# Patient Record
Sex: Female | Born: 1935 | Race: White | Hispanic: No | Smoking: Former smoker
Health system: Southern US, Community
[De-identification: ages and names within clinical notes are randomized; demographics above are authoritative.]

## PROBLEM LIST (undated history)

## (undated) DIAGNOSIS — E039 Hypothyroidism, unspecified: Secondary | ICD-10-CM

## (undated) DIAGNOSIS — M1990 Unspecified osteoarthritis, unspecified site: Secondary | ICD-10-CM

## (undated) DIAGNOSIS — C801 Malignant (primary) neoplasm, unspecified: Secondary | ICD-10-CM

## (undated) DIAGNOSIS — K219 Gastro-esophageal reflux disease without esophagitis: Secondary | ICD-10-CM

## (undated) DIAGNOSIS — E079 Disorder of thyroid, unspecified: Secondary | ICD-10-CM

## (undated) DIAGNOSIS — F32A Depression, unspecified: Secondary | ICD-10-CM

## (undated) DIAGNOSIS — I10 Essential (primary) hypertension: Secondary | ICD-10-CM

## (undated) DIAGNOSIS — Z972 Presence of dental prosthetic device (complete) (partial): Secondary | ICD-10-CM

## (undated) DIAGNOSIS — R519 Headache, unspecified: Secondary | ICD-10-CM

## (undated) DIAGNOSIS — F419 Anxiety disorder, unspecified: Secondary | ICD-10-CM

## (undated) DIAGNOSIS — I4891 Unspecified atrial fibrillation: Secondary | ICD-10-CM

## (undated) DIAGNOSIS — R51 Headache: Secondary | ICD-10-CM

## (undated) DIAGNOSIS — F329 Major depressive disorder, single episode, unspecified: Secondary | ICD-10-CM

## (undated) HISTORY — DX: Depression, unspecified: F32.A

## (undated) HISTORY — PX: MASTECTOMY: SHX3

## (undated) HISTORY — PX: TOTAL KNEE ARTHROPLASTY: SHX125

## (undated) HISTORY — PX: VAGINAL HYSTERECTOMY: SUR661

## (undated) HISTORY — DX: Gastro-esophageal reflux disease without esophagitis: K21.9

## (undated) HISTORY — PX: TONSILLECTOMY: SUR1361

## (undated) HISTORY — PX: CHOLECYSTECTOMY: SHX55

## (undated) HISTORY — PX: ROTATOR CUFF REPAIR: SHX139

## (undated) HISTORY — DX: Major depressive disorder, single episode, unspecified: F32.9

## (undated) HISTORY — DX: Disorder of thyroid, unspecified: E07.9

## (undated) HISTORY — PX: CATARACT EXTRACTION: SUR2

---

## 1989-03-14 DIAGNOSIS — C801 Malignant (primary) neoplasm, unspecified: Secondary | ICD-10-CM

## 1989-03-14 HISTORY — DX: Malignant (primary) neoplasm, unspecified: C80.1

## 1989-03-14 HISTORY — PX: BREAST SURGERY: SHX581

## 1997-05-05 ENCOUNTER — Ambulatory Visit: Admission: RE | Admit: 1997-05-05 | Discharge: 1997-05-05 | Payer: Self-pay | Admitting: Obstetrics & Gynecology

## 2005-07-25 ENCOUNTER — Ambulatory Visit: Payer: Self-pay | Admitting: Gastroenterology

## 2006-04-18 ENCOUNTER — Ambulatory Visit: Payer: Self-pay | Admitting: Gastroenterology

## 2006-06-22 ENCOUNTER — Ambulatory Visit: Payer: Self-pay | Admitting: Gastroenterology

## 2006-11-01 ENCOUNTER — Ambulatory Visit: Payer: Self-pay | Admitting: Family Medicine

## 2007-10-15 ENCOUNTER — Ambulatory Visit: Payer: Self-pay | Admitting: Gastroenterology

## 2007-10-24 ENCOUNTER — Ambulatory Visit: Payer: Self-pay | Admitting: Unknown Physician Specialty

## 2007-11-01 ENCOUNTER — Ambulatory Visit: Payer: Self-pay

## 2007-11-01 ENCOUNTER — Other Ambulatory Visit: Payer: Self-pay

## 2009-08-13 ENCOUNTER — Ambulatory Visit: Payer: Self-pay | Admitting: Family Medicine

## 2009-09-16 ENCOUNTER — Ambulatory Visit: Payer: Self-pay | Admitting: Family Medicine

## 2009-09-17 ENCOUNTER — Ambulatory Visit: Payer: Self-pay | Admitting: Surgery

## 2009-09-18 ENCOUNTER — Inpatient Hospital Stay: Payer: Self-pay | Admitting: Surgery

## 2009-09-22 LAB — PATHOLOGY REPORT

## 2010-01-25 ENCOUNTER — Ambulatory Visit: Payer: Self-pay

## 2010-02-02 ENCOUNTER — Ambulatory Visit: Payer: Self-pay | Admitting: Unknown Physician Specialty

## 2010-06-20 ENCOUNTER — Ambulatory Visit: Payer: Self-pay | Admitting: Family Medicine

## 2010-08-30 ENCOUNTER — Ambulatory Visit: Payer: Self-pay | Admitting: Family Medicine

## 2011-03-15 HISTORY — PX: COLONOSCOPY: SHX174

## 2011-03-18 ENCOUNTER — Ambulatory Visit: Payer: Self-pay | Admitting: Unknown Physician Specialty

## 2011-03-18 DIAGNOSIS — M5020 Other cervical disc displacement, unspecified cervical region: Secondary | ICD-10-CM | POA: Diagnosis not present

## 2011-03-18 DIAGNOSIS — M47812 Spondylosis without myelopathy or radiculopathy, cervical region: Secondary | ICD-10-CM | POA: Diagnosis not present

## 2011-03-18 DIAGNOSIS — M5030 Other cervical disc degeneration, unspecified cervical region: Secondary | ICD-10-CM | POA: Diagnosis not present

## 2011-03-21 DIAGNOSIS — M542 Cervicalgia: Secondary | ICD-10-CM | POA: Diagnosis not present

## 2011-03-23 DIAGNOSIS — M6281 Muscle weakness (generalized): Secondary | ICD-10-CM | POA: Diagnosis not present

## 2011-03-23 DIAGNOSIS — M542 Cervicalgia: Secondary | ICD-10-CM | POA: Diagnosis not present

## 2011-03-25 DIAGNOSIS — M542 Cervicalgia: Secondary | ICD-10-CM | POA: Diagnosis not present

## 2011-03-28 DIAGNOSIS — M542 Cervicalgia: Secondary | ICD-10-CM | POA: Diagnosis not present

## 2011-03-30 DIAGNOSIS — M542 Cervicalgia: Secondary | ICD-10-CM | POA: Diagnosis not present

## 2011-04-04 DIAGNOSIS — M542 Cervicalgia: Secondary | ICD-10-CM | POA: Diagnosis not present

## 2011-04-07 DIAGNOSIS — M6281 Muscle weakness (generalized): Secondary | ICD-10-CM | POA: Diagnosis not present

## 2011-04-07 DIAGNOSIS — M542 Cervicalgia: Secondary | ICD-10-CM | POA: Diagnosis not present

## 2011-04-08 DIAGNOSIS — I10 Essential (primary) hypertension: Secondary | ICD-10-CM | POA: Diagnosis not present

## 2011-04-08 DIAGNOSIS — K2970 Gastritis, unspecified, without bleeding: Secondary | ICD-10-CM | POA: Diagnosis not present

## 2011-04-11 DIAGNOSIS — M6281 Muscle weakness (generalized): Secondary | ICD-10-CM | POA: Diagnosis not present

## 2011-04-11 DIAGNOSIS — M542 Cervicalgia: Secondary | ICD-10-CM | POA: Diagnosis not present

## 2011-04-14 DIAGNOSIS — M6281 Muscle weakness (generalized): Secondary | ICD-10-CM | POA: Diagnosis not present

## 2011-04-14 DIAGNOSIS — M542 Cervicalgia: Secondary | ICD-10-CM | POA: Diagnosis not present

## 2011-04-19 DIAGNOSIS — M542 Cervicalgia: Secondary | ICD-10-CM | POA: Diagnosis not present

## 2011-04-19 DIAGNOSIS — M6281 Muscle weakness (generalized): Secondary | ICD-10-CM | POA: Diagnosis not present

## 2011-04-20 DIAGNOSIS — L570 Actinic keratosis: Secondary | ICD-10-CM | POA: Diagnosis not present

## 2011-04-20 DIAGNOSIS — B079 Viral wart, unspecified: Secondary | ICD-10-CM | POA: Diagnosis not present

## 2011-04-20 DIAGNOSIS — D485 Neoplasm of uncertain behavior of skin: Secondary | ICD-10-CM | POA: Diagnosis not present

## 2011-04-20 DIAGNOSIS — L820 Inflamed seborrheic keratosis: Secondary | ICD-10-CM | POA: Diagnosis not present

## 2011-04-25 DIAGNOSIS — Z961 Presence of intraocular lens: Secondary | ICD-10-CM | POA: Diagnosis not present

## 2011-06-08 DIAGNOSIS — M79609 Pain in unspecified limb: Secondary | ICD-10-CM | POA: Diagnosis not present

## 2011-06-08 DIAGNOSIS — M2010 Hallux valgus (acquired), unspecified foot: Secondary | ICD-10-CM | POA: Diagnosis not present

## 2011-07-25 DIAGNOSIS — IMO0002 Reserved for concepts with insufficient information to code with codable children: Secondary | ICD-10-CM | POA: Diagnosis not present

## 2011-07-25 DIAGNOSIS — M1710 Unilateral primary osteoarthritis, unspecified knee: Secondary | ICD-10-CM | POA: Diagnosis not present

## 2011-08-04 ENCOUNTER — Ambulatory Visit: Payer: Self-pay | Admitting: Orthopedic Surgery

## 2011-08-04 DIAGNOSIS — M25569 Pain in unspecified knee: Secondary | ICD-10-CM | POA: Diagnosis not present

## 2011-09-02 DIAGNOSIS — J019 Acute sinusitis, unspecified: Secondary | ICD-10-CM | POA: Diagnosis not present

## 2011-09-05 ENCOUNTER — Ambulatory Visit: Payer: Self-pay | Admitting: Orthopedic Surgery

## 2011-09-05 DIAGNOSIS — I119 Hypertensive heart disease without heart failure: Secondary | ICD-10-CM | POA: Diagnosis not present

## 2011-09-05 LAB — CBC
HCT: 39.6 % (ref 35.0–47.0)
HGB: 13.4 g/dL (ref 12.0–16.0)
MCH: 29.4 pg (ref 26.0–34.0)
MCV: 87 fL (ref 80–100)
Platelet: 154 10*3/uL (ref 150–440)
RBC: 4.55 10*6/uL (ref 3.80–5.20)
RDW: 15.4 % — ABNORMAL HIGH (ref 11.5–14.5)
WBC: 6.5 10*3/uL (ref 3.6–11.0)

## 2011-09-05 LAB — BASIC METABOLIC PANEL
BUN: 21 mg/dL — ABNORMAL HIGH (ref 7–18)
Chloride: 102 mmol/L (ref 98–107)
Co2: 29 mmol/L (ref 21–32)
Creatinine: 0.98 mg/dL (ref 0.60–1.30)
EGFR (African American): >60
EGFR (Non-African Amer.): 56 — ABNORMAL LOW
Osmolality: 277 (ref 275–301)
Potassium: 3.8 mmol/L (ref 3.5–5.1)

## 2011-09-05 LAB — SEDIMENTATION RATE: Erythrocyte Sed Rate: 6 mm/hr (ref 0–30)

## 2011-09-08 ENCOUNTER — Inpatient Hospital Stay: Payer: Self-pay

## 2011-09-08 DIAGNOSIS — G8918 Other acute postprocedural pain: Secondary | ICD-10-CM | POA: Diagnosis not present

## 2011-09-08 DIAGNOSIS — K279 Peptic ulcer, site unspecified, unspecified as acute or chronic, without hemorrhage or perforation: Secondary | ICD-10-CM | POA: Diagnosis present

## 2011-09-08 DIAGNOSIS — J309 Allergic rhinitis, unspecified: Secondary | ICD-10-CM | POA: Diagnosis present

## 2011-09-08 DIAGNOSIS — Z803 Family history of malignant neoplasm of breast: Secondary | ICD-10-CM | POA: Diagnosis not present

## 2011-09-08 DIAGNOSIS — Z96659 Presence of unspecified artificial knee joint: Secondary | ICD-10-CM | POA: Diagnosis not present

## 2011-09-08 DIAGNOSIS — R011 Cardiac murmur, unspecified: Secondary | ICD-10-CM | POA: Diagnosis present

## 2011-09-08 DIAGNOSIS — M25569 Pain in unspecified knee: Secondary | ICD-10-CM | POA: Diagnosis not present

## 2011-09-08 DIAGNOSIS — Z886 Allergy status to analgesic agent status: Secondary | ICD-10-CM | POA: Diagnosis not present

## 2011-09-08 DIAGNOSIS — Z79899 Other long term (current) drug therapy: Secondary | ICD-10-CM | POA: Diagnosis not present

## 2011-09-08 DIAGNOSIS — Z87891 Personal history of nicotine dependence: Secondary | ICD-10-CM | POA: Diagnosis not present

## 2011-09-08 DIAGNOSIS — M19079 Primary osteoarthritis, unspecified ankle and foot: Secondary | ICD-10-CM | POA: Diagnosis not present

## 2011-09-08 DIAGNOSIS — Z881 Allergy status to other antibiotic agents status: Secondary | ICD-10-CM | POA: Diagnosis not present

## 2011-09-08 DIAGNOSIS — M7120 Synovial cyst of popliteal space [Baker], unspecified knee: Secondary | ICD-10-CM | POA: Diagnosis present

## 2011-09-08 DIAGNOSIS — Z853 Personal history of malignant neoplasm of breast: Secondary | ICD-10-CM | POA: Diagnosis not present

## 2011-09-08 DIAGNOSIS — M1710 Unilateral primary osteoarthritis, unspecified knee: Secondary | ICD-10-CM | POA: Diagnosis present

## 2011-09-08 DIAGNOSIS — Z880 Allergy status to penicillin: Secondary | ICD-10-CM | POA: Diagnosis not present

## 2011-09-08 DIAGNOSIS — K2970 Gastritis, unspecified, without bleeding: Secondary | ICD-10-CM | POA: Diagnosis present

## 2011-09-08 DIAGNOSIS — E669 Obesity, unspecified: Secondary | ICD-10-CM | POA: Diagnosis present

## 2011-09-08 DIAGNOSIS — Z8249 Family history of ischemic heart disease and other diseases of the circulatory system: Secondary | ICD-10-CM | POA: Diagnosis not present

## 2011-09-08 DIAGNOSIS — Z888 Allergy status to other drugs, medicaments and biological substances status: Secondary | ICD-10-CM | POA: Diagnosis not present

## 2011-09-08 DIAGNOSIS — Z471 Aftercare following joint replacement surgery: Secondary | ICD-10-CM | POA: Diagnosis not present

## 2011-09-08 DIAGNOSIS — IMO0002 Reserved for concepts with insufficient information to code with codable children: Secondary | ICD-10-CM | POA: Diagnosis not present

## 2011-09-08 DIAGNOSIS — D62 Acute posthemorrhagic anemia: Secondary | ICD-10-CM | POA: Diagnosis not present

## 2011-09-08 DIAGNOSIS — M1990 Unspecified osteoarthritis, unspecified site: Secondary | ICD-10-CM | POA: Diagnosis not present

## 2011-09-08 DIAGNOSIS — E785 Hyperlipidemia, unspecified: Secondary | ICD-10-CM | POA: Diagnosis present

## 2011-09-08 DIAGNOSIS — I10 Essential (primary) hypertension: Secondary | ICD-10-CM | POA: Diagnosis present

## 2011-09-08 DIAGNOSIS — D369 Benign neoplasm, unspecified site: Secondary | ICD-10-CM | POA: Diagnosis present

## 2011-09-09 LAB — BASIC METABOLIC PANEL
Anion Gap: 4 — ABNORMAL LOW (ref 7–16)
Calcium, Total: 8.7 mg/dL (ref 8.5–10.1)
Co2: 30 mmol/L (ref 21–32)
Creatinine: 1.05 mg/dL (ref 0.60–1.30)
EGFR (Non-African Amer.): 52 — ABNORMAL LOW
Osmolality: 283 (ref 275–301)
Sodium: 141 mmol/L (ref 136–145)

## 2011-09-09 LAB — PLATELET COUNT: Platelet: 119 10*3/uL — ABNORMAL LOW (ref 150–440)

## 2011-09-09 LAB — HEMOGLOBIN: HGB: 11 g/dL — ABNORMAL LOW (ref 12.0–16.0)

## 2011-09-12 DIAGNOSIS — Z471 Aftercare following joint replacement surgery: Secondary | ICD-10-CM | POA: Diagnosis not present

## 2011-09-12 DIAGNOSIS — R609 Edema, unspecified: Secondary | ICD-10-CM | POA: Diagnosis not present

## 2011-09-12 DIAGNOSIS — IMO0001 Reserved for inherently not codable concepts without codable children: Secondary | ICD-10-CM | POA: Diagnosis not present

## 2011-09-12 DIAGNOSIS — I10 Essential (primary) hypertension: Secondary | ICD-10-CM | POA: Diagnosis not present

## 2011-09-12 DIAGNOSIS — Z7901 Long term (current) use of anticoagulants: Secondary | ICD-10-CM | POA: Diagnosis not present

## 2011-09-12 LAB — PATHOLOGY REPORT

## 2011-09-13 DIAGNOSIS — R609 Edema, unspecified: Secondary | ICD-10-CM | POA: Diagnosis not present

## 2011-09-13 DIAGNOSIS — Z471 Aftercare following joint replacement surgery: Secondary | ICD-10-CM | POA: Diagnosis not present

## 2011-09-13 DIAGNOSIS — I10 Essential (primary) hypertension: Secondary | ICD-10-CM | POA: Diagnosis not present

## 2011-09-13 DIAGNOSIS — IMO0001 Reserved for inherently not codable concepts without codable children: Secondary | ICD-10-CM | POA: Diagnosis not present

## 2011-09-13 DIAGNOSIS — Z7901 Long term (current) use of anticoagulants: Secondary | ICD-10-CM | POA: Diagnosis not present

## 2011-09-14 DIAGNOSIS — IMO0001 Reserved for inherently not codable concepts without codable children: Secondary | ICD-10-CM | POA: Diagnosis not present

## 2011-09-14 DIAGNOSIS — R609 Edema, unspecified: Secondary | ICD-10-CM | POA: Diagnosis not present

## 2011-09-14 DIAGNOSIS — Z471 Aftercare following joint replacement surgery: Secondary | ICD-10-CM | POA: Diagnosis not present

## 2011-09-14 DIAGNOSIS — I10 Essential (primary) hypertension: Secondary | ICD-10-CM | POA: Diagnosis not present

## 2011-09-14 DIAGNOSIS — Z7901 Long term (current) use of anticoagulants: Secondary | ICD-10-CM | POA: Diagnosis not present

## 2011-09-16 DIAGNOSIS — I10 Essential (primary) hypertension: Secondary | ICD-10-CM | POA: Diagnosis not present

## 2011-09-16 DIAGNOSIS — Z471 Aftercare following joint replacement surgery: Secondary | ICD-10-CM | POA: Diagnosis not present

## 2011-09-16 DIAGNOSIS — R609 Edema, unspecified: Secondary | ICD-10-CM | POA: Diagnosis not present

## 2011-09-16 DIAGNOSIS — Z7901 Long term (current) use of anticoagulants: Secondary | ICD-10-CM | POA: Diagnosis not present

## 2011-09-16 DIAGNOSIS — IMO0001 Reserved for inherently not codable concepts without codable children: Secondary | ICD-10-CM | POA: Diagnosis not present

## 2011-09-19 DIAGNOSIS — R609 Edema, unspecified: Secondary | ICD-10-CM | POA: Diagnosis not present

## 2011-09-19 DIAGNOSIS — Z471 Aftercare following joint replacement surgery: Secondary | ICD-10-CM | POA: Diagnosis not present

## 2011-09-19 DIAGNOSIS — IMO0001 Reserved for inherently not codable concepts without codable children: Secondary | ICD-10-CM | POA: Diagnosis not present

## 2011-09-19 DIAGNOSIS — I10 Essential (primary) hypertension: Secondary | ICD-10-CM | POA: Diagnosis not present

## 2011-09-19 DIAGNOSIS — Z7901 Long term (current) use of anticoagulants: Secondary | ICD-10-CM | POA: Diagnosis not present

## 2011-09-20 DIAGNOSIS — R609 Edema, unspecified: Secondary | ICD-10-CM | POA: Diagnosis not present

## 2011-09-20 DIAGNOSIS — Z7901 Long term (current) use of anticoagulants: Secondary | ICD-10-CM | POA: Diagnosis not present

## 2011-09-20 DIAGNOSIS — Z471 Aftercare following joint replacement surgery: Secondary | ICD-10-CM | POA: Diagnosis not present

## 2011-09-20 DIAGNOSIS — I10 Essential (primary) hypertension: Secondary | ICD-10-CM | POA: Diagnosis not present

## 2011-09-20 DIAGNOSIS — IMO0001 Reserved for inherently not codable concepts without codable children: Secondary | ICD-10-CM | POA: Diagnosis not present

## 2011-09-21 DIAGNOSIS — I10 Essential (primary) hypertension: Secondary | ICD-10-CM | POA: Diagnosis not present

## 2011-09-21 DIAGNOSIS — R609 Edema, unspecified: Secondary | ICD-10-CM | POA: Diagnosis not present

## 2011-09-21 DIAGNOSIS — Z471 Aftercare following joint replacement surgery: Secondary | ICD-10-CM | POA: Diagnosis not present

## 2011-09-21 DIAGNOSIS — Z7901 Long term (current) use of anticoagulants: Secondary | ICD-10-CM | POA: Diagnosis not present

## 2011-09-21 DIAGNOSIS — IMO0001 Reserved for inherently not codable concepts without codable children: Secondary | ICD-10-CM | POA: Diagnosis not present

## 2011-09-22 DIAGNOSIS — Z471 Aftercare following joint replacement surgery: Secondary | ICD-10-CM | POA: Diagnosis not present

## 2011-09-22 DIAGNOSIS — R609 Edema, unspecified: Secondary | ICD-10-CM | POA: Diagnosis not present

## 2011-09-22 DIAGNOSIS — IMO0001 Reserved for inherently not codable concepts without codable children: Secondary | ICD-10-CM | POA: Diagnosis not present

## 2011-09-22 DIAGNOSIS — Z7901 Long term (current) use of anticoagulants: Secondary | ICD-10-CM | POA: Diagnosis not present

## 2011-09-22 DIAGNOSIS — I10 Essential (primary) hypertension: Secondary | ICD-10-CM | POA: Diagnosis not present

## 2011-09-23 DIAGNOSIS — IMO0002 Reserved for concepts with insufficient information to code with codable children: Secondary | ICD-10-CM | POA: Diagnosis not present

## 2011-09-23 DIAGNOSIS — M1710 Unilateral primary osteoarthritis, unspecified knee: Secondary | ICD-10-CM | POA: Diagnosis not present

## 2011-09-26 DIAGNOSIS — M25669 Stiffness of unspecified knee, not elsewhere classified: Secondary | ICD-10-CM | POA: Diagnosis not present

## 2011-09-26 DIAGNOSIS — M6281 Muscle weakness (generalized): Secondary | ICD-10-CM | POA: Diagnosis not present

## 2011-09-26 DIAGNOSIS — Z96659 Presence of unspecified artificial knee joint: Secondary | ICD-10-CM | POA: Diagnosis not present

## 2011-09-26 DIAGNOSIS — M25569 Pain in unspecified knee: Secondary | ICD-10-CM | POA: Diagnosis not present

## 2011-09-27 DIAGNOSIS — M6281 Muscle weakness (generalized): Secondary | ICD-10-CM | POA: Diagnosis not present

## 2011-09-27 DIAGNOSIS — Z96659 Presence of unspecified artificial knee joint: Secondary | ICD-10-CM | POA: Diagnosis not present

## 2011-09-27 DIAGNOSIS — M25569 Pain in unspecified knee: Secondary | ICD-10-CM | POA: Diagnosis not present

## 2011-09-27 DIAGNOSIS — M25669 Stiffness of unspecified knee, not elsewhere classified: Secondary | ICD-10-CM | POA: Diagnosis not present

## 2011-09-29 DIAGNOSIS — M25669 Stiffness of unspecified knee, not elsewhere classified: Secondary | ICD-10-CM | POA: Diagnosis not present

## 2011-09-29 DIAGNOSIS — M6281 Muscle weakness (generalized): Secondary | ICD-10-CM | POA: Diagnosis not present

## 2011-09-29 DIAGNOSIS — Z96659 Presence of unspecified artificial knee joint: Secondary | ICD-10-CM | POA: Diagnosis not present

## 2011-09-29 DIAGNOSIS — M25569 Pain in unspecified knee: Secondary | ICD-10-CM | POA: Diagnosis not present

## 2011-10-03 DIAGNOSIS — M6281 Muscle weakness (generalized): Secondary | ICD-10-CM | POA: Diagnosis not present

## 2011-10-03 DIAGNOSIS — M25569 Pain in unspecified knee: Secondary | ICD-10-CM | POA: Diagnosis not present

## 2011-10-03 DIAGNOSIS — M25669 Stiffness of unspecified knee, not elsewhere classified: Secondary | ICD-10-CM | POA: Diagnosis not present

## 2011-10-03 DIAGNOSIS — Z96659 Presence of unspecified artificial knee joint: Secondary | ICD-10-CM | POA: Diagnosis not present

## 2011-10-05 DIAGNOSIS — M25669 Stiffness of unspecified knee, not elsewhere classified: Secondary | ICD-10-CM | POA: Diagnosis not present

## 2011-10-05 DIAGNOSIS — M6281 Muscle weakness (generalized): Secondary | ICD-10-CM | POA: Diagnosis not present

## 2011-10-05 DIAGNOSIS — M25569 Pain in unspecified knee: Secondary | ICD-10-CM | POA: Diagnosis not present

## 2011-10-05 DIAGNOSIS — Z96659 Presence of unspecified artificial knee joint: Secondary | ICD-10-CM | POA: Diagnosis not present

## 2011-10-10 DIAGNOSIS — Z96659 Presence of unspecified artificial knee joint: Secondary | ICD-10-CM | POA: Diagnosis not present

## 2011-10-10 DIAGNOSIS — M25569 Pain in unspecified knee: Secondary | ICD-10-CM | POA: Diagnosis not present

## 2011-10-10 DIAGNOSIS — M6281 Muscle weakness (generalized): Secondary | ICD-10-CM | POA: Diagnosis not present

## 2011-10-10 DIAGNOSIS — M25669 Stiffness of unspecified knee, not elsewhere classified: Secondary | ICD-10-CM | POA: Diagnosis not present

## 2011-10-13 DIAGNOSIS — M6281 Muscle weakness (generalized): Secondary | ICD-10-CM | POA: Diagnosis not present

## 2011-10-13 DIAGNOSIS — M25669 Stiffness of unspecified knee, not elsewhere classified: Secondary | ICD-10-CM | POA: Diagnosis not present

## 2011-10-13 DIAGNOSIS — M25569 Pain in unspecified knee: Secondary | ICD-10-CM | POA: Diagnosis not present

## 2011-10-13 DIAGNOSIS — Z96659 Presence of unspecified artificial knee joint: Secondary | ICD-10-CM | POA: Diagnosis not present

## 2011-12-02 DIAGNOSIS — R51 Headache: Secondary | ICD-10-CM | POA: Diagnosis not present

## 2011-12-02 DIAGNOSIS — J301 Allergic rhinitis due to pollen: Secondary | ICD-10-CM | POA: Diagnosis not present

## 2011-12-02 DIAGNOSIS — R439 Unspecified disturbances of smell and taste: Secondary | ICD-10-CM | POA: Diagnosis not present

## 2011-12-09 ENCOUNTER — Ambulatory Visit: Payer: Self-pay | Admitting: Otolaryngology

## 2011-12-09 DIAGNOSIS — R439 Unspecified disturbances of smell and taste: Secondary | ICD-10-CM | POA: Diagnosis not present

## 2011-12-09 LAB — CREATININE, SERUM
EGFR (African American): 58 — ABNORMAL LOW
EGFR (Non-African Amer.): 50 — ABNORMAL LOW

## 2011-12-29 DIAGNOSIS — Z23 Encounter for immunization: Secondary | ICD-10-CM | POA: Diagnosis not present

## 2012-01-03 DIAGNOSIS — K2270 Barrett's esophagus without dysplasia: Secondary | ICD-10-CM | POA: Diagnosis not present

## 2012-01-03 DIAGNOSIS — Z86010 Personal history of colonic polyps: Secondary | ICD-10-CM | POA: Diagnosis not present

## 2012-02-06 DIAGNOSIS — E785 Hyperlipidemia, unspecified: Secondary | ICD-10-CM | POA: Diagnosis not present

## 2012-02-06 DIAGNOSIS — L659 Nonscarring hair loss, unspecified: Secondary | ICD-10-CM | POA: Diagnosis not present

## 2012-02-06 DIAGNOSIS — J019 Acute sinusitis, unspecified: Secondary | ICD-10-CM | POA: Diagnosis not present

## 2012-02-06 DIAGNOSIS — I10 Essential (primary) hypertension: Secondary | ICD-10-CM | POA: Diagnosis not present

## 2012-02-24 ENCOUNTER — Ambulatory Visit: Payer: Self-pay | Admitting: Gastroenterology

## 2012-02-24 DIAGNOSIS — Z7982 Long term (current) use of aspirin: Secondary | ICD-10-CM | POA: Diagnosis not present

## 2012-02-24 DIAGNOSIS — Z79899 Other long term (current) drug therapy: Secondary | ICD-10-CM | POA: Diagnosis not present

## 2012-02-24 DIAGNOSIS — K219 Gastro-esophageal reflux disease without esophagitis: Secondary | ICD-10-CM | POA: Diagnosis not present

## 2012-02-24 DIAGNOSIS — Q438 Other specified congenital malformations of intestine: Secondary | ICD-10-CM | POA: Diagnosis not present

## 2012-02-24 DIAGNOSIS — K2960 Other gastritis without bleeding: Secondary | ICD-10-CM | POA: Diagnosis not present

## 2012-02-24 DIAGNOSIS — Z96659 Presence of unspecified artificial knee joint: Secondary | ICD-10-CM | POA: Diagnosis not present

## 2012-02-24 DIAGNOSIS — Z09 Encounter for follow-up examination after completed treatment for conditions other than malignant neoplasm: Secondary | ICD-10-CM | POA: Diagnosis not present

## 2012-02-24 DIAGNOSIS — D126 Benign neoplasm of colon, unspecified: Secondary | ICD-10-CM | POA: Diagnosis not present

## 2012-02-24 DIAGNOSIS — Z880 Allergy status to penicillin: Secondary | ICD-10-CM | POA: Diagnosis not present

## 2012-02-24 DIAGNOSIS — Z886 Allergy status to analgesic agent status: Secondary | ICD-10-CM | POA: Diagnosis not present

## 2012-02-24 DIAGNOSIS — K562 Volvulus: Secondary | ICD-10-CM | POA: Diagnosis not present

## 2012-02-24 DIAGNOSIS — Z86010 Personal history of colonic polyps: Secondary | ICD-10-CM | POA: Diagnosis not present

## 2012-02-24 DIAGNOSIS — K2940 Chronic atrophic gastritis without bleeding: Secondary | ICD-10-CM | POA: Diagnosis not present

## 2012-02-24 DIAGNOSIS — K5730 Diverticulosis of large intestine without perforation or abscess without bleeding: Secondary | ICD-10-CM | POA: Diagnosis not present

## 2012-02-24 DIAGNOSIS — I10 Essential (primary) hypertension: Secondary | ICD-10-CM | POA: Diagnosis not present

## 2012-02-24 DIAGNOSIS — K2270 Barrett's esophagus without dysplasia: Secondary | ICD-10-CM | POA: Diagnosis not present

## 2012-02-24 LAB — HM COLONOSCOPY

## 2012-02-27 LAB — PATHOLOGY REPORT

## 2012-04-05 DIAGNOSIS — D126 Benign neoplasm of colon, unspecified: Secondary | ICD-10-CM | POA: Diagnosis not present

## 2012-04-05 DIAGNOSIS — K2270 Barrett's esophagus without dysplasia: Secondary | ICD-10-CM | POA: Diagnosis not present

## 2012-05-14 DIAGNOSIS — Z96659 Presence of unspecified artificial knee joint: Secondary | ICD-10-CM | POA: Diagnosis not present

## 2012-07-31 DIAGNOSIS — R1013 Epigastric pain: Secondary | ICD-10-CM | POA: Diagnosis not present

## 2012-07-31 DIAGNOSIS — K2270 Barrett's esophagus without dysplasia: Secondary | ICD-10-CM | POA: Diagnosis not present

## 2012-07-31 DIAGNOSIS — J0100 Acute maxillary sinusitis, unspecified: Secondary | ICD-10-CM | POA: Diagnosis not present

## 2012-08-23 DIAGNOSIS — E785 Hyperlipidemia, unspecified: Secondary | ICD-10-CM | POA: Diagnosis not present

## 2012-12-12 DIAGNOSIS — E785 Hyperlipidemia, unspecified: Secondary | ICD-10-CM | POA: Diagnosis not present

## 2012-12-12 DIAGNOSIS — Z23 Encounter for immunization: Secondary | ICD-10-CM | POA: Diagnosis not present

## 2013-01-30 DIAGNOSIS — L906 Striae atrophicae: Secondary | ICD-10-CM | POA: Diagnosis not present

## 2013-01-30 DIAGNOSIS — L570 Actinic keratosis: Secondary | ICD-10-CM | POA: Diagnosis not present

## 2013-01-30 DIAGNOSIS — Z1283 Encounter for screening for malignant neoplasm of skin: Secondary | ICD-10-CM | POA: Diagnosis not present

## 2013-01-30 DIAGNOSIS — L909 Atrophic disorder of skin, unspecified: Secondary | ICD-10-CM | POA: Diagnosis not present

## 2013-01-30 DIAGNOSIS — D214 Benign neoplasm of connective and other soft tissue of abdomen: Secondary | ICD-10-CM | POA: Diagnosis not present

## 2013-06-25 DIAGNOSIS — E785 Hyperlipidemia, unspecified: Secondary | ICD-10-CM | POA: Diagnosis not present

## 2013-07-01 DIAGNOSIS — J209 Acute bronchitis, unspecified: Secondary | ICD-10-CM | POA: Diagnosis not present

## 2013-07-01 DIAGNOSIS — B3789 Other sites of candidiasis: Secondary | ICD-10-CM | POA: Diagnosis not present

## 2013-07-01 DIAGNOSIS — J019 Acute sinusitis, unspecified: Secondary | ICD-10-CM | POA: Diagnosis not present

## 2013-08-09 DIAGNOSIS — Z0000 Encounter for general adult medical examination without abnormal findings: Secondary | ICD-10-CM | POA: Diagnosis not present

## 2013-08-13 DIAGNOSIS — M955 Acquired deformity of pelvis: Secondary | ICD-10-CM | POA: Diagnosis not present

## 2013-08-13 DIAGNOSIS — IMO0002 Reserved for concepts with insufficient information to code with codable children: Secondary | ICD-10-CM | POA: Diagnosis not present

## 2013-08-13 DIAGNOSIS — M999 Biomechanical lesion, unspecified: Secondary | ICD-10-CM | POA: Diagnosis not present

## 2013-08-16 DIAGNOSIS — M955 Acquired deformity of pelvis: Secondary | ICD-10-CM | POA: Diagnosis not present

## 2013-08-16 DIAGNOSIS — M999 Biomechanical lesion, unspecified: Secondary | ICD-10-CM | POA: Diagnosis not present

## 2013-08-16 DIAGNOSIS — IMO0002 Reserved for concepts with insufficient information to code with codable children: Secondary | ICD-10-CM | POA: Diagnosis not present

## 2013-08-19 DIAGNOSIS — M955 Acquired deformity of pelvis: Secondary | ICD-10-CM | POA: Diagnosis not present

## 2013-08-19 DIAGNOSIS — IMO0002 Reserved for concepts with insufficient information to code with codable children: Secondary | ICD-10-CM | POA: Diagnosis not present

## 2013-08-19 DIAGNOSIS — M999 Biomechanical lesion, unspecified: Secondary | ICD-10-CM | POA: Diagnosis not present

## 2013-08-21 DIAGNOSIS — M999 Biomechanical lesion, unspecified: Secondary | ICD-10-CM | POA: Diagnosis not present

## 2013-08-21 DIAGNOSIS — M955 Acquired deformity of pelvis: Secondary | ICD-10-CM | POA: Diagnosis not present

## 2013-08-21 DIAGNOSIS — IMO0002 Reserved for concepts with insufficient information to code with codable children: Secondary | ICD-10-CM | POA: Diagnosis not present

## 2013-08-23 DIAGNOSIS — M999 Biomechanical lesion, unspecified: Secondary | ICD-10-CM | POA: Diagnosis not present

## 2013-08-23 DIAGNOSIS — IMO0002 Reserved for concepts with insufficient information to code with codable children: Secondary | ICD-10-CM | POA: Diagnosis not present

## 2013-08-23 DIAGNOSIS — M955 Acquired deformity of pelvis: Secondary | ICD-10-CM | POA: Diagnosis not present

## 2013-09-03 DIAGNOSIS — IMO0002 Reserved for concepts with insufficient information to code with codable children: Secondary | ICD-10-CM | POA: Diagnosis not present

## 2013-09-03 DIAGNOSIS — M999 Biomechanical lesion, unspecified: Secondary | ICD-10-CM | POA: Diagnosis not present

## 2013-09-03 DIAGNOSIS — M955 Acquired deformity of pelvis: Secondary | ICD-10-CM | POA: Diagnosis not present

## 2013-09-05 DIAGNOSIS — IMO0002 Reserved for concepts with insufficient information to code with codable children: Secondary | ICD-10-CM | POA: Diagnosis not present

## 2013-09-05 DIAGNOSIS — M955 Acquired deformity of pelvis: Secondary | ICD-10-CM | POA: Diagnosis not present

## 2013-09-05 DIAGNOSIS — M999 Biomechanical lesion, unspecified: Secondary | ICD-10-CM | POA: Diagnosis not present

## 2013-09-10 DIAGNOSIS — M955 Acquired deformity of pelvis: Secondary | ICD-10-CM | POA: Diagnosis not present

## 2013-09-10 DIAGNOSIS — M999 Biomechanical lesion, unspecified: Secondary | ICD-10-CM | POA: Diagnosis not present

## 2013-09-10 DIAGNOSIS — IMO0002 Reserved for concepts with insufficient information to code with codable children: Secondary | ICD-10-CM | POA: Diagnosis not present

## 2013-09-12 DIAGNOSIS — M955 Acquired deformity of pelvis: Secondary | ICD-10-CM | POA: Diagnosis not present

## 2013-09-12 DIAGNOSIS — IMO0002 Reserved for concepts with insufficient information to code with codable children: Secondary | ICD-10-CM | POA: Diagnosis not present

## 2013-09-12 DIAGNOSIS — M999 Biomechanical lesion, unspecified: Secondary | ICD-10-CM | POA: Diagnosis not present

## 2013-10-07 DIAGNOSIS — IMO0002 Reserved for concepts with insufficient information to code with codable children: Secondary | ICD-10-CM | POA: Diagnosis not present

## 2013-10-07 DIAGNOSIS — M955 Acquired deformity of pelvis: Secondary | ICD-10-CM | POA: Diagnosis not present

## 2013-10-07 DIAGNOSIS — M999 Biomechanical lesion, unspecified: Secondary | ICD-10-CM | POA: Diagnosis not present

## 2013-10-08 DIAGNOSIS — I10 Essential (primary) hypertension: Secondary | ICD-10-CM | POA: Diagnosis not present

## 2013-10-08 DIAGNOSIS — T733XXA Exhaustion due to excessive exertion, initial encounter: Secondary | ICD-10-CM | POA: Diagnosis not present

## 2013-10-08 DIAGNOSIS — R5381 Other malaise: Secondary | ICD-10-CM | POA: Diagnosis not present

## 2013-10-08 DIAGNOSIS — R011 Cardiac murmur, unspecified: Secondary | ICD-10-CM | POA: Insufficient documentation

## 2013-10-08 DIAGNOSIS — R6889 Other general symptoms and signs: Secondary | ICD-10-CM | POA: Diagnosis not present

## 2013-10-08 DIAGNOSIS — R5383 Other fatigue: Secondary | ICD-10-CM | POA: Diagnosis not present

## 2013-10-08 DIAGNOSIS — E782 Mixed hyperlipidemia: Secondary | ICD-10-CM | POA: Insufficient documentation

## 2013-10-08 DIAGNOSIS — R0609 Other forms of dyspnea: Secondary | ICD-10-CM | POA: Diagnosis not present

## 2013-10-08 DIAGNOSIS — R002 Palpitations: Secondary | ICD-10-CM | POA: Diagnosis not present

## 2013-10-08 DIAGNOSIS — D649 Anemia, unspecified: Secondary | ICD-10-CM | POA: Diagnosis not present

## 2013-10-14 DIAGNOSIS — M955 Acquired deformity of pelvis: Secondary | ICD-10-CM | POA: Diagnosis not present

## 2013-10-14 DIAGNOSIS — M999 Biomechanical lesion, unspecified: Secondary | ICD-10-CM | POA: Diagnosis not present

## 2013-10-14 DIAGNOSIS — IMO0002 Reserved for concepts with insufficient information to code with codable children: Secondary | ICD-10-CM | POA: Diagnosis not present

## 2013-10-22 DIAGNOSIS — I10 Essential (primary) hypertension: Secondary | ICD-10-CM | POA: Diagnosis not present

## 2013-11-11 ENCOUNTER — Ambulatory Visit: Payer: Self-pay | Admitting: Family Medicine

## 2013-11-11 DIAGNOSIS — R51 Headache: Secondary | ICD-10-CM | POA: Diagnosis not present

## 2013-11-11 DIAGNOSIS — I10 Essential (primary) hypertension: Secondary | ICD-10-CM | POA: Diagnosis not present

## 2013-11-11 DIAGNOSIS — K2990 Gastroduodenitis, unspecified, without bleeding: Secondary | ICD-10-CM | POA: Diagnosis not present

## 2013-11-11 DIAGNOSIS — E039 Hypothyroidism, unspecified: Secondary | ICD-10-CM | POA: Diagnosis not present

## 2013-11-11 DIAGNOSIS — M47812 Spondylosis without myelopathy or radiculopathy, cervical region: Secondary | ICD-10-CM | POA: Diagnosis not present

## 2013-11-11 DIAGNOSIS — E785 Hyperlipidemia, unspecified: Secondary | ICD-10-CM | POA: Diagnosis not present

## 2013-11-11 DIAGNOSIS — K2970 Gastritis, unspecified, without bleeding: Secondary | ICD-10-CM | POA: Diagnosis not present

## 2013-12-11 DIAGNOSIS — Z23 Encounter for immunization: Secondary | ICD-10-CM | POA: Diagnosis not present

## 2014-01-07 DIAGNOSIS — H4710 Unspecified papilledema: Secondary | ICD-10-CM | POA: Diagnosis not present

## 2014-01-07 DIAGNOSIS — M5481 Occipital neuralgia: Secondary | ICD-10-CM | POA: Diagnosis not present

## 2014-01-15 DIAGNOSIS — H43813 Vitreous degeneration, bilateral: Secondary | ICD-10-CM | POA: Diagnosis not present

## 2014-01-16 ENCOUNTER — Ambulatory Visit: Payer: Self-pay | Admitting: Neurology

## 2014-01-16 DIAGNOSIS — M792 Neuralgia and neuritis, unspecified: Secondary | ICD-10-CM | POA: Diagnosis not present

## 2014-01-16 DIAGNOSIS — D329 Benign neoplasm of meninges, unspecified: Secondary | ICD-10-CM | POA: Diagnosis not present

## 2014-01-16 DIAGNOSIS — M899 Disorder of bone, unspecified: Secondary | ICD-10-CM | POA: Diagnosis not present

## 2014-01-16 DIAGNOSIS — R51 Headache: Secondary | ICD-10-CM | POA: Diagnosis not present

## 2014-01-16 DIAGNOSIS — R9082 White matter disease, unspecified: Secondary | ICD-10-CM | POA: Diagnosis not present

## 2014-01-16 LAB — CREATININE, SERUM
Creatinine: 1.14 mg/dL (ref 0.60–1.30)
GFR CALC AF AMER: 59 — AB
GFR CALC NON AF AMER: 49 — AB

## 2014-01-24 DIAGNOSIS — R930 Abnormal findings on diagnostic imaging of skull and head, not elsewhere classified: Secondary | ICD-10-CM | POA: Diagnosis not present

## 2014-01-24 DIAGNOSIS — M5481 Occipital neuralgia: Secondary | ICD-10-CM | POA: Diagnosis not present

## 2014-01-29 DIAGNOSIS — M5481 Occipital neuralgia: Secondary | ICD-10-CM | POA: Diagnosis not present

## 2014-02-04 DIAGNOSIS — I6523 Occlusion and stenosis of bilateral carotid arteries: Secondary | ICD-10-CM | POA: Diagnosis not present

## 2014-02-04 DIAGNOSIS — R930 Abnormal findings on diagnostic imaging of skull and head, not elsewhere classified: Secondary | ICD-10-CM | POA: Diagnosis not present

## 2014-02-04 DIAGNOSIS — I10 Essential (primary) hypertension: Secondary | ICD-10-CM | POA: Diagnosis not present

## 2014-02-11 DIAGNOSIS — G5602 Carpal tunnel syndrome, left upper limb: Secondary | ICD-10-CM | POA: Diagnosis not present

## 2014-02-11 DIAGNOSIS — M67432 Ganglion, left wrist: Secondary | ICD-10-CM | POA: Diagnosis not present

## 2014-04-30 DIAGNOSIS — G3184 Mild cognitive impairment, so stated: Secondary | ICD-10-CM | POA: Diagnosis not present

## 2014-04-30 DIAGNOSIS — M5481 Occipital neuralgia: Secondary | ICD-10-CM | POA: Diagnosis not present

## 2014-05-20 DIAGNOSIS — R0789 Other chest pain: Secondary | ICD-10-CM | POA: Diagnosis not present

## 2014-05-21 DIAGNOSIS — R0789 Other chest pain: Secondary | ICD-10-CM | POA: Diagnosis not present

## 2014-05-21 DIAGNOSIS — R011 Cardiac murmur, unspecified: Secondary | ICD-10-CM | POA: Diagnosis not present

## 2014-05-21 DIAGNOSIS — I10 Essential (primary) hypertension: Secondary | ICD-10-CM | POA: Diagnosis not present

## 2014-05-21 DIAGNOSIS — E782 Mixed hyperlipidemia: Secondary | ICD-10-CM | POA: Diagnosis not present

## 2014-06-02 DIAGNOSIS — I10 Essential (primary) hypertension: Secondary | ICD-10-CM | POA: Diagnosis not present

## 2014-06-02 DIAGNOSIS — E782 Mixed hyperlipidemia: Secondary | ICD-10-CM | POA: Diagnosis not present

## 2014-06-02 DIAGNOSIS — R079 Chest pain, unspecified: Secondary | ICD-10-CM | POA: Diagnosis not present

## 2014-06-02 DIAGNOSIS — R0789 Other chest pain: Secondary | ICD-10-CM | POA: Diagnosis not present

## 2014-07-06 NOTE — Discharge Summary (Signed)
PATIENT NAME:  Kaitlyn Oliver, Kaitlyn Oliver MR#:  [id number] DATE OF BIRTH:  08-30-1933  DATE OF ADMISSION:  09/08/2011 DATE OF DISCHARGE:  09/11/2011  ADMITTING DIAGNOSIS: Left knee osteoarthritis.   DISCHARGE DIAGNOSIS: Left knee osteoarthritis.   PROCEDURE: Left total knee replacement.  ANESTHESIA: Spinal.   SURGEON: Laurene Footman, M.D.   ASSISTANT: Rachelle Hora, PA-C.   ESTIMATED BLOOD LOSS: 100 mL.   TOURNIQUET TIME: 59 minutes.   IMPLANTS: Medacta GMK primary femoral component left standard size 3, a fixed left tibial tray size 3, a size 3 12 mm ultra congruent tibial insert with a size 2 cemented resurfacing patella. All components were cemented.   SPECIMEN: Cut ends of bone.  CONDITION: To the recovery room stable. The patient was stabilized, brought to the recovery room, and then brought down to the orthopedic floor where she was treated with physical therapy and for pain control.   HISTORY: Patient is a 81 year old with exhausted nonoperative treatment for her chronic left knee osteoarthritis. She has had severe osteoarthritis. She has had cortisone injections as well as Synvisc injections and exercise programs all without relief. She has debilitating pain in the knee. Prior x-rays showed significant erosions medially as well as lateral osteophytes and profound patellofemoral degenerative change. Her MyKnee CT confirmed this.   PHYSICAL EXAMINATION: GENERAL: Well developed, well nourished female in no apparent distress. Normal affect. HEENT: On examination she was found to have upper full denture and lower partial denture. LUNGS: Clear to auscultation. HEART: Regular rate and rhythm. LEFT KNEE: She has a Baker's cyst. She has good pulses distally. She has range of motion of 0 to 125 degrees. Her valgus deformity is passively correctable. There is marked crepitation to the medial and patellofemoral joint.   HOSPITAL COURSE: After initial admission on 09/08/2011 the patient had surgery  that same day. The patient had good pain control afterwards and was brought to the orthopedic floor from the PAC-U. The patient's vital signs as well as labs were monitored throughout the next two days both of which remained stable. On postoperative day 3, 09/11/2011, patient had progressed well with physical therapy, pain was under control, vital signs and lab work were stable. Patient was ready to go home.   CONDITION AT DISCHARGE: Stable.   DISPOSITION: The patient was sent home with home physical therapy.   DISCHARGE INSTRUCTIONS:  1. Patient will gradually increase weight-bearing on the affected extremity. She will elevate the affected foot and leg on 1 or 2 pillows with the foot higher than the knee.  2. Thigh-high TED hose on both legs and remove at bedtime. Replace on arising the next morning.  3. Patient may resume a regular diet as tolerated.  4. She is to take Xarelto 10 mg every day for the next 32 days. Oxycodone 5 to 10 mg every four hours as needed for pain.  5. Wound care: She will continue using the Polar Care unit maintaining the temperature between 40 and 50 degrees. Do not get the dressing or bandage wet or dirty. She will call [facility] orthopedics if the dressing gets water under it. She is to leave the dressing on. She is to call [facility] if she gets any bright red bleeding from the incision wound, fever above 101.5 degrees, any redness, swelling or drainage at the incision.  6. She was referred for home physical therapy and has been arranged for continuation of her rehab program. She is to call [facility] if therapist has not  contacted her within 48 hours of her return home.  7. She is not to take a shower until staples removed.  8. She needs to follow up with [facility] and make an appointment.   DISCHARGE MEDICATIONS:  1. Celebrex 200 mg oral capsule 1 cap orally once a day as needed.  2. Hydrochlorothiazide triamterene 25 mg/37.5 mg  oral tablet 1 tablet orally once a day in the morning.  3. Omega Red 1 cap orally once a day in the morning, stop after surgery.  4. Omeprazole 20 mg oral tablet 1 cap orally once a day in the morning.  5. Zyrtec 10 mg oral tablet 1 tablet orally once a day in the morning. 6. Alka-Seltzer mucus and congestion 2 capsules orally 2 times a day as needed.   ____________________________ Duanne Guess, PA-C tcg:cms D: 09/13/2011 16:49:07 ET T: 09/14/2011 11:00:22 ET JOB#: [id number]  cc: Duanne Guess, PA-C, <Dictator> Duanne Guess PA ELECTRONICALLY SIGNED 09/22/2011 14:00

## 2014-07-06 NOTE — Op Note (Signed)
PATIENT NAME:  Kaitlyn Oliver, Kaitlyn Oliver MR#:  [id number] DATE OF BIRTH:  08/20/1933  DATE OF PROCEDURE:  09/08/2011  POSTOPERATIVE DIAGNOSIS: Left knee osteoarthritis.   POSTOPERATIVE DIAGNOSIS: Left knee osteoarthritis.   PROCEDURE: Left total knee replacement.   ANESTHESIA: Spinal.   SURGEON: Laurene Footman, MD   ASSISTANT: Rachelle Hora, PA-C   DESCRIPTION OF PROCEDURE: The patient was brought to the Operating Room, and after adequate spinal anesthesia was obtained the patient was placed on operative table with a tourniquet applied to the left upper thigh and an Alvarado legholder utilized. After prepping and draping, appropriate patient identification and timeout procedures were completed. The leg was exsanguinated with the use of an Esmarch and the tourniquet raised to 300 mmHg. A midline skin incision was made, followed by a medial parapatellar arthrotomy. Inspection revealed significant patellofemoral and medial compartment arthritis, moderate lateral compartment degenerative change. The anterior cruciate ligament was excised along with the fat pad. The proximal tibia was exposed for application of the Medacta cutting block. When this was applied and alignment checked, the pins were placed and the proximal tibia cut carried out. The proximal tibial bone was removed, and it seemed to be a match to the bone block model. Next, the femur was approached in a similar fashion using the bone block cutting block guide after removing residual cartilage at the end of the bone so that it would be an appropriate match to the cutting guide. Drill holes were made on the anterior surface of the femur as well as distally for rotation and the distal femoral cut made, again cuts matching the bone block. The cutting guide was applied to the distal femur and anterior, posterior, and chamfer cuts carried out with a size 3 cutting block. This gave no notching to the distal femur. The residual meniscus was excised at this time  as well as a small bit of bone at the posterior aspect of the tibia. Trial components were placed with appropriate rotation determined by a pin on the tibial side and a proximal drill hole made, followed by the V-notch to hold the tibial trial in place. The femoral trial was placed, and a 10 and 12 mm ultracongruent tibial trial was placed with full extension and good flexion through mid flexion for the subsequent final component. Distal drill holes were made through the distal femur and notch cut for the trochlea. The patella was cut with the patellar cutting guide and sized to a size 2 after drill holes were made. At this point, all trial components were removed. The knee was  injected posteriorly with 0.5% Sensorcaine with epinephrine and morphine to aid in postoperative analgesia. The bony surfaces were thoroughly irrigated and dried, and the tibial component was cemented into place first, followed by placement of the tibial insert. The femoral component was impacted into place, and the knee held in extension, and the patellar button clamped into place. After the cement had set, excess cement was removed and the wound thoroughly irrigated. The patella tracked well with no-touch technique, and the tourniquet was let down. Hemostasis was checked with electrocautery. The arthrotomy was repaired using a heavy quill suture, 2-0 quill subcutaneously, and skin staples. The wound was dressed with Xeroform, 4 x 4's, ABD, Webril, and Polar Care, followed by additional Webril and Ace wrap, knee immobilizer. The patient was sent to the recovery room in stable condition.   ESTIMATED BLOOD LOSS: 100 mL.  TOURNIQUET TIME: 59 minutes at 300 mmHg.  IMPLANTS: Medacta GMK  primary femoral component, left standard size 3, a fixed left tibia tray size 3, a size 3 12-mm ultracongruent tibial insert with a size 2 cemented resurfacing patella. All components were cemented.   SPECIMEN: Cut ends of bone.   CONDITION: Condition  to the recovery room  was stable.  ____________________________ Laurene Footman, MD mjm:cbb D: 09/08/2011 21:10:23 ET T: 09/09/2011 09:46:00 ET JOB#: [id number]  cc: Laurene Footman, MD, <Dictator> Laurene Footman MD ELECTRONICALLY SIGNED 09/09/2011 12:49

## 2014-09-03 DIAGNOSIS — M7061 Trochanteric bursitis, right hip: Secondary | ICD-10-CM | POA: Diagnosis not present

## 2014-09-03 DIAGNOSIS — M25551 Pain in right hip: Secondary | ICD-10-CM | POA: Diagnosis not present

## 2014-09-11 ENCOUNTER — Ambulatory Visit (INDEPENDENT_AMBULATORY_CARE_PROVIDER_SITE_OTHER): Payer: Medicare Other | Admitting: Family Medicine

## 2014-09-11 ENCOUNTER — Encounter: Payer: Self-pay | Admitting: Family Medicine

## 2014-09-11 VITALS — BP 130/80 | HR 60 | Ht 65.0 in | Wt 201.0 lb

## 2014-09-11 DIAGNOSIS — D369 Benign neoplasm, unspecified site: Secondary | ICD-10-CM | POA: Insufficient documentation

## 2014-09-11 DIAGNOSIS — H269 Unspecified cataract: Secondary | ICD-10-CM | POA: Insufficient documentation

## 2014-09-11 DIAGNOSIS — K2970 Gastritis, unspecified, without bleeding: Secondary | ICD-10-CM | POA: Insufficient documentation

## 2014-09-11 DIAGNOSIS — K279 Peptic ulcer, site unspecified, unspecified as acute or chronic, without hemorrhage or perforation: Secondary | ICD-10-CM | POA: Insufficient documentation

## 2014-09-11 DIAGNOSIS — E782 Mixed hyperlipidemia: Secondary | ICD-10-CM | POA: Diagnosis not present

## 2014-09-11 DIAGNOSIS — E039 Hypothyroidism, unspecified: Secondary | ICD-10-CM | POA: Diagnosis not present

## 2014-09-11 DIAGNOSIS — I10 Essential (primary) hypertension: Secondary | ICD-10-CM

## 2014-09-11 MED ORDER — OMEPRAZOLE 20 MG PO CPDR: 20.0000 mg | DELAYED_RELEASE_CAPSULE | Freq: Every day | ORAL | Status: DC

## 2014-09-11 NOTE — Progress Notes (Signed)
Name: Kaitlyn Oliver   MRN: [id number]    DOB: Oct 20, 1933   Date:09/11/2014       Progress Note  Subjective  Chief Complaint  Chief Complaint  Patient presents with  . Gastrophageal Reflux  . Hypothyroidism  . Hypertension    "My blood pressure has been running real high"    Gastrophageal Reflux She reports no abdominal pain, no belching, no chest pain, no choking, no coughing, no dysphagia, no early satiety, no globus sensation, no heartburn, no hoarse voice, no nausea, no sore throat, no stridor, no tooth decay, no water brash or no wheezing. This is a recurrent problem. The current episode started more than 1 year ago. The problem occurs frequently. The problem has been waxing and waning. Pertinent negatives include no anemia, fatigue, melena, muscle weakness, orthopnea or weight loss. There are no known risk factors. She has tried nothing for the symptoms. Past procedures do not include an abdominal ultrasound or an EGD.  Hypertension This is a chronic problem. The current episode started more than 1 year ago. The problem has been gradually improving since onset. Pertinent negatives include no anxiety, blurred vision, chest pain, headaches, malaise/fatigue, neck pain, orthopnea, palpitations, peripheral edema, PND, shortness of breath or sweats. There are no associated agents to hypertension. There are no known risk factors for coronary artery disease. Past treatments include nothing. The current treatment provides mild improvement. There are no compliance problems.  Hypertensive end-organ damage includes a thyroid problem. There is no history of angina, kidney disease, CAD/MI, CVA, heart failure, left ventricular hypertrophy, PVD or retinopathy. There is no history of chronic renal disease.  Thyroid Problem Presents for follow-up visit. Patient reports no anxiety, cold intolerance, constipation, depressed mood, diaphoresis, diarrhea, dry skin, fatigue, hair loss, heat intolerance, hoarse  voice, leg swelling, menstrual problem, nail problem, palpitations, tremors, visual change, weight gain or weight loss. The symptoms have been stable. Past treatments include nothing. The treatment provided mild relief. There is no history of atrial fibrillation, heart failure or neuropathy. There are no known risk factors.    No problem-specific assessment & plan notes found for this encounter.   Past Medical History  Diagnosis Date  . GERD (gastroesophageal reflux disease)   . Thyroid disease     Past Surgical History  Procedure Laterality Date  . Vaginal hysterectomy    . Rotator cuff repair Bilateral   . Mastectomy Bilateral   . Total knee arthroplasty Left   . Cholecystectomy    . Colonoscopy  2013    Dr Gustavo Lah  . Cataract extraction Bilateral     History reviewed. No pertinent family history.  History   Social History  . Marital Status: Widowed    Spouse Name: N/A  . Number of Children: N/A  . Years of Education: N/A   Occupational History  . Not on file.   Social History Main Topics  . Smoking status: Former Research scientist (life sciences)  . Smokeless tobacco: Not on file  . Alcohol Use: No  . Drug Use: No  . Sexual Activity: Yes   Other Topics Concern  . Not on file   Social History Narrative  . No narrative on file    Allergies  Allergen Reactions  . Penicillin G Itching     Review of Systems  Constitutional: Negative for weight loss, weight gain, malaise/fatigue, diaphoresis and fatigue.  HENT: Negative for hoarse voice and sore throat.   Eyes: Negative for blurred vision.  Respiratory: Negative for cough, choking, shortness of  breath and wheezing.   Cardiovascular: Negative for chest pain, palpitations, orthopnea and PND.  Gastrointestinal: Negative for heartburn, dysphagia, nausea, abdominal pain, diarrhea, constipation and melena.  Genitourinary: Negative for menstrual problem.  Musculoskeletal: Negative for muscle weakness and neck pain.  Neurological:  Negative for tremors and headaches.  Endo/Heme/Allergies: Negative for cold intolerance and heat intolerance.  Psychiatric/Behavioral: The patient is not nervous/anxious.      Objective  Filed Vitals:   09/11/14 0922  BP: 130/80  Pulse: 60  Height: 5\' 5"  (1.651 m)  Weight: 201 lb (91.173 kg)    Physical Exam    Assessment & Plan  Problem List Items Addressed This Visit      Cardiovascular and Mediastinum   Essential (primary) hypertension - Primary   Relevant Orders   Renal Function Panel     Digestive   Gastroduodenal ulcer   Relevant Medications   omeprazole (PRILOSEC) 20 MG capsule     Endocrine   Hypothyroidism   Relevant Medications   levothyroxine (SYNTHROID, LEVOTHROID) 25 MCG tablet   Other Relevant Orders   TSH     Other   Combined fat and carbohydrate induced hyperlipemia   Relevant Orders   Lipid Profile        Dr. Otilio Miu [facility]  09/11/2014

## 2014-09-11 NOTE — Patient Instructions (Signed)
The DASH Diet  Dietary Approaches to Stop Hypertension   What is hypertension?  Hypertension is the term for blood pressure that is consistently higher than normal. Blood pressure is the force of blood against artery walls. Blood pressure can be unhealthy if it is above 120/80. The higher your blood pressure, the greater the health risk. High blood pressure can be controlled if you take these steps:  . Maintain a healthy weight.  . Be physically active.  . Follow a healthy eating plan, which includes foods lower in salt and sodium.  . If you drink alcoholic beverages, do so in moderation.  As noted in this list, diet affects high blood pressure. Following the DASH diet and reducing the amount of sodium in your diet will help lower your blood pressure. It will also help prevent high blood pressure.   What is the DASH diet?  Dietary Approaches to Stop Hypertension (DASH) is a diet that is low in saturated fat, cholesterol, and total fat. It emphasizes fruits, vegetables, and low-fat dairy foods. The DASH diet also includes whole-grain products, fish, poultry, and nuts. It encourages fewer servings of red meat, sweets, and sugar-containing beverages. It is rich in magnesium, potassium, and calcium, as well as protein and fiber.   How do I get started on the DASH diet?  The DASH diet requires no special foods and has no hard-to-follow recipes. Start by seeing how DASH compares with your current eating habits. The DASH eating plan shown is based on 2,000 calories a day.   Your health care provider or a dietitian can help you determine how many calories a day you need. Most adults need somewhere between 1600 and 2800 calories a day. Serving sizes will vary between 1/2 cup and 1 1/4 cups. Check the product's nutrition label to determine serving sizes of particular products.  Type of Food Servings for Diet of 2000 Calories/Day  Grains/Grain products (include at least 3 whole grain foods each day) 7-8   Fruits 4-5  Vegetables 4-5  Low fat or non-fat dairy foods 2-3  Lean meats, fish, poultry 2 or less  Nuts, seeds, and legumes 4-5/week  Fats and sweets Limited   Make changes gradually.  Here are some suggestions that might help:  . If you now eat 1 or 2 servings of vegetables a day, add a serving at lunch and another at dinner.  . If you don't eat fruit now or have only juice at breakfast, add a serving to your meals or have it as a snack.  . Drink milk or water with lunch or dinner instead of soda, sugar sweetened tea, or alcohol. Choose low-fat (1%) or fat-free (skim) dairy products to reduce how much saturated fat, total fat, cholesterol, and calories you eat.  . If you have trouble digesting dairy products, try taking lactase enzyme pills or drops (available at drugstores and groceries) with the dairy foods. Or buy lactose-free milk or milk with lactase enzyme added to it.  . Read food labels on margarines and salad dressings to choose products lowest in fat.  . If you now eat large portions of meat, cut back gradually--by a half or a third at each meal. Limit meat to 6 ounces a day (2 servings). Three to four ounces is about the size of a deck of cards.  . Have 2 or more vegetarian-style (meatless) meals each week.   Increase servings of vegetables, rice, pasta, and beans in all meals.  Try casseroles and pasta, and   stir-fry dishes, which have less meat and more vegetables, grains, and beans.  . Use fruits canned in their own juice. Fresh fruits require little or no preparation. Dried fruits are a good choice to carry with you or to have ready in the car.  . Try these snacks ideas: unsalted pretzels or nuts mixed with raisins, graham crackers, low-fat and fat-free yogurt and frozen yogurt, popcorn with no salt or butter added, and raw vegetables.  . Choose whole grain foods to get more nutrients, including minerals and fiber. For example, choose whole-wheat bread or whole-grain cereals.   . Use fresh, frozen, or no-salt-added canned vegetables.   Remember to also reduce the salt and sodium in your diet. Try to have no more than 2000 milligrams (mg) of sodium per day, with a goal of further reducing the sodium to 1500 mg per day. Three important ways to reduce sodium are:  . Use reduced-sodium or no-salt-added food products.  . Use less salt when you prepare foods and do not add salt to your food at the table.  . Read fool labels. Aim for foods that are less than 5 percent of the daily value of sodium.   The DASH eating plan was not designed for weight loss. But it contains many lower calorie foods, such as fruits and vegetables. You can make it lower in calories by replacing higher calorie foods with more fruits and vegetables. Some ideas to increase fruits and vegetables and decrease calories include:  . Eat a medium apple instead of four shortbread cookies. You'll save 80 calories.  . Eat 1/4 cup of dried apricots instead of a 2-ounce bag of pork rinds. You'll save 230 calories.  . Have a hamburger that's 3 ounces instead of 6 ounces. Add a  cup serving of carrots and a 1/2 cup serving of spinach. You'll save more than 200 calories.  . Instead of 5 ounces of chicken, have a stir fry with 2 ounces ofchicken and 1 and 1/2 cups of raw vegetables. Use a small amount of vegetable oil. You'll save 50 calories.  . Have a 1/2 cup serving of low-fat frozen yogurt instead of a 1 and 1/2 ounce milk chocolate bar. You'll save about 110 calories.  . Use low-fat or fat-free condiments, such as fat free salad dressings.  . Eat smaller portions--cut back gradually.  . Use food labels to compare fat content in packaged foods. Items marked low-fat or fat-free may be lower in fat without being lower in calories than their regular versions.  . Limit foods with lots of added sugar, such as pies, flavored yogurts, candy bars, ice cream, sherbet, regular soft drinks, and fruit drinks.  . Drink water  or club soda instead of cola or other soda drinks.    

## 2014-09-12 LAB — RENAL FUNCTION PANEL
ALBUMIN: 4.7 g/dL (ref 3.5–4.8)
BUN/Creatinine Ratio: 13 (ref 11–26)
BUN: 12 mg/dL (ref 8–27)
CO2: 25 mmol/L (ref 18–29)
Calcium: 10.2 mg/dL (ref 8.7–10.3)
Chloride: 100 mmol/L (ref 97–108)
Creatinine, Ser: 0.95 mg/dL (ref 0.57–1.00)
GFR calc Af Amer: 66 mL/min/{1.73_m2} (ref >59)
GFR, EST NON AFRICAN AMERICAN: 58 mL/min/{1.73_m2} — AB (ref >59)
Glucose: 89 mg/dL (ref 65–99)
PHOSPHORUS: 3.3 mg/dL (ref 2.5–4.5)
Potassium: 3.9 mmol/L (ref 3.5–5.2)
SODIUM: 142 mmol/L (ref 134–144)

## 2014-09-12 LAB — TSH: TSH: 3.13 u[IU]/mL (ref 0.450–4.500)

## 2014-09-12 LAB — LIPID PANEL
CHOL/HDL RATIO: 6.7 ratio — AB (ref 0.0–4.4)
CHOLESTEROL TOTAL: 253 mg/dL — AB (ref 100–199)
HDL: 38 mg/dL — AB (ref >39)
LDL Calculated: 158 mg/dL — ABNORMAL HIGH (ref 0–99)
TRIGLYCERIDES: 284 mg/dL — AB (ref 0–149)
VLDL Cholesterol Cal: 57 mg/dL — ABNORMAL HIGH (ref 5–40)

## 2014-09-17 ENCOUNTER — Ambulatory Visit (INDEPENDENT_AMBULATORY_CARE_PROVIDER_SITE_OTHER): Payer: Medicare Other | Admitting: Family Medicine

## 2014-09-17 ENCOUNTER — Encounter: Payer: Self-pay | Admitting: Family Medicine

## 2014-09-17 VITALS — BP 130/92 | HR 64 | Temp 98.0°F | Ht 65.0 in | Wt 207.0 lb

## 2014-09-17 DIAGNOSIS — L03012 Cellulitis of left finger: Secondary | ICD-10-CM

## 2014-09-17 DIAGNOSIS — K529 Noninfective gastroenteritis and colitis, unspecified: Secondary | ICD-10-CM

## 2014-09-17 MED ORDER — LOPERAMIDE HCL 2 MG PO TABS: 2.0000 mg | ORAL_TABLET | Freq: Four times a day (QID) | ORAL | Status: DC | PRN

## 2014-09-17 MED ORDER — SULFAMETHOXAZOLE-TRIMETHOPRIM 800-160 MG PO TABS: 1.0000 | ORAL_TABLET | Freq: Two times a day (BID) | ORAL | Status: DC

## 2014-09-17 NOTE — Progress Notes (Signed)
Name: Kaitlyn Oliver   MRN: [id number]    DOB: 03-02-1934   Date:09/17/2014       Progress Note  Subjective  Chief Complaint  Chief Complaint  Patient presents with  . Abdominal Pain    having episodes of diarrhea with no relief  . Finger Injury    "sliced it cutting an onion"    Abdominal Pain This is a recurrent problem. The current episode started yesterday. The onset quality is gradual. The problem occurs 2 to 4 times per day. The problem has been waxing and waning. The pain is located in the epigastric region and periumbilical region. The pain is at a severity of 5/10. The pain is mild. The quality of the pain is colicky. The abdominal pain does not radiate. Associated symptoms include diarrhea and nausea. Pertinent negatives include no constipation, dysuria, fever, frequency, headaches, hematuria, melena, myalgias, vomiting or weight loss. Nothing aggravates the pain. The pain is relieved by nothing. She has tried nothing for the symptoms. Her past medical history is significant for abdominal surgery and GERD. There is no history of gallstones, PUD or ulcerative colitis.  Rash This is a new problem. The current episode started yesterday. The problem has been gradually worsening since onset. The affected locations include the left fingers. The rash is characterized by redness. She was exposed to nothing (cut while slicing onion). Associated symptoms include diarrhea. Pertinent negatives include no cough, fever, joint pain, shortness of breath, sore throat or vomiting. Past treatments include nothing.    No problem-specific assessment & plan notes found for this encounter.   Past Medical History  Diagnosis Date  . GERD (gastroesophageal reflux disease)   . Thyroid disease     Past Surgical History  Procedure Laterality Date  . Vaginal hysterectomy    . Rotator cuff repair Bilateral   . Mastectomy Bilateral   . Total knee arthroplasty Left   . Cholecystectomy    . Colonoscopy   2013    Dr Marva Panda  . Cataract extraction Bilateral     History reviewed. No pertinent family history.  History   Social History  . Marital Status: Widowed    Spouse Name: N/A  . Number of Children: N/A  . Years of Education: N/A   Occupational History  . Not on file.   Social History Main Topics  . Smoking status: Former Games developer  . Smokeless tobacco: Not on file  . Alcohol Use: No  . Drug Use: No  . Sexual Activity: Yes   Other Topics Concern  . Not on file   Social History Narrative    Allergies  Allergen Reactions  . Penicillin G Itching     Review of Systems  Constitutional: Negative for fever, chills, weight loss and malaise/fatigue.  HENT: Negative for ear discharge, ear pain and sore throat.   Eyes: Negative for blurred vision.  Respiratory: Negative for cough, sputum production, shortness of breath and wheezing.   Cardiovascular: Negative for chest pain, palpitations and leg swelling.  Gastrointestinal: Positive for heartburn, nausea, abdominal pain and diarrhea. Negative for vomiting, constipation, blood in stool and melena.  Genitourinary: Negative for dysuria, urgency, frequency and hematuria.  Musculoskeletal: Negative for myalgias, back pain, joint pain and neck pain.  Skin: Positive for rash.  Neurological: Negative for dizziness, tingling, sensory change, focal weakness and headaches.  Endo/Heme/Allergies: Negative for environmental allergies and polydipsia. Does not bruise/bleed easily.  Psychiatric/Behavioral: Negative for depression and suicidal ideas. The patient is not nervous/anxious and does not  have insomnia.      Objective  Filed Vitals:   09/17/14 0801  BP: 130/92  Pulse: 64  Temp: 98 F (36.7 C)  TempSrc: Oral  Height: 5\' 5"  (1.651 m)  Weight: 207 lb (93.895 kg)    Physical Exam  Constitutional: She is well-developed, well-nourished, and in no distress. No distress.  HENT:  Head: Normocephalic and atraumatic.  Right Ear:  External ear normal.  Left Ear: External ear normal.  Nose: Nose normal.  Mouth/Throat: Oropharynx is clear and moist.  Eyes: Conjunctivae and EOM are normal. Pupils are equal, round, and reactive to light. Right eye exhibits no discharge. Left eye exhibits no discharge.  Neck: Normal range of motion. Neck supple. No JVD present. No thyromegaly present.  Cardiovascular: Normal rate, regular rhythm, normal heart sounds and intact distal pulses.  Exam reveals no gallop and no friction rub.   No murmur heard. Pulmonary/Chest: Effort normal and breath sounds normal.  Abdominal: Soft. Bowel sounds are normal. She exhibits no distension and no mass. There is tenderness. There is no rebound and no guarding.  Musculoskeletal: Normal range of motion. She exhibits no edema.  Lymphadenopathy:    She has no cervical adenopathy.  Neurological: She is alert. She has normal reflexes.  Skin: Skin is warm and dry. She is not diaphoretic.  Psychiatric: Mood and affect normal.  Vitals reviewed.     Assessment & Plan  Problem List Items Addressed This Visit    None    Visit Diagnoses    Gastroenteritis    -  Primary    suggest antacid    Relevant Medications    loperamide (IMODIUM A-D) 2 MG tablet    Other Relevant Orders    CBC w/Diff    Cellulitis of finger of left hand        Relevant Medications    sulfamethoxazole-trimethoprim (BACTRIM DS,SEPTRA DS) 800-160 MG per tablet         Dr. [facility]  09/17/2014

## 2014-09-18 LAB — CBC WITH DIFFERENTIAL/PLATELET
BASOS ABS: 0 10*3/uL (ref 0.0–0.2)
BASOS: 0 %
EOS (ABSOLUTE): 0.1 10*3/uL (ref 0.0–0.4)
Eos: 2 %
HEMATOCRIT: 44.2 % (ref 34.0–46.6)
HEMOGLOBIN: 15 g/dL (ref 11.1–15.9)
Immature Grans (Abs): 0 10*3/uL (ref 0.0–0.1)
Immature Granulocytes: 0 %
LYMPHS ABS: 2.5 10*3/uL (ref 0.7–3.1)
LYMPHS: 28 %
MCH: 30.3 pg (ref 26.6–33.0)
MCHC: 33.9 g/dL (ref 31.5–35.7)
MCV: 89 fL (ref 79–97)
Monocytes Absolute: 0.7 10*3/uL (ref 0.1–0.9)
Monocytes: 8 %
Neutrophils Absolute: 5.6 10*3/uL (ref 1.4–7.0)
Neutrophils: 62 %
Platelets: 185 10*3/uL (ref 150–379)
RBC: 4.95 x10E6/uL (ref 3.77–5.28)
RDW: 15 % (ref 12.3–15.4)
WBC: 9 10*3/uL (ref 3.4–10.8)

## 2014-10-07 ENCOUNTER — Ambulatory Visit (INDEPENDENT_AMBULATORY_CARE_PROVIDER_SITE_OTHER): Payer: Medicare Other | Admitting: Family Medicine

## 2014-10-07 ENCOUNTER — Encounter: Payer: Self-pay | Admitting: Family Medicine

## 2014-10-07 VITALS — BP 150/100 | HR 72 | Ht 65.0 in | Wt 209.0 lb

## 2014-10-07 DIAGNOSIS — F419 Anxiety disorder, unspecified: Secondary | ICD-10-CM | POA: Diagnosis not present

## 2014-10-07 DIAGNOSIS — F329 Major depressive disorder, single episode, unspecified: Secondary | ICD-10-CM | POA: Diagnosis not present

## 2014-10-07 DIAGNOSIS — I10 Essential (primary) hypertension: Secondary | ICD-10-CM

## 2014-10-07 DIAGNOSIS — F32A Depression, unspecified: Secondary | ICD-10-CM

## 2014-10-07 MED ORDER — ALPRAZOLAM 0.25 MG PO TABS: 0.2500 mg | ORAL_TABLET | Freq: Two times a day (BID) | ORAL | Status: DC | PRN

## 2014-10-07 MED ORDER — SERTRALINE HCL 25 MG PO TABS: 25.0000 mg | ORAL_TABLET | Freq: Every day | ORAL | Status: DC

## 2014-10-07 NOTE — Progress Notes (Signed)
Name: Kaitlyn Oliver   MRN: [id number]    DOB: 08/06/1933   Date:10/07/2014       Progress Note  Subjective  Chief Complaint  Chief Complaint  Patient presents with  . Anxiety    recently had a relationship break up    Anxiety Presents for initial visit. Onset was in the past 7 days. The problem has been gradually worsening. Symptoms include depressed mood, insomnia and nervous/anxious behavior. Patient reports no chest pain, compulsions, dizziness, dry mouth, excessive worry, feeling of choking, hyperventilation, impotence, irritability, malaise, muscle tension, nausea, obsessions, palpitations, panic, restlessness, shortness of breath or suicidal ideas. Symptoms occur constantly. The severity of symptoms is moderate. The quality of sleep is poor.   Risk factors include a major life event. There is no history of anemia, anxiety/panic attacks, arrhythmia, asthma, bipolar disorder, CAD, CHF, chronic lung disease, depression, fibromyalgia, hyperthyroidism or suicide attempts. Past treatments include nothing.    No problem-specific assessment & plan notes found for this encounter.   Past Medical History  Diagnosis Date  . GERD (gastroesophageal reflux disease)   . Thyroid disease     Past Surgical History  Procedure Laterality Date  . Vaginal hysterectomy    . Rotator cuff repair Bilateral   . Mastectomy Bilateral   . Total knee arthroplasty Left   . Cholecystectomy    . Colonoscopy  2013    Dr Gustavo Lah  . Cataract extraction Bilateral     History reviewed. No pertinent family history.  History   Social History  . Marital Status: Widowed    Spouse Name: N/A  . Number of Children: N/A  . Years of Education: N/A   Occupational History  . Not on file.   Social History Main Topics  . Smoking status: Former Research scientist (life sciences)  . Smokeless tobacco: Not on file  . Alcohol Use: No  . Drug Use: No  . Sexual Activity: Yes   Other Topics Concern  . Not on file   Social History  Narrative    Allergies  Allergen Reactions  . Penicillin G Itching     Review of Systems  Constitutional: Negative for fever, chills, weight loss, malaise/fatigue, diaphoresis and irritability.  HENT: Negative for ear discharge, ear pain, hearing loss and tinnitus.   Respiratory: Negative for shortness of breath.   Cardiovascular: Negative for chest pain and palpitations.  Gastrointestinal: Negative for nausea.  Genitourinary: Negative for impotence.  Skin: Negative for itching and rash.  Neurological: Negative for dizziness, tingling, tremors, weakness and headaches.  Psychiatric/Behavioral: Positive for depression. Negative for suicidal ideas. The patient is nervous/anxious and has insomnia.      Objective  Filed Vitals:   10/07/14 1401  BP: 150/100  Pulse: 72  Height: 5\' 5"  (1.651 m)  Weight: 209 lb (94.802 kg)    Physical Exam  Constitutional: She is well-developed, well-nourished, and in no distress.  HENT:  Head: Normocephalic and atraumatic.  Right Ear: External ear normal.  Left Ear: External ear normal.  Nose: Nose normal.  Mouth/Throat: Oropharynx is clear and moist.  Eyes: Conjunctivae and EOM are normal. Pupils are equal, round, and reactive to light.  Neck: Normal range of motion. Neck supple.      Assessment & Plan  Problem List Items Addressed This Visit      Cardiovascular and Mediastinum   Essential (primary) hypertension - Primary    Other Visit Diagnoses    Depression        Relevant Medications    ALPRAZolam (  XANAX) 0.25 MG tablet    sertraline (ZOLOFT) 25 MG tablet    Acute anxiety        Relevant Medications    ALPRAZolam (XANAX) 0.25 MG tablet    sertraline (ZOLOFT) 25 MG tablet         Dr. [facility]  10/07/2014

## 2015-01-01 ENCOUNTER — Encounter: Payer: Self-pay | Admitting: Family Medicine

## 2015-01-01 ENCOUNTER — Ambulatory Visit (INDEPENDENT_AMBULATORY_CARE_PROVIDER_SITE_OTHER): Payer: Medicare Other | Admitting: Family Medicine

## 2015-01-01 VITALS — BP 130/100 | HR 70 | Ht 65.0 in | Wt 209.0 lb

## 2015-01-01 DIAGNOSIS — R5382 Chronic fatigue, unspecified: Secondary | ICD-10-CM | POA: Diagnosis not present

## 2015-01-01 DIAGNOSIS — I10 Essential (primary) hypertension: Secondary | ICD-10-CM | POA: Diagnosis not present

## 2015-01-01 MED ORDER — LOSARTAN POTASSIUM-HCTZ 50-12.5 MG PO TABS: 1.0000 | ORAL_TABLET | Freq: Every day | ORAL | Status: DC

## 2015-01-01 NOTE — Progress Notes (Signed)
Name: Kaitlyn Oliver   MRN: [id number]    DOB: 1933/10/25   Date:01/01/2015       Progress Note  Subjective  Chief Complaint  Chief Complaint  Patient presents with  . Hypertension    Hypertension This is a chronic problem. The current episode started more than 1 year ago. The problem has been gradually improving since onset. The problem is controlled. Pertinent negatives include no anxiety, blurred vision, chest pain, headaches, malaise/fatigue, neck pain, orthopnea, palpitations, peripheral edema, PND, shortness of breath or sweats. There are no associated agents to hypertension. Past treatments include nothing. There is no history of angina, kidney disease, CAD/MI, CVA, heart failure, left ventricular hypertrophy, PVD or retinopathy.  Anemia Presents for initial visit. There has been no abdominal pain, bruising/bleeding easily, fever, malaise/fatigue, palpitations or weight loss. Signs of blood loss that are not present include melena. There is no history of clotting disorder, heart failure, hemoglobinopathy, hypothyroidism or neuropathy.    No problem-specific assessment & plan notes found for this encounter.   Past Medical History  Diagnosis Date  . GERD (gastroesophageal reflux disease)   . Thyroid disease     Past Surgical History  Procedure Laterality Date  . Vaginal hysterectomy    . Rotator cuff repair Bilateral   . Mastectomy Bilateral   . Total knee arthroplasty Left   . Cholecystectomy    . Colonoscopy  2013    Dr Gustavo Lah  . Cataract extraction Bilateral     No family history on file.  Social History   Social History  . Marital Status: Widowed    Spouse Name: N/A  . Number of Children: N/A  . Years of Education: N/A   Occupational History  . Not on file.   Social History Main Topics  . Smoking status: Former Research scientist (life sciences)  . Smokeless tobacco: Not on file  . Alcohol Use: No  . Drug Use: No  . Sexual Activity: Yes   Other Topics Concern  . Not on  file   Social History Narrative    Allergies  Allergen Reactions  . Penicillin G Itching     Review of Systems  Constitutional: Negative for fever, chills, weight loss and malaise/fatigue.  HENT: Negative for ear discharge, ear pain and sore throat.   Eyes: Negative for blurred vision.  Respiratory: Negative for cough, sputum production, shortness of breath and wheezing.   Cardiovascular: Negative for chest pain, palpitations, orthopnea, leg swelling and PND.  Gastrointestinal: Negative for heartburn, nausea, abdominal pain, diarrhea, constipation, blood in stool and melena.  Genitourinary: Negative for dysuria, urgency, frequency and hematuria.  Musculoskeletal: Negative for myalgias, back pain, joint pain and neck pain.  Skin: Negative for rash.  Neurological: Negative for dizziness, tingling, sensory change, focal weakness and headaches.  Endo/Heme/Allergies: Negative for environmental allergies and polydipsia. Does not bruise/bleed easily.  Psychiatric/Behavioral: Negative for depression and suicidal ideas. The patient is not nervous/anxious and does not have insomnia.      Objective  Filed Vitals:   01/01/15 1049  BP: 130/100  Pulse: 70  Height: 5\' 5"  (1.651 m)  Weight: 209 lb (94.802 kg)    Physical Exam  Constitutional: She is well-developed, well-nourished, and in no distress. No distress.  HENT:  Head: Normocephalic and atraumatic.  Right Ear: External ear normal.  Left Ear: External ear normal.  Nose: Nose normal.  Mouth/Throat: Oropharynx is clear and moist.  Eyes: Conjunctivae and EOM are normal. Pupils are equal, round, and reactive to light. Right eye  exhibits no discharge. Left eye exhibits no discharge.  Neck: Normal range of motion. Neck supple. No JVD present. No thyromegaly present.  Cardiovascular: Normal rate, regular rhythm, normal heart sounds and intact distal pulses.  Exam reveals no gallop and no friction rub.   No murmur  heard. Pulmonary/Chest: Effort normal and breath sounds normal.  Abdominal: Soft. Bowel sounds are normal. She exhibits no mass. There is no tenderness. There is no guarding.  Musculoskeletal: Normal range of motion. She exhibits no edema.  Lymphadenopathy:    She has no cervical adenopathy.  Neurological: She is alert. She has normal reflexes.  Skin: Skin is warm and dry. She is not diaphoretic.  Psychiatric: Mood and affect normal.      Assessment & Plan  Problem List Items Addressed This Visit      Cardiovascular and Mediastinum   Essential (primary) hypertension - Primary   Relevant Medications   losartan-hydrochlorothiazide (HYZAAR) 50-12.5 MG tablet    Other Visit Diagnoses    Chronic fatigue             Dr. Otilio Miu [facility]  01/01/2015

## 2015-01-13 IMAGING — CR DG SINUSES 1-2V
2 series · 2 of 2 positions shown · non-contrast
Comparison: None.

CLINICAL DATA: Headache with facial pain the in the left maxillary
region.

EXAM:
PARANASAL SINUSES - 1-2 VIEW

[[person_name] (1 of 2)]
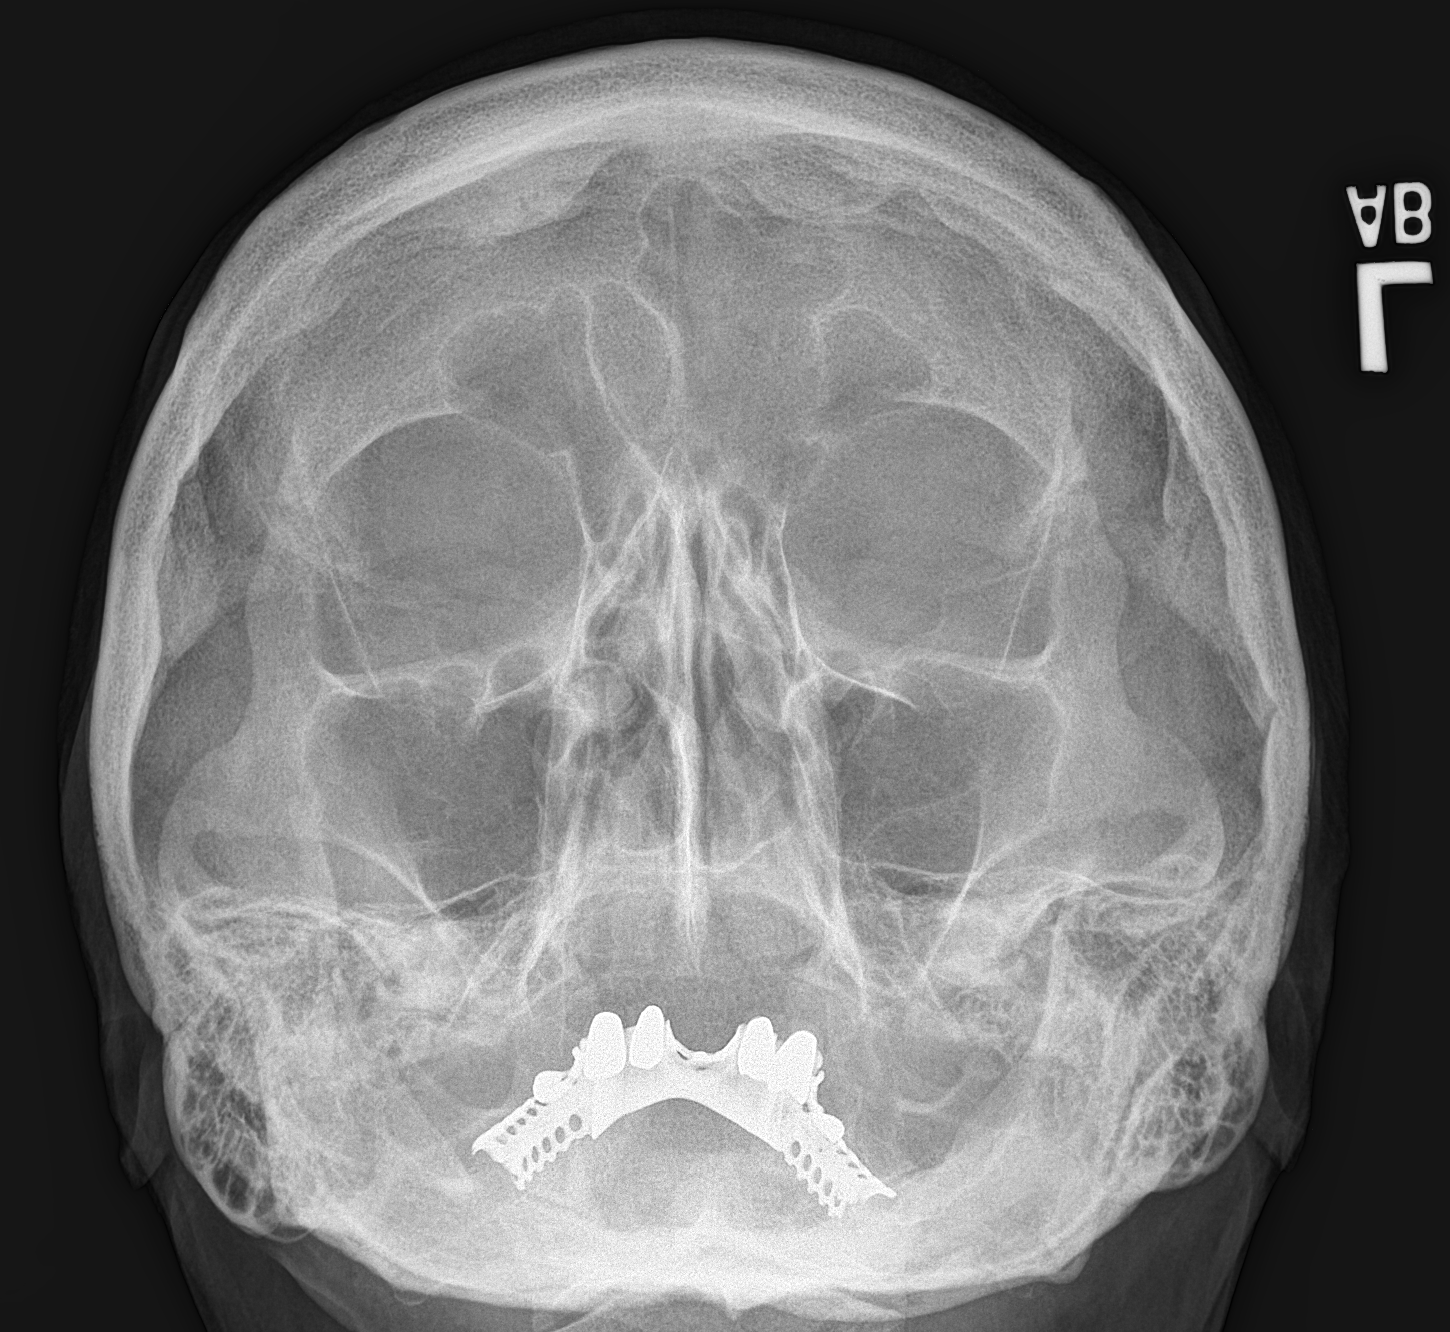

[[person_name] (2 of 2)]
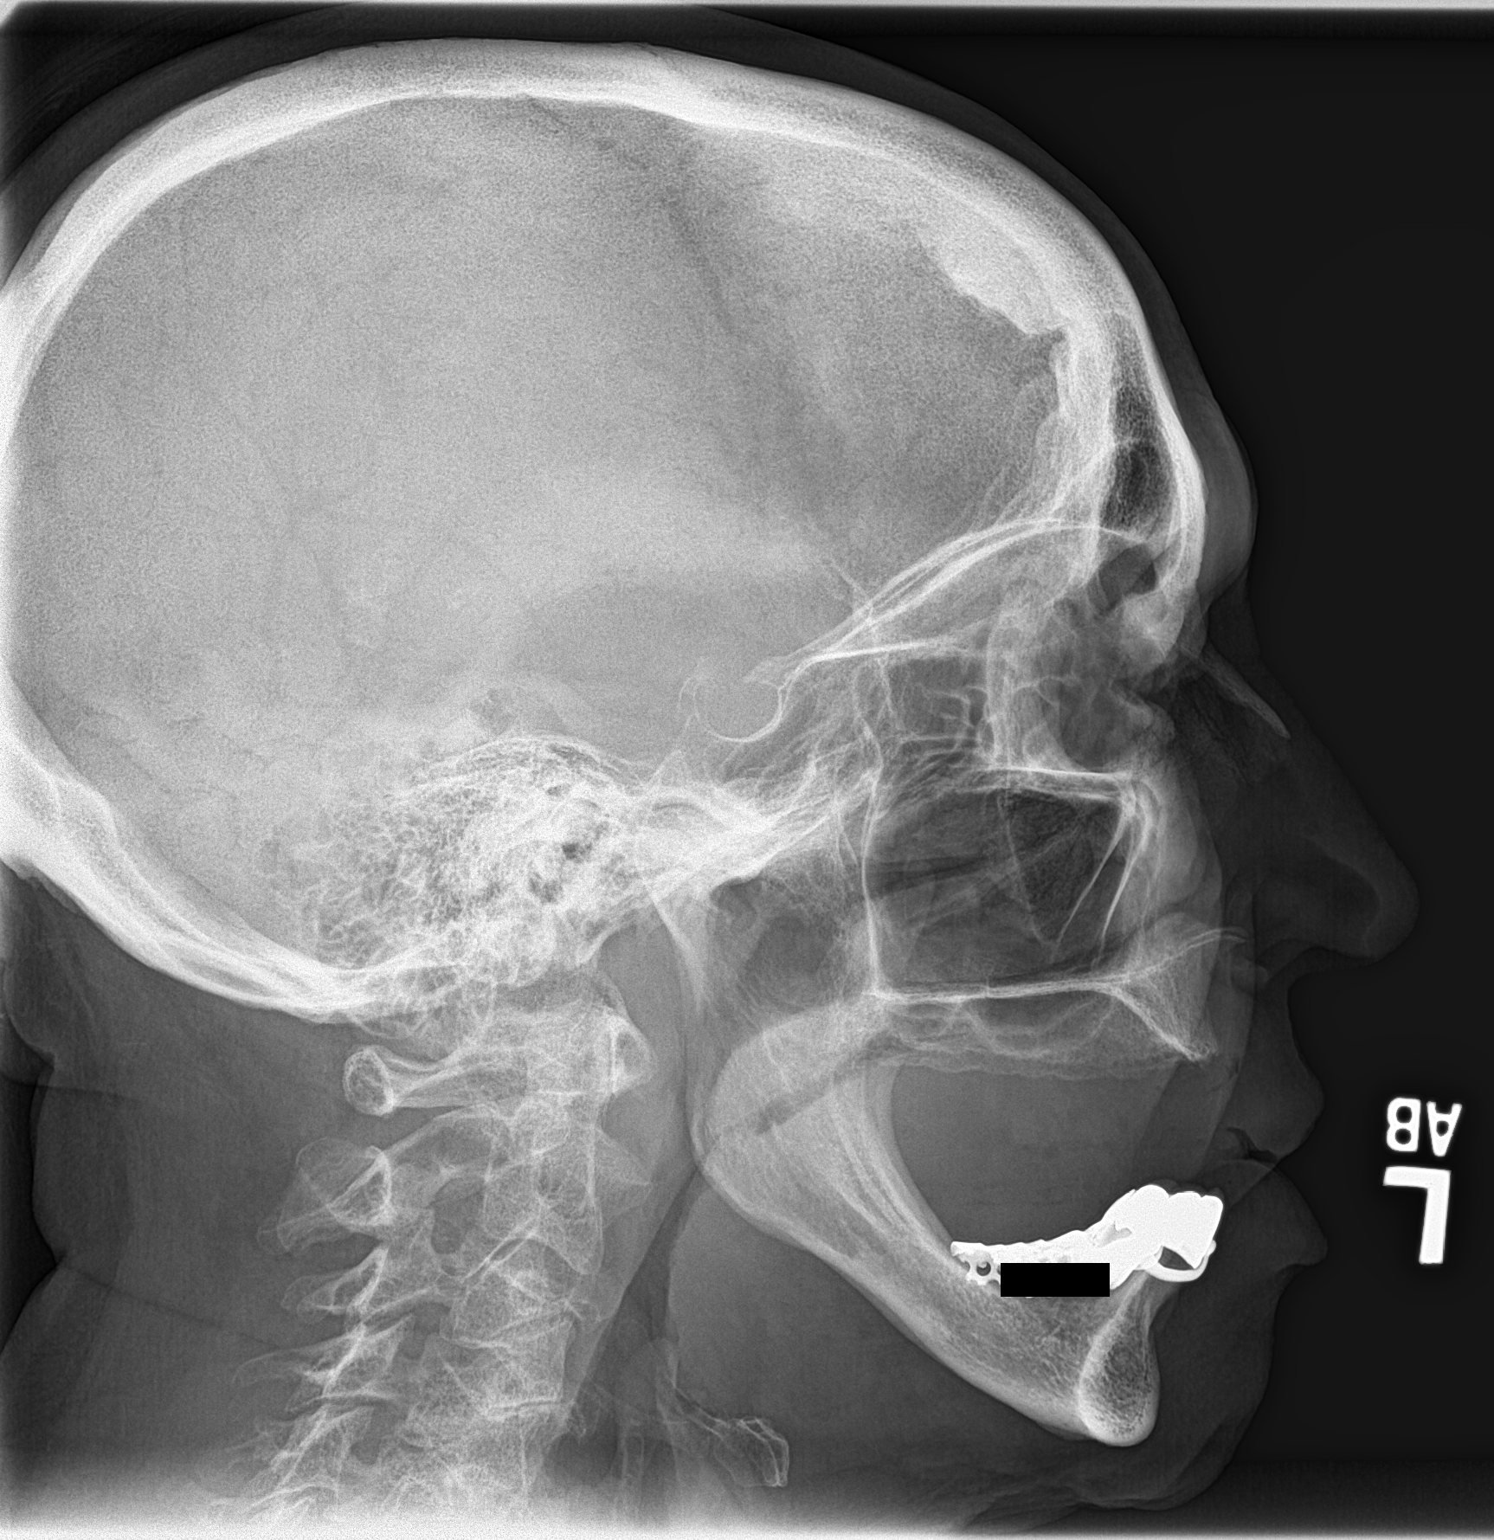

[2 of 2 positions shown; findings below may reference images not displayed]

FINDINGS: The paranasal sinus are aerated. There is no evidence of sinus
air-fluid level or mucosal thickening. The maxilla is edentulous. No
significant bone abnormalities are seen.
IMPRESSION: Negative paranasal sinuses.

## 2015-01-13 IMAGING — CR CERVICAL SPINE - 2-3 VIEW
3 series · 3 of 3 positions shown · non-contrast
Comparison: 03/18/2011

CLINICAL DATA: Headache, left side neck pain

EXAM:
CERVICAL SPINE - 2-3 VIEW

[c-spine lat]
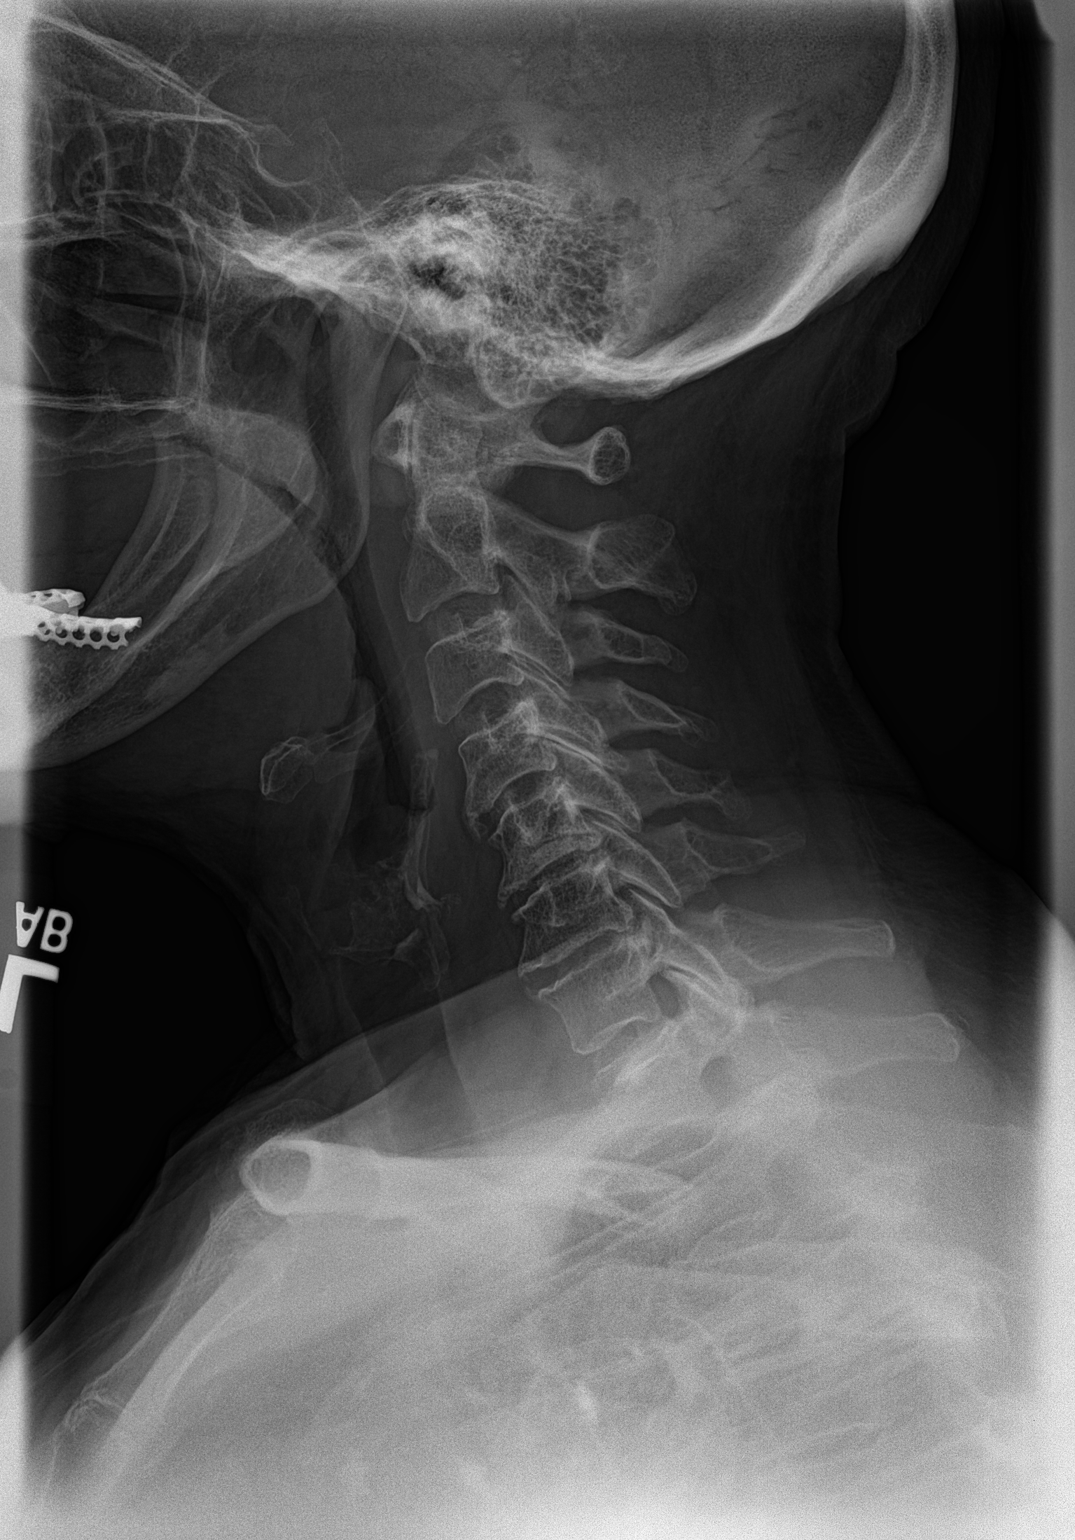

[c-spine ap]
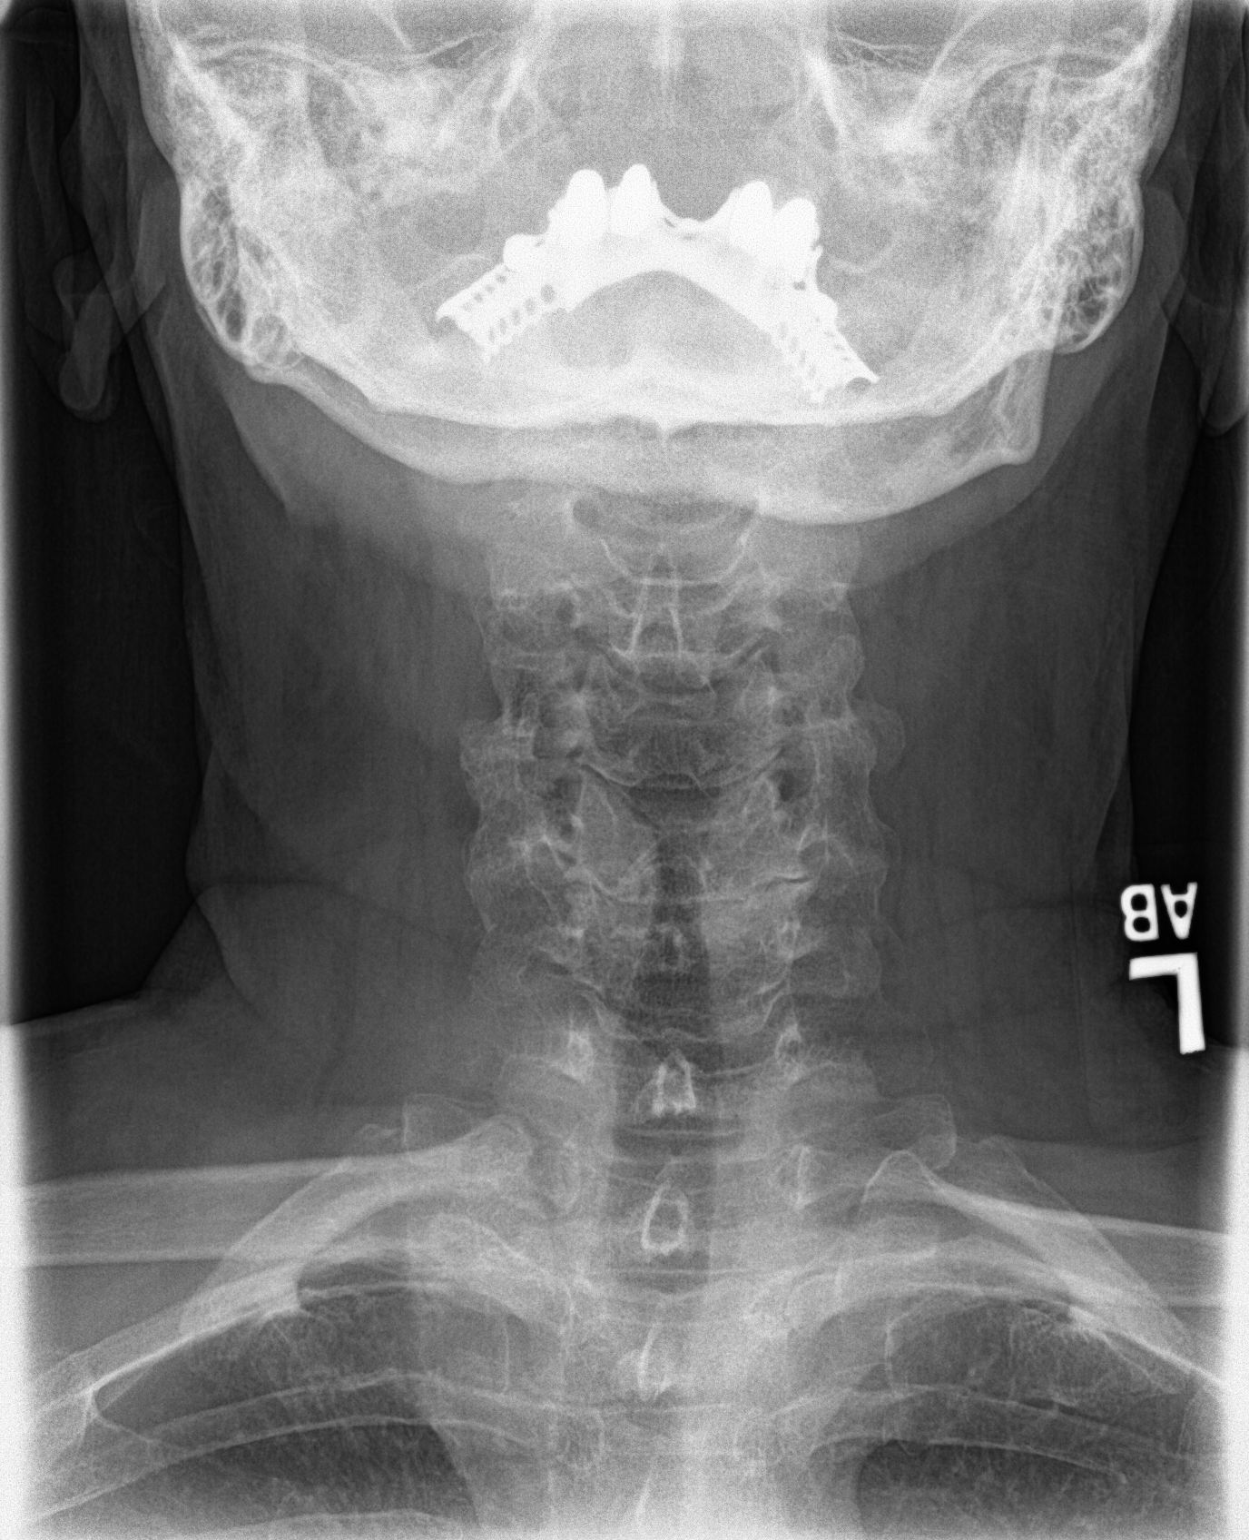

[c-spine open mouth]
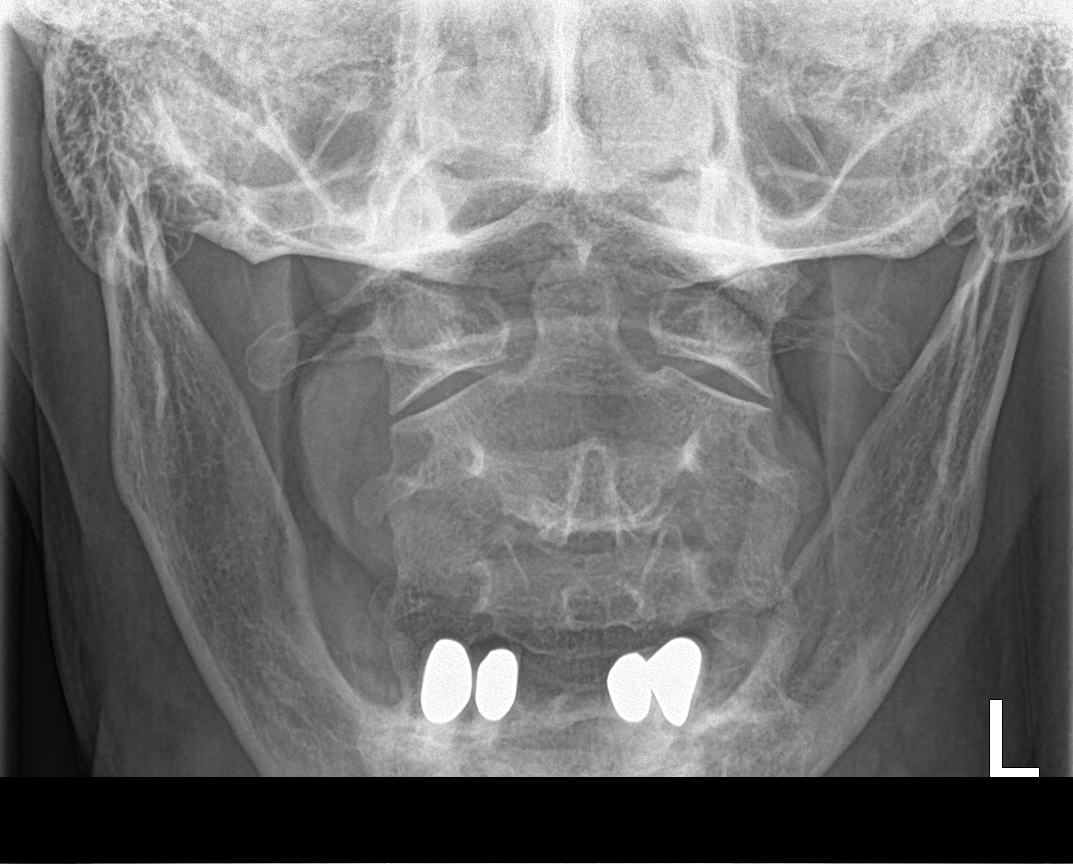

[3 of 3 positions shown; findings below may reference images not displayed]

FINDINGS: Three views of cervical spine submitted. No acute fracture or
subluxation. Anterior spurring noted lower endplate of C4-C5 and C6
vertebral body. Mild disc space flattening at C4-C5 and C6-C7 level.
Moderate disc space flattening with anterior spurring at C5-C6
level. Mild degenerative changes C1-C2 articulation.
IMPRESSION: No acute fracture or subluxation. Mild degenerative changes as
described above.

## 2015-01-28 DIAGNOSIS — Z961 Presence of intraocular lens: Secondary | ICD-10-CM | POA: Diagnosis not present

## 2015-01-28 DIAGNOSIS — H43813 Vitreous degeneration, bilateral: Secondary | ICD-10-CM | POA: Diagnosis not present

## 2015-01-30 ENCOUNTER — Encounter: Payer: Self-pay | Admitting: Family Medicine

## 2015-01-30 ENCOUNTER — Ambulatory Visit (INDEPENDENT_AMBULATORY_CARE_PROVIDER_SITE_OTHER): Payer: Medicare Other | Admitting: Family Medicine

## 2015-01-30 VITALS — BP 132/80 | HR 80 | Ht 65.0 in | Wt 211.0 lb

## 2015-01-30 DIAGNOSIS — F419 Anxiety disorder, unspecified: Secondary | ICD-10-CM

## 2015-01-30 DIAGNOSIS — I10 Essential (primary) hypertension: Secondary | ICD-10-CM

## 2015-01-30 DIAGNOSIS — F329 Major depressive disorder, single episode, unspecified: Secondary | ICD-10-CM

## 2015-01-30 DIAGNOSIS — F418 Other specified anxiety disorders: Secondary | ICD-10-CM

## 2015-01-30 DIAGNOSIS — F32A Depression, unspecified: Secondary | ICD-10-CM

## 2015-01-30 MED ORDER — SERTRALINE HCL 50 MG PO TABS: 50.0000 mg | ORAL_TABLET | Freq: Every day | ORAL | Status: DC

## 2015-01-30 MED ORDER — LOSARTAN POTASSIUM-HCTZ 50-12.5 MG PO TABS: 1.0000 | ORAL_TABLET | Freq: Every day | ORAL | Status: DC

## 2015-01-30 MED ORDER — ALPRAZOLAM 0.25 MG PO TABS: 0.2500 mg | ORAL_TABLET | ORAL | Status: DC

## 2015-01-30 NOTE — Progress Notes (Signed)
Name: Kaitlyn Oliver   MRN: [id number]    DOB: 1933-04-08   Date:01/30/2015       Progress Note  Subjective  Chief Complaint  Chief Complaint  Patient presents with  . Hypertension  . Anxiety    needs something to take "as needed"    Hypertension This is a chronic problem. The current episode started more than 1 year ago. The problem has been waxing and waning since onset. Associated symptoms include anxiety. Pertinent negatives include no blurred vision, chest pain, headaches, malaise/fatigue, neck pain, orthopnea, palpitations, peripheral edema, PND, shortness of breath or sweats. There are no associated agents to hypertension. Risk factors for coronary artery disease include obesity. Past treatments include angiotensin blockers and diuretics. The current treatment provides moderate improvement. There are no compliance problems.  There is no history of angina, kidney disease, CAD/MI, CVA, heart failure, left ventricular hypertrophy, PVD, renovascular disease or retinopathy. There is no history of chronic renal disease or a hypertension causing med.  Anxiety Presents for follow-up visit. Symptoms include excessive worry, irritability and nervous/anxious behavior. Patient reports no chest pain, dizziness, insomnia, nausea, palpitations, shortness of breath or suicidal ideas. Symptoms occur occasionally. The severity of symptoms is mild. The symptoms are aggravated by social activities. The quality of sleep is good. Nighttime awakenings: occasional.   Her past medical history is significant for anxiety/panic attacks. There is no history of depression or fibromyalgia. Past treatments include benzodiazephines.    No problem-specific assessment & plan notes found for this encounter.   Past Medical History  Diagnosis Date  . GERD (gastroesophageal reflux disease)   . Thyroid disease   . Depression     Past Surgical History  Procedure Laterality Date  . Vaginal hysterectomy    .  Rotator cuff repair Bilateral   . Mastectomy Bilateral   . Total knee arthroplasty Left   . Cholecystectomy    . Colonoscopy  2013    Dr Marva Panda  . Cataract extraction Bilateral     History reviewed. No pertinent family history.  Social History   Social History  . Marital Status: Widowed    Spouse Name: N/A  . Number of Children: N/A  . Years of Education: N/A   Occupational History  . Not on file.   Social History Main Topics  . Smoking status: Former Games developer  . Smokeless tobacco: Not on file  . Alcohol Use: No  . Drug Use: No  . Sexual Activity: Yes   Other Topics Concern  . Not on file   Social History Narrative    Allergies  Allergen Reactions  . Penicillin G Itching     Review of Systems  Constitutional: Positive for irritability. Negative for fever, chills, weight loss and malaise/fatigue.  HENT: Negative for ear discharge, ear pain and sore throat.   Eyes: Negative for blurred vision.  Respiratory: Negative for cough, sputum production, shortness of breath and wheezing.   Cardiovascular: Negative for chest pain, palpitations, orthopnea, leg swelling and PND.  Gastrointestinal: Negative for heartburn, nausea, abdominal pain, diarrhea, constipation, blood in stool and melena.  Genitourinary: Negative for dysuria, urgency, frequency and hematuria.  Musculoskeletal: Negative for myalgias, back pain, joint pain and neck pain.  Skin: Negative for rash.  Neurological: Negative for dizziness, tingling, sensory change, focal weakness and headaches.  Endo/Heme/Allergies: Negative for environmental allergies and polydipsia. Does not bruise/bleed easily.  Psychiatric/Behavioral: Negative for depression and suicidal ideas. The patient is nervous/anxious. The patient does not have insomnia.  Objective  Filed Vitals:   01/30/15 0804  BP: 132/80  Pulse: 80  Height: 5\' 5"  (1.651 m)  Weight: 211 lb (95.709 kg)    Physical Exam  Constitutional: She is  well-developed, well-nourished, and in no distress. No distress.  HENT:  Head: Normocephalic and atraumatic.  Right Ear: External ear normal.  Left Ear: External ear normal.  Nose: Nose normal.  Mouth/Throat: Oropharynx is clear and moist.  Eyes: Conjunctivae and EOM are normal. Pupils are equal, round, and reactive to light. Right eye exhibits no discharge. Left eye exhibits no discharge.  Neck: Normal range of motion. Neck supple. No JVD present. No thyromegaly present.  Cardiovascular: Normal rate, regular rhythm, normal heart sounds and intact distal pulses.  Exam reveals no gallop and no friction rub.   No murmur heard. Pulmonary/Chest: Effort normal and breath sounds normal.  Abdominal: Soft. Bowel sounds are normal. She exhibits no mass. There is no tenderness. There is no guarding.  Musculoskeletal: Normal range of motion. She exhibits no edema.  Lymphadenopathy:    She has no cervical adenopathy.  Neurological: She is alert. She has normal reflexes.  Skin: Skin is warm and dry. She is not diaphoretic.  Psychiatric: Mood and affect normal.      Assessment & Plan  Problem List Items Addressed This Visit      Cardiovascular and Mediastinum   Essential (primary) hypertension - Primary   Relevant Medications   losartan-hydrochlorothiazide (HYZAAR) 50-12.5 MG tablet    Other Visit Diagnoses    Anxiety and depression        Relevant Medications    sertraline (ZOLOFT) 50 MG tablet    Acute anxiety        Relevant Medications    sertraline (ZOLOFT) 50 MG tablet    ALPRAZolam (XANAX) 0.25 MG tablet         Dr. [facility]  01/30/2015

## 2015-05-06 DIAGNOSIS — M2011 Hallux valgus (acquired), right foot: Secondary | ICD-10-CM | POA: Diagnosis not present

## 2015-05-11 ENCOUNTER — Encounter: Payer: Self-pay | Admitting: Family Medicine

## 2015-05-11 ENCOUNTER — Ambulatory Visit (INDEPENDENT_AMBULATORY_CARE_PROVIDER_SITE_OTHER): Payer: Medicare Other | Admitting: Family Medicine

## 2015-05-11 VITALS — BP 130/80 | HR 64 | Ht 65.0 in | Wt 204.0 lb

## 2015-05-11 DIAGNOSIS — F419 Anxiety disorder, unspecified: Secondary | ICD-10-CM | POA: Diagnosis not present

## 2015-05-11 DIAGNOSIS — K219 Gastro-esophageal reflux disease without esophagitis: Secondary | ICD-10-CM

## 2015-05-11 DIAGNOSIS — I10 Essential (primary) hypertension: Secondary | ICD-10-CM

## 2015-05-11 DIAGNOSIS — Z01818 Encounter for other preprocedural examination: Secondary | ICD-10-CM | POA: Diagnosis not present

## 2015-05-11 DIAGNOSIS — R5382 Chronic fatigue, unspecified: Secondary | ICD-10-CM

## 2015-05-11 MED ORDER — BUSPIRONE HCL 5 MG PO TABS: 5.0000 mg | ORAL_TABLET | Freq: Two times a day (BID) | ORAL | Status: DC

## 2015-05-11 MED ORDER — OMEPRAZOLE 20 MG PO CPDR: 20.0000 mg | DELAYED_RELEASE_CAPSULE | Freq: Every day | ORAL | Status: DC

## 2015-05-11 MED ORDER — CHLORTHALIDONE 25 MG PO TABS: 25.0000 mg | ORAL_TABLET | Freq: Every day | ORAL | Status: DC

## 2015-05-11 NOTE — Progress Notes (Signed)
Name: Kaitlyn Oliver   MRN: [id number]    DOB: 02-18-1934   Date:05/11/2015       Progress Note  Subjective  Chief Complaint  Chief Complaint  Patient presents with  . pre-op clearance    having foot surgery  . Follow-up    wants a B12 drawn- has been taking OTC B12 gummies    HPI Comments: Patient presents for presurgical evaluation.  Patient concerns about recurrent fatigue.    Gastroesophageal Reflux She reports no abdominal pain, no belching, no chest pain, no choking, no coughing, no dysphagia, no early satiety, no globus sensation, no heartburn, no hoarse voice, no nausea, no sore throat, no stridor, no tooth decay, no water brash or no wheezing. This is a chronic problem. The current episode started more than 1 year ago. The problem occurs frequently. The problem has been waxing and waning. The symptoms are aggravated by certain foods. Associated symptoms include fatigue and weight loss. Pertinent negatives include no anemia, melena, muscle weakness or orthopnea. She has tried a PPI for the symptoms. The treatment provided mild relief.  Thyroid Problem Presents for follow-up visit. Symptoms include fatigue, heat intolerance and weight loss. Patient reports no cold intolerance, constipation, depressed mood, dry skin, hair loss, hoarse voice, leg swelling, palpitations, visual change or weight gain. The symptoms have been improving. Treatments tried: oral B12. The treatment provided mild relief.  Hypertension This is a chronic problem. The current episode started more than 1 year ago. The problem has been waxing and waning since onset. The problem is controlled. Pertinent negatives include no anxiety, blurred vision, chest pain, headaches, malaise/fatigue, neck pain, orthopnea, palpitations, peripheral edema, PND, shortness of breath or sweats. Past treatments include ACE inhibitors. Hypertensive end-organ damage includes a thyroid problem.    No problem-specific assessment & plan  notes found for this encounter.   Past Medical History  Diagnosis Date  . GERD (gastroesophageal reflux disease)   . Thyroid disease   . Depression     Past Surgical History  Procedure Laterality Date  . Vaginal hysterectomy    . Rotator cuff repair Bilateral   . Mastectomy Bilateral   . Total knee arthroplasty Left   . Cholecystectomy    . Colonoscopy  2013    Dr Gustavo Lah  . Cataract extraction Bilateral     History reviewed. No pertinent family history.  Social History   Social History  . Marital Status: Widowed    Spouse Name: N/A  . Number of Children: N/A  . Years of Education: N/A   Occupational History  . Not on file.   Social History Main Topics  . Smoking status: Former Research scientist (life sciences)  . Smokeless tobacco: Not on file  . Alcohol Use: No  . Drug Use: No  . Sexual Activity: Yes   Other Topics Concern  . Not on file   Social History Narrative    Allergies  Allergen Reactions  . Penicillin G Itching     Review of Systems  Constitutional: Positive for weight loss and fatigue. Negative for weight gain and malaise/fatigue.  HENT: Negative for hoarse voice and sore throat.   Eyes: Negative for blurred vision.  Respiratory: Negative for cough, choking, shortness of breath and wheezing.   Cardiovascular: Negative for chest pain, palpitations, orthopnea and PND.  Gastrointestinal: Negative for heartburn, dysphagia, nausea, abdominal pain, constipation and melena.  Musculoskeletal: Negative for muscle weakness and neck pain.  Neurological: Negative for headaches.  Endo/Heme/Allergies: Positive for heat intolerance. Negative for cold  intolerance.     Objective  Filed Vitals:   05/11/15 1420  BP: 130/80  Pulse: 64  Height: 5\' 5"  (1.651 m)  Weight: 204 lb (92.534 kg)    Physical Exam  Constitutional: She is well-developed, well-nourished, and in no distress. No distress.  HENT:  Head: Normocephalic and atraumatic.  Right Ear: External ear normal.   Left Ear: External ear normal.  Nose: Nose normal.  Mouth/Throat: Oropharynx is clear and moist.  Eyes: Conjunctivae and EOM are normal. Pupils are equal, round, and reactive to light. Right eye exhibits no discharge. Left eye exhibits no discharge.  Neck: Normal range of motion. Neck supple. No JVD present. No thyromegaly present.  Cardiovascular: Normal rate, regular rhythm, normal heart sounds and intact distal pulses.  Exam reveals no gallop and no friction rub.   No murmur heard. Pulmonary/Chest: Effort normal and breath sounds normal.  Abdominal: Soft. Bowel sounds are normal. She exhibits no mass. There is no tenderness. There is no guarding.  Musculoskeletal: Normal range of motion. She exhibits no edema.  Lymphadenopathy:    She has no cervical adenopathy.  Neurological: She is alert. She has normal reflexes.  Skin: Skin is warm and dry. She is not diaphoretic.  Psychiatric: Mood and affect normal.      Assessment & Plan  Problem List Items Addressed This Visit    None    Visit Diagnoses    Preoperative evaluation to rule out surgical contraindication    -  Primary    Gastroesophageal reflux disease, esophagitis presence not specified        Relevant Medications    omeprazole (PRILOSEC) 20 MG capsule    Chronic anxiety        Relevant Medications    busPIRone (BUSPAR) 5 MG tablet    Chronic fatigue        Relevant Orders    TSH    Essential hypertension        Relevant Medications    chlorthalidone (HYGROTON) 25 MG tablet      No contraindications for surgery. More than 30 minutes was spent with patient discussing med changes and B12 deficiency   Dr. Otilio Miu [facility]  05/11/2015

## 2015-05-12 ENCOUNTER — Other Ambulatory Visit: Payer: Self-pay

## 2015-05-12 LAB — TSH: TSH: 4.72 u[IU]/mL — ABNORMAL HIGH (ref 0.450–4.500)

## 2015-05-12 MED ORDER — LEVOTHYROXINE SODIUM 50 MCG PO TABS: 50.0000 ug | ORAL_TABLET | Freq: Every day | ORAL | Status: DC

## 2015-05-19 ENCOUNTER — Other Ambulatory Visit: Payer: Self-pay

## 2015-05-25 DIAGNOSIS — M2011 Hallux valgus (acquired), right foot: Secondary | ICD-10-CM | POA: Diagnosis not present

## 2015-05-26 ENCOUNTER — Encounter: Payer: Self-pay | Admitting: *Deleted

## 2015-05-28 NOTE — Discharge Instructions (Signed)
[facility] °[facility] ° °POST OPERATIVE INSTRUCTIONS FOR DR. TROXLER AND DR. FOWLER °[facility] ° ° °1. Take your medication as prescribed.  Pain medication should be taken only as needed. ° °2. Keep the dressing clean, dry and intact. ° °3. Keep your foot elevated above the heart level for the first 48 hours. ° °4. Walking to the bathroom and brief periods of walking are acceptable, unless we have instructed you to be non-weight bearing. ° °5. Always wear your post-op shoe when walking.  Always use your crutches if you are to be non-weight bearing. ° °6. Do not take a shower. Baths are permissible as long as the foot is kept out of the water.  ° °7. Every hour you are awake:  °- Bend your knee 15 times. °- Flex foot 15 times °- Massage calf 15 times ° °8. Call [facility] ([phone]) if any of the following problems occur: °- You develop a temperature or fever. °- The bandage becomes saturated with blood. °- Medication does not stop your pain. °- Injury of the foot occurs. °- Any symptoms of infection including redness, odor, or red streaks running from wound. ° °General Anesthesia, Adult, Care After °Refer to this sheet in the next few weeks. These instructions provide you with information on caring for yourself after your procedure. Your health care provider may also give you more specific instructions. Your treatment has been planned according to current medical practices, but problems sometimes occur. Call your health care provider if you have any problems or questions after your procedure. °WHAT TO EXPECT AFTER THE PROCEDURE °After the procedure, it is typical to experience: °· Sleepiness. °· Nausea and vomiting. °HOME CARE INSTRUCTIONS °· For the first 24 hours after general anesthesia: °¨ Have a responsible person with you. °¨ Do not drive a car. If you are alone, do not take public transportation. °¨ Do not drink alcohol. °¨ Do not take  medicine that has not been prescribed by your health care provider. °¨ Do not sign important papers or make important decisions. °¨ You may resume a normal diet and activities as directed by your health care provider. °· Change bandages (dressings) as directed. °· If you have questions or problems that seem related to general anesthesia, call the hospital and ask for the anesthetist or anesthesiologist on call. °SEEK MEDICAL CARE IF: °· You have nausea and vomiting that continue the day after anesthesia. °· You develop a rash. °SEEK IMMEDIATE MEDICAL CARE IF:  °· You have difficulty breathing. °· You have chest pain. °· You have any allergic problems. °  °This information is not intended to replace advice given to you by your health care provider. Make sure you discuss any questions you have with your health care provider. °  °Document Released: 06/06/2000 Document Revised: 03/21/2014 Document Reviewed: 06/29/2011 °Elsevier Interactive Patient Education ©2016 [employer] ° °

## 2015-06-03 ENCOUNTER — Ambulatory Visit: Payer: Medicare Other | Admitting: Anesthesiology

## 2015-06-03 ENCOUNTER — Encounter
Admission: RE | Disposition: A | Discharge status: Home / Self Care | Payer: Self-pay | Source: Ambulatory Visit | Attending: Podiatry

## 2015-06-03 ENCOUNTER — Ambulatory Visit
Admission: RE | Admit: 2015-06-03 | Discharge: 2015-06-03 | Disposition: A | Discharge status: Home / Self Care | Payer: Medicare Other | Source: Ambulatory Visit | Attending: Podiatry | Admitting: Podiatry

## 2015-06-03 DIAGNOSIS — F329 Major depressive disorder, single episode, unspecified: Secondary | ICD-10-CM | POA: Diagnosis not present

## 2015-06-03 DIAGNOSIS — Z96652 Presence of left artificial knee joint: Secondary | ICD-10-CM | POA: Insufficient documentation

## 2015-06-03 DIAGNOSIS — M2011 Hallux valgus (acquired), right foot: Secondary | ICD-10-CM | POA: Diagnosis not present

## 2015-06-03 DIAGNOSIS — Z9049 Acquired absence of other specified parts of digestive tract: Secondary | ICD-10-CM | POA: Insufficient documentation

## 2015-06-03 DIAGNOSIS — K219 Gastro-esophageal reflux disease without esophagitis: Secondary | ICD-10-CM | POA: Diagnosis not present

## 2015-06-03 DIAGNOSIS — Z9842 Cataract extraction status, left eye: Secondary | ICD-10-CM | POA: Diagnosis not present

## 2015-06-03 DIAGNOSIS — Z9841 Cataract extraction status, right eye: Secondary | ICD-10-CM | POA: Insufficient documentation

## 2015-06-03 DIAGNOSIS — Z87891 Personal history of nicotine dependence: Secondary | ICD-10-CM | POA: Insufficient documentation

## 2015-06-03 DIAGNOSIS — E039 Hypothyroidism, unspecified: Secondary | ICD-10-CM | POA: Insufficient documentation

## 2015-06-03 DIAGNOSIS — Z90710 Acquired absence of both cervix and uterus: Secondary | ICD-10-CM | POA: Diagnosis not present

## 2015-06-03 DIAGNOSIS — I10 Essential (primary) hypertension: Secondary | ICD-10-CM | POA: Insufficient documentation

## 2015-06-03 HISTORY — DX: Essential (primary) hypertension: I10

## 2015-06-03 HISTORY — PX: HALLUX VALGUS AUSTIN: SHX6623

## 2015-06-03 HISTORY — DX: Hypothyroidism, unspecified: E03.9

## 2015-06-03 HISTORY — DX: Presence of dental prosthetic device (complete) (partial): Z97.2

## 2015-06-03 HISTORY — DX: Headache, unspecified: R51.9

## 2015-06-03 HISTORY — DX: Headache: R51

## 2015-06-03 HISTORY — DX: Unspecified osteoarthritis, unspecified site: M19.90

## 2015-06-03 SURGERY — CORRECTION, HALLUX VALGUS
Anesthesia: Regional | Laterality: Right | Wound class: Clean

## 2015-06-03 MED ORDER — FENTANYL CITRATE (PF) 100 MCG/2ML IJ SOLN
INTRAMUSCULAR | Status: DC | PRN
  Administered 2015-06-03: 50 ug via INTRAVENOUS

## 2015-06-03 MED ORDER — ONDANSETRON HCL 4 MG/2ML IJ SOLN: 4.0000 mg | Freq: Four times a day (QID) | INTRAMUSCULAR | Status: DC | PRN

## 2015-06-03 MED ORDER — PROPOFOL 500 MG/50ML IV EMUL
INTRAVENOUS | Status: DC | PRN
  Administered 2015-06-03: 50 ug/kg/min via INTRAVENOUS

## 2015-06-03 MED ORDER — LACTATED RINGERS IV SOLN
INTRAVENOUS | Status: DC
  Administered 2015-06-03: 11:00:00 via INTRAVENOUS

## 2015-06-03 MED ORDER — LIDOCAINE HCL (CARDIAC) 20 MG/ML IV SOLN
INTRAVENOUS | Status: DC | PRN
  Administered 2015-06-03: 50 mg via INTRAVENOUS

## 2015-06-03 MED ORDER — ONDANSETRON HCL 4 MG PO TABS: 4.0000 mg | ORAL_TABLET | Freq: Four times a day (QID) | ORAL | Status: DC | PRN

## 2015-06-03 MED ORDER — CLINDAMYCIN PHOSPHATE 600 MG/50ML IV SOLN
600.0000 mg | Freq: Once | INTRAVENOUS | Status: AC
  Administered 2015-06-03: 600 mg via INTRAVENOUS

## 2015-06-03 MED ORDER — OXYCODONE HCL 5 MG PO TABS: 5.0000 mg | ORAL_TABLET | Freq: Once | ORAL | Status: DC | PRN

## 2015-06-03 MED ORDER — FENTANYL CITRATE (PF) 100 MCG/2ML IJ SOLN: 25.0000 ug | INTRAMUSCULAR | Status: DC | PRN

## 2015-06-03 MED ORDER — BUPIVACAINE HCL (PF) 0.25 % IJ SOLN
INTRAMUSCULAR | Status: DC | PRN
  Administered 2015-06-03: 10 mL

## 2015-06-03 MED ORDER — OXYCODONE-ACETAMINOPHEN 5-325 MG PO TABS: 1.0000 | ORAL_TABLET | ORAL | Status: DC | PRN

## 2015-06-03 MED ORDER — OXYCODONE HCL 5 MG/5ML PO SOLN: 5.0000 mg | Freq: Once | ORAL | Status: DC | PRN

## 2015-06-03 MED ORDER — LIDOCAINE HCL (PF) 1 % IJ SOLN
INTRAMUSCULAR | Status: DC | PRN
  Administered 2015-06-03: 10 mL via INTRADERMAL

## 2015-06-03 SURGICAL SUPPLY — 44 items
APL SKNCLS STERI-STRIP NONHPOA (GAUZE/BANDAGES/DRESSINGS) ×1
BANDAGE ELASTIC 4 VELCRO NS (GAUZE/BANDAGES/DRESSINGS) ×2 IMPLANT
BENZOIN TINCTURE PRP APPL 2/3 (GAUZE/BANDAGES/DRESSINGS) ×2 IMPLANT
BLADE MED AGGRESSIVE (BLADE) IMPLANT
BLADE OSC/SAGITTAL MD 5.5X18 (BLADE) IMPLANT
BLADE OSC/SAGITTAL MD 9X18.5 (BLADE) ×1 IMPLANT
BLADE SURG 15 STRL LF DISP TIS (BLADE) IMPLANT
BLADE SURG 15 STRL SS (BLADE)
BNDG CMPR 75X41 PLY HI ABS (GAUZE/BANDAGES/DRESSINGS) ×1
BNDG ESMARK 4X12 TAN STRL LF (GAUZE/BANDAGES/DRESSINGS) ×2 IMPLANT
BNDG GAUZE 4.5X4.1 6PLY STRL (MISCELLANEOUS) ×2 IMPLANT
BNDG STRETCH 4X75 STRL LF (GAUZE/BANDAGES/DRESSINGS) ×2 IMPLANT
CANISTER SUCT 1200ML W/VALVE (MISCELLANEOUS) ×2 IMPLANT
COVER LIGHT HANDLE UNIVERSAL (MISCELLANEOUS) ×4 IMPLANT
CUFF TOURN SGL QUICK 18 (TOURNIQUET CUFF) ×1 IMPLANT
DRAPE FLUOR MINI C-ARM 54X84 (DRAPES) ×2 IMPLANT
DURAPREP 26ML APPLICATOR (WOUND CARE) ×2 IMPLANT
GAUZE PETRO XEROFOAM 1X8 (MISCELLANEOUS) ×2 IMPLANT
GAUZE SPONGE 4X4 12PLY STRL (GAUZE/BANDAGES/DRESSINGS) ×2 IMPLANT
GLOVE BIO SURGEON STRL SZ7.5 (GLOVE) ×2 IMPLANT
GLOVE INDICATOR 8.0 STRL GRN (GLOVE) ×2 IMPLANT
GOWN STRL REUS W/ TWL LRG LVL3 (GOWN DISPOSABLE) ×2 IMPLANT
GOWN STRL REUS W/TWL LRG LVL3 (GOWN DISPOSABLE) ×4
K-WIRE DBL END TROCAR 6X.045 (WIRE) ×2
K-WIRE DBL END TROCAR 6X.062 (WIRE) ×2
K-wire (Wire) ×1 IMPLANT
KIT ROOM TURNOVER OR (KITS) ×2 IMPLANT
KWIRE DBL END TROCAR 6X.045 (WIRE) IMPLANT
KWIRE DBL END TROCAR 6X.062 (WIRE) IMPLANT
NS IRRIG 500ML POUR BTL (IV SOLUTION) ×2 IMPLANT
PACK EXTREMITY ARMC (MISCELLANEOUS) ×2 IMPLANT
PAD GROUND ADULT SPLIT (MISCELLANEOUS) ×2 IMPLANT
PIN BALLS 3/8 F/.045 WIRE (MISCELLANEOUS) ×1 IMPLANT
RASP SM TEAR CROSS CUT (RASP) ×1 IMPLANT
STOCKINETTE STRL 6IN 960660 (GAUZE/BANDAGES/DRESSINGS) ×2 IMPLANT
STRAP BODY AND KNEE 60X3 (MISCELLANEOUS) ×2 IMPLANT
STRIP CLOSURE SKIN 1/4X4 (GAUZE/BANDAGES/DRESSINGS) ×2 IMPLANT
SUT MNCRL 5-0+ PC-1 (SUTURE) IMPLANT
SUT MONOCRYL 5-0 (SUTURE) ×1
SUT VIC AB 2-0 SH 27 (SUTURE)
SUT VIC AB 2-0 SH 27XBRD (SUTURE) IMPLANT
SUT VIC AB 3-0 SH 27 (SUTURE) ×2
SUT VIC AB 3-0 SH 27X BRD (SUTURE) IMPLANT
SUT VIC AB 4-0 FS2 27 (SUTURE) ×1 IMPLANT

## 2015-06-03 NOTE — Transfer of Care (Signed)
Immediate Anesthesia Transfer of Care Note  Patient: Kaitlyn Oliver  Procedure(s) Performed: Procedure(s) with comments: [place] (Right) - WITH POPLITEAL  Patient Location: PACU  Anesthesia Type: Regional  Level of Consciousness: awake, alert  and patient cooperative  Airway and Oxygen Therapy: Patient Spontanous Breathing and Patient connected to supplemental oxygen  Post-op Assessment: Post-op Vital signs reviewed, Patient's Cardiovascular Status Stable, Respiratory Function Stable, Patent Airway and No signs of Nausea or vomiting  Post-op Vital Signs: Reviewed and stable  Complications: No apparent anesthesia complications

## 2015-06-03 NOTE — Progress Notes (Signed)
Assisted Scott Mculloch ANMD with popliteal block. Side rails up, monitors on throughout procedure. See vital signs in flow sheet. Tolerated Procedure well.

## 2015-06-03 NOTE — H&P (Signed)
  HISTORY AND PHYSICAL INTERVAL NOTE:  06/03/2015  11:23 AM  Kaitlyn Oliver  has presented today for surgery, with the diagnosis of M20.11 [place].  The various methods of treatment have been discussed with the patient.  No guarantees were given.  After consideration of risks, benefits and other options for treatment, the patient has consented to surgery.  I have reviewed the patients' chart and labs.    No data found.   A history and physical examination was performed in my office.  The patient was reexamined.  There have been no changes to this history and physical examination.  Samara Deist A

## 2015-06-03 NOTE — Anesthesia Procedure Notes (Signed)
Procedure Name: MAC Performed by: Isom Kochan Pre-anesthesia Checklist: Patient identified, Emergency Drugs available, Suction available, Timeout performed and Patient being monitored Patient Re-evaluated:Patient Re-evaluated prior to inductionOxygen Delivery Method: Nasal cannula Placement Confirmation: positive ETCO2     

## 2015-06-03 NOTE — Anesthesia Postprocedure Evaluation (Signed)
Anesthesia Post Note  Patient: Kaitlyn Oliver  Procedure(s) Performed: Procedure(s) (LRB): HALLUX VALGUS [place]IN (Right)  Patient location during evaluation: PACU Anesthesia Type: General Level of consciousness: awake and alert Pain management: pain level controlled Vital Signs Assessment: post-procedure vital signs reviewed and stable Respiratory status: spontaneous breathing, nonlabored ventilation, respiratory function stable and patient connected to nasal cannula oxygen Cardiovascular status: blood pressure returned to baseline and stable Postop Assessment: no signs of nausea or vomiting Anesthetic complications: no    Marshell Levan

## 2015-06-03 NOTE — Anesthesia Preprocedure Evaluation (Signed)
Anesthesia Evaluation  Patient identified by MRN, date of birth, ID band Patient awake    Airway Mallampati: II  TM Distance: >3 FB Neck ROM: Full    Dental  (+) Upper Dentures, Lower Dentures   Pulmonary former smoker,    Pulmonary exam normal        Cardiovascular hypertension, Normal cardiovascular exam     Neuro/Psych    GI/Hepatic GERD  Medicated,  Endo/Other  Hypothyroidism   Renal/GU      Musculoskeletal   Abdominal   Peds  Hematology   Anesthesia Other Findings   Reproductive/Obstetrics                             Anesthesia Physical Anesthesia Plan  ASA: II  Anesthesia Plan: Regional   Post-op Pain Management:    Induction: Intravenous  Airway Management Planned:   Additional Equipment:   Intra-op Plan:   Post-operative Plan:   Informed Consent: I have reviewed the patients History and Physical, chart, labs and discussed the procedure including the risks, benefits and alternatives for the proposed anesthesia with the patient or authorized representative who has indicated his/her understanding and acceptance.     Plan Discussed with: CRNA  Anesthesia Plan Comments:         Anesthesia Quick Evaluation

## 2015-06-03 NOTE — Op Note (Signed)
Operative note   Surgeon:Joanthan Hlavacek Lawyer: None    Preop diagnosis: Hallux valgus right foot    Postop diagnosis: Same    Procedure: [place] hallux valgus correction right foot    EBL: Minimal    Anesthesia:regional and IV sedation    Hemostasis: Ankle tourniquet inflated to 250 mmHg for approximately 40 minutes    Specimen: None    Complications: None    Operative indications:Kaitlyn Oliver is an 82 y.o. that presents today for surgical intervention.  The risks/benefits/alternatives/complications have been discussed and consent has been given.    Procedure:  Patient was brought into the OR and placed on the operating table in thesupine position. After anesthesia was obtained theright lower extremity was prepped and draped in usual sterile fashion.  Attention was directed to the dorsomedial right first MTPJ where a longitudinal incision was made. Sharp and blunt dissection was carried down to the capsule. Next a T capsulotomy was then performed. The head of the first metatarsal was exposed. The prominent dorsal medial eminence was transected with a power saw. Next a V osteotomy was created from medial to lateral. The capital fragment was translocated laterally. This was stabilized with a 0.062 K wire. The ensuing overhanging ledge was transected. All areas were smoothed with a power rasp. Good realignment of the first MTPJ was noted. A small capsulorrhaphy was performed medially. Closure was then performed after copious amounts or irrigation with a 3-0 Vicryl for the subcutaneous tissue and capsule. 4-0 Vicryl and 5-0 Monocryl for the subcutaneous tissue and skin. 0.25% Marcaine was placed around all areas. A well compressive sterile dressing was then applied.    Patient tolerated the procedure and anesthesia well.  Was transported from the OR to the PACU with all vital signs stable and vascular status intact. To be discharged per routine protocol.  Will follow up in  approximately 1 week in the outpatient clinic.

## 2015-06-04 ENCOUNTER — Encounter: Payer: Self-pay | Admitting: Podiatry

## 2015-06-08 DIAGNOSIS — M2011 Hallux valgus (acquired), right foot: Secondary | ICD-10-CM | POA: Diagnosis not present

## 2015-06-17 DIAGNOSIS — L820 Inflamed seborrheic keratosis: Secondary | ICD-10-CM | POA: Diagnosis not present

## 2015-06-17 DIAGNOSIS — S1080XA Unspecified superficial injury of other specified part of neck, initial encounter: Secondary | ICD-10-CM | POA: Diagnosis not present

## 2015-06-17 DIAGNOSIS — L538 Other specified erythematous conditions: Secondary | ICD-10-CM | POA: Diagnosis not present

## 2015-06-17 DIAGNOSIS — D2362 Other benign neoplasm of skin of left upper limb, including shoulder: Secondary | ICD-10-CM | POA: Diagnosis not present

## 2015-06-17 DIAGNOSIS — L821 Other seborrheic keratosis: Secondary | ICD-10-CM | POA: Diagnosis not present

## 2015-06-17 DIAGNOSIS — D225 Melanocytic nevi of trunk: Secondary | ICD-10-CM | POA: Diagnosis not present

## 2015-06-22 ENCOUNTER — Ambulatory Visit (INDEPENDENT_AMBULATORY_CARE_PROVIDER_SITE_OTHER): Payer: Medicare Other | Admitting: Family Medicine

## 2015-06-22 ENCOUNTER — Encounter: Payer: Self-pay | Admitting: Family Medicine

## 2015-06-22 VITALS — BP 130/80 | HR 64 | Ht 65.0 in | Wt 200.0 lb

## 2015-06-22 DIAGNOSIS — F419 Anxiety disorder, unspecified: Secondary | ICD-10-CM

## 2015-06-22 DIAGNOSIS — E039 Hypothyroidism, unspecified: Secondary | ICD-10-CM | POA: Diagnosis not present

## 2015-06-22 MED ORDER — BUSPIRONE HCL 5 MG PO TABS: 5.0000 mg | ORAL_TABLET | Freq: Two times a day (BID) | ORAL | Status: DC

## 2015-06-22 MED ORDER — LEVOTHYROXINE SODIUM 50 MCG PO TABS: 50.0000 ug | ORAL_TABLET | Freq: Every day | ORAL | Status: DC

## 2015-06-22 NOTE — Progress Notes (Signed)
Name: Kaitlyn Oliver   MRN: [id number]    DOB: 09/09/1933   Date:06/22/2015       Progress Note  Subjective  Chief Complaint  Chief Complaint  Patient presents with  . Follow-up    anxiety and thyroid- needs labs    Thyroid Problem Presents for follow-up visit. Symptoms include hair loss and heat intolerance. Patient reports no anxiety, cold intolerance, constipation, depressed mood, diaphoresis, diarrhea, dry skin, fatigue, hoarse voice, leg swelling, menstrual problem, nail problem, palpitations, tremors, visual change, weight gain or weight loss. The symptoms have been stable. Past treatments include levothyroxine. The treatment provided moderate relief. There is no history of atrial fibrillation, dementia, diabetes, Graves' ophthalmopathy, heart failure, hyperlipidemia, neuropathy, obesity or osteopenia.    No problem-specific assessment & plan notes found for this encounter.   Past Medical History  Diagnosis Date  . GERD (gastroesophageal reflux disease)   . Thyroid disease   . Depression   . Hypothyroidism   . Hypertension   . Arthritis     thumbs, hands  . Wears dentures     full upper, partial lower  . Headache     occasional - pinched nerve in neck    Past Surgical History  Procedure Laterality Date  . Vaginal hysterectomy    . Rotator cuff repair Bilateral   . Total knee arthroplasty Left   . Cholecystectomy    . Colonoscopy  2013    Dr Gustavo Lah  . Cataract extraction Bilateral   . Mastectomy Bilateral   . Tonsillectomy    . Hallux valgus [place] Right 06/03/2015    Procedure: HALLUX VALGUS AUSTIN;  Surgeon: Samara Deist, DPM;  Location: [place];  Service: Podiatry;  Laterality: Right;  WITH POPLITEAL    History reviewed. No pertinent family history.  Social History   Social History  . Marital Status: Widowed    Spouse Name: N/A  . Number of Children: N/A  . Years of Education: N/A   Occupational History  . Not on file.   Social  History Main Topics  . Smoking status: Former Research scientist (life sciences)  . Smokeless tobacco: Not on file     Comment: quit 30+ yrs ago  . Alcohol Use: No  . Drug Use: No  . Sexual Activity: Yes   Other Topics Concern  . Not on file   Social History Narrative    Allergies  Allergen Reactions  . Penicillin G Itching     Review of Systems  Constitutional: Negative for fever, chills, weight loss, weight gain, malaise/fatigue, diaphoresis and fatigue.  HENT: Negative for ear discharge, ear pain, hoarse voice and sore throat.   Eyes: Negative for blurred vision.  Respiratory: Negative for cough, sputum production, shortness of breath and wheezing.   Cardiovascular: Negative for chest pain, palpitations and leg swelling.  Gastrointestinal: Negative for heartburn, nausea, abdominal pain, diarrhea, constipation, blood in stool and melena.  Genitourinary: Negative for dysuria, urgency, frequency, hematuria and menstrual problem.  Musculoskeletal: Negative for myalgias, back pain, joint pain and neck pain.  Skin: Negative for rash.  Neurological: Negative for dizziness, tingling, tremors, sensory change, focal weakness and headaches.  Endo/Heme/Allergies: Positive for heat intolerance. Negative for environmental allergies, cold intolerance and polydipsia. Does not bruise/bleed easily.  Psychiatric/Behavioral: Negative for depression and suicidal ideas. The patient is not nervous/anxious and does not have insomnia.      Objective  Filed Vitals:   06/22/15 0953  BP: 130/80  Pulse: 64  Height: 5\' 5"  (1.651 m)  Weight: 200 lb (90.719 kg)    Physical Exam  Constitutional: She is well-developed, well-nourished, and in no distress. No distress.  HENT:  Head: Normocephalic and atraumatic.  Right Ear: External ear normal.  Left Ear: External ear normal.  Nose: Nose normal.  Mouth/Throat: Oropharynx is clear and moist.  Eyes: Conjunctivae and EOM are normal. Pupils are equal, round, and reactive to  light. Right eye exhibits no discharge. Left eye exhibits no discharge.  Neck: Normal range of motion. Neck supple. No JVD present. No thyromegaly present.  Cardiovascular: Normal rate, regular rhythm, normal heart sounds and intact distal pulses.  Exam reveals no gallop and no friction rub.   No murmur heard. Pulmonary/Chest: Effort normal and breath sounds normal.  Abdominal: Soft. Bowel sounds are normal. She exhibits no mass. There is no tenderness. There is no guarding.  Musculoskeletal: Normal range of motion. She exhibits no edema.  Lymphadenopathy:    She has no cervical adenopathy.  Neurological: She is alert.  Skin: Skin is warm and dry. She is not diaphoretic.  Psychiatric: Mood and affect normal.  Nursing note and vitals reviewed.     Assessment & Plan  Problem List Items Addressed This Visit      Endocrine   Hypothyroidism - Primary   Relevant Medications   levothyroxine (SYNTHROID, LEVOTHROID) 50 MCG tablet   Other Relevant Orders   TSH    Other Visit Diagnoses    Chronic anxiety        Relevant Medications    busPIRone (BUSPAR) 5 MG tablet         Dr. Deanna Jones [facility]  06/22/2015

## 2015-06-23 LAB — TSH: TSH: 3.35 u[IU]/mL (ref 0.450–4.500)

## 2015-06-25 ENCOUNTER — Other Ambulatory Visit: Payer: Self-pay

## 2015-06-25 DIAGNOSIS — F419 Anxiety disorder, unspecified: Secondary | ICD-10-CM

## 2015-06-25 MED ORDER — BUSPIRONE HCL 5 MG PO TABS: 5.0000 mg | ORAL_TABLET | Freq: Two times a day (BID) | ORAL | Status: DC

## 2015-07-13 ENCOUNTER — Other Ambulatory Visit: Payer: Self-pay | Admitting: Family Medicine

## 2015-07-15 DIAGNOSIS — M2011 Hallux valgus (acquired), right foot: Secondary | ICD-10-CM | POA: Diagnosis not present

## 2015-08-19 DIAGNOSIS — M2011 Hallux valgus (acquired), right foot: Secondary | ICD-10-CM | POA: Diagnosis not present

## 2015-10-05 ENCOUNTER — Other Ambulatory Visit: Payer: Self-pay

## 2015-10-05 MED ORDER — CHLORTHALIDONE 25 MG PO TABS: 25.0000 mg | ORAL_TABLET | Freq: Every day | ORAL | 0 refills | Status: DC

## 2015-12-08 DIAGNOSIS — Z23 Encounter for immunization: Secondary | ICD-10-CM | POA: Diagnosis not present

## 2015-12-10 ENCOUNTER — Other Ambulatory Visit: Payer: Self-pay

## 2015-12-22 ENCOUNTER — Encounter: Payer: Self-pay | Admitting: Family Medicine

## 2015-12-22 ENCOUNTER — Ambulatory Visit (INDEPENDENT_AMBULATORY_CARE_PROVIDER_SITE_OTHER): Payer: Medicare Other | Admitting: Family Medicine

## 2015-12-22 VITALS — BP 124/82 | HR 78 | Ht 65.0 in | Wt 211.0 lb

## 2015-12-22 DIAGNOSIS — I10 Essential (primary) hypertension: Secondary | ICD-10-CM

## 2015-12-22 DIAGNOSIS — K219 Gastro-esophageal reflux disease without esophagitis: Secondary | ICD-10-CM | POA: Diagnosis not present

## 2015-12-22 DIAGNOSIS — E038 Other specified hypothyroidism: Secondary | ICD-10-CM

## 2015-12-22 DIAGNOSIS — F419 Anxiety disorder, unspecified: Secondary | ICD-10-CM

## 2015-12-22 DIAGNOSIS — E063 Autoimmune thyroiditis: Secondary | ICD-10-CM | POA: Diagnosis not present

## 2015-12-22 DIAGNOSIS — K279 Peptic ulcer, site unspecified, unspecified as acute or chronic, without hemorrhage or perforation: Secondary | ICD-10-CM | POA: Diagnosis not present

## 2015-12-22 MED ORDER — BUSPIRONE HCL 5 MG PO TABS: 5.0000 mg | ORAL_TABLET | Freq: Two times a day (BID) | ORAL | 1 refills | Status: DC

## 2015-12-22 MED ORDER — LEVOTHYROXINE SODIUM 50 MCG PO TABS: 50.0000 ug | ORAL_TABLET | Freq: Every day | ORAL | 1 refills | Status: DC

## 2015-12-22 MED ORDER — CHLORTHALIDONE 25 MG PO TABS: 25.0000 mg | ORAL_TABLET | Freq: Every day | ORAL | 1 refills | Status: DC

## 2015-12-22 MED ORDER — OMEPRAZOLE 20 MG PO CPDR: 20.0000 mg | DELAYED_RELEASE_CAPSULE | Freq: Every day | ORAL | 11 refills | Status: DC

## 2015-12-22 MED ORDER — TRIAMCINOLONE ACETONIDE 55 MCG/ACT NA AERO: 2.0000 | INHALATION_SPRAY | Freq: Every day | NASAL | 11 refills | Status: DC

## 2015-12-22 NOTE — Progress Notes (Signed)
Name: Kaitlyn Oliver   MRN: [id number]    DOB: 07-08-1933   Date:12/22/2015       Progress Note  Subjective  Chief Complaint  Chief Complaint  Patient presents with  . Hypertension  . Hypothyroidism    needs to discuss dosing on medication  . Gastroesophageal Reflux    Hypertension  This is a chronic problem. The current episode started more than 1 year ago. The problem has been waxing and waning since onset. The problem is controlled. Associated symptoms include neck pain and sweats. Pertinent negatives include no anxiety, blurred vision, chest pain, headaches, malaise/fatigue, orthopnea, palpitations, peripheral edema, PND or shortness of breath. There are no associated agents to hypertension. Risk factors for coronary artery disease include obesity and dyslipidemia. Past treatments include diuretics. The current treatment provides moderate improvement. There are no compliance problems.  Hypertensive end-organ damage includes a thyroid problem. There is no history of angina, kidney disease, CAD/MI, CVA, heart failure, left ventricular hypertrophy, PVD, renovascular disease or retinopathy. There is no history of chronic renal disease or a hypertension causing med.  Gastroesophageal Reflux  She reports no abdominal pain, no belching, no chest pain, no choking, no coughing, no dysphagia, no early satiety, no globus sensation, no heartburn, no hoarse voice, no nausea, no sore throat, no stridor or no wheezing. This is a recurrent problem. The problem has been waxing and waning. The symptoms are aggravated by certain foods (miss a dosage). Pertinent negatives include no fatigue, melena or weight loss. There are no known risk factors. She has tried a PPI for the symptoms. The treatment provided mild relief. Past procedures do not include an abdominal ultrasound, an EGD, esophageal manometry, esophageal pH monitoring, H. pylori antibody titer or a UGI.  Thyroid Problem  Presents for follow-up  visit. Patient reports no anxiety, cold intolerance, constipation, depressed mood, diaphoresis, diarrhea, dry skin, fatigue, hair loss, heat intolerance, hoarse voice, leg swelling, palpitations, visual change, weight gain or weight loss. The symptoms have been stable. There is no history of heart failure.  Anxiety  Presents for follow-up visit. Patient reports no chest pain, compulsions, confusion, depressed mood, dizziness, excessive worry, feeling of choking, hyperventilation, insomnia, nausea, nervous/anxious behavior, obsessions, palpitations, shortness of breath or suicidal ideas. Symptoms occur most days. The severity of symptoms is mild. The quality of sleep is good.      No problem-specific Assessment & Plan notes found for this encounter.   Past Medical History:  Diagnosis Date  . Arthritis    thumbs, hands  . Depression   . GERD (gastroesophageal reflux disease)   . Headache    occasional - pinched nerve in neck  . Hypertension   . Hypothyroidism   . Thyroid disease   . Wears dentures    full upper, partial lower    Past Surgical History:  Procedure Laterality Date  . CATARACT EXTRACTION Bilateral   . CHOLECYSTECTOMY    . COLONOSCOPY  2013   Dr Gustavo Lah  . HALLUX VALGUS AUSTIN Right 06/03/2015   Procedure: HALLUX VALGUS AUSTIN;  Surgeon: Samara Deist, DPM;  Location: [facility];  Service: Podiatry;  Laterality: Right;  WITH POPLITEAL  . MASTECTOMY Bilateral   . ROTATOR CUFF REPAIR Bilateral   . TONSILLECTOMY    . TOTAL KNEE ARTHROPLASTY Left   . VAGINAL HYSTERECTOMY      History reviewed. No pertinent family history.  Social History   Social History  . Marital status: Widowed    Spouse name: N/A  .  Number of children: N/A  . Years of education: N/A   Occupational History  . Not on file.   Social History Main Topics  . Smoking status: Former Research scientist (life sciences)  . Smokeless tobacco: Not on file     Comment: quit 30+ yrs ago  . Alcohol use No  . Drug  use: No  . Sexual activity: Yes   Other Topics Concern  . Not on file   Social History Narrative  . No narrative on file    Allergies  Allergen Reactions  . Penicillin G Itching     Review of Systems  Constitutional: Negative for chills, diaphoresis, fatigue, fever, malaise/fatigue, weight gain and weight loss.  HENT: Negative for ear discharge, ear pain, hoarse voice and sore throat.   Eyes: Negative for blurred vision.  Respiratory: Negative for cough, sputum production, choking, shortness of breath and wheezing.   Cardiovascular: Negative for chest pain, palpitations, orthopnea, leg swelling and PND.  Gastrointestinal: Negative for abdominal pain, blood in stool, constipation, diarrhea, dysphagia, heartburn, melena and nausea.  Genitourinary: Negative for dysuria, frequency, hematuria and urgency.  Musculoskeletal: Positive for neck pain. Negative for back pain, joint pain and myalgias.  Skin: Negative for rash.  Neurological: Negative for dizziness, tingling, sensory change, focal weakness, weakness and headaches.  Endo/Heme/Allergies: Negative for environmental allergies, cold intolerance, heat intolerance and polydipsia. Does not bruise/bleed easily.  Psychiatric/Behavioral: Negative for confusion, depression and suicidal ideas. The patient is not nervous/anxious and does not have insomnia.      Objective  Vitals:   12/22/15 0903  BP: 124/82  Pulse: 78  Weight: 211 lb (95.7 kg)  Height: 5\' 5"  (1.651 m)    Physical Exam  Constitutional: She is well-developed, well-nourished, and in no distress. No distress.  HENT:  Head: Normocephalic and atraumatic.  Right Ear: External ear normal.  Left Ear: External ear normal.  Nose: Nose normal.  Mouth/Throat: Oropharynx is clear and moist.  Eyes: Conjunctivae and EOM are normal. Pupils are equal, round, and reactive to light. Right eye exhibits no discharge. Left eye exhibits no discharge.  Neck: Normal range of motion.  Neck supple. No JVD present. No thyromegaly present.  Cardiovascular: Normal rate, regular rhythm, normal heart sounds and intact distal pulses.  Exam reveals no gallop and no friction rub.   No murmur heard. Pulmonary/Chest: Effort normal and breath sounds normal. No respiratory distress. She has no wheezes. She has no rales. She exhibits no tenderness.  Abdominal: Soft. Bowel sounds are normal. She exhibits no mass. There is no tenderness. There is no guarding.  Musculoskeletal: Normal range of motion. She exhibits no edema.  Lymphadenopathy:    She has no cervical adenopathy.  Neurological: She is alert.  Skin: Skin is warm and dry. She is not diaphoretic.  Psychiatric: Mood and affect normal.  Nursing note and vitals reviewed.     Assessment & Plan  Problem List Items Addressed This Visit      Cardiovascular and Mediastinum   Essential (primary) hypertension - Primary   Relevant Medications   chlorthalidone (HYGROTON) 25 MG tablet   Other Relevant Orders   Renal Function Panel     Digestive   Gastroduodenal ulcer   Relevant Medications   omeprazole (PRILOSEC) 20 MG capsule   Gastroesophageal reflux disease   Relevant Medications   omeprazole (PRILOSEC) 20 MG capsule     Endocrine   Hypothyroidism   Relevant Medications   levothyroxine (SYNTHROID, LEVOTHROID) 50 MCG tablet   Other Relevant Orders  TSH    Other Visit Diagnoses    Acute anxiety       Relevant Medications   busPIRone (BUSPAR) 5 MG tablet   Chronic anxiety       Relevant Medications   busPIRone (BUSPAR) 5 MG tablet    I spent 25 minutes with this patient, More than 50% of that time was spent in face to face education, counseling and care coordination.   Dr. [facility]  12/22/15

## 2015-12-23 LAB — RENAL FUNCTION PANEL
Albumin: 4.7 g/dL (ref 3.5–4.7)
BUN / CREAT RATIO: 18 (ref 12–28)
BUN: 17 mg/dL (ref 8–27)
CALCIUM: 10.4 mg/dL — AB (ref 8.7–10.3)
CHLORIDE: 96 mmol/L (ref 96–106)
CO2: 25 mmol/L (ref 18–29)
CREATININE: 0.97 mg/dL (ref 0.57–1.00)
GFR calc Af Amer: 64 mL/min/{1.73_m2} (ref >59)
GFR calc non Af Amer: 55 mL/min/{1.73_m2} — ABNORMAL LOW (ref >59)
GLUCOSE: 113 mg/dL — AB (ref 65–99)
PHOSPHORUS: 3.1 mg/dL (ref 2.5–4.5)
POTASSIUM: 3.9 mmol/L (ref 3.5–5.2)
SODIUM: 139 mmol/L (ref 134–144)

## 2015-12-23 LAB — TSH: TSH: 3.84 u[IU]/mL (ref 0.450–4.500)

## 2016-01-14 DIAGNOSIS — R197 Diarrhea, unspecified: Secondary | ICD-10-CM | POA: Diagnosis not present

## 2016-02-12 DIAGNOSIS — L570 Actinic keratosis: Secondary | ICD-10-CM | POA: Diagnosis not present

## 2016-03-03 ENCOUNTER — Other Ambulatory Visit: Payer: Self-pay

## 2016-03-03 DIAGNOSIS — L570 Actinic keratosis: Secondary | ICD-10-CM | POA: Diagnosis not present

## 2016-04-12 DIAGNOSIS — B353 Tinea pedis: Secondary | ICD-10-CM | POA: Diagnosis not present

## 2016-05-03 DIAGNOSIS — B353 Tinea pedis: Secondary | ICD-10-CM | POA: Diagnosis not present

## 2016-05-03 DIAGNOSIS — L923 Foreign body granuloma of the skin and subcutaneous tissue: Secondary | ICD-10-CM | POA: Diagnosis not present

## 2016-06-07 ENCOUNTER — Ambulatory Visit (INDEPENDENT_AMBULATORY_CARE_PROVIDER_SITE_OTHER): Payer: Medicare HMO | Admitting: Family Medicine

## 2016-06-07 VITALS — BP 140/84 | HR 72 | Ht 65.0 in | Wt 212.0 lb

## 2016-06-07 DIAGNOSIS — H8110 Benign paroxysmal vertigo, unspecified ear: Secondary | ICD-10-CM

## 2016-06-07 MED ORDER — AZITHROMYCIN 250 MG PO TABS: ORAL_TABLET | ORAL | 0 refills | Status: DC

## 2016-06-07 MED ORDER — MECLIZINE HCL 25 MG PO TABS: 25.0000 mg | ORAL_TABLET | Freq: Three times a day (TID) | ORAL | 0 refills | Status: DC | PRN

## 2016-06-07 NOTE — Progress Notes (Signed)
Name: Kaitlyn Oliver   MRN: [id number]    DOB: [birth year]   Date:06/07/2016       Progress Note  Subjective  Chief Complaint  Chief Complaint  Patient presents with  . Dizziness    comes and goes/ started Friday- can't walk    Dizziness  This is a new problem. The current episode started in the past 7 days. The problem occurs intermittently. The problem has been gradually worsening. Associated symptoms include nausea and vertigo. Pertinent negatives include no abdominal pain, anorexia, change in bowel habit, chest pain, chills, congestion, coughing, diaphoresis, fever, headaches, joint swelling, myalgias, neck pain, numbness, rash, sore throat, swollen glands, visual change, vomiting or weakness. The symptoms are aggravated by twisting and walking (sudden movement). She has tried nothing for the symptoms.    No problem-specific Assessment & Plan notes found for this encounter.   Past Medical History:  Diagnosis Date  . Arthritis    thumbs, hands  . Depression   . GERD (gastroesophageal reflux disease)   . Headache    occasional - pinched nerve in neck  . Hypertension   . Hypothyroidism   . Thyroid disease   . Wears dentures    full upper, partial lower    Past Surgical History:  Procedure Laterality Date  . CATARACT EXTRACTION Bilateral   . CHOLECYSTECTOMY    . COLONOSCOPY  2013   Dr Marva Panda  . HALLUX VALGUS AUSTIN Right 06/03/2015   Procedure: HALLUX VALGUS AUSTIN;  Surgeon: Gwyneth Revels, DPM;  Location: [facility];  Service: Podiatry;  Laterality: Right;  WITH POPLITEAL  . MASTECTOMY Bilateral   . ROTATOR CUFF REPAIR Bilateral   . TONSILLECTOMY    . TOTAL KNEE ARTHROPLASTY Left   . VAGINAL HYSTERECTOMY      No family history on file.  Social History   Social History  . Marital status: Widowed    Spouse name: N/A  . Number of children: N/A  . Years of education: N/A   Occupational History  . Not on file.   Social History Main Topics  .  Smoking status: Former Games developer  . Smokeless tobacco: Not on file     Comment: quit 30+ yrs ago  . Alcohol use No  . Drug use: No  . Sexual activity: Yes   Other Topics Concern  . Not on file   Social History Narrative  . No narrative on file    Allergies  Allergen Reactions  . Penicillin G Itching    Outpatient Medications Prior to Visit  Medication Sig Dispense Refill  . chlorthalidone (HYGROTON) 25 MG tablet Take 1 tablet (25 mg total) by mouth daily. 90 tablet 1  . omeprazole (PRILOSEC) 20 MG capsule Take 1 capsule (20 mg total) by mouth daily. 30 capsule 11  . triamcinolone (NASACORT ALLERGY 24HR) 55 MCG/ACT AERO nasal inhaler Place 2 sprays into the nose daily. 1 Inhaler 11  . busPIRone (BUSPAR) 5 MG tablet Take 1 tablet (5 mg total) by mouth 2 (two) times daily. (Patient not taking: Reported on 06/07/2016) 180 tablet 1  . celecoxib (CELEBREX) 200 MG capsule Take 1 capsule by mouth as needed. Reported on 06/03/2015    . levothyroxine (SYNTHROID, LEVOTHROID) 50 MCG tablet Take 1 tablet (50 mcg total) by mouth daily. (Patient not taking: Reported on 06/07/2016) 90 tablet 1  . oxyCODONE-acetaminophen (ROXICET) 5-325 MG tablet Take 1-2 tablets by mouth every 4 (four) hours as needed for severe pain. (Patient not taking: Reported on 12/22/2015)  30 tablet 0   No facility-administered medications prior to visit.     Review of Systems  Constitutional: Negative for chills, diaphoresis, fever and weight loss.  HENT: Negative for congestion, ear discharge, ear pain, hearing loss, nosebleeds, sinus pain, sore throat and tinnitus.   Eyes: Negative for blurred vision and double vision.  Respiratory: Negative for cough, sputum production, shortness of breath and wheezing.   Cardiovascular: Negative for chest pain, palpitations and leg swelling.  Gastrointestinal: Positive for nausea. Negative for abdominal pain, anorexia, blood in stool, change in bowel habit, constipation, diarrhea,  heartburn, melena and vomiting.  Genitourinary: Negative for dysuria, frequency, hematuria and urgency.  Musculoskeletal: Negative for back pain, joint pain, joint swelling, myalgias and neck pain.  Skin: Negative for rash.  Neurological: Positive for dizziness and vertigo. Negative for tingling, sensory change, focal weakness, weakness, numbness and headaches.  Endo/Heme/Allergies: Negative for environmental allergies and polydipsia. Does not bruise/bleed easily.  Psychiatric/Behavioral: Negative for depression and suicidal ideas. The patient is not nervous/anxious and does not have insomnia.      Objective  Vitals:   06/07/16 1336  BP: 140/84  Pulse: 72  Weight: 212 lb (96.2 kg)  Height: 5\' 5"  (1.651 m)    Physical Exam  Constitutional: She is well-developed, well-nourished, and in no distress. No distress.  HENT:  Head: Normocephalic and atraumatic.  Right Ear: External ear normal.  Left Ear: External ear normal.  Nose: Nose normal.  Mouth/Throat: Oropharynx is clear and moist.  Eyes: Conjunctivae and EOM are normal. Pupils are equal, round, and reactive to light. Right eye exhibits no discharge. Left eye exhibits no discharge.  Neck: Normal range of motion. Neck supple. No JVD present. No thyromegaly present.  Cardiovascular: Normal rate, regular rhythm, normal heart sounds and intact distal pulses.  Exam reveals no gallop and no friction rub.   No murmur heard. Pulmonary/Chest: Effort normal and breath sounds normal.  Abdominal: Soft. Bowel sounds are normal. She exhibits no mass. There is no tenderness. There is no guarding.  Musculoskeletal: Normal range of motion. She exhibits no edema.  Lymphadenopathy:    She has no cervical adenopathy.  Neurological: She is alert. She has normal sensation, normal strength, normal reflexes and intact cranial nerves.  "lopsided " smile/hx of Bell's  Skin: Skin is warm and dry. She is not diaphoretic.  Psychiatric: Mood and affect  normal.  Nursing note and vitals reviewed.     Assessment & Plan  Problem List Items Addressed This Visit    None    Visit Diagnoses    Benign paroxysmal positional vertigo, unspecified laterality    -  Primary   flonase   Relevant Medications   azithromycin (ZITHROMAX) 250 MG tablet   meclizine (ANTIVERT) 25 MG tablet   Other Relevant Orders   Ambulatory referral to ENT      Meds ordered this encounter  Medications  . azithromycin (ZITHROMAX) 250 MG tablet    Sig: 2 today then 1 a day for 4 days    Dispense:  6 tablet    Refill:  0  . meclizine (ANTIVERT) 25 MG tablet    Sig: Take 1 tablet (25 mg total) by mouth 3 (three) times daily as needed for dizziness.    Dispense:  30 tablet    Refill:  0      Dr. Hay[facility]  06/07/16

## 2016-06-07 NOTE — Patient Instructions (Signed)

## 2016-06-20 DIAGNOSIS — R42 Dizziness and giddiness: Secondary | ICD-10-CM | POA: Diagnosis not present

## 2016-08-04 ENCOUNTER — Encounter: Payer: Self-pay | Admitting: Family Medicine

## 2016-08-04 ENCOUNTER — Ambulatory Visit (INDEPENDENT_AMBULATORY_CARE_PROVIDER_SITE_OTHER): Payer: Medicare HMO | Admitting: Family Medicine

## 2016-08-04 VITALS — BP 120/64 | HR 60 | Temp 97.6°F | Ht 65.0 in | Wt 213.0 lb

## 2016-08-04 DIAGNOSIS — I10 Essential (primary) hypertension: Secondary | ICD-10-CM | POA: Diagnosis not present

## 2016-08-04 DIAGNOSIS — J029 Acute pharyngitis, unspecified: Secondary | ICD-10-CM | POA: Diagnosis not present

## 2016-08-04 DIAGNOSIS — J0100 Acute maxillary sinusitis, unspecified: Secondary | ICD-10-CM

## 2016-08-04 DIAGNOSIS — J40 Bronchitis, not specified as acute or chronic: Secondary | ICD-10-CM

## 2016-08-04 MED ORDER — DOXYCYCLINE HYCLATE 100 MG PO TABS: 100.0000 mg | ORAL_TABLET | Freq: Two times a day (BID) | ORAL | 0 refills | Status: DC

## 2016-08-04 MED ORDER — CHLORTHALIDONE 25 MG PO TABS: 25.0000 mg | ORAL_TABLET | Freq: Every day | ORAL | 1 refills | Status: DC

## 2016-08-04 MED ORDER — GUAIFENESIN-CODEINE 100-10 MG/5ML PO SYRP: 5.0000 mL | ORAL_SOLUTION | Freq: Three times a day (TID) | ORAL | 0 refills | Status: DC | PRN

## 2016-08-04 NOTE — Progress Notes (Signed)
Name: Kaitlyn Oliver   MRN: [id number]    DOB: 1934/02/18   Date:08/04/2016       Progress Note  Subjective  Chief Complaint  Chief Complaint  Patient presents with  . Sore Throat    runny nose- clear production. throat is raw    Sore Throat   This is a new problem. The current episode started in the past 7 days. The problem has been gradually worsening. There has been no fever. The pain is mild. Pertinent negatives include no abdominal pain, congestion, coughing, diarrhea, drooling, ear discharge, ear pain, headaches, hoarse voice, plugged ear sensation, neck pain, shortness of breath, stridor, swollen glands, trouble swallowing or vomiting. She has tried nothing for the symptoms.  Sinusitis  This is a chronic problem. The problem has been gradually worsening since onset. There has been no fever. Pertinent negatives include no chills, congestion, coughing, diaphoresis, ear pain, headaches, hoarse voice, neck pain, shortness of breath, sinus pressure, sneezing, sore throat or swollen glands. Past treatments include oral decongestants. The treatment provided mild relief.  Cough  This is a new problem. The current episode started in the past 7 days. The problem has been gradually worsening. The cough is productive of sputum. Pertinent negatives include no chest pain, chills, ear pain, fever, headaches, heartburn, myalgias, postnasal drip, rash, sore throat, shortness of breath, sweats, weight loss or wheezing. The symptoms are aggravated by pollens. There is no history of environmental allergies.  Hypertension  This is a chronic problem. The current episode started more than 1 year ago. The problem is unchanged. Pertinent negatives include no anxiety, blurred vision, chest pain, headaches, malaise/fatigue, neck pain, orthopnea, palpitations, peripheral edema, PND, shortness of breath or sweats. There are no associated agents to hypertension. There are no known risk factors for coronary artery  disease. Past treatments include diuretics. The current treatment provides mild improvement.    No problem-specific Assessment & Plan notes found for this encounter.   Past Medical History:  Diagnosis Date  . Arthritis    thumbs, hands  . Depression   . GERD (gastroesophageal reflux disease)   . Headache    occasional - pinched nerve in neck  . Hypertension   . Hypothyroidism   . Thyroid disease   . Wears dentures    full upper, partial lower    Past Surgical History:  Procedure Laterality Date  . CATARACT EXTRACTION Bilateral   . CHOLECYSTECTOMY    . COLONOSCOPY  2013   Dr Gustavo Lah  . HALLUX VALGUS AUSTIN Right 06/03/2015   Procedure: HALLUX VALGUS AUSTIN;  Surgeon: Samara Deist, DPM;  Location: [facility];  Service: Podiatry;  Laterality: Right;  WITH POPLITEAL  . MASTECTOMY Bilateral   . ROTATOR CUFF REPAIR Bilateral   . TONSILLECTOMY    . TOTAL KNEE ARTHROPLASTY Left   . VAGINAL HYSTERECTOMY      No family history on file.  Social History   Social History  . Marital status: Widowed    Spouse name: N/A  . Number of children: N/A  . Years of education: N/A   Occupational History  . Not on file.   Social History Main Topics  . Smoking status: Former Research scientist (life sciences)  . Smokeless tobacco: Never Used     Comment: quit 30+ yrs ago  . Alcohol use No  . Drug use: No  . Sexual activity: Yes   Other Topics Concern  . Not on file   Social History Narrative  . No narrative on file  Allergies  Allergen Reactions  . Penicillin G Itching    Outpatient Medications Prior to Visit  Medication Sig Dispense Refill  . busPIRone (BUSPAR) 5 MG tablet Take 1 tablet (5 mg total) by mouth 2 (two) times daily. 180 tablet 1  . celecoxib (CELEBREX) 200 MG capsule Take 1 capsule by mouth as needed. Reported on 06/03/2015    . meclizine (ANTIVERT) 25 MG tablet Take 1 tablet (25 mg total) by mouth 3 (three) times daily as needed for dizziness. 30 tablet 0  . omeprazole  (PRILOSEC) 20 MG capsule Take 1 capsule (20 mg total) by mouth daily. 30 capsule 11  . triamcinolone (NASACORT ALLERGY 24HR) 55 MCG/ACT AERO nasal inhaler Place 2 sprays into the nose daily. 1 Inhaler 11  . chlorthalidone (HYGROTON) 25 MG tablet Take 1 tablet (25 mg total) by mouth daily. 90 tablet 1  . azithromycin (ZITHROMAX) 250 MG tablet 2 today then 1 a day for 4 days 6 tablet 0  . levothyroxine (SYNTHROID, LEVOTHROID) 50 MCG tablet Take 1 tablet (50 mcg total) by mouth daily. (Patient not taking: Reported on 06/07/2016) 90 tablet 1   No facility-administered medications prior to visit.     Review of Systems  Constitutional: Negative for chills, diaphoresis, fever, malaise/fatigue and weight loss.  HENT: Negative for congestion, drooling, ear discharge, ear pain, hoarse voice, postnasal drip, sinus pressure, sneezing, sore throat and trouble swallowing.   Eyes: Negative for blurred vision.  Respiratory: Negative for cough, sputum production, shortness of breath, wheezing and stridor.   Cardiovascular: Negative for chest pain, palpitations, orthopnea, leg swelling and PND.  Gastrointestinal: Negative for abdominal pain, blood in stool, constipation, diarrhea, heartburn, melena, nausea and vomiting.  Genitourinary: Negative for dysuria, frequency, hematuria and urgency.  Musculoskeletal: Negative for back pain, joint pain, myalgias and neck pain.  Skin: Negative for rash.  Neurological: Negative for dizziness, tingling, sensory change, focal weakness and headaches.  Endo/Heme/Allergies: Negative for environmental allergies and polydipsia. Does not bruise/bleed easily.  Psychiatric/Behavioral: Negative for depression and suicidal ideas. The patient is not nervous/anxious and does not have insomnia.      Objective  Vitals:   08/04/16 1118  BP: 120/64  Pulse: 60  Temp: 97.6 F (36.4 C)  TempSrc: Oral  Weight: 213 lb (96.6 kg)  Height: 5\' 5"  (1.651 m)    Physical Exam   Constitutional: She is well-developed, well-nourished, and in no distress. No distress.  HENT:  Head: Normocephalic and atraumatic.  Right Ear: External ear normal.  Left Ear: External ear normal.  Nose: Nose normal.  Mouth/Throat: Oropharynx is clear and moist.  Eyes: Conjunctivae and EOM are normal. Pupils are equal, round, and reactive to light. Right eye exhibits no discharge. Left eye exhibits no discharge.  Neck: Normal range of motion. Neck supple. No JVD present. No thyromegaly present.  Cardiovascular: Normal rate, regular rhythm, normal heart sounds and intact distal pulses.  Exam reveals no gallop and no friction rub.   No murmur heard. Pulmonary/Chest: Effort normal and breath sounds normal. She has no wheezes. She has no rales.  Abdominal: Soft. Bowel sounds are normal. She exhibits no mass. There is no tenderness. There is no guarding.  Musculoskeletal: Normal range of motion. She exhibits no edema.  Lymphadenopathy:    She has no cervical adenopathy.  Neurological: She is alert. She has normal reflexes.  Skin: Skin is warm and dry. She is not diaphoretic.  Psychiatric: Mood and affect normal.  Nursing note and vitals reviewed.  Assessment & Plan  Problem List Items Addressed This Visit    None    Visit Diagnoses    Acute maxillary sinusitis, recurrence not specified    -  Primary   Relevant Medications   doxycycline (VIBRA-TABS) 100 MG tablet   guaiFENesin-codeine (ROBITUSSIN AC) 100-10 MG/5ML syrup   Bronchitis       Relevant Medications   doxycycline (VIBRA-TABS) 100 MG tablet   guaiFENesin-codeine (ROBITUSSIN AC) 100-10 MG/5ML syrup   Pharyngitis, unspecified etiology       Relevant Medications   doxycycline (VIBRA-TABS) 100 MG tablet   Essential hypertension       Relevant Medications   chlorthalidone (HYGROTON) 25 MG tablet      Meds ordered this encounter  Medications  . chlorthalidone (HYGROTON) 25 MG tablet    Sig: Take 1 tablet (25 mg  total) by mouth daily.    Dispense:  90 tablet    Refill:  1  . doxycycline (VIBRA-TABS) 100 MG tablet    Sig: Take 1 tablet (100 mg total) by mouth 2 (two) times daily.    Dispense:  20 tablet    Refill:  0  . guaiFENesin-codeine (ROBITUSSIN AC) 100-10 MG/5ML syrup    Sig: Take 5 mLs by mouth 3 (three) times daily as needed for cough.    Dispense:  150 mL    Refill:  0      Dr. Otilio Miu [facility]  08/04/16

## 2016-08-17 DIAGNOSIS — M25561 Pain in right knee: Secondary | ICD-10-CM | POA: Diagnosis not present

## 2016-08-17 DIAGNOSIS — M1711 Unilateral primary osteoarthritis, right knee: Secondary | ICD-10-CM | POA: Diagnosis not present

## 2016-08-26 ENCOUNTER — Other Ambulatory Visit: Payer: Self-pay | Admitting: Orthopedic Surgery

## 2016-08-26 ENCOUNTER — Ambulatory Visit
Admission: RE | Admit: 2016-08-26 | Discharge: 2016-08-26 | Disposition: A | Discharge status: Home / Self Care | Payer: Medicare HMO | Source: Ambulatory Visit | Attending: Orthopedic Surgery | Admitting: Orthopedic Surgery

## 2016-08-26 DIAGNOSIS — G8929 Other chronic pain: Secondary | ICD-10-CM

## 2016-08-26 DIAGNOSIS — S83241A Other tear of medial meniscus, current injury, right knee, initial encounter: Secondary | ICD-10-CM | POA: Diagnosis not present

## 2016-08-26 DIAGNOSIS — M1711 Unilateral primary osteoarthritis, right knee: Secondary | ICD-10-CM | POA: Diagnosis not present

## 2016-08-26 DIAGNOSIS — M25561 Pain in right knee: Secondary | ICD-10-CM | POA: Insufficient documentation

## 2016-08-26 DIAGNOSIS — M84461A Pathological fracture, right tibia, initial encounter for fracture: Secondary | ICD-10-CM | POA: Insufficient documentation

## 2016-08-26 DIAGNOSIS — S8391XA Sprain of unspecified site of right knee, initial encounter: Secondary | ICD-10-CM | POA: Insufficient documentation

## 2016-08-26 DIAGNOSIS — X58XXXA Exposure to other specified factors, initial encounter: Secondary | ICD-10-CM | POA: Insufficient documentation

## 2016-08-26 DIAGNOSIS — M84451A Pathological fracture, right femur, initial encounter for fracture: Secondary | ICD-10-CM | POA: Insufficient documentation

## 2016-08-30 ENCOUNTER — Ambulatory Visit: Payer: Medicare HMO | Admitting: Anesthesiology

## 2016-08-30 ENCOUNTER — Encounter: Payer: Self-pay | Admitting: *Deleted

## 2016-08-30 ENCOUNTER — Ambulatory Visit: Payer: Medicare HMO

## 2016-08-30 ENCOUNTER — Encounter
Admission: RE | Disposition: A | Discharge status: Home / Self Care | Payer: Self-pay | Source: Ambulatory Visit | Attending: Orthopedic Surgery

## 2016-08-30 ENCOUNTER — Ambulatory Visit
Admission: RE | Admit: 2016-08-30 | Discharge: 2016-08-30 | Disposition: A | Discharge status: Home / Self Care | Payer: Medicare HMO | Source: Ambulatory Visit | Attending: Orthopedic Surgery | Admitting: Orthopedic Surgery

## 2016-08-30 DIAGNOSIS — M23303 Other meniscus derangements, unspecified medial meniscus, right knee: Secondary | ICD-10-CM | POA: Diagnosis not present

## 2016-08-30 DIAGNOSIS — R69 Illness, unspecified: Secondary | ICD-10-CM | POA: Diagnosis not present

## 2016-08-30 DIAGNOSIS — K219 Gastro-esophageal reflux disease without esophagitis: Secondary | ICD-10-CM | POA: Diagnosis not present

## 2016-08-30 DIAGNOSIS — Z7951 Long term (current) use of inhaled steroids: Secondary | ICD-10-CM | POA: Diagnosis not present

## 2016-08-30 DIAGNOSIS — M25561 Pain in right knee: Secondary | ICD-10-CM | POA: Diagnosis not present

## 2016-08-30 DIAGNOSIS — M2241 Chondromalacia patellae, right knee: Secondary | ICD-10-CM | POA: Diagnosis not present

## 2016-08-30 DIAGNOSIS — J302 Other seasonal allergic rhinitis: Secondary | ICD-10-CM | POA: Insufficient documentation

## 2016-08-30 DIAGNOSIS — Z79899 Other long term (current) drug therapy: Secondary | ICD-10-CM | POA: Diagnosis not present

## 2016-08-30 DIAGNOSIS — M1711 Unilateral primary osteoarthritis, right knee: Secondary | ICD-10-CM | POA: Insufficient documentation

## 2016-08-30 DIAGNOSIS — I10 Essential (primary) hypertension: Secondary | ICD-10-CM | POA: Insufficient documentation

## 2016-08-30 DIAGNOSIS — E039 Hypothyroidism, unspecified: Secondary | ICD-10-CM | POA: Insufficient documentation

## 2016-08-30 DIAGNOSIS — Z791 Long term (current) use of non-steroidal anti-inflammatories (NSAID): Secondary | ICD-10-CM | POA: Insufficient documentation

## 2016-08-30 DIAGNOSIS — M23221 Derangement of posterior horn of medial meniscus due to old tear or injury, right knee: Secondary | ICD-10-CM | POA: Insufficient documentation

## 2016-08-30 DIAGNOSIS — S83241A Other tear of medial meniscus, current injury, right knee, initial encounter: Secondary | ICD-10-CM | POA: Diagnosis not present

## 2016-08-30 DIAGNOSIS — Z87891 Personal history of nicotine dependence: Secondary | ICD-10-CM | POA: Diagnosis not present

## 2016-08-30 DIAGNOSIS — Z419 Encounter for procedure for purposes other than remedying health state, unspecified: Secondary | ICD-10-CM

## 2016-08-30 DIAGNOSIS — F329 Major depressive disorder, single episode, unspecified: Secondary | ICD-10-CM | POA: Diagnosis not present

## 2016-08-30 DIAGNOSIS — M23300 Other meniscus derangements, unspecified lateral meniscus, right knee: Secondary | ICD-10-CM | POA: Diagnosis not present

## 2016-08-30 DIAGNOSIS — Z9013 Acquired absence of bilateral breasts and nipples: Secondary | ICD-10-CM | POA: Insufficient documentation

## 2016-08-30 DIAGNOSIS — M23261 Derangement of other lateral meniscus due to old tear or injury, right knee: Secondary | ICD-10-CM | POA: Diagnosis not present

## 2016-08-30 DIAGNOSIS — S83282A Other tear of lateral meniscus, current injury, left knee, initial encounter: Secondary | ICD-10-CM | POA: Diagnosis not present

## 2016-08-30 DIAGNOSIS — S83281A Other tear of lateral meniscus, current injury, right knee, initial encounter: Secondary | ICD-10-CM | POA: Diagnosis not present

## 2016-08-30 HISTORY — PX: KNEE ARTHROSCOPY WITH MEDIAL MENISECTOMY: SHX5651

## 2016-08-30 HISTORY — PX: KNEE ARTHROSCOPY WITH SUBCHONDROPLASTY: SHX6732

## 2016-08-30 SURGERY — ARTHROSCOPY, KNEE, WITH MEDIAL MENISCECTOMY
Anesthesia: General | Site: Knee | Laterality: Right | Wound class: Clean

## 2016-08-30 MED ORDER — DEXAMETHASONE SODIUM PHOSPHATE 10 MG/ML IJ SOLN
INTRAMUSCULAR | Status: DC | PRN
  Administered 2016-08-30: 8 mg via INTRAVENOUS

## 2016-08-30 MED ORDER — FENTANYL CITRATE (PF) 100 MCG/2ML IJ SOLN
INTRAMUSCULAR | Status: AC
  Filled 2016-08-30: qty 2

## 2016-08-30 MED ORDER — FAMOTIDINE 20 MG PO TABS
ORAL_TABLET | ORAL | Status: AC
  Filled 2016-08-30: qty 1

## 2016-08-30 MED ORDER — FAMOTIDINE 20 MG PO TABS: 20.0000 mg | ORAL_TABLET | Freq: Once | ORAL | Status: DC

## 2016-08-30 MED ORDER — CLINDAMYCIN PHOSPHATE 900 MG/50ML IV SOLN
900.0000 mg | Freq: Once | INTRAVENOUS | Status: AC
  Administered 2016-08-30: 900 mg via INTRAVENOUS

## 2016-08-30 MED ORDER — PROPOFOL 10 MG/ML IV BOLUS
INTRAVENOUS | Status: AC
  Filled 2016-08-30: qty 20

## 2016-08-30 MED ORDER — LACTATED RINGERS IV SOLN
INTRAVENOUS | Status: DC
  Administered 2016-08-30 (×2): via INTRAVENOUS

## 2016-08-30 MED ORDER — HYDROCODONE-ACETAMINOPHEN 5-325 MG PO TABS
1.0000 | ORAL_TABLET | ORAL | Status: DC | PRN
  Administered 2016-08-30: 1 via ORAL

## 2016-08-30 MED ORDER — ACETAMINOPHEN 10 MG/ML IV SOLN
INTRAVENOUS | Status: AC
  Filled 2016-08-30: qty 100

## 2016-08-30 MED ORDER — MIDAZOLAM HCL 2 MG/2ML IJ SOLN
INTRAMUSCULAR | Status: DC | PRN
  Administered 2016-08-30: 1 mg via INTRAVENOUS

## 2016-08-30 MED ORDER — ONDANSETRON HCL 4 MG PO TABS: 4.0000 mg | ORAL_TABLET | Freq: Four times a day (QID) | ORAL | Status: DC | PRN

## 2016-08-30 MED ORDER — SUGAMMADEX SODIUM 200 MG/2ML IV SOLN
INTRAVENOUS | Status: AC
  Filled 2016-08-30: qty 2

## 2016-08-30 MED ORDER — ROCURONIUM BROMIDE 50 MG/5ML IV SOLN
INTRAVENOUS | Status: AC
  Filled 2016-08-30: qty 1

## 2016-08-30 MED ORDER — METOCLOPRAMIDE HCL 5 MG/ML IJ SOLN: 5.0000 mg | Freq: Three times a day (TID) | INTRAMUSCULAR | Status: DC | PRN

## 2016-08-30 MED ORDER — DEXAMETHASONE SODIUM PHOSPHATE 10 MG/ML IJ SOLN
INTRAMUSCULAR | Status: AC
  Filled 2016-08-30: qty 1

## 2016-08-30 MED ORDER — FENTANYL CITRATE (PF) 100 MCG/2ML IJ SOLN
INTRAMUSCULAR | Status: DC | PRN
  Administered 2016-08-30 (×2): 50 ug via INTRAVENOUS

## 2016-08-30 MED ORDER — METOCLOPRAMIDE HCL 10 MG PO TABS: 5.0000 mg | ORAL_TABLET | Freq: Three times a day (TID) | ORAL | Status: DC | PRN

## 2016-08-30 MED ORDER — HYDRALAZINE HCL 20 MG/ML IJ SOLN
10.0000 mg | Freq: Once | INTRAMUSCULAR | Status: AC
  Administered 2016-08-30: 10 mg via INTRAVENOUS

## 2016-08-30 MED ORDER — MIDAZOLAM HCL 2 MG/2ML IJ SOLN
INTRAMUSCULAR | Status: AC
  Filled 2016-08-30: qty 2

## 2016-08-30 MED ORDER — EPHEDRINE SULFATE 50 MG/ML IJ SOLN
INTRAMUSCULAR | Status: DC | PRN
  Administered 2016-08-30: 5 mg via INTRAVENOUS

## 2016-08-30 MED ORDER — HYDRALAZINE HCL 20 MG/ML IJ SOLN
INTRAMUSCULAR | Status: AC
  Filled 2016-08-30: qty 1

## 2016-08-30 MED ORDER — BUPIVACAINE-EPINEPHRINE (PF) 0.5% -1:200000 IJ SOLN
INTRAMUSCULAR | Status: AC
  Filled 2016-08-30: qty 30

## 2016-08-30 MED ORDER — ONDANSETRON HCL 4 MG/2ML IJ SOLN
INTRAMUSCULAR | Status: AC
  Filled 2016-08-30: qty 2

## 2016-08-30 MED ORDER — PROPOFOL 10 MG/ML IV BOLUS
INTRAVENOUS | Status: DC | PRN
  Administered 2016-08-30: 30 mg via INTRAVENOUS
  Administered 2016-08-30: 100 mg via INTRAVENOUS

## 2016-08-30 MED ORDER — CLINDAMYCIN PHOSPHATE 900 MG/50ML IV SOLN
INTRAVENOUS | Status: AC
  Filled 2016-08-30: qty 50

## 2016-08-30 MED ORDER — ONDANSETRON HCL 4 MG/2ML IJ SOLN: 4.0000 mg | Freq: Once | INTRAMUSCULAR | Status: DC | PRN

## 2016-08-30 MED ORDER — FENTANYL CITRATE (PF) 100 MCG/2ML IJ SOLN
25.0000 ug | INTRAMUSCULAR | Status: DC | PRN
  Administered 2016-08-30 (×3): 25 ug via INTRAVENOUS

## 2016-08-30 MED ORDER — ACETAMINOPHEN 10 MG/ML IV SOLN
INTRAVENOUS | Status: DC | PRN
  Administered 2016-08-30: 1000 mg via INTRAVENOUS

## 2016-08-30 MED ORDER — SUGAMMADEX SODIUM 200 MG/2ML IV SOLN
INTRAVENOUS | Status: DC | PRN
  Administered 2016-08-30: 195 mg via INTRAVENOUS

## 2016-08-30 MED ORDER — BUPIVACAINE-EPINEPHRINE (PF) 0.5% -1:200000 IJ SOLN
INTRAMUSCULAR | Status: DC | PRN
  Administered 2016-08-30: 30 mL via PERINEURAL

## 2016-08-30 MED ORDER — ROCURONIUM BROMIDE 100 MG/10ML IV SOLN
INTRAVENOUS | Status: DC | PRN
  Administered 2016-08-30: 10 mg via INTRAVENOUS
  Administered 2016-08-30: 30 mg via INTRAVENOUS

## 2016-08-30 MED ORDER — SODIUM CHLORIDE 0.9 % IV SOLN: INTRAVENOUS | Status: DC

## 2016-08-30 MED ORDER — HYDROCODONE-ACETAMINOPHEN 5-325 MG PO TABS
ORAL_TABLET | ORAL | Status: AC
  Filled 2016-08-30: qty 1

## 2016-08-30 MED ORDER — HYDROCODONE-ACETAMINOPHEN 5-325 MG PO TABS: 1.0000 | ORAL_TABLET | Freq: Four times a day (QID) | ORAL | 0 refills | Status: DC | PRN

## 2016-08-30 MED ORDER — ONDANSETRON HCL 4 MG/2ML IJ SOLN: 4.0000 mg | Freq: Four times a day (QID) | INTRAMUSCULAR | Status: DC | PRN

## 2016-08-30 MED ORDER — ONDANSETRON HCL 4 MG/2ML IJ SOLN
INTRAMUSCULAR | Status: DC | PRN
  Administered 2016-08-30: 4 mg via INTRAVENOUS

## 2016-08-30 MED ORDER — LIDOCAINE HCL (CARDIAC) 20 MG/ML IV SOLN
INTRAVENOUS | Status: DC | PRN
  Administered 2016-08-30: 40 mg via INTRAVENOUS

## 2016-08-30 SURGICAL SUPPLY — 30 items
BANDAGE ACE 4X5 VEL STRL LF (GAUZE/BANDAGES/DRESSINGS) ×2 IMPLANT
BLADE INCISOR PLUS 4.5 (BLADE) ×2 IMPLANT
CHLORAPREP W/TINT 26ML (MISCELLANEOUS) ×2 IMPLANT
CUFF TOURN 24 STER (MISCELLANEOUS) IMPLANT
CUFF TOURN 30 STER DUAL PORT (MISCELLANEOUS) ×1 IMPLANT
DRAPE C-ARM XRAY 36X54 (DRAPES) ×2 IMPLANT
DRAPE C-ARMOR (DRAPES) ×2 IMPLANT
GAUZE PETRO XEROFOAM 1X8 (MISCELLANEOUS) ×1 IMPLANT
GAUZE SPONGE 4X4 12PLY STRL (GAUZE/BANDAGES/DRESSINGS) ×2 IMPLANT
GLOVE SURG SYN 9.0  PF PI (GLOVE) ×1
GLOVE SURG SYN 9.0 PF PI (GLOVE) ×1 IMPLANT
GOWN SRG 2XL LVL 4 RGLN SLV (GOWNS) ×1 IMPLANT
GOWN STRL NON-REIN 2XL LVL4 (GOWNS) ×2
GOWN STRL REUS W/ TWL LRG LVL3 (GOWN DISPOSABLE) ×2 IMPLANT
GOWN STRL REUS W/TWL LRG LVL3 (GOWN DISPOSABLE) ×6
GRAFT FILLER BONE 5ML (Knees) IMPLANT
IV LACTATED RINGER IRRG 3000ML (IV SOLUTION) ×6
IV LR IRRIG 3000ML ARTHROMATIC (IV SOLUTION) ×4 IMPLANT
KIT ACCUFILL 5CC (Knees) ×1 IMPLANT
KIT KNEE SCP 414.502 (Knees) ×2 IMPLANT
KIT RM TURNOVER STRD PROC AR (KITS) ×2 IMPLANT
MANIFOLD NEPTUNE II (INSTRUMENTS) ×2 IMPLANT
PACK ARTHROSCOPY KNEE (MISCELLANEOUS) ×2 IMPLANT
SET TUBE SUCT SHAVER OUTFL 24K (TUBING) ×2 IMPLANT
SET TUBE TIP INTRA-ARTICULAR (MISCELLANEOUS) ×2 IMPLANT
SUT ETHILON 4-0 (SUTURE) ×2
SUT ETHILON 4-0 FS2 18XMFL BLK (SUTURE) ×1
SUTURE ETHLN 4-0 FS2 18XMF BLK (SUTURE) ×1 IMPLANT
TUBING ARTHRO INFLOW-ONLY STRL (TUBING) ×2 IMPLANT
WAND COBLATION FLOW 50 (SURGICAL WAND) ×2 IMPLANT

## 2016-08-30 NOTE — Anesthesia Preprocedure Evaluation (Signed)
Anesthesia Evaluation  Patient identified by MRN, date of birth, ID band Patient awake    Reviewed: Allergy & Precautions, NPO status , Patient's Chart, lab work & pertinent test results  Airway Mallampati: II  TM Distance: >3 FB Neck ROM: Full    Dental  (+) Upper Dentures, Lower Dentures   Pulmonary former smoker,    Pulmonary exam normal        Cardiovascular hypertension, Pt. on medications Normal cardiovascular exam+ Valvular Problems/Murmurs      Neuro/Psych  Headaches, PSYCHIATRIC DISORDERS Depression    GI/Hepatic Neg liver ROS, PUD, GERD  Medicated,  Endo/Other  Hypothyroidism   Renal/GU negative Renal ROS  negative genitourinary   Musculoskeletal  (+) Arthritis , Osteoarthritis,    Abdominal   Peds negative pediatric ROS (+)  Hematology negative hematology ROS (+)   Anesthesia Other Findings   Reproductive/Obstetrics                             Anesthesia Physical  Anesthesia Plan  ASA: II  Anesthesia Plan: General   Post-op Pain Management:    Induction: Intravenous  PONV Risk Score and Plan: 3 and Ondansetron, Dexamethasone, Propofol, Midazolam and Treatment may vary due to age or medical condition  Airway Management Planned: Oral ETT  Additional Equipment:   Intra-op Plan:   Post-operative Plan: Extubation in OR  Informed Consent: I have reviewed the patients History and Physical, chart, labs and discussed the procedure including the risks, benefits and alternatives for the proposed anesthesia with the patient or authorized representative who has indicated his/her understanding and acceptance.   Dental advisory given  Plan Discussed with: CRNA and Surgeon  Anesthesia Plan Comments:         Anesthesia Quick Evaluation

## 2016-08-30 NOTE — Anesthesia Procedure Notes (Signed)
Procedure Name: Intubation Date/Time: 08/30/2016 12:40 PM Performed by: Allean Found Pre-anesthesia Checklist: Patient identified, Emergency Drugs available, Suction available, Patient being monitored and Timeout performed Patient Re-evaluated:Patient Re-evaluated prior to inductionOxygen Delivery Method: Circle system utilized Preoxygenation: Pre-oxygenation with 100% oxygen Intubation Type: IV induction Laryngoscope Size: Mac and 3 Grade View: Grade I Tube type: Oral Tube size: 7.0 mm Number of attempts: 1 Airway Equipment and Method: Stylet Placement Confirmation: ETT inserted through vocal cords under direct vision,  positive ETCO2 and breath sounds checked- equal and bilateral Secured at: 21 cm Tube secured with: Tape Dental Injury: Teeth and Oropharynx as per pre-operative assessment

## 2016-08-30 NOTE — Transfer of Care (Signed)
Immediate Anesthesia Transfer of Care Note  Patient: Kaitlyn Oliver  Procedure(s) Performed: Procedure(s): KNEE ARTHROSCOPY WITH PARTIAL MEDIAL MENISECTOMY and Partial Lateral Menisectomy (Right) KNEE ARTHROSCOPY WITH SUBCHONDROPLASTY (Right)  Patient Location: PACU  Anesthesia Type:General  Level of Consciousness: awake  Airway & Oxygen Therapy: Patient Spontanous Breathing and Patient connected to face mask oxygen  Post-op Assessment: Report given to RN and Post -op Vital signs reviewed and stable  Post vital signs: Reviewed and stable  Last Vitals:  Vitals:   08/30/16 1031  BP: (!) 122/103  Pulse: 70  Resp: 18  Temp: 36.7 C    Last Pain:  Vitals:   08/30/16 1031  TempSrc: Oral  PainSc: 8          Complications: No apparent anesthesia complications

## 2016-08-30 NOTE — Op Note (Signed)
08/30/2016  1:44 PM  PATIENT:  Kaitlyn Oliver  83 y.o. female  PRE-OPERATIVE DIAGNOSIS:  ACUTE PAIN OF RIGHT KNEE, degenerative arthritis medial meniscus tear and subchondral insufficiency fracture right medial tibia  POST-OPERATIVE DIAGNOSIS:  ACUTE PAIN OF RIGHT KNEE same with lateral meniscus tear  PROCEDURE:  Procedure(s): KNEE ARTHROSCOPY WITH PARTIAL MEDIAL MENISECTOMY and Partial Lateral Menisectomy (Right) KNEE ARTHROSCOPY WITH SUBCHONDROPLASTY (Right)  SURGEON: Laurene Footman, MD  ASSISTANTS: None  ANESTHESIA:   general  EBL:  Total I/O In: 700 [I.V.:700] Out: 5 [Blood:5]  BLOOD ADMINISTERED:none  DRAINS: none   LOCAL MEDICATIONS USED:  MARCAINE     SPECIMEN:  No Specimen  DISPOSITION OF SPECIMEN:  N/A  COUNTS:  YES  TOURNIQUET:   20 minutes at 300 mmHg  IMPLANTS: Subchondral paste  DICTATION: .Dragon Dictation patient was brought the operating room and after adequate general anesthesia was obtained, the right leg is prepped draped in sterile fashion. After patient identification and timeout procedures were completed, an inferior lateral portal was made and the arthroscope was introduced. In the patellofemoral joint was remarkable for significant degenerative changes to the articular surface of the patella with some fissuring and fibrillation of the trochlea. Coming around medially and laterally and checking the gutters there were no loose bodies. Corral medial compartment inferior medial portal was made and on probing consistent with MRI findings there is a complex tear of the posterior third and portion of the middle third of the medial meniscus. Is also extensive degenerative change on both femoral and tibial sides femur more involved but without exposed bone. The meniscal Punch was used followed by wand to ablate tissue back to a stable margin. The anterior cruciate ligament was intact and going to the lateral compartment there was a tear of the middle third  with a degenerative flap tear being present this was ablated with use of the wand as well. Next a small incision was made with C-arm guidance and drill hole made with a subchondroplasty insertion device. After this was placed in the appropriate position based on AP and lateral imaging the center core was removed and after mixing the set paste of the bone substitute was injected into the area getting good fill in the medial tibia from the underneath the anterior cruciate ligament and tibial spines to close to the lateral border of the tibia. After placing this the pin was held in place and status was withdrawn slightly to it would fill that tunnel as well. After this this was set the trochars removed and the knee looked at again and there was no foreign body from this procedure left into the joint the knee was thoroughly irrigated and argentation was withdrawn the wounds were infiltrated with a total 30 cc half percent Sensorcaine followed by closure of wounds with simple interrupted 4-0 nylon 1 suture to each incision. Xeroform 4 x 4's web roll and Ace wrap applied  PLAN OF CARE: Discharge to home after PACU  PATIENT DISPOSITION:  PACU - hemodynamically stable.

## 2016-08-30 NOTE — Anesthesia Post-op Follow-up Note (Cosign Needed)
Anesthesia QCDR form completed.        

## 2016-08-30 NOTE — H&P (Signed)
Reviewed paper H+P, will be scanned into chart. No changes noted.  

## 2016-08-30 NOTE — Anesthesia Postprocedure Evaluation (Signed)
Anesthesia Post Note  Patient: Jen Venia Carbon  Procedure(s) Performed: Procedure(s) (LRB): KNEE ARTHROSCOPY WITH PARTIAL MEDIAL MENISECTOMY and Partial Lateral Menisectomy (Right) KNEE ARTHROSCOPY WITH SUBCHONDROPLASTY (Right)  Patient location during evaluation: PACU Anesthesia Type: General Level of consciousness: awake and alert and oriented Pain management: pain level controlled Vital Signs Assessment: post-procedure vital signs reviewed and stable Respiratory status: spontaneous breathing Cardiovascular status: blood pressure returned to baseline Anesthetic complications: no     Last Vitals:  Vitals:   08/30/16 1456 08/30/16 1503  BP: (!) 175/86 (!) 150/72  Pulse: 76 72  Resp: 16   Temp: 36.6 C     Last Pain:  Vitals:   08/30/16 1526  TempSrc:   PainSc: 1                  Ceriah Kohler

## 2016-08-30 NOTE — Discharge Instructions (Addendum)
Weightbearing as tolerated on right leg. Aspirin daily. Leave bandage on unless it slides down the leg remove entire bandage and covered 3 incisions with Band-Aids and rewrap with Ace wrap only    AMBULATORY SURGERY  DISCHARGE INSTRUCTIONS   1) The drugs that you were given will stay in your system until tomorrow so for the next 24 hours you should not:  A) Drive an automobile B) Make any legal decisions C) Drink any alcoholic beverage   2) You may resume regular meals tomorrow.  Today it is better to start with liquids and gradually work up to solid foods.  You may eat anything you prefer, but it is better to start with liquids, then soup and crackers, and gradually work up to solid foods.   3) Please notify your doctor immediately if you have any unusual bleeding, trouble breathing, redness and pain at the surgery site, drainage, fever, or pain not relieved by medication.    4) Additional Instructions:        Please contact your physician with any problems or Same Day Surgery at [phone], Monday through Friday 6 am to 4 pm, or [place] at [facility] number at [phone].

## 2016-08-31 ENCOUNTER — Encounter: Payer: Self-pay | Admitting: Orthopedic Surgery

## 2016-10-10 DIAGNOSIS — M1711 Unilateral primary osteoarthritis, right knee: Secondary | ICD-10-CM | POA: Diagnosis not present

## 2016-10-11 DIAGNOSIS — C50111 Malignant neoplasm of central portion of right female breast: Secondary | ICD-10-CM | POA: Diagnosis not present

## 2016-10-11 DIAGNOSIS — C50112 Malignant neoplasm of central portion of left female breast: Secondary | ICD-10-CM | POA: Diagnosis not present

## 2016-10-12 ENCOUNTER — Other Ambulatory Visit: Payer: Self-pay

## 2016-10-12 DIAGNOSIS — K279 Peptic ulcer, site unspecified, unspecified as acute or chronic, without hemorrhage or perforation: Secondary | ICD-10-CM

## 2016-10-12 MED ORDER — OMEPRAZOLE 20 MG PO CPDR: 20.0000 mg | DELAYED_RELEASE_CAPSULE | Freq: Every day | ORAL | 0 refills | Status: DC

## 2016-10-12 MED ORDER — CHLORTHALIDONE 25 MG PO TABS: 25.0000 mg | ORAL_TABLET | Freq: Every day | ORAL | 0 refills | Status: DC

## 2016-11-16 DIAGNOSIS — M25561 Pain in right knee: Secondary | ICD-10-CM | POA: Diagnosis not present

## 2016-11-16 DIAGNOSIS — Z9889 Other specified postprocedural states: Secondary | ICD-10-CM | POA: Diagnosis not present

## 2016-11-21 DIAGNOSIS — M1711 Unilateral primary osteoarthritis, right knee: Secondary | ICD-10-CM | POA: Diagnosis not present

## 2016-12-12 DIAGNOSIS — R69 Illness, unspecified: Secondary | ICD-10-CM | POA: Diagnosis not present

## 2017-02-15 ENCOUNTER — Ambulatory Visit (INDEPENDENT_AMBULATORY_CARE_PROVIDER_SITE_OTHER): Payer: Medicare HMO | Admitting: Family Medicine

## 2017-02-15 ENCOUNTER — Encounter: Payer: Self-pay | Admitting: Family Medicine

## 2017-02-15 VITALS — BP 130/80 | HR 80 | Ht 65.0 in | Wt 203.0 lb

## 2017-02-15 DIAGNOSIS — K279 Peptic ulcer, site unspecified, unspecified as acute or chronic, without hemorrhage or perforation: Secondary | ICD-10-CM

## 2017-02-15 DIAGNOSIS — I10 Essential (primary) hypertension: Secondary | ICD-10-CM | POA: Diagnosis not present

## 2017-02-15 DIAGNOSIS — E063 Autoimmune thyroiditis: Secondary | ICD-10-CM

## 2017-02-15 DIAGNOSIS — E782 Mixed hyperlipidemia: Secondary | ICD-10-CM | POA: Diagnosis not present

## 2017-02-15 DIAGNOSIS — K219 Gastro-esophageal reflux disease without esophagitis: Secondary | ICD-10-CM

## 2017-02-15 DIAGNOSIS — E038 Other specified hypothyroidism: Secondary | ICD-10-CM | POA: Diagnosis not present

## 2017-02-15 MED ORDER — OMEPRAZOLE 20 MG PO CPDR: 20.0000 mg | DELAYED_RELEASE_CAPSULE | Freq: Every day | ORAL | 2 refills | Status: DC

## 2017-02-15 MED ORDER — CELECOXIB 200 MG PO CAPS: 200.0000 mg | ORAL_CAPSULE | Freq: Every day | ORAL | 3 refills | Status: DC | PRN

## 2017-02-15 MED ORDER — CHLORTHALIDONE 25 MG PO TABS: 25.0000 mg | ORAL_TABLET | Freq: Every day | ORAL | 2 refills | Status: DC

## 2017-02-15 NOTE — Progress Notes (Signed)
Name: Hong Timm   MRN: [id number]    DOB: 1933/11/28   Date:02/15/2017       Progress Note  Subjective  Chief Complaint  Chief Complaint  Patient presents with  . Hypertension  . Gastroesophageal Reflux  . Arthritis    Hypertension  This is a chronic problem. The current episode started more than 1 year ago. The problem is unchanged. The problem is controlled. Pertinent negatives include no blurred vision, chest pain, headaches, malaise/fatigue, neck pain, orthopnea, palpitations, PND or shortness of breath. There are no associated agents to hypertension. Past treatments include diuretics. The current treatment provides moderate improvement. There are no compliance problems.  There is no history of angina, kidney disease, CAD/MI, CVA, heart failure, left ventricular hypertrophy, PVD or retinopathy. There is no history of chronic renal disease, a hypertension causing med or renovascular disease.  Gastroesophageal Reflux  She reports no abdominal pain, no belching, no chest pain, no choking, no coughing, no early satiety, no globus sensation, no heartburn, no hoarse voice, no nausea, no sore throat, no stridor or no wheezing. This is a new problem. The current episode started more than 1 year ago. The problem has been unchanged. The symptoms are aggravated by certain foods. Pertinent negatives include no melena or weight loss. She has tried a PPI for the symptoms. The treatment provided moderate relief.  Arthritis  Presents for follow-up visit. She complains of pain and stiffness. The symptoms have been stable. Pertinent negatives include no diarrhea, dysuria, fever, rash or weight loss. (Improving)    No problem-specific Assessment & Plan notes found for this encounter.   Past Medical History:  Diagnosis Date  . Arthritis    thumbs, hands  . Depression   . GERD (gastroesophageal reflux disease)   . Headache    occasional - pinched nerve in neck  . Hypertension   .  Hypothyroidism   . Thyroid disease   . Wears dentures    full upper, partial lower    Past Surgical History:  Procedure Laterality Date  . CATARACT EXTRACTION Bilateral   . CHOLECYSTECTOMY    . COLONOSCOPY  2013   Dr Gustavo Lah  . HALLUX VALGUS AUSTIN Right 06/03/2015   Procedure: HALLUX VALGUS AUSTIN;  Surgeon: Samara Deist, DPM;  Location: [place];  Service: Podiatry;  Laterality: Right;  WITH POPLITEAL  . KNEE ARTHROSCOPY WITH MEDIAL MENISECTOMY Right 08/30/2016   Procedure: KNEE ARTHROSCOPY WITH PARTIAL MEDIAL MENISECTOMY and Partial Lateral Menisectomy;  Surgeon: Hessie Knows, MD;  Location: [facility];  Service: Orthopedics;  Laterality: Right;  . KNEE ARTHROSCOPY WITH SUBCHONDROPLASTY Right 08/30/2016   Procedure: KNEE ARTHROSCOPY WITH SUBCHONDROPLASTY;  Surgeon: Hessie Knows, MD;  Location: [facility];  Service: Orthopedics;  Laterality: Right;  . MASTECTOMY Bilateral   . ROTATOR CUFF REPAIR Bilateral   . TONSILLECTOMY    . TOTAL KNEE ARTHROPLASTY Left   . VAGINAL HYSTERECTOMY      History reviewed. No pertinent family history.  Social History   Socioeconomic History  . Marital status: Widowed    Spouse name: Not on file  . Number of children: Not on file  . Years of education: Not on file  . Highest education level: Not on file  Social Needs  . Financial resource strain: Not on file  . Food insecurity - worry: Not on file  . Food insecurity - inability: Not on file  . Transportation needs - medical: Not on file  . Transportation needs - non-medical: Not on file  Occupational History  . Not on file  Tobacco Use  . Smoking status: Former Research scientist (life sciences)  . Smokeless tobacco: Never Used  . Tobacco comment: quit 30+ yrs ago  Substance and Sexual Activity  . Alcohol use: No    Alcohol/week: 0.0 oz  . Drug use: No  . Sexual activity: Yes  Other Topics Concern  . Not on file  Social History Narrative  . Not on file    Allergies  Allergen Reactions  .  Adhesive [Tape] Dermatitis    Can not use plastic tape. Paper tape ok   . Penicillin G Itching    Outpatient Medications Prior to Visit  Medication Sig Dispense Refill  . busPIRone (BUSPAR) 5 MG tablet Take 1 tablet (5 mg total) by mouth 2 (two) times daily. (Patient taking differently: Take 5 mg by mouth 2 (two) times daily as needed. ) 180 tablet 1  . triamcinolone (NASACORT ALLERGY 24HR) 55 MCG/ACT AERO nasal inhaler Place 2 sprays into the nose daily. (Patient taking differently: Place 2 sprays into the nose daily as needed. ) 1 Inhaler 11  . celecoxib (CELEBREX) 200 MG capsule Take 1 capsule by mouth daily as needed for moderate pain.     . chlorthalidone (HYGROTON) 25 MG tablet Take 1 tablet (25 mg total) by mouth daily. 90 tablet 0  . omeprazole (PRILOSEC) 20 MG capsule Take 1 capsule (20 mg total) by mouth daily. 90 capsule 0  . HYDROcodone-acetaminophen (NORCO) 5-325 MG tablet Take 1-2 tablets by mouth every 6 (six) hours as needed for moderate pain. 50 tablet 0   No facility-administered medications prior to visit.     Review of Systems  Constitutional: Negative for chills, fever, malaise/fatigue and weight loss.  HENT: Negative for ear discharge, ear pain, hoarse voice and sore throat.   Eyes: Negative for blurred vision.  Respiratory: Negative for cough, sputum production, choking, shortness of breath and wheezing.   Cardiovascular: Negative for chest pain, palpitations, orthopnea, leg swelling and PND.  Gastrointestinal: Negative for abdominal pain, blood in stool, constipation, diarrhea, heartburn, melena and nausea.  Genitourinary: Negative for dysuria, frequency, hematuria and urgency.  Musculoskeletal: Positive for arthritis and stiffness. Negative for back pain, joint pain, myalgias and neck pain.  Skin: Negative for rash.  Neurological: Negative for dizziness, tingling, sensory change, focal weakness and headaches.  Endo/Heme/Allergies: Negative for environmental  allergies and polydipsia. Does not bruise/bleed easily.  Psychiatric/Behavioral: Negative for depression and suicidal ideas. The patient is not nervous/anxious and does not have insomnia.      Objective  Vitals:   02/15/17 1012  BP: 130/80  Pulse: 80  Weight: 203 lb (92.1 kg)  Height: 5\' 5"  (1.651 m)    Physical Exam  Constitutional: She is well-developed, well-nourished, and in no distress. No distress.  HENT:  Head: Normocephalic and atraumatic.  Right Ear: External ear normal.  Left Ear: External ear normal.  Nose: Nose normal.  Mouth/Throat: Oropharynx is clear and moist.  Eyes: Conjunctivae and EOM are normal. Pupils are equal, round, and reactive to light. Right eye exhibits no discharge. Left eye exhibits no discharge.  Neck: Normal range of motion. Neck supple. No JVD present. No thyromegaly present.  Cardiovascular: Normal rate, regular rhythm, normal heart sounds and intact distal pulses. Exam reveals no gallop and no friction rub.  No murmur heard. Pulmonary/Chest: Effort normal and breath sounds normal. She has no wheezes. She has no rales.  Abdominal: Soft. Bowel sounds are normal. She exhibits no mass. There is  no tenderness. There is no guarding.  Musculoskeletal: Normal range of motion. She exhibits no edema.  Lymphadenopathy:    She has no cervical adenopathy.  Neurological: She is alert. She has normal reflexes.  Skin: Skin is warm and dry. She is not diaphoretic.  Psychiatric: Mood and affect normal.  Nursing note and vitals reviewed.     Assessment & Plan  Problem List Items Addressed This Visit      Cardiovascular and Mediastinum   Essential (primary) hypertension - Primary   Relevant Medications   chlorthalidone (HYGROTON) 25 MG tablet   Other Relevant Orders   Renal function panel     Digestive   Gastroduodenal ulcer   Relevant Medications   omeprazole (PRILOSEC) 20 MG capsule   Gastroesophageal reflux disease   Relevant Medications    omeprazole (PRILOSEC) 20 MG capsule     Endocrine   Hypothyroidism   Relevant Orders   TSH     Other   Combined fat and carbohydrate induced hyperlipemia   Relevant Medications   chlorthalidone (HYGROTON) 25 MG tablet   Other Relevant Orders   Lipid panel      Meds ordered this encounter  Medications  . chlorthalidone (HYGROTON) 25 MG tablet    Sig: Take 1 tablet (25 mg total) by mouth daily.    Dispense:  90 tablet    Refill:  2  . omeprazole (PRILOSEC) 20 MG capsule    Sig: Take 1 capsule (20 mg total) by mouth daily.    Dispense:  90 capsule    Refill:  2  . celecoxib (CELEBREX) 200 MG capsule    Sig: Take 1 capsule (200 mg total) by mouth daily as needed for moderate pain.    Dispense:  90 capsule    Refill:  3      Dr. Otilio Miu [facility]  02/15/17

## 2017-02-16 LAB — RENAL FUNCTION PANEL
Albumin: 4.6 g/dL (ref 3.5–4.7)
BUN/Creatinine Ratio: 19 (ref 12–28)
BUN: 18 mg/dL (ref 8–27)
CHLORIDE: 98 mmol/L (ref 96–106)
CO2: 26 mmol/L (ref 20–29)
Calcium: 10.3 mg/dL (ref 8.7–10.3)
Creatinine, Ser: 0.97 mg/dL (ref 0.57–1.00)
GFR calc Af Amer: 63 mL/min/{1.73_m2} (ref >59)
GFR calc non Af Amer: 55 mL/min/{1.73_m2} — ABNORMAL LOW (ref >59)
GLUCOSE: 81 mg/dL (ref 65–99)
PHOSPHORUS: 3.1 mg/dL (ref 2.5–4.5)
POTASSIUM: 3.6 mmol/L (ref 3.5–5.2)
Sodium: 142 mmol/L (ref 134–144)

## 2017-02-16 LAB — LIPID PANEL
Chol/HDL Ratio: 6.9 ratio — ABNORMAL HIGH (ref 0.0–4.4)
Cholesterol, Total: 227 mg/dL — ABNORMAL HIGH (ref 100–199)
HDL: 33 mg/dL — ABNORMAL LOW (ref >39)
TRIGLYCERIDES: 534 mg/dL — AB (ref 0–149)

## 2017-02-16 LAB — TSH: TSH: 4.74 u[IU]/mL — ABNORMAL HIGH (ref 0.450–4.500)

## 2017-02-23 ENCOUNTER — Ambulatory Visit: Payer: Self-pay

## 2017-03-02 ENCOUNTER — Encounter: Payer: Self-pay | Admitting: Family Medicine

## 2017-03-02 ENCOUNTER — Ambulatory Visit (INDEPENDENT_AMBULATORY_CARE_PROVIDER_SITE_OTHER): Payer: Medicare HMO | Admitting: Family Medicine

## 2017-03-02 VITALS — BP 120/80 | HR 60 | Ht 63.0 in | Wt 201.0 lb

## 2017-03-02 DIAGNOSIS — A46 Erysipelas: Secondary | ICD-10-CM

## 2017-03-02 MED ORDER — DOXYCYCLINE HYCLATE 100 MG PO TABS: 100.0000 mg | ORAL_TABLET | Freq: Two times a day (BID) | ORAL | 0 refills | Status: DC

## 2017-03-02 NOTE — Progress Notes (Signed)
Name: Kaitlyn Oliver   MRN: [id number]    DOB: 1933/12/04   Date:03/02/2017       Progress Note  Subjective  Chief Complaint  Chief Complaint  Patient presents with  . Foot Pain    R) foot pain- involves the 3,4,and 5th digits    Foot Pain  This is a new problem. The current episode started in the past 7 days (2). The problem occurs constantly. The problem has been waxing and waning. Associated symptoms include a rash. Pertinent negatives include no abdominal pain, anorexia, arthralgias, change in bowel habit, chest pain, chills, congestion, coughing, diaphoresis, fatigue, fever, headaches, joint swelling, myalgias, nausea, neck pain, numbness, sore throat, swollen glands, vertigo, visual change, vomiting or weakness. The symptoms are aggravated by standing and walking. Treatments tried: vinegar/athletes foot powder. The treatment provided mild relief.    No problem-specific Assessment & Plan notes found for this encounter.   Past Medical History:  Diagnosis Date  . Arthritis    thumbs, hands  . Depression   . GERD (gastroesophageal reflux disease)   . Headache    occasional - pinched nerve in neck  . Hypertension   . Hypothyroidism   . Thyroid disease   . Wears dentures    full upper, partial lower    Past Surgical History:  Procedure Laterality Date  . CATARACT EXTRACTION Bilateral   . CHOLECYSTECTOMY    . COLONOSCOPY  2013   Dr Gustavo Lah  . HALLUX VALGUS AUSTIN Right 06/03/2015   Procedure: HALLUX VALGUS AUSTIN;  Surgeon: Samara Deist, DPM;  Location: [place];  Service: Podiatry;  Laterality: Right;  WITH POPLITEAL  . KNEE ARTHROSCOPY WITH MEDIAL MENISECTOMY Right 08/30/2016   Procedure: KNEE ARTHROSCOPY WITH PARTIAL MEDIAL MENISECTOMY and Partial Lateral Menisectomy;  Surgeon: Hessie Knows, MD;  Location: [facility];  Service: Orthopedics;  Laterality: Right;  . KNEE ARTHROSCOPY WITH SUBCHONDROPLASTY Right 08/30/2016   Procedure: KNEE ARTHROSCOPY WITH  SUBCHONDROPLASTY;  Surgeon: Hessie Knows, MD;  Location: [facility];  Service: Orthopedics;  Laterality: Right;  . MASTECTOMY Bilateral   . ROTATOR CUFF REPAIR Bilateral   . TONSILLECTOMY    . TOTAL KNEE ARTHROPLASTY Left   . VAGINAL HYSTERECTOMY      History reviewed. No pertinent family history.  Social History   Socioeconomic History  . Marital status: Widowed    Spouse name: Not on file  . Number of children: Not on file  . Years of education: Not on file  . Highest education level: Not on file  Social Needs  . Financial resource strain: Not on file  . Food insecurity - worry: Not on file  . Food insecurity - inability: Not on file  . Transportation needs - medical: Not on file  . Transportation needs - non-medical: Not on file  Occupational History  . Not on file  Tobacco Use  . Smoking status: Former Research scientist (life sciences)  . Smokeless tobacco: Never Used  . Tobacco comment: quit 30+ yrs ago  Substance and Sexual Activity  . Alcohol use: No    Alcohol/week: 0.0 oz  . Drug use: No  . Sexual activity: Yes  Other Topics Concern  . Not on file  Social History Narrative  . Not on file    Allergies  Allergen Reactions  . Adhesive [Tape] Dermatitis    Can not use plastic tape. Paper tape ok   . Penicillin G Itching    Outpatient Medications Prior to Visit  Medication Sig Dispense Refill  . busPIRone (  BUSPAR) 5 MG tablet Take 1 tablet (5 mg total) by mouth 2 (two) times daily. (Patient taking differently: Take 5 mg by mouth 2 (two) times daily as needed. ) 180 tablet 1  . celecoxib (CELEBREX) 200 MG capsule Take 1 capsule (200 mg total) by mouth daily as needed for moderate pain. 90 capsule 3  . chlorthalidone (HYGROTON) 25 MG tablet Take 1 tablet (25 mg total) by mouth daily. 90 tablet 2  . omeprazole (PRILOSEC) 20 MG capsule Take 1 capsule (20 mg total) by mouth daily. 90 capsule 2  . triamcinolone (NASACORT ALLERGY 24HR) 55 MCG/ACT AERO nasal inhaler Place 2 sprays into the nose  daily. (Patient not taking: Reported on 03/02/2017) 1 Inhaler 11   No facility-administered medications prior to visit.     Review of Systems  Constitutional: Negative for chills, diaphoresis, fatigue, fever, malaise/fatigue and weight loss.  HENT: Negative for congestion, ear discharge, ear pain and sore throat.   Eyes: Negative for blurred vision.  Respiratory: Negative for cough, sputum production, shortness of breath and wheezing.   Cardiovascular: Negative for chest pain, palpitations and leg swelling.  Gastrointestinal: Negative for abdominal pain, anorexia, blood in stool, change in bowel habit, constipation, diarrhea, heartburn, melena, nausea and vomiting.  Genitourinary: Negative for dysuria, frequency, hematuria and urgency.  Musculoskeletal: Negative for arthralgias, back pain, joint pain, joint swelling, myalgias and neck pain.  Skin: Positive for rash.  Neurological: Negative for dizziness, vertigo, tingling, sensory change, focal weakness, weakness, numbness and headaches.  Endo/Heme/Allergies: Negative for environmental allergies and polydipsia. Does not bruise/bleed easily.  Psychiatric/Behavioral: Negative for depression and suicidal ideas. The patient is not nervous/anxious and does not have insomnia.      Objective  Vitals:   03/02/17 1449  BP: 120/80  Pulse: 60  Weight: 201 lb (91.2 kg)  Height: 5\' 3"  (1.6 m)    Physical Exam  Constitutional: She is well-developed, well-nourished, and in no distress. No distress.  HENT:  Head: Normocephalic and atraumatic.  Right Ear: External ear normal.  Left Ear: External ear normal.  Nose: Nose normal.  Mouth/Throat: Oropharynx is clear and moist.  Eyes: Conjunctivae and EOM are normal. Pupils are equal, round, and reactive to light. Right eye exhibits no discharge. Left eye exhibits no discharge.  Neck: Normal range of motion. Neck supple. No JVD present. No thyromegaly present.  Cardiovascular: Normal rate, regular  rhythm, normal heart sounds and intact distal pulses. Exam reveals no gallop and no friction rub.  No murmur heard. Pulmonary/Chest: Effort normal and breath sounds normal. She has no wheezes. She has no rales.  Abdominal: Soft. Bowel sounds are normal. She exhibits no mass. There is no tenderness. There is no guarding.  Musculoskeletal: Normal range of motion. She exhibits no edema.  Lymphadenopathy:    She has no cervical adenopathy.  Neurological: She is alert. She has normal reflexes.  Skin: Skin is warm and dry. She is not diaphoretic. There is erythema.  Psychiatric: Mood and affect normal.  Nursing note and vitals reviewed.     Assessment & Plan  Problem List Items Addressed This Visit    None    Visit Diagnoses    Erysipelas    -  Primary   also advil   Relevant Medications   doxycycline (VIBRA-TABS) 100 MG tablet      Meds ordered this encounter  Medications  . doxycycline (VIBRA-TABS) 100 MG tablet    Sig: Take 1 tablet (100 mg total) by mouth 2 (two) times  daily.    Dispense:  20 tablet    Refill:  0      Dr. Otilio Miu [facility] [place]  03/02/17

## 2017-03-02 NOTE — Patient Instructions (Signed)
Erysipelas  Erysipelas is an infection that affects the skin and the tissues that are near the surface of the skin. It causes the skin to become red, swollen, and painful. The infection is most common on the legs but may also affect other areas, such as the face. With treatment, the infection usually goes away in a few days. If not treated, the infection can spread or lead to other problems, such as abscesses.  What are the causes?  Erysipelas is caused by bacteria. Most often, it is caused by bacteria called streptococci. The bacteria often enter through a break in the skin, such as a cut, surgical incision, burn, insect bite, open sore, or crack in the skin. Sometimes the source where the bacteria entered is not known.  What increases the risk?  Some people are at an increased risk for developing erysipelas, including:   Young children.   Elderly people.   People with a weakened body defense system (immune system), such as people with HIV or AIDS.   People who have diabetes.   People who drink too much alcohol.   People who have had recent surgery.   People with yeast infections of the skin.   People who have swollen legs.    What are the signs or symptoms?  The infection causes a reddened area on the skin. This reddened area may:   Be painful and swollen.   Have a distinct border around it.   Feel itchy and hot.   Develop blisters.    Other symptoms may include:   Fever.   Chills.   Nausea and vomiting.   Swollen glands (lymph nodes).   Headache.   Fatigue.   Loss of appetite.    How is this diagnosed?  Your health care provider will take your medical history and do a physical exam. He or she will usually be able to diagnose erysipelas by closely examining your skin.  How is this treated?  Erysipelas can usually be treated effectively with antibiotic medicines. The infection usually gets better within a few days of treatment.  Follow these instructions at home:   Take medicines only as  directed by your health care provider.   Take your antibiotic medicine as directed by your health care provider. Finish the antibiotic even if you start to feel better.   If the skin infection is on your leg or arm, elevate the leg or arm to help reduce swelling.   Do not put creams or lotions on the affected area of your skin unless your health care provider instructs you to do that.   Do not share bedding, towels, or washcloths (linens) with other people. Using only your own linens will help to prevent the infection from spreading to others.   Keep all follow-up visits as directed by your health care provider. This is important.  Contact a health care provider if:   You have pain or discomfort that is not controlled by medicines.   Your red area of skin gets larger or turns dark in color.   Your skin infection returns in the same area or appears in another area.  Get help right away if:   Your fever is getting worse.   Your feelings of illness are getting worse.   You notice red streaks coming from the infected area.  This information is not intended to replace advice given to you by your health care provider. Make sure you discuss any questions you have with your health care 

## 2017-04-06 ENCOUNTER — Ambulatory Visit (INDEPENDENT_AMBULATORY_CARE_PROVIDER_SITE_OTHER): Payer: Medicare HMO

## 2017-04-06 VITALS — BP 124/80 | HR 68 | Temp 97.9°F | Resp 12 | Ht 63.0 in | Wt 203.6 lb

## 2017-04-06 DIAGNOSIS — Z0000 Encounter for general adult medical examination without abnormal findings: Secondary | ICD-10-CM | POA: Diagnosis not present

## 2017-04-06 DIAGNOSIS — Z23 Encounter for immunization: Secondary | ICD-10-CM | POA: Diagnosis not present

## 2017-04-06 NOTE — Patient Instructions (Signed)
Ms. Kaitlyn Oliver , Thank you for taking time to come for your Medicare Wellness Visit. I appreciate your ongoing commitment to your health goals. Please review the following plan we discussed and let me know if I can assist you in the future.   Screening recommendations/referrals: Colonoscopy: Completed 02/24/12. Repeat every 5 years. Colorectal screening no longer required Mammogram: Breast cancer screening no longer required  Bone Density: Osteoporotic screening no longer required Recommended yearly ophthalmology/optometry visit for glaucoma screening and checkup Recommended yearly dental visit for hygiene and checkup  Vaccinations: Influenza vaccine: Up to date Pneumococcal vaccine: PCV13 completed today. Return in one year for PPSV23 Tdap vaccine: Declined. Please call your insurance company to determine your out of pocket expense. You may also receive this vaccine at your local pharmacy or Health Dept. Shingles vaccine: Declined. Please call your insurance company to determine your out of pocket expense. You may also receive this vaccine at your local pharmacy or Health Dept.  Advanced directives: Please bring a copy of your POA (Power of Attorney) and/or Living Will to your next appointment.   Conditions/risks identified: Recommend to drink at least 6-8 8oz glasses of water per day.  Next appointment: Please schedule an appointment with Kaitlyn Oliver within the next 30 days for follow up after today's Annual Wellness Visit.  Please schedule your Annual Wellness Visit with your Nurse Health Advisor in one year.  Preventive Care 82 Years and Older, Female Preventive care refers to lifestyle choices and visits with your health care provider that can promote health and wellness. What does preventive care include?  A yearly physical exam. This is also called an annual well check.  Dental exams once or twice a year.  Routine eye exams. Ask your health care provider how often you should have your  eyes checked.  Personal lifestyle choices, including:  Daily care of your teeth and gums.  Regular physical activity.  Eating a healthy diet.  Avoiding tobacco and drug use.  Limiting alcohol use.  Practicing safe sex.  Taking low-dose aspirin every day.  Taking vitamin and mineral supplements as recommended by your health care provider. What happens during an annual well check? The services and screenings done by your health care provider during your annual well check will depend on your age, overall health, lifestyle risk factors, and family history of disease. Counseling  Your health care provider may ask you questions about your:  Alcohol use.  Tobacco use.  Drug use.  Emotional well-being.  Home and relationship well-being.  Sexual activity.  Eating habits.  History of falls.  Memory and ability to understand (cognition).  Work and work Statistician.  Reproductive health. Screening  You may have the following tests or measurements:  Height, weight, and BMI.  Blood pressure.  Lipid and cholesterol levels. These may be checked every 5 years, or more frequently if you are over 39 years old.  Skin check.  Lung cancer screening. You may have this screening every year starting at age 22 if you have a 30-pack-year history of smoking and currently smoke or have quit within the past 15 years.  Fecal occult blood test (FOBT) of the stool. You may have this test every year starting at age 21.  Flexible sigmoidoscopy or colonoscopy. You may have a sigmoidoscopy every 5 years or a colonoscopy every 10 years starting at age 97.  Hepatitis C blood test.  Hepatitis B blood test.  Sexually transmitted disease (STD) testing.  Diabetes screening. This is done by checking  your blood sugar (glucose) after you have not eaten for a while (fasting). You may have this done every 1-3 years.  Bone density scan. This is done to screen for osteoporosis. You may have this  done starting at age 33.  Mammogram. This may be done every 1-2 years. Talk to your health care provider about how often you should have regular mammograms. Talk with your health care provider about your test results, treatment options, and if necessary, the need for more tests. Vaccines  Your health care provider may recommend certain vaccines, such as:  Influenza vaccine. This is recommended every year.  Tetanus, diphtheria, and acellular pertussis (Tdap, Td) vaccine. You may need a Td booster every 10 years.  Zoster vaccine. You may need this after age 42.  Pneumococcal 13-valent conjugate (PCV13) vaccine. One dose is recommended after age 61.  Pneumococcal polysaccharide (PPSV23) vaccine. One dose is recommended after age 21. Talk to your health care provider about which screenings and vaccines you need and how often you need them. This information is not intended to replace advice given to you by your health care provider. Make sure you discuss any questions you have with your health care provider. Document Released: 03/27/2015 Document Revised: 11/18/2015 Document Reviewed: 12/30/2014 Elsevier Interactive Patient Education  2017 [place] Prevention in the Home Falls can cause injuries. They can happen to people of all ages. There are many things you can do to make your home safe and to help prevent falls. What can I do on the outside of my home?  Regularly fix the edges of walkways and driveways and fix any cracks.  Remove anything that might make you trip as you walk through a door, such as a raised step or threshold.  Trim any bushes or trees on the path to your home.  Use bright outdoor lighting.  Clear any walking paths of anything that might make someone trip, such as rocks or tools.  Regularly check to see if handrails are loose or broken. Make sure that both sides of any steps have handrails.  Any raised decks and porches should have guardrails on the  edges.  Have any leaves, snow, or ice cleared regularly.  Use sand or salt on walking paths during winter.  Clean up any spills in your garage right away. This includes oil or grease spills. What can I do in the bathroom?  Use night lights.  Install grab bars by the toilet and in the tub and shower. Do not use towel bars as grab bars.  Use non-skid mats or decals in the tub or shower.  If you need to sit down in the shower, use a plastic, non-slip stool.  Keep the floor dry. Clean up any water that spills on the floor as soon as it happens.  Remove soap buildup in the tub or shower regularly.  Attach bath mats securely with double-sided non-slip rug tape.  Do not have throw rugs and other things on the floor that can make you trip. What can I do in the bedroom?  Use night lights.  Make sure that you have a light by your bed that is easy to reach.  Do not use any sheets or blankets that are too big for your bed. They should not hang down onto the floor.  Have a firm chair that has side arms. You can use this for support while you get dressed.  Do not have throw rugs and other things on the floor  that can make you trip. What can I do in the kitchen?  Clean up any spills right away.  Avoid walking on wet floors.  Keep items that you use a lot in easy-to-reach places.  If you need to reach something above you, use a strong step stool that has a grab bar.  Keep electrical cords out of the way.  Do not use floor polish or wax that makes floors slippery. If you must use wax, use non-skid floor wax.  Do not have throw rugs and other things on the floor that can make you trip. What can I do with my stairs?  Do not leave any items on the stairs.  Make sure that there are handrails on both sides of the stairs and use them. Fix handrails that are broken or loose. Make sure that handrails are as long as the stairways.  Check any carpeting to make sure that it is firmly  attached to the stairs. Fix any carpet that is loose or worn.  Avoid having throw rugs at the top or bottom of the stairs. If you do have throw rugs, attach them to the floor with carpet tape.  Make sure that you have a light switch at the top of the stairs and the bottom of the stairs. If you do not have them, ask someone to add them for you. What else can I do to help prevent falls?  Wear shoes that:  Do not have high heels.  Have rubber bottoms.  Are comfortable and fit you well.  Are closed at the toe. Do not wear sandals.  If you use a stepladder:  Make sure that it is fully opened. Do not climb a closed stepladder.  Make sure that both sides of the stepladder are locked into place.  Ask someone to hold it for you, if possible.  Clearly mark and make sure that you can see:  Any grab bars or handrails.  First and last steps.  Where the edge of each step is.  Use tools that help you move around (mobility aids) if they are needed. These include:  Canes.  Walkers.  Scooters.  Crutches.  Turn on the lights when you go into a dark area. Replace any light bulbs as soon as they burn out.  Set up your furniture so you have a clear path. Avoid moving your furniture around.  If any of your floors are uneven, fix them.  If there are any pets around you, be aware of where they are.  Review your medicines with your doctor. Some medicines can make you feel dizzy. This can increase your chance of falling. Ask your doctor what other things that you can do to help prevent falls. This information is not intended to replace advice given to you by your health care provider. Make sure you discuss any questions you have with your health care provider. Document Released: 12/25/2008 Document Revised: 08/06/2015 Document Reviewed: 04/04/2014 Elsevier Interactive Patient Education  2017 [employer].

## 2017-04-06 NOTE — Progress Notes (Signed)
Subjective:   Kaitlyn Oliver is a 84 y.o. female who presents for Medicare Annual (Subsequent) preventive examination.  Review of Systems:  N/A Cardiac Risk Factors include: advanced age (>13men, >63 women);dyslipidemia;hypertension;obesity (BMI >30kg/m2)     Objective:     Vitals: BP 124/80 (BP Location: Left Arm, Patient Position: Sitting, Cuff Size: Large)   Pulse 68   Temp 97.9 F (36.6 C) (Oral)   Resp 12   Ht 5\' 3"  (1.6 m)   Wt 203 lb 9.6 oz (92.4 kg)   BMI 36.07 kg/m   Body mass index is 36.07 kg/m.  Advanced Directives 04/06/2017 06/03/2015 09/11/2014  Does Patient Have a Medical Advance Directive? Yes Yes No;Yes  Type of Paramedic of [place];Living will Living will Living will  Does patient want to make changes to medical advance directive? - No - Patient declined -  Copy of [place] in Chart? No - copy requested No - copy requested No - copy requested    Tobacco Social History   Tobacco Use  Smoking Status Former Smoker  . Packs/day: 0.25  . Years: 10.00  . Pack years: 2.50  . Types: Cigarettes  . Last attempt to quit: 1991  . Years since quitting: 28.0  Smokeless Tobacco Never Used  Tobacco Comment   smoking cessation materials not required     Counseling given: No Comment: smoking cessation materials not required   Clinical Intake:  Pre-visit preparation completed: Yes  Pain : No/denies pain     BMI - recorded: 36.07 Nutritional Status: BMI > 30  Obese Nutritional Risks: None Diabetes: No  How often do you need to have someone help you when you read instructions, pamphlets, or other written materials from your doctor or pharmacy?: 1 - Never  Interpreter Needed?: No  Information entered by :: AEversole, LPN  Past Medical History:  Diagnosis Date  . Arthritis    thumbs, hands  . Depression   . GERD (gastroesophageal reflux disease)   . Headache    occasional - pinched nerve in neck    . Hypertension   . Hypothyroidism   . Thyroid disease   . Wears dentures    full upper, partial lower   Past Surgical History:  Procedure Laterality Date  . CATARACT EXTRACTION Bilateral   . CHOLECYSTECTOMY    . COLONOSCOPY  2013   Dr Gustavo Lah  . HALLUX VALGUS AUSTIN Right 06/03/2015   Procedure: HALLUX VALGUS AUSTIN;  Surgeon: Samara Deist, DPM;  Location: [facility];  Service: Podiatry;  Laterality: Right;  WITH POPLITEAL  . KNEE ARTHROSCOPY WITH MEDIAL MENISECTOMY Right 08/30/2016   Procedure: KNEE ARTHROSCOPY WITH PARTIAL MEDIAL MENISECTOMY and Partial Lateral Menisectomy;  Surgeon: Hessie Knows, MD;  Location: [facility];  Service: Orthopedics;  Laterality: Right;  . KNEE ARTHROSCOPY WITH SUBCHONDROPLASTY Right 08/30/2016   Procedure: KNEE ARTHROSCOPY WITH SUBCHONDROPLASTY;  Surgeon: Hessie Knows, MD;  Location: [facility];  Service: Orthopedics;  Laterality: Right;  . MASTECTOMY Bilateral   . ROTATOR CUFF REPAIR Bilateral   . TONSILLECTOMY    . TOTAL KNEE ARTHROPLASTY Left   . VAGINAL HYSTERECTOMY     Family History  Problem Relation Age of Onset  . Pulmonary embolism Mother   . Healthy Father    Social History   Socioeconomic History  . Marital status: Soil scientist    Spouse name: None  . Number of children: 1  . Years of education: None  . Highest education level: 12th  grade  Social Needs  . Financial resource strain: Not hard at all  . Food insecurity - worry: Never true  . Food insecurity - inability: Never true  . Transportation needs - medical: No  . Transportation needs - non-medical: No  Occupational History  . Occupation: Retired  Tobacco Use  . Smoking status: Former Smoker    Packs/day: 0.25    Years: 10.00    Pack years: 2.50    Types: Cigarettes    Last attempt to quit: 1991    Years since quitting: 28.0  . Smokeless tobacco: Never Used  . Tobacco comment: smoking cessation materials not required  Substance and Sexual Activity  .  Alcohol use: No    Alcohol/week: 0.0 oz  . Drug use: No  . Sexual activity: Not Currently  Other Topics Concern  . None  Social History Narrative  . None    Outpatient Encounter Medications as of 04/06/2017  Medication Sig  . celecoxib (CELEBREX) 200 MG capsule Take 1 capsule (200 mg total) by mouth daily as needed for moderate pain.  . chlorthalidone (HYGROTON) 25 MG tablet Take 1 tablet (25 mg total) by mouth daily.  Marland Kitchen omeprazole (PRILOSEC) 20 MG capsule Take 1 capsule (20 mg total) by mouth daily.  Marland Kitchen triamcinolone (NASACORT ALLERGY 24HR) 55 MCG/ACT AERO nasal inhaler Place 2 sprays into the nose daily.  . busPIRone (BUSPAR) 5 MG tablet Take 1 tablet (5 mg total) by mouth 2 (two) times daily. (Patient not taking: Reported on 04/06/2017)  . doxycycline (VIBRA-TABS) 100 MG tablet Take 1 tablet (100 mg total) by mouth 2 (two) times daily.   No facility-administered encounter medications on file as of 04/06/2017.     Activities of Daily Living In your present state of health, do you have any difficulty performing the following activities: 04/06/2017 08/30/2016  Hearing? N N  Comment denies wearing hearing aids -  Vision? N N  Comment wears eyeglasses -  Difficulty concentrating or making decisions? Y N  Comment short term memory loss -  Walking or climbing stairs? N Y  Dressing or bathing? N N  Doing errands, shopping? N -  Preparing Food and eating ? N -  Comment full set upper and partial lower dentures -  Using the Toilet? N -  In the past six months, have you accidently leaked urine? Y -  Comment wears pad -  Do you have problems with loss of bowel control? Y -  Comment occasional loose stool -  Managing your Medications? N -  Managing your Finances? N -  Housekeeping or managing your Housekeeping? N -  Some recent data might be hidden    Patient Care Team: Juline Patch, MD as PCP - General (Family Medicine)    Assessment:   This is a routine wellness examination for  Nelsy.  Exercise Activities and Dietary recommendations Current Exercise Habits: Structured exercise class, Type of exercise: Other - see comments(dancing - Zumba), Time (Minutes): 60, Frequency (Times/Week): 2, Weekly Exercise (Minutes/Week): 120, Intensity: Mild, Exercise limited by: None identified  Goals    None      Fall Risk Fall Risk  04/06/2017 06/07/2016 05/11/2015 09/11/2014  Falls in the past year? No No No No   Is the patient's home free of loose throw rugs in walkways, pet beds, electrical cords, etc?   Yes Does the patient have any grab bars in the bathroom? Yes  Does the patient use a shower chair when bathing? Yes Does the  patient have any stairs in or around the home? Yes If so, are there any handrails?  Yes Does the patient have adequate lighting?  Yes Does the patient use a cane, walker or w/c? No Does the patient use of an elevated toilet seat? No  Timed Get Up and Go Performed: Yes. Pt ambulated 10 feet within 8 sec. Gait slow, steady and without use of an assistive device. No intervention required at this time. Fall risk prevention has been discussed.  Depression Screen PHQ 2/9 Scores 04/06/2017 06/07/2016 05/11/2015 09/11/2014  PHQ - 2 Score 0 0 0 0     Cognitive Function     6CIT Screen 04/06/2017  What Year? 0 points  What month? 0 points  What time? 0 points  Count back from 20 0 points  Months in reverse 0 points  Repeat phrase 0 points  Total Score 0    Immunization History  Administered Date(s) Administered  . Influenza-Unspecified 12/13/2014, 12/08/2015, 12/12/2016  . Pneumococcal Conjugate-13 04/06/2017    Qualifies for Shingles Vaccine? Yes. Due for Zostavax or Shingrix vaccine. Education has been provided regarding the importance of this vaccine. Pt has been advised to call her insurance company to determine her out of pocket expense. Advised she may also receive this vaccine at her local pharmacy or Health Dept. Verbalized acceptance and  understanding.  Due for Tdap vaccine. Declined my offer to administer today. Education has been provided regarding the importance of this vaccine but still declined. Pt has been advised to call her insurance company to determine her out of pocket expense. Advised she may also receive this vaccine at her local pharmacy or Health Dept. Verbalized acceptance and understanding.  Screening Tests Health Maintenance  Topic Date Due  . TETANUS/TDAP  04/06/2018 (Originally 11/19/1952)  . PNA vac Low Risk Adult (2 of 2 - PPSV23) 04/06/2018  . INFLUENZA VACCINE  Completed  . DEXA SCAN  Discontinued    Cancer Screenings: Lung: Low Dose CT Chest recommended if Age 67-80 years, 30 pack-year currently smoking OR have quit w/in 15years. Patient does not qualify. Breast:  Up to date on Mammogram? Yes. Breast cancer screening no longer required due to h/o B mastectomy.  Up to date of Bone Density/Dexa? No. Osteoporotic screening no longer required Colorectal: Colonoscopy completed 02/24/12. Repeat every 5 years. Colorectal screening no longer required  Additional Screenings: Hepatitis B/HIV/Syphillis: Does not qualify Hepatitis C Screening: Does not qualify     Plan:  I have personally reviewed and addressed the Medicare Annual Wellness questionnaire and have noted the following in the patient's chart:  A. Medical and social history B. Use of alcohol, tobacco or illicit drugs  C. Current medications and supplements D. Functional ability and status E.  Nutritional status F.  Physical activity G. Advance directives H. List of other physicians I.  Hospitalizations, surgeries, and ER visits in previous 12 months J.  [place] such as hearing and vision if needed, cognitive and depression L. Referrals and appointments - none  In addition, I have reviewed and discussed with patient certain preventive protocols, quality metrics, and best practice recommendations. A written personalized care  plan for preventive services as well as general preventive health recommendations were provided to patient.  Signed,  Aleatha Borer, LPN Nurse Health Advisor  MD Recommendations: Due for Tdap vaccine. Declined my offer to administer today. Education has been provided regarding the importance of this vaccine but still declined. Pt has been advised to call her insurance company to  determine her out of pocket expense. Advised she may also receive this vaccine at her local pharmacy or Health Dept. Verbalized acceptance and understanding.

## 2017-04-18 ENCOUNTER — Other Ambulatory Visit: Payer: Self-pay

## 2017-04-24 DIAGNOSIS — M1711 Unilateral primary osteoarthritis, right knee: Secondary | ICD-10-CM | POA: Diagnosis not present

## 2017-04-25 ENCOUNTER — Other Ambulatory Visit: Payer: Self-pay | Admitting: Orthopedic Surgery

## 2017-04-25 DIAGNOSIS — M1711 Unilateral primary osteoarthritis, right knee: Secondary | ICD-10-CM

## 2017-05-03 ENCOUNTER — Ambulatory Visit
Admission: RE | Admit: 2017-05-03 | Discharge: 2017-05-03 | Disposition: A | Discharge status: Home / Self Care | Payer: Medicare HMO | Source: Ambulatory Visit | Attending: Orthopedic Surgery | Admitting: Orthopedic Surgery

## 2017-05-03 DIAGNOSIS — M1711 Unilateral primary osteoarthritis, right knee: Secondary | ICD-10-CM | POA: Diagnosis not present

## 2017-05-03 DIAGNOSIS — M1611 Unilateral primary osteoarthritis, right hip: Secondary | ICD-10-CM | POA: Insufficient documentation

## 2017-05-03 DIAGNOSIS — M84451A Pathological fracture, right femur, initial encounter for fracture: Secondary | ICD-10-CM | POA: Diagnosis not present

## 2017-05-03 DIAGNOSIS — M179 Osteoarthritis of knee, unspecified: Secondary | ICD-10-CM | POA: Diagnosis not present

## 2017-05-03 DIAGNOSIS — M84461A Pathological fracture, right tibia, initial encounter for fracture: Secondary | ICD-10-CM | POA: Diagnosis not present

## 2017-05-08 DIAGNOSIS — K08409 Partial loss of teeth, unspecified cause, unspecified class: Secondary | ICD-10-CM | POA: Diagnosis not present

## 2017-05-08 DIAGNOSIS — R32 Unspecified urinary incontinence: Secondary | ICD-10-CM | POA: Diagnosis not present

## 2017-05-08 DIAGNOSIS — G8929 Other chronic pain: Secondary | ICD-10-CM | POA: Diagnosis not present

## 2017-05-08 DIAGNOSIS — K219 Gastro-esophageal reflux disease without esophagitis: Secondary | ICD-10-CM | POA: Diagnosis not present

## 2017-05-08 DIAGNOSIS — E669 Obesity, unspecified: Secondary | ICD-10-CM | POA: Diagnosis not present

## 2017-05-08 DIAGNOSIS — J302 Other seasonal allergic rhinitis: Secondary | ICD-10-CM | POA: Diagnosis not present

## 2017-05-08 DIAGNOSIS — I10 Essential (primary) hypertension: Secondary | ICD-10-CM | POA: Diagnosis not present

## 2017-05-08 DIAGNOSIS — Z6834 Body mass index (BMI) 34.0-34.9, adult: Secondary | ICD-10-CM | POA: Diagnosis not present

## 2017-05-08 DIAGNOSIS — Z791 Long term (current) use of non-steroidal anti-inflammatories (NSAID): Secondary | ICD-10-CM | POA: Diagnosis not present

## 2017-05-08 DIAGNOSIS — K5900 Constipation, unspecified: Secondary | ICD-10-CM | POA: Diagnosis not present

## 2017-05-10 DIAGNOSIS — M1711 Unilateral primary osteoarthritis, right knee: Secondary | ICD-10-CM | POA: Diagnosis not present

## 2017-05-17 ENCOUNTER — Encounter
Admission: RE | Admit: 2017-05-17 | Discharge: 2017-05-17 | Disposition: A | Discharge status: Home / Self Care | Payer: Medicare HMO | Source: Ambulatory Visit | Attending: Orthopedic Surgery | Admitting: Orthopedic Surgery

## 2017-05-17 ENCOUNTER — Other Ambulatory Visit: Payer: Self-pay

## 2017-05-17 DIAGNOSIS — Z01812 Encounter for preprocedural laboratory examination: Secondary | ICD-10-CM | POA: Diagnosis present

## 2017-05-17 DIAGNOSIS — I10 Essential (primary) hypertension: Secondary | ICD-10-CM | POA: Diagnosis not present

## 2017-05-17 DIAGNOSIS — Z01810 Encounter for preprocedural cardiovascular examination: Secondary | ICD-10-CM | POA: Insufficient documentation

## 2017-05-17 HISTORY — DX: Anxiety disorder, unspecified: F41.9

## 2017-05-17 HISTORY — DX: Malignant (primary) neoplasm, unspecified: C80.1

## 2017-05-17 LAB — CBC
HCT: 38.6 % (ref 35.0–47.0)
Hemoglobin: 13 g/dL (ref 12.0–16.0)
MCH: 30.1 pg (ref 26.0–34.0)
MCHC: 33.7 g/dL (ref 32.0–36.0)
MCV: 89.3 fL (ref 80.0–100.0)
PLATELETS: 139 10*3/uL — AB (ref 150–440)
RBC: 4.32 MIL/uL (ref 3.80–5.20)
RDW: 15.4 % — AB (ref 11.5–14.5)
WBC: 4.8 10*3/uL (ref 3.6–11.0)

## 2017-05-17 LAB — TYPE AND SCREEN
ABO/RH(D): A NEG
ANTIBODY SCREEN: NEGATIVE

## 2017-05-17 LAB — BASIC METABOLIC PANEL
Anion gap: 13 (ref 5–15)
BUN: 16 mg/dL (ref 6–20)
CALCIUM: 9.9 mg/dL (ref 8.9–10.3)
CO2: 22 mmol/L (ref 22–32)
CREATININE: 0.84 mg/dL (ref 0.44–1.00)
Chloride: 102 mmol/L (ref 101–111)
GFR calc Af Amer: >60 mL/min (ref >60)
GLUCOSE: 100 mg/dL — AB (ref 65–99)
Potassium: 3.1 mmol/L — ABNORMAL LOW (ref 3.5–5.1)
SODIUM: 137 mmol/L (ref 135–145)

## 2017-05-17 LAB — SURGICAL PCR SCREEN
MRSA, PCR: NEGATIVE
Staphylococcus aureus: NEGATIVE

## 2017-05-17 LAB — URINALYSIS, ROUTINE W REFLEX MICROSCOPIC
Bilirubin Urine: NEGATIVE
GLUCOSE, UA: NEGATIVE mg/dL
HGB URINE DIPSTICK: NEGATIVE
Ketones, ur: NEGATIVE mg/dL
Leukocytes, UA: NEGATIVE
Nitrite: NEGATIVE
PH: 5 (ref 5.0–8.0)
Protein, ur: NEGATIVE mg/dL
SPECIFIC GRAVITY, URINE: 1.016 (ref 1.005–1.030)

## 2017-05-17 LAB — PROTIME-INR
INR: 1.04
Prothrombin Time: 13.5 seconds (ref 11.4–15.2)

## 2017-05-17 LAB — APTT: aPTT: 28 seconds (ref 24–36)

## 2017-05-17 LAB — SEDIMENTATION RATE: SED RATE: 2 mm/h (ref 0–30)

## 2017-05-17 NOTE — Patient Instructions (Signed)
Your procedure is scheduled on: Tuesday 05/30/17 Report to [place]. To find out your arrival time please call [phone] between 1PM - 3PM on Monday 05/29/17.  Remember: Instructions that are not followed completely may result in serious medical risk, up to and including death, or upon the discretion of your surgeon and anesthesiologist your surgery may need to be rescheduled.     _X__ 1. Do not eat food after midnight the night before your procedure.                 No gum chewing or hard candies. You may drink clear liquids up to 2 hours                 before you are scheduled to arrive for your surgery- DO not drink clear                 liquids within 2 hours of the start of your surgery.                 Clear Liquids include:  water, apple juice without pulp, clear carbohydrate                 drink such as Clearfast or Gatorade, Black Coffee or Tea (Do not add                 anything to coffee or tea).  __X__2.  On the morning of surgery brush your teeth with toothpaste and water, you                 may rinse your mouth with mouthwash if you wish.  Do not swallow any              toothpaste of mouthwash.     _X__ 3.  No Alcohol for 24 hours before or after surgery.   _X__ 4.  Do Not Smoke or use e-cigarettes For 24 Hours Prior to Your Surgery.                 Do not use any chewable tobacco products for at least 6 hours prior to                 surgery.  ____  5.  Bring all medications with you on the day of surgery if instructed.   __X__  6.  Notify your doctor if there is any change in your medical condition      (cold, fever, infections).     Do not wear jewelry, make-up, hairpins, clips or nail polish. Do not wear lotions, powders, or perfumes.  Do not shave 48 hours prior to surgery. Men may shave face and neck. Do not bring valuables to the hospital.    [facility] is not responsible for any belongings or  valuables.  Contacts, dentures/partials or body piercings may not be worn into surgery. Bring a case for your contacts, glasses or hearing aids, a denture cup will be supplied. Leave your suitcase in the car. After surgery it may be brought to your room. For patients admitted to the hospital, discharge time is determined by your treatment team.   Patients discharged the day of surgery will not be allowed to drive home.   Please read over the following fact sheets that you were given:   MRSA Information  __X__ Take these medicines the morning of surgery with A SIP OF WATER:  1. BUSPIRONE IF NEEDED  2. OMEPRAZOLE  3.   4.  5.  6.  ____ Fleet Enema (as directed)   __X__ Use CHG Soap/SAGE wipes as directed  ____ Use inhalers on the day of surgery  ____ Stop metformin/Janumet/Farxiga 2 days prior to surgery    ____ Take 1/2 of usual insulin dose the night before surgery. No insulin the morning          of surgery.   ____ Stop Blood Thinners Coumadin/Plavix/Xarelto/Pleta/Pradaxa/Eliquis/Effient/Aspirin  on   Or contact your Surgeon, Cardiologist or Medical Doctor regarding  ability to stop your blood thinners  __X__ Stop Anti-inflammatories 7 days before surgery such as Advil, Ibuprofen, Motrin,  BC or Goodies Powder, Naprosyn, Naproxen, Aleve, Aspirin    __X__ Stopall herbal supplements, fish oil or vitamin E until after surgery.    ____ Bring C-Pap to the hospital.

## 2017-05-18 LAB — URINE CULTURE: CULTURE: NO GROWTH

## 2017-05-29 MED ORDER — TRANEXAMIC ACID 1000 MG/10ML IV SOLN
1000.0000 mg | INTRAVENOUS | Status: DC
  Filled 2017-05-29: qty 10

## 2017-05-29 MED ORDER — CLINDAMYCIN PHOSPHATE 900 MG/50ML IV SOLN
900.0000 mg | Freq: Once | INTRAVENOUS | Status: AC
  Administered 2017-05-30: 900 mg via INTRAVENOUS

## 2017-05-30 ENCOUNTER — Inpatient Hospital Stay
Admission: RE | Admit: 2017-05-30 | Discharge: 2017-06-01 | DRG: 470 | Disposition: A | Discharge status: Home / Self Care | Payer: Medicare HMO | Source: Ambulatory Visit | Attending: Orthopedic Surgery | Admitting: Orthopedic Surgery

## 2017-05-30 ENCOUNTER — Encounter: Payer: Self-pay | Admitting: *Deleted

## 2017-05-30 ENCOUNTER — Inpatient Hospital Stay: Payer: Medicare HMO | Admitting: Certified Registered Nurse Anesthetist

## 2017-05-30 ENCOUNTER — Inpatient Hospital Stay: Payer: Medicare HMO

## 2017-05-30 ENCOUNTER — Other Ambulatory Visit: Payer: Self-pay

## 2017-05-30 ENCOUNTER — Encounter
Admission: RE | Disposition: A | Discharge status: Home / Self Care | Payer: Self-pay | Source: Ambulatory Visit | Attending: Orthopedic Surgery

## 2017-05-30 DIAGNOSIS — Z880 Allergy status to penicillin: Secondary | ICD-10-CM

## 2017-05-30 DIAGNOSIS — Z91048 Other nonmedicinal substance allergy status: Secondary | ICD-10-CM

## 2017-05-30 DIAGNOSIS — E785 Hyperlipidemia, unspecified: Secondary | ICD-10-CM | POA: Diagnosis present

## 2017-05-30 DIAGNOSIS — Z972 Presence of dental prosthetic device (complete) (partial): Secondary | ICD-10-CM | POA: Diagnosis not present

## 2017-05-30 DIAGNOSIS — Z791 Long term (current) use of non-steroidal anti-inflammatories (NSAID): Secondary | ICD-10-CM

## 2017-05-30 DIAGNOSIS — Z90710 Acquired absence of both cervix and uterus: Secondary | ICD-10-CM | POA: Diagnosis not present

## 2017-05-30 DIAGNOSIS — I10 Essential (primary) hypertension: Secondary | ICD-10-CM | POA: Diagnosis present

## 2017-05-30 DIAGNOSIS — Z471 Aftercare following joint replacement surgery: Secondary | ICD-10-CM | POA: Diagnosis not present

## 2017-05-30 DIAGNOSIS — Z803 Family history of malignant neoplasm of breast: Secondary | ICD-10-CM | POA: Diagnosis not present

## 2017-05-30 DIAGNOSIS — F419 Anxiety disorder, unspecified: Secondary | ICD-10-CM | POA: Diagnosis present

## 2017-05-30 DIAGNOSIS — Z8249 Family history of ischemic heart disease and other diseases of the circulatory system: Secondary | ICD-10-CM | POA: Diagnosis not present

## 2017-05-30 DIAGNOSIS — E669 Obesity, unspecified: Secondary | ICD-10-CM | POA: Diagnosis present

## 2017-05-30 DIAGNOSIS — Z96651 Presence of right artificial knee joint: Secondary | ICD-10-CM | POA: Diagnosis not present

## 2017-05-30 DIAGNOSIS — Z86010 Personal history of colonic polyps: Secondary | ICD-10-CM

## 2017-05-30 DIAGNOSIS — K219 Gastro-esophageal reflux disease without esophagitis: Secondary | ICD-10-CM | POA: Diagnosis present

## 2017-05-30 DIAGNOSIS — E7849 Other hyperlipidemia: Secondary | ICD-10-CM | POA: Diagnosis not present

## 2017-05-30 DIAGNOSIS — Z6833 Body mass index (BMI) 33.0-33.9, adult: Secondary | ICD-10-CM | POA: Diagnosis not present

## 2017-05-30 DIAGNOSIS — Z79899 Other long term (current) drug therapy: Secondary | ICD-10-CM | POA: Diagnosis not present

## 2017-05-30 DIAGNOSIS — R69 Illness, unspecified: Secondary | ICD-10-CM | POA: Diagnosis not present

## 2017-05-30 DIAGNOSIS — M1711 Unilateral primary osteoarthritis, right knee: Secondary | ICD-10-CM | POA: Diagnosis not present

## 2017-05-30 DIAGNOSIS — F329 Major depressive disorder, single episode, unspecified: Secondary | ICD-10-CM | POA: Diagnosis present

## 2017-05-30 DIAGNOSIS — E039 Hypothyroidism, unspecified: Secondary | ICD-10-CM | POA: Diagnosis not present

## 2017-05-30 DIAGNOSIS — Z8711 Personal history of peptic ulcer disease: Secondary | ICD-10-CM

## 2017-05-30 DIAGNOSIS — R9431 Abnormal electrocardiogram [ECG] [EKG]: Secondary | ICD-10-CM | POA: Diagnosis not present

## 2017-05-30 DIAGNOSIS — Z811 Family history of alcohol abuse and dependence: Secondary | ICD-10-CM

## 2017-05-30 DIAGNOSIS — Z9013 Acquired absence of bilateral breasts and nipples: Secondary | ICD-10-CM

## 2017-05-30 DIAGNOSIS — G8918 Other acute postprocedural pain: Secondary | ICD-10-CM

## 2017-05-30 HISTORY — PX: TOTAL KNEE ARTHROPLASTY: SHX125

## 2017-05-30 LAB — POCT I-STAT 4, (NA,K, GLUC, HGB,HCT)
GLUCOSE: 117 mg/dL — AB (ref 65–99)
HEMATOCRIT: 39 % (ref 36.0–46.0)
Hemoglobin: 13.3 g/dL (ref 12.0–15.0)
Potassium: 3.1 mmol/L — ABNORMAL LOW (ref 3.5–5.1)
Sodium: 140 mmol/L (ref 135–145)

## 2017-05-30 LAB — ABO/RH: ABO/RH(D): A NEG

## 2017-05-30 LAB — CREATININE, SERUM
Creatinine, Ser: 1.02 mg/dL — ABNORMAL HIGH (ref 0.44–1.00)
GFR, EST AFRICAN AMERICAN: 58 mL/min — AB (ref >60)
GFR, EST NON AFRICAN AMERICAN: 50 mL/min — AB (ref >60)

## 2017-05-30 LAB — CBC
HCT: 36.3 % (ref 35.0–47.0)
Hemoglobin: 12.4 g/dL (ref 12.0–16.0)
MCH: 30.2 pg (ref 26.0–34.0)
MCHC: 34.1 g/dL (ref 32.0–36.0)
MCV: 88.5 fL (ref 80.0–100.0)
PLATELETS: 131 10*3/uL — AB (ref 150–440)
RBC: 4.1 MIL/uL (ref 3.80–5.20)
RDW: 14.7 % — ABNORMAL HIGH (ref 11.5–14.5)
WBC: 4.9 10*3/uL (ref 3.6–11.0)

## 2017-05-30 SURGERY — ARTHROPLASTY, KNEE, TOTAL
Anesthesia: Spinal | Laterality: Right

## 2017-05-30 MED ORDER — PROPOFOL 10 MG/ML IV BOLUS
INTRAVENOUS | Status: AC
  Filled 2017-05-30: qty 20

## 2017-05-30 MED ORDER — ONDANSETRON HCL 4 MG/2ML IJ SOLN
4.0000 mg | Freq: Four times a day (QID) | INTRAMUSCULAR | Status: DC | PRN
  Administered 2017-05-30 – 2017-06-01 (×3): 4 mg via INTRAVENOUS
  Filled 2017-05-30 (×3): qty 2

## 2017-05-30 MED ORDER — MIDAZOLAM HCL 5 MG/5ML IJ SOLN
INTRAMUSCULAR | Status: DC | PRN
  Administered 2017-05-30 (×2): 1 mg via INTRAVENOUS

## 2017-05-30 MED ORDER — PHENOL 1.4 % MT LIQD: 1.0000 | OROMUCOSAL | Status: DC | PRN

## 2017-05-30 MED ORDER — PROPOFOL 500 MG/50ML IV EMUL
INTRAVENOUS | Status: AC
  Filled 2017-05-30: qty 50

## 2017-05-30 MED ORDER — LIDOCAINE HCL (PF) 2 % IJ SOLN
INTRAMUSCULAR | Status: AC
Start: 2017-05-30 — End: ?
  Filled 2017-05-30: qty 10

## 2017-05-30 MED ORDER — MIDAZOLAM HCL 2 MG/2ML IJ SOLN
INTRAMUSCULAR | Status: AC
  Filled 2017-05-30: qty 2

## 2017-05-30 MED ORDER — BUPIVACAINE HCL (PF) 0.5 % IJ SOLN
INTRAMUSCULAR | Status: AC
  Filled 2017-05-30: qty 10

## 2017-05-30 MED ORDER — MAGNESIUM CITRATE PO SOLN: 1.0000 | Freq: Once | ORAL | Status: DC | PRN

## 2017-05-30 MED ORDER — BUPIVACAINE-EPINEPHRINE (PF) 0.25% -1:200000 IJ SOLN
INTRAMUSCULAR | Status: DC | PRN
  Administered 2017-05-30: 30 mL via PERINEURAL

## 2017-05-30 MED ORDER — PROPOFOL 500 MG/50ML IV EMUL
INTRAVENOUS | Status: DC | PRN
  Administered 2017-05-30: 25 ug/kg/min via INTRAVENOUS

## 2017-05-30 MED ORDER — MORPHINE SULFATE 10 MG/ML IJ SOLN
INTRAMUSCULAR | Status: DC | PRN
  Administered 2017-05-30: 10 mg via SUBCUTANEOUS

## 2017-05-30 MED ORDER — BISACODYL 10 MG RE SUPP
10.0000 mg | Freq: Every day | RECTAL | Status: DC | PRN
  Administered 2017-06-01: 10 mg via RECTAL
  Filled 2017-05-30: qty 1

## 2017-05-30 MED ORDER — CLINDAMYCIN PHOSPHATE 900 MG/50ML IV SOLN
900.0000 mg | Freq: Four times a day (QID) | INTRAVENOUS | Status: AC
  Administered 2017-05-30 – 2017-05-31 (×4): 900 mg via INTRAVENOUS
  Filled 2017-05-30 (×4): qty 50

## 2017-05-30 MED ORDER — RISAQUAD PO CAPS
1.0000 | ORAL_CAPSULE | Freq: Every day | ORAL | Status: DC | PRN
  Filled 2017-05-30: qty 1

## 2017-05-30 MED ORDER — ACETAMINOPHEN 325 MG PO TABS: 325.0000 mg | ORAL_TABLET | Freq: Four times a day (QID) | ORAL | Status: DC | PRN

## 2017-05-30 MED ORDER — BUPIVACAINE-EPINEPHRINE (PF) 0.25% -1:200000 IJ SOLN
INTRAMUSCULAR | Status: AC
Start: 2017-05-30 — End: ?
  Filled 2017-05-30: qty 30

## 2017-05-30 MED ORDER — NEOMYCIN-POLYMYXIN B GU 40-200000 IR SOLN
Status: DC | PRN
  Administered 2017-05-30: 16 mL

## 2017-05-30 MED ORDER — ACETAMINOPHEN 500 MG PO TABS
500.0000 mg | ORAL_TABLET | Freq: Four times a day (QID) | ORAL | Status: DC
  Administered 2017-05-31: 500 mg via ORAL
  Filled 2017-05-30: qty 1

## 2017-05-30 MED ORDER — FENTANYL CITRATE (PF) 100 MCG/2ML IJ SOLN
INTRAMUSCULAR | Status: DC | PRN
  Administered 2017-05-30 (×2): 50 ug via INTRAVENOUS

## 2017-05-30 MED ORDER — BUPIVACAINE LIPOSOME 1.3 % IJ SUSP
INTRAMUSCULAR | Status: DC | PRN
  Administered 2017-05-30: 60 mL

## 2017-05-30 MED ORDER — OXYCODONE HCL 5 MG/5ML PO SOLN: 5.0000 mg | Freq: Once | ORAL | Status: DC | PRN

## 2017-05-30 MED ORDER — METOCLOPRAMIDE HCL 5 MG/ML IJ SOLN: 5.0000 mg | Freq: Three times a day (TID) | INTRAMUSCULAR | Status: DC | PRN

## 2017-05-30 MED ORDER — PANTOPRAZOLE SODIUM 40 MG PO TBEC
40.0000 mg | DELAYED_RELEASE_TABLET | Freq: Every day | ORAL | Status: DC
  Administered 2017-06-01: 40 mg via ORAL
  Filled 2017-05-30: qty 1

## 2017-05-30 MED ORDER — CHLORTHALIDONE 25 MG PO TABS
25.0000 mg | ORAL_TABLET | Freq: Every day | ORAL | Status: DC
  Administered 2017-06-01: 25 mg via ORAL
  Filled 2017-05-30 (×3): qty 1

## 2017-05-30 MED ORDER — CLINDAMYCIN PHOSPHATE 900 MG/50ML IV SOLN
INTRAVENOUS | Status: AC
  Filled 2017-05-30: qty 50

## 2017-05-30 MED ORDER — METOCLOPRAMIDE HCL 10 MG PO TABS: 5.0000 mg | ORAL_TABLET | Freq: Three times a day (TID) | ORAL | Status: DC | PRN

## 2017-05-30 MED ORDER — EPHEDRINE SULFATE 50 MG/ML IJ SOLN
INTRAMUSCULAR | Status: DC | PRN
  Administered 2017-05-30: 10 mg via INTRAVENOUS

## 2017-05-30 MED ORDER — MORPHINE SULFATE (PF) 10 MG/ML IV SOLN
INTRAVENOUS | Status: AC
  Filled 2017-05-30: qty 1

## 2017-05-30 MED ORDER — MAGNESIUM HYDROXIDE 400 MG/5ML PO SUSP
30.0000 mL | Freq: Every day | ORAL | Status: DC | PRN
  Administered 2017-06-01: 30 mL via ORAL
  Filled 2017-05-30: qty 30

## 2017-05-30 MED ORDER — BUPIVACAINE HCL (PF) 0.5 % IJ SOLN
INTRAMUSCULAR | Status: DC | PRN
  Administered 2017-05-30: 3 mL

## 2017-05-30 MED ORDER — ONDANSETRON HCL 4 MG PO TABS: 4.0000 mg | ORAL_TABLET | Freq: Four times a day (QID) | ORAL | Status: DC | PRN

## 2017-05-30 MED ORDER — SODIUM CHLORIDE 0.9 % IJ SOLN
INTRAMUSCULAR | Status: AC
  Filled 2017-05-30: qty 100

## 2017-05-30 MED ORDER — BUSPIRONE HCL 5 MG PO TABS
5.0000 mg | ORAL_TABLET | Freq: Every day | ORAL | Status: DC | PRN
  Filled 2017-05-30: qty 1

## 2017-05-30 MED ORDER — PHENYLEPHRINE HCL 10 MG/ML IJ SOLN
INTRAMUSCULAR | Status: AC
Start: 2017-05-30 — End: ?
  Filled 2017-05-30: qty 1

## 2017-05-30 MED ORDER — TRAMADOL HCL 50 MG PO TABS
50.0000 mg | ORAL_TABLET | Freq: Four times a day (QID) | ORAL | Status: DC
  Administered 2017-05-31: 50 mg via ORAL
  Filled 2017-05-30 (×3): qty 1

## 2017-05-30 MED ORDER — OXYCODONE HCL 5 MG PO TABS: 5.0000 mg | ORAL_TABLET | Freq: Once | ORAL | Status: DC | PRN

## 2017-05-30 MED ORDER — MENTHOL 3 MG MT LOZG: 1.0000 | LOZENGE | OROMUCOSAL | Status: DC | PRN

## 2017-05-30 MED ORDER — LIDOCAINE HCL (CARDIAC) 20 MG/ML IV SOLN
INTRAVENOUS | Status: DC | PRN
  Administered 2017-05-30: 60 mg via INTRAVENOUS

## 2017-05-30 MED ORDER — ZOLPIDEM TARTRATE 5 MG PO TABS: 5.0000 mg | ORAL_TABLET | Freq: Every evening | ORAL | Status: DC | PRN

## 2017-05-30 MED ORDER — NEOMYCIN-POLYMYXIN B GU 40-200000 IR SOLN
Status: AC
  Filled 2017-05-30: qty 40

## 2017-05-30 MED ORDER — SODIUM CHLORIDE 0.9 % IJ SOLN
INTRAMUSCULAR | Status: DC | PRN
  Administered 2017-05-30: 60 mL via INTRAVENOUS

## 2017-05-30 MED ORDER — SODIUM CHLORIDE 0.9 % IJ SOLN
INTRAMUSCULAR | Status: AC
  Filled 2017-05-30: qty 10

## 2017-05-30 MED ORDER — DOCUSATE SODIUM 100 MG PO CAPS
100.0000 mg | ORAL_CAPSULE | Freq: Two times a day (BID) | ORAL | Status: DC
  Administered 2017-05-31 – 2017-06-01 (×3): 100 mg via ORAL
  Filled 2017-05-30 (×4): qty 1

## 2017-05-30 MED ORDER — BUPIVACAINE LIPOSOME 1.3 % IJ SUSP
INTRAMUSCULAR | Status: AC
  Filled 2017-05-30: qty 20

## 2017-05-30 MED ORDER — LACTATED RINGERS IV SOLN
INTRAVENOUS | Status: DC
  Administered 2017-05-30 (×2): via INTRAVENOUS

## 2017-05-30 MED ORDER — KETOROLAC TROMETHAMINE 30 MG/ML IJ SOLN
INTRAMUSCULAR | Status: DC | PRN
  Administered 2017-05-30: 30 mg via INTRAVENOUS

## 2017-05-30 MED ORDER — DIPHENHYDRAMINE HCL 12.5 MG/5ML PO ELIX: 12.5000 mg | ORAL_SOLUTION | ORAL | Status: DC | PRN

## 2017-05-30 MED ORDER — EPHEDRINE SULFATE 50 MG/ML IJ SOLN
INTRAMUSCULAR | Status: AC
  Filled 2017-05-30: qty 1

## 2017-05-30 MED ORDER — MORPHINE SULFATE (PF) 2 MG/ML IV SOLN: 0.5000 mg | INTRAVENOUS | Status: DC | PRN

## 2017-05-30 MED ORDER — ENOXAPARIN SODIUM 30 MG/0.3ML ~~LOC~~ SOLN
30.0000 mg | Freq: Two times a day (BID) | SUBCUTANEOUS | Status: DC
  Administered 2017-05-31 – 2017-06-01 (×3): 30 mg via SUBCUTANEOUS
  Filled 2017-05-30 (×3): qty 0.3

## 2017-05-30 MED ORDER — MEPERIDINE HCL 50 MG/ML IJ SOLN: 6.2500 mg | INTRAMUSCULAR | Status: DC | PRN

## 2017-05-30 MED ORDER — PROPOFOL 10 MG/ML IV BOLUS
INTRAVENOUS | Status: DC | PRN
  Administered 2017-05-30: 20 mg via INTRAVENOUS

## 2017-05-30 MED ORDER — FENTANYL CITRATE (PF) 100 MCG/2ML IJ SOLN: 25.0000 ug | INTRAMUSCULAR | Status: DC | PRN

## 2017-05-30 MED ORDER — SODIUM CHLORIDE 0.9 % IV SOLN
INTRAVENOUS | Status: DC
  Administered 2017-05-30 – 2017-05-31 (×2): via INTRAVENOUS

## 2017-05-30 MED ORDER — SODIUM CHLORIDE 0.9 % IV SOLN
INTRAVENOUS | Status: DC | PRN
  Administered 2017-05-30: 1000 mg via INTRAVENOUS

## 2017-05-30 MED ORDER — HYDROCODONE-ACETAMINOPHEN 7.5-325 MG PO TABS: 1.0000 | ORAL_TABLET | ORAL | Status: DC | PRN

## 2017-05-30 MED ORDER — FENTANYL CITRATE (PF) 100 MCG/2ML IJ SOLN
INTRAMUSCULAR | Status: AC
  Filled 2017-05-30: qty 2

## 2017-05-30 MED ORDER — GABAPENTIN 300 MG PO CAPS
300.0000 mg | ORAL_CAPSULE | Freq: Three times a day (TID) | ORAL | Status: DC
  Administered 2017-05-30 – 2017-05-31 (×3): 300 mg via ORAL
  Filled 2017-05-30 (×4): qty 1

## 2017-05-30 MED ORDER — PROMETHAZINE HCL 25 MG/ML IJ SOLN: 6.2500 mg | INTRAMUSCULAR | Status: DC | PRN

## 2017-05-30 MED ORDER — TRIAMCINOLONE ACETONIDE 55 MCG/ACT NA AERO
2.0000 | INHALATION_SPRAY | Freq: Every day | NASAL | Status: DC | PRN
Start: 2017-05-30 — End: 2017-06-01

## 2017-05-30 MED ORDER — METHOCARBAMOL 500 MG PO TABS
500.0000 mg | ORAL_TABLET | Freq: Four times a day (QID) | ORAL | Status: DC | PRN
  Filled 2017-05-30: qty 1

## 2017-05-30 MED ORDER — HYDROCODONE-ACETAMINOPHEN 5-325 MG PO TABS
1.0000 | ORAL_TABLET | ORAL | Status: DC | PRN
  Administered 2017-05-30 – 2017-06-01 (×6): 1 via ORAL
  Administered 2017-06-01 (×3): 2 via ORAL
  Filled 2017-05-30 (×2): qty 2
  Filled 2017-05-30: qty 1
  Filled 2017-05-30: qty 2
  Filled 2017-05-30 (×5): qty 1

## 2017-05-30 MED ORDER — METHOCARBAMOL 1000 MG/10ML IJ SOLN
500.0000 mg | Freq: Four times a day (QID) | INTRAVENOUS | Status: DC | PRN
  Filled 2017-05-30: qty 5

## 2017-05-30 SURGICAL SUPPLY — 68 items
BANDAGE ACE 6X5 VEL STRL LF (GAUZE/BANDAGES/DRESSINGS) ×2 IMPLANT
BLADE SAW 1 (BLADE) ×2 IMPLANT
BLOCK CUTTING FEMUR 3+ RT MED (MISCELLANEOUS) ×1 IMPLANT
BLOCK CUTTING TIBIAL 3 RT (MISCELLANEOUS) ×1 IMPLANT
BLOCK CUTTING TIBIAL 4 RT MIS (MISCELLANEOUS) ×1 IMPLANT
CANISTER SUCT 1200ML W/VALVE (MISCELLANEOUS) ×2 IMPLANT
CANISTER SUCT 3000ML PPV (MISCELLANEOUS) ×4 IMPLANT
CEMENT HV SMART SET (Cement) ×4 IMPLANT
CHLORAPREP W/TINT 26ML (MISCELLANEOUS) ×4 IMPLANT
COOLER POLAR GLACIER W/PUMP (MISCELLANEOUS) ×2 IMPLANT
CUFF TOURN 24 STER (MISCELLANEOUS) IMPLANT
CUFF TOURN 30 STER DUAL PORT (MISCELLANEOUS) ×1 IMPLANT
DRAPE SHEET LG 3/4 BI-LAMINATE (DRAPES) ×4 IMPLANT
ELECT CAUTERY BLADE 6.4 (BLADE) ×2 IMPLANT
ELECT REM PT RETURN 9FT ADLT (ELECTROSURGICAL) ×2
ELECTRODE REM PT RTRN 9FT ADLT (ELECTROSURGICAL) ×1 IMPLANT
FEMORAL COMP SZ3P RT SPHERE (Femur) ×1 IMPLANT
FEMUR BONE MODEL (Bone Implant) ×1 IMPLANT
FEMUR CUTTING BLOCK IMPLANT
GAUZE PETRO XEROFOAM 1X8 (MISCELLANEOUS) ×2 IMPLANT
GAUZE SPONGE 4X4 12PLY STRL (GAUZE/BANDAGES/DRESSINGS) ×2 IMPLANT
GLOVE BIOGEL PI IND STRL 9 (GLOVE) ×1 IMPLANT
GLOVE BIOGEL PI INDICATOR 9 (GLOVE) ×1
GLOVE INDICATOR 8.0 STRL GRN (GLOVE) ×5 IMPLANT
GLOVE SURG ORTHO 8.0 STRL STRW (GLOVE) ×5 IMPLANT
GLOVE SURG SYN 9.0  PF PI (GLOVE) ×1
GLOVE SURG SYN 9.0 PF PI (GLOVE) ×1 IMPLANT
GOWN SRG 2XL LVL 4 RGLN SLV (GOWNS) ×1 IMPLANT
GOWN STRL NON-REIN 2XL LVL4 (GOWNS) ×2
GOWN STRL REUS W/ TWL LRG LVL3 (GOWN DISPOSABLE) ×1 IMPLANT
GOWN STRL REUS W/ TWL XL LVL3 (GOWN DISPOSABLE) ×1 IMPLANT
GOWN STRL REUS W/TWL LRG LVL3 (GOWN DISPOSABLE) ×2
GOWN STRL REUS W/TWL XL LVL3 (GOWN DISPOSABLE) ×2
HOLDER FOLEY CATH W/STRAP (MISCELLANEOUS) ×2 IMPLANT
HOOD PEEL AWAY FLYTE STAYCOOL (MISCELLANEOUS) ×4 IMPLANT
IMMBOLIZER KNEE 19 BLUE UNIV (SOFTGOODS) ×2 IMPLANT
INSERT TIBIAL SZ3 RT 13 FLEX (Insert) ×1 IMPLANT
KIT TURNOVER KIT A (KITS) ×2 IMPLANT
KNIFE SCULPS 14X20 (INSTRUMENTS) ×2 IMPLANT
NDL SAFETY ECLIPSE 18X1.5 (NEEDLE) ×1 IMPLANT
NDL SPNL 18GX3.5 QUINCKE PK (NEEDLE) ×1 IMPLANT
NDL SPNL 20GX3.5 QUINCKE YW (NEEDLE) ×1 IMPLANT
NEEDLE HYPO 18GX1.5 SHARP (NEEDLE) ×2
NEEDLE SPNL 18GX3.5 QUINCKE PK (NEEDLE) ×2 IMPLANT
NEEDLE SPNL 20GX3.5 QUINCKE YW (NEEDLE) ×2 IMPLANT
NS IRRIG 1000ML POUR BTL (IV SOLUTION) ×2 IMPLANT
PACK TOTAL KNEE (MISCELLANEOUS) ×2 IMPLANT
PAD WRAPON POLAR KNEE (MISCELLANEOUS) ×1 IMPLANT
PATELLA RESURFACING MEDACTA 02 (Bone Implant) ×1 IMPLANT
PULSAVAC PLUS IRRIG FAN TIP (DISPOSABLE) ×2
SOL .9 NS 3000ML IRR  AL (IV SOLUTION) ×1
SOL .9 NS 3000ML IRR AL (IV SOLUTION) ×1
SOL .9 NS 3000ML IRR UROMATIC (IV SOLUTION) ×1 IMPLANT
STAPLER SKIN PROX 35W (STAPLE) ×2 IMPLANT
STEM EXTENSION 11MMX30MM (Stem) ×1 IMPLANT
SUCTION FRAZIER HANDLE 10FR (MISCELLANEOUS) ×1
SUCTION TUBE FRAZIER 10FR DISP (MISCELLANEOUS) ×1 IMPLANT
SUT DVC 2 QUILL PDO  T11 36X36 (SUTURE) ×1
SUT DVC 2 QUILL PDO T11 36X36 (SUTURE) ×1 IMPLANT
SUT V-LOC 90 ABS DVC 3-0 CL (SUTURE) ×2 IMPLANT
SYR 20CC LL (SYRINGE) ×2 IMPLANT
SYR 50ML LL SCALE MARK (SYRINGE) ×4 IMPLANT
TIP FAN IRRIG PULSAVAC PLUS (DISPOSABLE) ×1 IMPLANT
TOWEL OR 17X26 4PK STRL BLUE (TOWEL DISPOSABLE) ×2 IMPLANT
TOWER CARTRIDGE SMART MIX (DISPOSABLE) ×2 IMPLANT
TRAY FOLEY W/METER SILVER 16FR (SET/KITS/TRAYS/PACK) ×2 IMPLANT
TRAY TIB FX RT SZ 3 (Joint) ×1 IMPLANT
WRAPON POLAR PAD KNEE (MISCELLANEOUS) ×2

## 2017-05-30 NOTE — Progress Notes (Signed)
Patient was admitted to room 159 from OR. A&O x4. Foley, drain, TEDs, and foot pumps in place. Family at bedside. IV fluids started. Oriented to room, call light, TV and bed controls. Bed alarm on for safety.

## 2017-05-30 NOTE — NC FL2 (Signed)
[place] LEVEL OF CARE SCREENING TOOL     IDENTIFICATION  Patient Name: Kaitlyn Oliver Birthdate: [birth year] Sex: female Admission Date (Current Location): 05/30/2017  [place] and [place] Number:  Engineering geologist and Address:  [facility], [address] [place], [place] [address]      Provider Number: [id number]  Attending Physician Name and Address:  Hessie Knows, MD  Relative Name and Phone Number:       Current Level of Care: Hospital Recommended Level of Care: [place] Prior Approval Number:    Date Approved/Denied:   PASRR Number: [id number])  Discharge Plan: SNF    Current Diagnoses: Patient Active Problem List   Diagnosis Date Noted  . Status post total knee replacement using cement, right 05/30/2017  . Gastroesophageal reflux disease 12/22/2015  . Adenomatous polyp 09/11/2014  . Bilateral cataracts 09/11/2014  . Gastric catarrh 09/11/2014  . Gastroduodenal ulcer 09/11/2014  . Hypothyroidism 09/11/2014  . Cardiac murmur 10/08/2013  . Essential (primary) hypertension 10/08/2013  . Combined fat and carbohydrate induced hyperlipemia 10/08/2013    Orientation RESPIRATION BLADDER Height & Weight     Self, Time, Situation, Place  Normal Continent Weight: 203 lb (92.1 kg) Height:  5\' 5"  (165.1 cm)  BEHAVIORAL SYMPTOMS/MOOD NEUROLOGICAL BOWEL NUTRITION STATUS      Continent Diet(Regular)  AMBULATORY STATUS COMMUNICATION OF NEEDS Skin   Extensive Assist Verbally Surgical wounds(Incision Right Knee)                       Personal Care Assistance Level of Assistance  Bathing, Feeding, Dressing Bathing Assistance: Limited assistance Feeding assistance: Independent Dressing Assistance: Limited assistance     Functional Limitations Info  Sight, Hearing, Speech Sight Info: Adequate Hearing Info: Adequate Speech Info: Adequate    SPECIAL CARE FACTORS FREQUENCY  PT (By licensed PT),  OT (By licensed OT)     PT Frequency: (5) OT Frequency: (5)            Contractures      Additional Factors Info  Code Status, Allergies Code Status Info: (Full Code) Allergies Info: (ADHESIVE TAPE, PENICILLIN G )           Current Medications (05/30/2017):  This is the current hospital active medication list Current Facility-Administered Medications  Medication Dose Route Frequency Provider Last Rate Last Dose  . 0.9 %  sodium chloride infusion   Intravenous Continuous Hessie Knows, MD 100 mL/hr at 05/30/17 1129    . [START ON 05/31/2017] acetaminophen (TYLENOL) tablet 325-650 mg  325-650 mg Oral Q6H PRN Hessie Knows, MD      . acetaminophen (TYLENOL) tablet 500 mg  500 mg Oral Q6H Hessie Knows, MD      . acidophilus (RISAQUAD) capsule 1 capsule  1 capsule Oral Daily PRN Hessie Knows, MD      . bisacodyl (DULCOLAX) suppository 10 mg  10 mg Rectal Daily PRN Hessie Knows, MD      . busPIRone (BUSPAR) tablet 5 mg  5 mg Oral Daily PRN Hessie Knows, MD      . chlorthalidone (HYGROTON) tablet 25 mg  25 mg Oral Daily Hessie Knows, MD      . clindamycin (CLEOCIN) IVPB 900 mg  900 mg Intravenous Q6H Hessie Knows, MD      . diphenhydrAMINE (BENADRYL) 12.5 MG/5ML elixir 12.5-25 mg  12.5-25 mg Oral Q4H PRN Hessie Knows, MD      . docusate sodium (COLACE) capsule 100  mg  100 mg Oral BID Hessie Knows, MD      . Derrill Memo ON 05/31/2017] enoxaparin (LOVENOX) injection 30 mg  30 mg Subcutaneous Q12H Hessie Knows, MD      . gabapentin (NEURONTIN) capsule 300 mg  300 mg Oral TID Hessie Knows, MD      . HYDROcodone-acetaminophen [facility]) 7.5-325 MG per tablet 1-2 tablet  1-2 tablet Oral Q4H PRN Hessie Knows, MD      . HYDROcodone-acetaminophen (NORCO/VICODIN) 5-325 MG per tablet 1-2 tablet  1-2 tablet Oral Q4H PRN Hessie Knows, MD   1 tablet at 05/30/17 1129  . magnesium citrate solution 1 Bottle  1 Bottle Oral Once PRN Hessie Knows, MD      . magnesium hydroxide (MILK OF MAGNESIA)  suspension 30 mL  30 mL Oral Daily PRN Hessie Knows, MD      . menthol-cetylpyridinium (CEPACOL) lozenge 3 mg  1 lozenge Oral PRN Hessie Knows, MD       Or  . phenol (CHLORASEPTIC) mouth spray 1 spray  1 spray Mouth/Throat PRN Hessie Knows, MD      . methocarbamol (ROBAXIN) tablet 500 mg  500 mg Oral Q6H PRN Hessie Knows, MD       Or  . methocarbamol (ROBAXIN) 500 mg in dextrose 5 % 50 mL IVPB  500 mg Intravenous Q6H PRN Hessie Knows, MD      . metoCLOPramide (REGLAN) tablet 5-10 mg  5-10 mg Oral Q8H PRN Hessie Knows, MD       Or  . metoCLOPramide (REGLAN) injection 5-10 mg  5-10 mg Intravenous Q8H PRN Hessie Knows, MD      . morphine 2 MG/ML injection 0.5-1 mg  0.5-1 mg Intravenous Q2H PRN Hessie Knows, MD      . ondansetron [facility]) tablet 4 mg  4 mg Oral Q6H PRN Hessie Knows, MD       Or  . ondansetron [facility]) injection 4 mg  4 mg Intravenous Q6H PRN Hessie Knows, MD      . Derrill Memo ON 05/31/2017] pantoprazole (PROTONIX) EC tablet 40 mg  40 mg Oral Daily Hessie Knows, MD      . traMADol [facility]) tablet 50 mg  50 mg Oral Q6H Hessie Knows, MD      . triamcinolone (NASACORT) nasal inhaler 2 spray  2 spray Nasal Daily PRN Hessie Knows, MD      . zolpidem (AMBIEN) tablet 5 mg  5 mg Oral QHS PRN Hessie Knows, MD         Discharge Medications: Please see discharge summary for a list of discharge medications.  Relevant Imaging Results:  Relevant Lab Results:   Additional Information (SSN: [id number])  Smith Mince, Student-Social Work

## 2017-05-30 NOTE — Progress Notes (Signed)
Upon arrival to PACU  Complaint of chest pain left  Dull ache dr Rosey Bath ordered EKG

## 2017-05-30 NOTE — Progress Notes (Signed)
ekg done  No more chest pain after raising pt up and drinking ginger ale

## 2017-05-30 NOTE — H&P (Signed)
Reviewed paper H+P, will be scanned into chart. No changes noted.  

## 2017-05-30 NOTE — Transfer of Care (Signed)
Immediate Anesthesia Transfer of Care Note  Patient: Kaitlyn Oliver  Procedure(s) Performed: TOTAL KNEE ARTHROPLASTY (Right )  Patient Location: PACU  Anesthesia Type:Spinal  Level of Consciousness: awake  Airway & Oxygen Therapy: Patient Spontanous Breathing  Post-op Assessment: Report given to RN  Post vital signs: stable  Last Vitals:  Vitals:   05/30/17 0610  BP: (!) 149/81  Pulse: 64  Resp: 16  Temp: 36.6 C  SpO2: 99%    Last Pain: There were no vitals filed for this visit.       Complications: No apparent anesthesia complications

## 2017-05-30 NOTE — Evaluation (Signed)
Physical Therapy Evaluation Patient Details Name: Kaitlyn Oliver MRN: [id number] DOB: September 06, 1933 Today's Date: 05/30/2017   History of Present Illness  Kaitlyn Oliver is an 84yo white female who comes to [facility] for elective Left TKA. Pt reports bilat mastectomy 30ya, as well as Rt TKA 5ya. At baseline pt was aMB s AD, no falls.   Clinical Impression  Pt admitted with above diagnosis. Pt currently with functional limitations due to the deficits listed below (see "PT Problem List"). Upon entry, the patient is received semirecumbent in bed, family present. The pt is awake and agreeable to participate. No acute distress noted at this time. The pt is alert and oriented x3, pleasant, conversational, and following simple and multi-step commands consistently. VSS on room air. Functional mobility assessment demonstrates mild-moderate strength impairment in BLE, but patient is able to perform all mobility at MinGuard assist to supervision level, min-modA required only for limb movement during some exercises. Excellent quads activation aEB ability to perform SAQ without support. Right knee P/ROM: 13-98 degrees. Pt AMB 176ft c RW at supervision level, distance limited by prudence only. The patient is calm and confident throughout. Pt will benefit from skilled PT intervention to increase independence and safety with basic mobility in preparation for discharge to the venue listed below.       Follow Up Recommendations Home health PT    Equipment Recommendations  None recommended by PT    Recommendations for Other Services       Precautions / Restrictions Precautions Precautions: Fall;Knee Restrictions Weight Bearing Restrictions: Yes LLE Weight Bearing: Weight bearing as tolerated      Mobility  Bed Mobility Overal bed mobility: Needs Assistance Bed Mobility: Supine to Sit     Supine to sit: Supervision     General bed mobility comments: moves well, no major pain limitations.    Transfers Overall transfer level: Needs assistance Equipment used: Rolling walker (2 wheeled) Transfers: Sit to/from Stand Sit to Stand: Supervision         General transfer comment: additional time for planning and safe RW use.   Ambulation/Gait Ambulation/Gait assistance: Supervision;Min guard Ambulation Distance (Feet): 100 Feet(limited by prudence ) Assistive device: Rolling walker (2 wheeled) Gait Pattern/deviations: Step-through pattern Gait velocity: 0.23m/s  Gait velocity interpretation: <1.8 ft/sec, indicative of risk for recurrent falls General Gait Details: step throughout gait, no noted antalgia  Stairs            Wheelchair Mobility    Modified Rankin (Stroke Patients Only)       Balance Overall balance assessment: Modified Independent                                           Pertinent Vitals/Pain Pain Assessment: (0 upon arrival; 2-3/10 in Rt knee at end of session)    Home Living Family/patient expects to be discharged to:: Private residence Living Arrangements: Spouse/significant other Available Help at Discharge: Family Type of Home: House Home Access: Stairs to enter Entrance Stairs-Rails: Right Entrance Stairs-Number of Steps: 4 Home Layout: Two level;Laundry or work area in basement;Able to live on main level with bedroom/bathroom Home Equipment: Environmental consultant - 2 wheels;Cane - single point;Bedside commode;Shower seat - built in;Grab bars - tub/shower;Grab bars - toilet      Prior Function Level of Independence: Independent               Hand Dominance  Extremity/Trunk Assessment                Communication   Communication: No difficulties  Cognition Arousal/Alertness: Awake/alert Behavior During Therapy: WFL for tasks assessed/performed Overall Cognitive Status: Within Functional Limits for tasks assessed                                        General Comments       Exercises Total Joint Exercises Short Arc Quad: AROM;Right;10 reps;Supine Heel Slides: AAROM;Supine;Right;10 reps Hip ABduction/ADduction: AROM;15 reps;Supine;Right Goniometric ROM: Right knee P/ROM: 13-98 degrees   Assessment/Plan    PT Assessment Patient needs continued PT services  PT Problem List Decreased balance;Decreased activity tolerance;Decreased range of motion;Decreased strength;Decreased knowledge of precautions;Decreased mobility       PT Treatment Interventions DME instruction;Gait training;Stair training;Functional mobility training;Therapeutic activities;Therapeutic exercise;Patient/family education    PT Goals (Current goals can be found in the Care Plan section)  Acute Rehab PT Goals Patient Stated Goal: return to home and start HHPT there PT Goal Formulation: With patient Time For Goal Achievement: 06/13/17 Potential to Achieve Goals: Good    Frequency BID   Barriers to discharge Inaccessible home environment      Co-evaluation               AM-PAC PT "6 Clicks" Daily Activity  Outcome Measure Difficulty turning over in bed (including adjusting bedclothes, sheets and blankets)?: A Little Difficulty moving from lying on back to sitting on the side of the bed? : A Little Difficulty sitting down on and standing up from a chair with arms (e.g., wheelchair, bedside commode, etc,.)?: A Little Help needed moving to and from a bed to chair (including a wheelchair)?: A Little Help needed walking in hospital room?: A Little Help needed climbing 3-5 steps with a railing? : A Little 6 Click Score: 18    End of Session Equipment Utilized During Treatment: Gait belt Activity Tolerance: Patient tolerated treatment well Patient left: in chair;with call bell/phone within reach;with chair alarm set;with family/visitor present;with SCD's reapplied Nurse Communication: Mobility status PT Visit Diagnosis: Muscle weakness (generalized) (M62.81);Difficulty in  walking, not elsewhere classified (R26.2)    Time: 1610-9604 PT Time Calculation (min) (ACUTE ONLY): 41 min   Charges:   PT Evaluation $PT Eval Low Complexity: 1 Low PT Treatments $Therapeutic Exercise: 8-22 mins $Therapeutic Activity: 8-22 mins   PT G Codes:        3:34 PM, 06-01-17 Rosamaria Lints, PT, DPT Physical Therapist - [facility]  [phone] (ASCOM)     Tramaine Snell C Jun 01, 2017, 3:31 PM

## 2017-05-30 NOTE — Anesthesia Post-op Follow-up Note (Signed)
Anesthesia QCDR form completed.        

## 2017-05-30 NOTE — Anesthesia Preprocedure Evaluation (Signed)
Anesthesia Evaluation  Patient identified by MRN, date of birth, ID band Patient awake    Reviewed: Allergy & Precautions, NPO status , Patient's Chart, lab work & pertinent test results  History of Anesthesia Complications Negative for: history of anesthetic complications  Airway Mallampati: III  TM Distance: >3 FB Neck ROM: Full    Dental  (+) Upper Dentures, Partial Lower   Pulmonary neg sleep apnea, neg COPD, former smoker,    breath sounds clear to auscultation- rhonchi (-) wheezing      Cardiovascular hypertension, Pt. on medications (-) CAD, (-) Past MI, (-) Cardiac Stents and (-) CABG  Rhythm:Regular Rate:Normal - Systolic murmurs and - Diastolic murmurs    Neuro/Psych  Headaches, PSYCHIATRIC DISORDERS Anxiety Depression    GI/Hepatic Neg liver ROS, PUD, GERD  ,  Endo/Other  neg diabetesHypothyroidism   Renal/GU negative Renal ROS     Musculoskeletal  (+) Arthritis ,   Abdominal (+) + obese,   Peds  Hematology negative hematology ROS (+)   Anesthesia Other Findings Past Medical History: No date: Anxiety No date: Arthritis     Comment:  thumbs, hands 1991: Cancer ([place])     Comment:  bilateral breast No date: Depression No date: GERD (gastroesophageal reflux disease) No date: Headache     Comment:  occasional - pinched nerve in neck No date: Hypertension No date: Hypothyroidism No date: Thyroid disease No date: Wears dentures     Comment:  full upper, partial lower   Reproductive/Obstetrics                             Lab Results  Component Value Date   WBC 4.8 05/17/2017   HGB 13.3 05/30/2017   HCT 39.0 05/30/2017   MCV 89.3 05/17/2017   PLT 139 (L) 05/17/2017    Anesthesia Physical Anesthesia Plan  ASA: II  Anesthesia Plan: Spinal   Post-op Pain Management:    Induction:   PONV Risk Score and Plan: 2 and Propofol infusion  Airway Management Planned:  Natural Airway  Additional Equipment:   Intra-op Plan:   Post-operative Plan:   Informed Consent: I have reviewed the patients History and Physical, chart, labs and discussed the procedure including the risks, benefits and alternatives for the proposed anesthesia with the patient or authorized representative who has indicated his/her understanding and acceptance.   Dental advisory given  Plan Discussed with: CRNA and Anesthesiologist  Anesthesia Plan Comments:         Anesthesia Quick Evaluation

## 2017-05-30 NOTE — Op Note (Signed)
05/30/2017  9:18 AM  PATIENT:  Kaitlyn Oliver  84 y.o. female  PRE-OPERATIVE DIAGNOSIS:  primary localized osteoarthritis of right knee  POST-OPERATIVE DIAGNOSIS:  primary localized osteoarthritis of right knee  PROCEDURE:  Procedure(s): TOTAL KNEE ARTHROPLASTY (Right)  SURGEON: Laurene Footman, MD  ASSISTANTS: Rachelle Hora PA-C  ANESTHESIA:   spinal  EBL:  Total I/O In: 1000 [I.V.:1000] Out: 150 [Urine:150]  BLOOD ADMINISTERED:none  DRAINS: none   LOCAL MEDICATIONS USED:  MARCAINE    and OTHER Exparel morphine and Toradol  SPECIMEN:  No Specimen  DISPOSITION OF SPECIMEN:  N/A  COUNTS:  YES  TOURNIQUET:   Total Tourniquet Time Documented: Thigh (Right) - 66 minutes Total: Thigh (Right) - 66 minutes   IMPLANTS: Medacta GM K sphere system 3+ femur, 3 tibia with short stem and 13 mm insert with 2 patella, all components cemented  DICTATION: .Dragon Dictation  patient brought the operating room and after adequate anesthesia was obtained the right leg was prepped and draped in sterile fashion was turned by the upper thigh. After patient identification and timeout procedures were completed midline incision was made followed by medial parapatellar arthrotomy. Inspection revealed moderate degenerative changes throughout the knee particularly medialcompartment there is exposed bone over the entire condyle tibial and femoral without significant bone loss. . Fat pad and PCL and anterior cruciate ligament were excised at this time and proximal tibia cutting block applied with proximal tibia cut carried out.. The 4-in-1 cutting block was applied anterior posterior and chamfer cuts made. The proximal tibia was prepared with removal of posterior horns of menisci. Placement of the3 tibial baseplate and proximal tibial preparation with drillingwithproximal preparation for short stem. With the trial baseplate in place [id number] trial was placed and a 9mm insert gave excellent  stability. Distal femoral drill holes were made followed by the trochlear groove cut with thereaming for the trochlear groove. These trials were then removed and the patella cut using the patellar cutting guide and measured to a size2.injection of the above local was placed and the tourniquet then raised.Bony surfaces were thoroughly irrigated and dried,the tibial component was cemented into place first followed by the placement of the polyethylene component,set screw with torque screwdriver and femoral component with excess cement removed and the knee held in extension patellar button was then clamped into place after the cementhadset the patella did track well, tourniquet then let down and bleeding checked electrocautery. After thorough irrigation of the knee the arthrotomy was repaired heavy Quill suture to close the capsule.3-0 v-locsubcutaneously followed by skin staples, Xeroform 4 x 4's ABD web roll, followed by Ace wrapand Polar Care    PLAN OF CARE: Admit to inpatient   PATIENT DISPOSITION:  PACU - hemodynamically stable.

## 2017-05-31 ENCOUNTER — Encounter: Payer: Self-pay | Admitting: Orthopedic Surgery

## 2017-05-31 NOTE — Progress Notes (Signed)
Clinical Social Worker (CSW) received SNF consult. PT is recommending home health. RN case manager aware of above. Please reconsult if future social work needs arise. CSW signing off.   Taylorann Tkach, LCSW [phone] 

## 2017-05-31 NOTE — Progress Notes (Signed)
   Subjective: 1 Day Post-Op Procedure(s) (LRB): TOTAL KNEE ARTHROPLASTY (Right) Patient reports pain as 5 on 0-10 scale.   Patient is well, and has had no acute complaints or problems Denies any CP, SOB, ABD pain. We will continue therapy today.  Plan is to go Home after hospital stay.  Objective: Vital signs in last 24 hours: Temp:  [97.4 F (36.3 C)-98.3 F (36.8 C)] 98.3 F (36.8 C) (03/20 0342) Pulse Rate:  [58-78] 64 (03/20 0342) Resp:  [11-20] 17 (03/20 0342) BP: (121-142)/(59-75) 129/59 (03/20 0342) SpO2:  [94 %-99 %] 95 % (03/20 0342) Weight:  [92.6 kg (204 lb 3.2 oz)] 92.6 kg (204 lb 3.2 oz) (03/19 1030)  Intake/Output from previous day: 03/19 0701 - 03/20 0700 In: 3679.3 [P.O.:480; I.V.:2999.3; IV Piggyback:200] Out: 1520 [Urine:1500; Blood:20] Intake/Output this shift: No intake/output data recorded.  Recent Labs    05/30/17 0628 05/30/17 1137  HGB 13.3 12.4   Recent Labs    05/30/17 0628 05/30/17 1137  WBC  --  4.9  RBC  --  4.10  HCT 39.0 36.3  PLT  --  131*   Recent Labs    05/30/17 0628 05/30/17 1137  NA 140  --   K 3.1*  --   CREATININE  --  1.02*  GLUCOSE 117*  --    No results for input(s): LABPT, INR in the last 72 hours.  EXAM General - Patient is Alert, Appropriate and Oriented Extremity - Neurovascular intact Sensation intact distally Intact pulses distally Dorsiflexion/Plantar flexion intact No cellulitis present Compartment soft Dressing - dressing C/D/I and no drainage Motor Function - intact, moving foot and toes well on exam.   Past Medical History:  Diagnosis Date  . Anxiety   . Arthritis    thumbs, hands  . Cancer [facility]) 1991   bilateral breast  . Depression   . GERD (gastroesophageal reflux disease)   . Headache    occasional - pinched nerve in neck  . Hypertension   . Hypothyroidism   . Thyroid disease   . Wears dentures    full upper, partial lower    Assessment/Plan:   1 Day Post-Op Procedure(s)  (LRB): TOTAL KNEE ARTHROPLASTY (Right) Active Problems:   Status post total knee replacement using cement, right  Estimated body mass index is 33.98 kg/m as calculated from the following:   Height as of this encounter: 5\' 5"  (1.651 m).   Weight as of this encounter: 92.6 kg (204 lb 3.2 oz). [facility] diet Up with therapy  Needs BM Pain controlled  VSS Labs pending for this am CM to assist with discharge to home with HHPPT   DVT Prophylaxis - Lovenox, Foot Pumps and TED hose Weight-Bearing as tolerated to right leg   T. Rachelle Hora, PA-C [place] 05/31/2017, 8:13 AM

## 2017-05-31 NOTE — Progress Notes (Signed)
Physical Therapy Treatment Patient Details Name: Kaitlyn Oliver MRN: [id number] DOB: May 27, 1933 Today's Date: 05/31/2017    History of Present Illness Kaitlyn Oliver is an 84yo white female who comes to [facility] for elective right TKA. Pt reports bilat mastectomy 30ya. At baseline pt was ambulatory without AD, no falls.     PT Comments    Pt is able to complete all supine exercises with therapist demonstrating good RLE strength. She requires supervision for bed mobility and CGA only for transfers. She is able to ambulate from bed to just outside of doorway. Cues for correct sequencing with walker. Pt reports gradually worsening nausea as distance progresses. Once outside door she states she needs to sit down and she feels sweaty. Vitals obtained and are stable. RN comes and gives IV Zofran. Pt allowed to rest but reports continued nausea and inability to participate in more therapy at the moment. Recliner brought to patient and she is wheeled back to room. Further ambulation and exercise deferred. Pt easily gets overheated during ambulation which increases her nausea. Will attempt to progress ambulation more this PM as well as obtain ROM measures of her knee. Pt will benefit from PT services to address deficits in strength, balance, and mobility in order to return to full function at home.    Follow Up Recommendations  Home health PT     Equipment Recommendations  None recommended by PT    Recommendations for Other Services       Precautions / Restrictions Precautions Precautions: Fall;Knee Restrictions Weight Bearing Restrictions: Yes LLE Weight Bearing: Weight bearing as tolerated    Mobility  Bed Mobility Overal bed mobility: Needs Assistance Bed Mobility: Supine to Sit     Supine to sit: Supervision     General bed mobility comments: HOB elevated and use of bed rails. Fair speed with no external assist required  Transfers Overall transfer level: Needs  assistance Equipment used: Rolling walker (2 wheeled) Transfers: Sit to/from Stand Sit to Stand: Min guard         General transfer comment: additional time for planning and safe RW use. Safe hand placement demonstrated. Good stability in standing  Ambulation/Gait Ambulation/Gait assistance: Min guard Ambulation Distance (Feet): 20 Feet Assistive device: Rolling walker (2 wheeled) Gait Pattern/deviations: Step-through pattern   Gait velocity interpretation: <1.8 ft/sec, indicative of risk for recurrent falls General Gait Details: Pt is able to ambulate from bed to just outside of doorway. Cues for correct sequencing with walker. She reports gradually worsening nausea as distance progresses. Once outside door she states she needs to sit down and she feels sweaty. Vitals obtained and are stable. RN comes and gives IV Zofran. Pt allowed to rest but reports continued nausea and inability to participate at the moment. Recliner brought to patient and she is wheeled back to room. Further ambulation deferred   Stairs            Wheelchair Mobility    Modified Rankin (Stroke Patients Only)       Balance Overall balance assessment: Modified Independent                                          Cognition Arousal/Alertness: Awake/alert Behavior During Therapy: WFL for tasks assessed/performed Overall Cognitive Status: Within Functional Limits for tasks assessed  Exercises Total Joint Exercises Ankle Circles/Pumps: Both;10 reps Quad Sets: Both;10 reps Gluteal Sets: Both;10 reps Towel Squeeze: Both;10 reps Short Arc Quad: Right;10 reps Heel Slides: Right;10 reps Hip ABduction/ADduction: Right;10 reps Straight Leg Raises: Right;10 reps Goniometric ROM: Deferred to PM session    General Comments        Pertinent Vitals/Pain Pain Assessment: 0-10 Pain Score: 5  Pain Location: right knee Pain  Intervention(s): Monitored during session    Home Living                      Prior Function            PT Goals (current goals can now be found in the care plan section) Acute Rehab PT Goals Patient Stated Goal: return to home and start HHPT there PT Goal Formulation: With patient Time For Goal Achievement: 06/13/17 Potential to Achieve Goals: Good Progress towards PT goals: Progressing toward goals    Frequency    BID      PT Plan Current plan remains appropriate    Co-evaluation              AM-PAC PT "6 Clicks" Daily Activity  Outcome Measure  Difficulty turning over in bed (including adjusting bedclothes, sheets and blankets)?: A Little Difficulty moving from lying on back to sitting on the side of the bed? : A Little Difficulty sitting down on and standing up from a chair with arms (e.g., wheelchair, bedside commode, etc,.)?: A Little Help needed moving to and from a bed to chair (including a wheelchair)?: A Little Help needed walking in hospital room?: A Little Help needed climbing 3-5 steps with a railing? : A Little 6 Click Score: 18    End of Session Equipment Utilized During Treatment: Gait belt Activity Tolerance: Patient tolerated treatment well Patient left: in chair;with call bell/phone within reach;with chair alarm set;with family/visitor present;with SCD's reapplied;Other (comment)(towel roll under heel and polar care in place) Nurse Communication: Mobility status PT Visit Diagnosis: Muscle weakness (generalized) (M62.81);Difficulty in walking, not elsewhere classified (R26.2)     Time: 1610-9604 PT Time Calculation (min) (ACUTE ONLY): 24 min  Charges:  $Gait Training: 8-22 mins $Therapeutic Exercise: 8-22 mins                    G Codes:       Sharalyn Ink Nadezhda Pollitt PT, DPT     Kenia Teagarden 05/31/2017, 11:24 AM

## 2017-05-31 NOTE — Care Management (Signed)
RNCM spoke with patient to clarify pharmacy. She uses [facility] [phone] Lovenox 40mg  injection daily for 40 days called into [facility] for price per Dr. Rudene Christians request.

## 2017-05-31 NOTE — Progress Notes (Signed)
Physical Therapy Treatment Patient Details Name: Kaitlyn Oliver MRN: [id number] DOB: 1933/11/11 Today's Date: 05/31/2017    History of Present Illness Kaitlyn Oliver is an 84yo white female who comes to [facility] for elective right TKA. Pt reports bilat mastectomy 30ya. At baseline pt was ambulatory without AD, no falls.     PT Comments    Pt agreeable to PT; notes feeling better than this morning. Pt reports 4/10 pain R knee and no longer nauseated. Pt educated in stretching/strengthening for R knee in seated and long sit currently and for carryover to home. Pt uses unorthodox hand placement for sit to stand transfer, but states this is the only way she can stand; does so without LOB. Pt progresses ambulation distance nicely this session with slow, but reciprocal and fairly fluid gait pattern. Pt is fatigued post 200 ft walk, but did not wish to keep walk shorter when asked during walk. R knee active range of motion 5-85 degrees; encouraged quad set with gravity assisted extension currently and at home. Pt wished return to bed this session. Pt will need stair training next session prior to discharge. Continue PT to progress R knee range, overall endurance and strength to allow for an optimal, safe transition home.    Follow Up Recommendations  Home health PT     Equipment Recommendations  None recommended by PT    Recommendations for Other Services       Precautions / Restrictions Precautions Precautions: Fall;Knee Restrictions Weight Bearing Restrictions: Yes LLE Weight Bearing: Weight bearing as tolerated    Mobility  Bed Mobility Overal bed mobility: Needs Assistance Bed Mobility: Sit to Supine       Sit to supine: Min guard   General bed mobility comments: min guard for safety/protect from hitting bed rails  Transfers Overall transfer level: Needs assistance Equipment used: Rolling walker (2 wheeled) Transfers: Sit to/from Stand Sit to Stand: Min guard          General transfer comment: Cues for safe use of hands with stand, but pt insists on B hands on rw "this is the way I do it; I can't do it the other way"  Ambulation/Gait Ambulation/Gait assistance: Min guard Ambulation Distance (Feet): 200 Feet Assistive device: Rolling walker (2 wheeled) Gait Pattern/deviations: Step-through pattern   Gait velocity interpretation: <1.8 ft/sec, indicative of risk for recurrent falls General Gait Details: Reciprocal pattern, slow speed, but consistent. Mostly fluid with mild pause post L step. Pt tends to step too far into rw without LOB. Does not correct with cues and seems to be baseline technique for pt.    Stairs            Wheelchair Mobility    Modified Rankin (Stroke Patients Only)       Balance Overall balance assessment: Needs assistance Sitting-balance support: Feet supported Sitting balance-Leahy Scale: Good     Standing balance support: Bilateral upper extremity supported Standing balance-Leahy Scale: Fair                              Cognition Arousal/Alertness: Awake/alert Behavior During Therapy: WFL for tasks assessed/performed Overall Cognitive Status: Within Functional Limits for tasks assessed                                        Exercises Total Joint Exercises Quad Sets: Strengthening;Both;20 reps(also  in stand prior to gait) Long Arc Quad: AROM;Right;10 reps Knee Flexion: AROM;Right;10 reps;Seated(3 positions each rep) Goniometric ROM: 5-85 Other Exercises Other Exercises: Educated on range/stretching protocols for home and positioning.  Other Exercises: Educated on improved use of RLE with STS transfer for strength/stretch.    General Comments        Pertinent Vitals/Pain Pain Assessment: 0-10 Pain Score: 4  Pain Location: right knee Pain Intervention(s): Repositioned;Ice applied;Monitored during session    Home Living                      Prior Function             PT Goals (current goals can now be found in the care plan section) Progress towards PT goals: Progressing toward goals    Frequency    BID      PT Plan Current plan remains appropriate    Co-evaluation              AM-PAC PT "6 Clicks" Daily Activity  Outcome Measure  Difficulty turning over in bed (including adjusting bedclothes, sheets and blankets)?: A Little Difficulty moving from lying on back to sitting on the side of the bed? : A Little Difficulty sitting down on and standing up from a chair with arms (e.g., wheelchair, bedside commode, etc,.)?: A Little Help needed moving to and from a bed to chair (including a wheelchair)?: A Little Help needed walking in hospital room?: A Little Help needed climbing 3-5 steps with a railing? : A Little 6 Click Score: 18    End of Session Equipment Utilized During Treatment: Gait belt Activity Tolerance: Patient tolerated treatment well Patient left: in bed;with call bell/phone within reach;with bed alarm set;with family/visitor present;Other (comment)(polar care in place)   PT Visit Diagnosis: Muscle weakness (generalized) (M62.81);Difficulty in walking, not elsewhere classified (R26.2)     Time: 6333-5456 PT Time Calculation (min) (ACUTE ONLY): 37 min  Charges:  $Gait Training: 8-22 mins $Therapeutic Exercise: 8-22 mins                    G Codes:        Larae Grooms, PTA 05/31/2017, 5:39 PM

## 2017-05-31 NOTE — Anesthesia Postprocedure Evaluation (Signed)
Anesthesia Post Note  Patient: Kaitlyn Oliver  Procedure(s) Performed: TOTAL KNEE ARTHROPLASTY (Right )  Patient location during evaluation: PACU Anesthesia Type: Spinal Level of consciousness: oriented and awake and alert Pain management: pain level controlled Vital Signs Assessment: post-procedure vital signs reviewed and stable Respiratory status: spontaneous breathing, respiratory function stable and nonlabored ventilation Cardiovascular status: blood pressure returned to baseline and stable Postop Assessment: no headache and no backache Anesthetic complications: no     Last Vitals:  Vitals:   05/30/17 2348 05/31/17 0342  BP: 130/75 (!) 129/59  Pulse: 68 64  Resp: 15 17  Temp: 36.6 C 36.8 C  SpO2: 96% 95%    Last Pain:  Vitals:   05/31/17 0525  TempSrc:   PainSc: 7                  Siegfried Vieth

## 2017-05-31 NOTE — Care Management Note (Signed)
Case Management Note  Patient Details  Name: Kaitlyn Oliver MRN: [id number] Date of Birth: 04/25/1933  Subjective/Objective:   POD # 1 right Total Knee Arthroplasty. Met with patient and her spouse at bedside to discuss discharge planning. Offered home health agencies. Patient limited due to insurance. Referral to [facility] with [facility] for HHPT. Requested visit within 24 hours of discharge. She has a walker. [facility]: [facility]: [phone]. Called Lovenox 40 mg # 14 no refills per Dr. Rudene Christians.                  Action/Plan: [facility] for HHPT. Lovenox called in. Need to check price.   Expected Discharge Date:  06/02/17               Expected Discharge Plan:  [place]  In-House Referral:     Discharge planning Services  CM Consult  Post Acute Care Choice:  Home Health Choice offered to:  Patient, Spouse  DME Arranged:    DME Agency:     HH Arranged:  PT [place]:  [place]  Status of Service:  In process, will continue to follow  If discussed at Long Length of Stay Meetings, dates discussed:    Additional Comments:  Jolly Mango, RN 05/31/2017, 10:12 AM

## 2017-06-01 ENCOUNTER — Encounter: Payer: Self-pay | Admitting: Orthopedic Surgery

## 2017-06-01 LAB — BASIC METABOLIC PANEL
ANION GAP: 11 (ref 5–15)
BUN: 13 mg/dL (ref 6–20)
CALCIUM: 9.4 mg/dL (ref 8.9–10.3)
CO2: 26 mmol/L (ref 22–32)
CREATININE: 1.01 mg/dL — AB (ref 0.44–1.00)
Chloride: 99 mmol/L — ABNORMAL LOW (ref 101–111)
GFR, EST AFRICAN AMERICAN: 59 mL/min — AB (ref >60)
GFR, EST NON AFRICAN AMERICAN: 51 mL/min — AB (ref >60)
Glucose, Bld: 138 mg/dL — ABNORMAL HIGH (ref 65–99)
Potassium: 3.1 mmol/L — ABNORMAL LOW (ref 3.5–5.1)
Sodium: 136 mmol/L (ref 135–145)

## 2017-06-01 LAB — CBC
HCT: 37 % (ref 35.0–47.0)
Hemoglobin: 12.6 g/dL (ref 12.0–16.0)
MCH: 30.5 pg (ref 26.0–34.0)
MCHC: 34.1 g/dL (ref 32.0–36.0)
MCV: 89.6 fL (ref 80.0–100.0)
PLATELETS: 156 10*3/uL (ref 150–440)
RBC: 4.13 MIL/uL (ref 3.80–5.20)
RDW: 15.4 % — AB (ref 11.5–14.5)
WBC: 7.1 10*3/uL (ref 3.6–11.0)

## 2017-06-01 MED ORDER — HYDROCODONE-ACETAMINOPHEN 7.5-325 MG PO TABS: 1.0000 | ORAL_TABLET | ORAL | 0 refills | Status: DC | PRN

## 2017-06-01 MED ORDER — METHOCARBAMOL 500 MG PO TABS: 500.0000 mg | ORAL_TABLET | Freq: Four times a day (QID) | ORAL | 0 refills | Status: DC | PRN

## 2017-06-01 MED ORDER — ENOXAPARIN SODIUM 40 MG/0.4ML ~~LOC~~ SOLN: 40.0000 mg | SUBCUTANEOUS | 0 refills | Status: DC

## 2017-06-01 MED ORDER — ACETAMINOPHEN 325 MG PO TABS: 325.0000 mg | ORAL_TABLET | Freq: Four times a day (QID) | ORAL | Status: DC | PRN

## 2017-06-01 MED ORDER — DOCUSATE SODIUM 100 MG PO CAPS: 100.0000 mg | ORAL_CAPSULE | Freq: Two times a day (BID) | ORAL | 0 refills | Status: DC

## 2017-06-01 NOTE — Care Management (Signed)
RNCM spoke with patient regarding transition to home today. She is aware of Lovenox cost of $82.85 and agrees. Jerm[facility] with [facility] notified of patient discharge order today. Patient states she has a rolling walker, shower seat and bedside commode.  No other RNCM needs.

## 2017-06-01 NOTE — Progress Notes (Signed)
   Subjective: 2 Days Post-Op Procedure(s) (LRB): TOTAL KNEE ARTHROPLASTY (Right) Patient reports pain as mild.   Patient is well, and has had no acute complaints or problems Denies any CP, SOB, ABD pain. We will continue therapy today.  Plan is to go Home after hospital stay.  Objective: Vital signs in last 24 hours: Temp:  [97.7 F (36.5 C)-98.2 F (36.8 C)] 97.7 F (36.5 C) (03/21 0731) Pulse Rate:  [62-69] 62 (03/21 0731) Resp:  [16-18] 18 (03/21 0731) BP: (140-153)/(66-73) 140/73 (03/21 0731) SpO2:  [96 %-99 %] 96 % (03/21 0731)  Intake/Output from previous day: 03/20 0701 - 03/21 0700 In: 782 [P.O.:240; I.V.:542] Out: 1200 [Urine:1200] Intake/Output this shift: No intake/output data recorded.  Recent Labs    05/30/17 0628 05/30/17 1137  HGB 13.3 12.4   Recent Labs    05/30/17 0628 05/30/17 1137  WBC  --  4.9  RBC  --  4.10  HCT 39.0 36.3  PLT  --  131*   Recent Labs    05/30/17 0628 05/30/17 1137  NA 140  --   K 3.1*  --   CREATININE  --  1.02*  GLUCOSE 117*  --    No results for input(s): LABPT, INR in the last 72 hours.  EXAM General - Patient is Alert, Appropriate and Oriented Extremity - Neurovascular intact Sensation intact distally Intact pulses distally Dorsiflexion/Plantar flexion intact No cellulitis present Compartment soft Dressing - dressing C/D/I and no drainage Motor Function - intact, moving foot and toes well on exam.   Past Medical History:  Diagnosis Date  . Anxiety   . Arthritis    thumbs, hands  . Cancer [facility]) 1991   bilateral breast  . Depression   . GERD (gastroesophageal reflux disease)   . Headache    occasional - pinched nerve in neck  . Hypertension   . Hypothyroidism   . Thyroid disease   . Wears dentures    full upper, partial lower    Assessment/Plan:   2 Days Post-Op Procedure(s) (LRB): TOTAL KNEE ARTHROPLASTY (Right) Active Problems:   Status post total knee replacement using cement,  right  Estimated body mass index is 33.98 kg/m as calculated from the following:   Height as of this encounter: 5\' 5"  (1.651 m).   Weight as of this encounter: 92.6 kg (204 lb 3.2 oz). [facility] diet Up with therapy  Needs BM Pain controlled  VSS Labs pending for this am CM to assist with discharge to home with HHPPT Discharge home with HHPT today pending BM and completion of PT goals   DVT Prophylaxis - Lovenox, Foot Pumps and TED hose Weight-Bearing as tolerated to right leg   T. Rachelle Hora, PA-C [place] 06/01/2017, 11:37 AM

## 2017-06-01 NOTE — Progress Notes (Signed)
Physical Therapy Treatment Patient Details Name: Kaitlyn Oliver MRN: [id number] DOB: 1933/07/31 Today's Date: 06/01/2017    History of Present Illness Lucette Sladek is an 84yo white female who comes to [facility] for elective right TKA. Pt reports bilat mastectomy 30ya. At baseline pt was ambulatory without AD, no falls.     PT Comments    Split therapy session. Seen earlier in am for exercises.  During activity she stated pain increased.  Called RN who stated pt could have more pain medication.    Returned throughout morning but nausea prevented session.  Returned prior to lunch.  Pain under control.  Bed mobility with ease.  She was able to stand and ambulate to/from rehab gym with a seated rest break prior to stair training.  She required verbal cues for sequencing but overall performed well.  Gait with walker with supervision and verbal cues.  At times she walks too far up into walker and needs verbal cues to correct.  She also has poor hand placements but resists correction stating she feels safer her way.  She presents with no LOB or buckling.   Follow Up Recommendations  Home health PT     Equipment Recommendations  None recommended by PT    Recommendations for Other Services       Precautions / Restrictions Precautions Precautions: Fall;Knee Restrictions Weight Bearing Restrictions: Yes RLE Weight Bearing: Weight bearing as tolerated    Mobility  Bed Mobility Overal bed mobility: Modified Independent Bed Mobility: Sit to Supine     Supine to sit: Modified independent (Device/Increase time)        Transfers Overall transfer level: Needs assistance Equipment used: Rolling walker (2 wheeled) Transfers: Sit to/from Stand Sit to Stand: Min guard         General transfer comment: does not use proper hand placements but is generally steady, resists re-direction/education  Ambulation/Gait Ambulation/Gait assistance: Supervision Ambulation Distance (Feet): 200  Feet Assistive device: Rolling walker (2 wheeled) Gait Pattern/deviations: Decreased step length - right;Decreased step length - left;Step-through pattern   Gait velocity interpretation: Below normal speed for age/gender General Gait Details: verbal cues to keep walker further away from her.  corrects but returns to le's hitting front bar.  No LOB noted.   Stairs Stairs: Yes   Stair Management: Two rails Number of Stairs: 4 General stair comments: verbal cues for proper sequence.  Wheelchair Mobility    Modified Rankin (Stroke Patients Only)       Balance Overall balance assessment: Needs assistance Sitting-balance support: Feet supported Sitting balance-Leahy Scale: Good     Standing balance support: Bilateral upper extremity supported Standing balance-Leahy Scale: Fair                              Cognition Arousal/Alertness: Awake/alert Behavior During Therapy: WFL for tasks assessed/performed Overall Cognitive Status: Within Functional Limits for tasks assessed                                        Exercises Total Joint Exercises Ankle Circles/Pumps: Both;10 reps Quad Sets: Strengthening;Both;20 reps Gluteal Sets: Both;10 reps Towel Squeeze: Both;10 reps Short Arc Quad: Right;10 reps Heel Slides: Right;10 reps Hip ABduction/ADduction: Right;10 reps Straight Leg Raises: Right;10 reps Long Arc Quad: AROM;Right;10 reps Knee Flexion: AROM;Right;10 reps;Seated Goniometric ROM: 2-98    General Comments  Pertinent Vitals/Pain Pain Assessment: 0-10 Pain Score: 3  Pain Location: right knee Pain Descriptors / Indicators: Sore Pain Intervention(s): Limited activity within patient's tolerance;Premedicated before session    Home Living                      Prior Function            PT Goals (current goals can now be found in the care plan section) Progress towards PT goals: Progressing toward goals     Frequency    BID      PT Plan Current plan remains appropriate    Co-evaluation              AM-PAC PT "6 Clicks" Daily Activity  Outcome Measure  Difficulty turning over in bed (including adjusting bedclothes, sheets and blankets)?: A Little Difficulty moving from lying on back to sitting on the side of the bed? : A Little Difficulty sitting down on and standing up from a chair with arms (e.g., wheelchair, bedside commode, etc,.)?: A Little Help needed moving to and from a bed to chair (including a wheelchair)?: A Little Help needed walking in hospital room?: A Little Help needed climbing 3-5 steps with a railing? : A Little 6 Click Score: 18    End of Session Equipment Utilized During Treatment: Gait belt Activity Tolerance: Patient tolerated treatment well Patient left: in chair;with call bell/phone within reach;with family/visitor present;with chair alarm set         Time: 8341-9622 PT Time Calculation (min) (ACUTE ONLY): 38 min  Charges:  $Gait Training: 8-22 mins $Therapeutic Exercise: 8-22 mins $Therapeutic Activity: 8-22 mins                    G Codes:       Chesley Noon, PTA 06/01/17, 12:31 PM

## 2017-06-01 NOTE — Discharge Summary (Signed)
Physician Discharge Summary  Patient ID: Kaitlyn Oliver MRN: [id number] DOB/AGE: 1933/07/26 84 y.o.  Admit date: 05/30/2017 Discharge date: 06/01/2017  Admission Diagnoses:  primary localized osteoarthritis of right knee   Discharge Diagnoses: Patient Active Problem List   Diagnosis Date Noted  . Status post total knee replacement using cement, right 05/30/2017  . Gastroesophageal reflux disease 12/22/2015  . Adenomatous polyp 09/11/2014  . Bilateral cataracts 09/11/2014  . Gastric catarrh 09/11/2014  . Gastroduodenal ulcer 09/11/2014  . Hypothyroidism 09/11/2014  . Cardiac murmur 10/08/2013  . Essential (primary) hypertension 10/08/2013  . Combined fat and carbohydrate induced hyperlipemia 10/08/2013    Past Medical History:  Diagnosis Date  . Anxiety   . Arthritis    thumbs, hands  . Cancer [facility]) 1991   bilateral breast  . Depression   . GERD (gastroesophageal reflux disease)   . Headache    occasional - pinched nerve in neck  . Hypertension   . Hypothyroidism   . Thyroid disease   . Wears dentures    full upper, partial lower     Transfusion: none   Consultants (if any):   Discharged Condition: Improved  Hospital Course: Kaitlyn Oliver is an 84 y.o. female who was admitted 05/30/2017 with a diagnosis of right knee osteoarthritis and went to the operating room on 05/30/2017 and underwent the above named procedures.    Surgeries: Procedure(s): TOTAL KNEE ARTHROPLASTY on 05/30/2017 Patient tolerated the surgery well. Taken to PACU where she was stabilized and then transferred to the orthopedic floor.  Started on Lovenox 30 mg q 12 hrs. Foot pumps applied bilaterally at 80 mm. Heels elevated on bed with rolled towels. No evidence of DVT. Negative Homan. Physical therapy started on day #1 for gait training and transfer. OT started day #1 for ADL and assisted devices.  Patient's foley was d/c on day #1. Patient's IV  was d/c on day #2.  On post op day #2  patient was stable and ready for discharge to home with HHPT.  Implants: Medacta GM K sphere system 3+ femur, 3 tibia with short stem and 13 mm insert with 2 patella, all components cemented    She was given perioperative antibiotics:  Anti-infectives (From admission, onward)   Start     Dose/Rate Route Frequency Ordered Stop   05/30/17 1330  clindamycin (CLEOCIN) IVPB 900 mg     900 mg 100 mL/hr over 30 Minutes Intravenous Every 6 hours 05/30/17 1027 05/31/17 0811   05/30/17 0603  clindamycin (CLEOCIN) 900 MG/50ML IVPB    Note to Pharmacy:  Hallaji, Violet   : cabinet override      05/30/17 0603 05/30/17 0732   05/29/17 2215  clindamycin (CLEOCIN) IVPB 900 mg     900 mg 100 mL/hr over 30 Minutes Intravenous  Once 05/29/17 2200 05/30/17 0802    .  She was given sequential compression devices, early ambulation, and Lovenox for DVT prophylaxis.  She benefited maximally from the hospital stay and there were no complications.    Recent vital signs:  Vitals:   05/31/17 2337 06/01/17 0731  BP: (!) 145/66 140/73  Pulse: 66 62  Resp: 16 18  Temp: 98.2 F (36.8 C) 97.7 F (36.5 C)  SpO2: 97% 96%    Recent laboratory studies:  Lab Results  Component Value Date   HGB 12.4 05/30/2017   HGB 13.3 05/30/2017   HGB 13.0 05/17/2017   Lab Results  Component Value Date   WBC 4.9 05/30/2017  PLT 131 (L) 05/30/2017   Lab Results  Component Value Date   INR 1.04 05/17/2017   Lab Results  Component Value Date   NA 140 05/30/2017   K 3.1 (L) 05/30/2017   CL 102 05/17/2017   CO2 22 05/17/2017   BUN 16 05/17/2017   CREATININE 1.02 (H) 05/30/2017   GLUCOSE 117 (H) 05/30/2017    Discharge Medications:   Allergies as of 06/01/2017      Reactions   Adhesive [tape] Dermatitis   Can not use plastic tape. Paper tape ok    Penicillin G Itching   Has patient had a PCN reaction causing immediate rash, facial/tongue/throat swelling, SOB or lightheadedness with hypotension: No Has  patient had a PCN reaction causing severe rash involving mucus membranes or skin necrosis: No Has patient had a PCN reaction that required hospitalization: No Has patient had a PCN reaction occurring within the last 10 years: No If all of the above answers are "NO", then may proceed with Cephalosporin use.      Medication List    STOP taking these medications   celecoxib 200 MG capsule Commonly known as:  CELEBREX   ibuprofen 200 MG tablet Commonly known as:  ADVIL,MOTRIN     TAKE these medications   acetaminophen 325 MG tablet Commonly known as:  TYLENOL Take 1-2 tablets (325-650 mg total) by mouth every 6 (six) hours as needed for mild pain (pain score 1-3 or temp > 100.5).   busPIRone 5 MG tablet Commonly known as:  BUSPAR Take 1 tablet (5 mg total) by mouth 2 (two) times daily. What changed:    when to take this  reasons to take this   chlorthalidone 25 MG tablet Commonly known as:  HYGROTON Take 1 tablet (25 mg total) by mouth daily.   docusate sodium 100 MG capsule Commonly known as:  COLACE Take 1 capsule (100 mg total) by mouth 2 (two) times daily.   doxycycline 100 MG tablet Commonly known as:  VIBRA-TABS Take 1 tablet (100 mg total) by mouth 2 (two) times daily.   enoxaparin 40 MG/0.4ML injection Commonly known as:  LOVENOX Inject 0.4 mLs (40 mg total) into the skin daily for 14 days.   HYDROcodone-acetaminophen 7.5-325 MG tablet Commonly known as:  NORCO Take 1-2 tablets by mouth every 4 (four) hours as needed for severe pain (pain score 7-10).   methocarbamol 500 MG tablet Commonly known as:  ROBAXIN Take 1 tablet (500 mg total) by mouth every 6 (six) hours as needed for muscle spasms.   omeprazole 20 MG capsule Commonly known as:  PRILOSEC Take 1 capsule (20 mg total) by mouth daily.   Probiotic Caps Take 1 capsule by mouth daily as needed (diarrhea).   triamcinolone 55 MCG/ACT Aero nasal inhaler Commonly known as:  NASACORT ALLERGY  24HR Place 2 sprays into the nose daily. What changed:    when to take this  reasons to take this            Durable Medical Equipment  (From admission, onward)        Start     Ordered   05/30/17 1027  DME Bedside commode  Once    Question:  Patient needs a bedside commode to treat with the following condition  Answer:  Status post total knee replacement using cement, right   05/30/17 1027   05/30/17 1027  DME 3 n 1  Once     05/30/17 1027   05/30/17 1027  DME Walker rolling  Once    Question:  Patient needs a walker to treat with the following condition  Answer:  Status post total knee replacement using cement, right   05/30/17 1027      Diagnostic Studies: Dg Knee 1-2 Views Right  Result Date: 05/30/2017 CLINICAL DATA:  Status post right knee replacement today. EXAM: RIGHT KNEE - 1-2 VIEW COMPARISON:  Preoperative planning CT 05/03/2017. FINDINGS: The patient has a new right total knee replacement. The device is located. There is no fracture. Gas in the joint and surgical staples are noted. IMPRESSION: Status post right total knee replacement.  No acute abnormality. Electronically Signed   By: Inge Rise M.D.   On: 05/30/2017 09:42   Ct Knee Right Wo Contrast  Result Date: 05/03/2017 CLINICAL DATA:  Chronic right knee pain and swelling. Patient for right knee replacement. MY KNEE preoperative planning study. EXAM: CT OF THE RIGHT KNEE WITHOUT CONTRAST TECHNIQUE: Multidetector CT imaging of the right knee was performed according to the standard protocol. Multiplanar CT image reconstructions were also generated. Axial imaging only of the right ankle and hip was also performed. COMPARISON:  MRI right knee 08/26/2016. FINDINGS: Bones/Joint/Cartilage No acute abnormality is seen about the right hip or ankle. Mild to moderate appearing right hip osteoarthritis is seen with joint space narrowing posteriorly and a small subchondral cyst in the posterior acetabulum identified. The  patient is also status post hallux valgus repair. Imaging of the right knee demonstrates degenerative disease appearing worst in the medial compartment where there is near bone-on-bone joint space narrowing. Methylmethacrylate in the proximal tibia is noted. Remote, mild subchondral insufficiency fractures in the medial tibial plateau and weight-bearing medial femoral condyle are seen and were present on the prior exam. Ligaments Suboptimally assessed by CT. Muscles and Tendons Intact and normal appearance. Soft tissues Small to moderate joint effusion noted IMPRESSION: Advanced osteoarthritis of the right knee is worst in the medial compartment. Remote, small subchondral insufficiency fractures in both the medial femoral condyle and tibial plateau are noted. Methylmethacrylate is present proximal tibia. Osteoarthritis right hip. Electronically Signed   By: Inge Rise M.D.   On: 05/03/2017 10:38    Disposition:     Follow-up Information    Hessie Knows, MD Follow up in 2 week(s).   Specialty:  Orthopedic Surgery Contact information: [place] [address] [phone]            Signed: Dorise Hiss [facility] 06/01/2017, 11:45 AM

## 2017-06-01 NOTE — Progress Notes (Signed)
Pt is in no acute distress at this time. VSS. Pt has had a bowel movement. RN explained discharge instructions to pt and pt verbalized understanding. Pt wheeled to visitors entrance and assisted into vehicle with husband.

## 2017-06-01 NOTE — Discharge Instructions (Signed)

## 2017-06-02 DIAGNOSIS — Z853 Personal history of malignant neoplasm of breast: Secondary | ICD-10-CM | POA: Diagnosis not present

## 2017-06-02 DIAGNOSIS — K219 Gastro-esophageal reflux disease without esophagitis: Secondary | ICD-10-CM | POA: Diagnosis not present

## 2017-06-02 DIAGNOSIS — Z471 Aftercare following joint replacement surgery: Secondary | ICD-10-CM | POA: Diagnosis not present

## 2017-06-02 DIAGNOSIS — Z9013 Acquired absence of bilateral breasts and nipples: Secondary | ICD-10-CM | POA: Diagnosis not present

## 2017-06-02 DIAGNOSIS — Z96653 Presence of artificial knee joint, bilateral: Secondary | ICD-10-CM | POA: Diagnosis not present

## 2017-06-02 DIAGNOSIS — I10 Essential (primary) hypertension: Secondary | ICD-10-CM | POA: Diagnosis not present

## 2017-06-05 ENCOUNTER — Telehealth: Payer: Self-pay

## 2017-06-05 DIAGNOSIS — I10 Essential (primary) hypertension: Secondary | ICD-10-CM | POA: Diagnosis not present

## 2017-06-05 DIAGNOSIS — Z471 Aftercare following joint replacement surgery: Secondary | ICD-10-CM | POA: Diagnosis not present

## 2017-06-05 DIAGNOSIS — Z96653 Presence of artificial knee joint, bilateral: Secondary | ICD-10-CM | POA: Diagnosis not present

## 2017-06-05 DIAGNOSIS — Z9013 Acquired absence of bilateral breasts and nipples: Secondary | ICD-10-CM | POA: Diagnosis not present

## 2017-06-05 DIAGNOSIS — Z853 Personal history of malignant neoplasm of breast: Secondary | ICD-10-CM | POA: Diagnosis not present

## 2017-06-05 DIAGNOSIS — K219 Gastro-esophageal reflux disease without esophagitis: Secondary | ICD-10-CM | POA: Diagnosis not present

## 2017-06-05 NOTE — Telephone Encounter (Signed)
Follow up call regarding EMMI concerns. Patient states her only concern is that she is having an allergic reaction to her pain medication. She has called her orthopedic doctor and is waiting for a call back. She is taking all her other medications as prescribed. She states her wound is healing as expected. No other concerns voiced.

## 2017-06-07 DIAGNOSIS — Z9013 Acquired absence of bilateral breasts and nipples: Secondary | ICD-10-CM | POA: Diagnosis not present

## 2017-06-07 DIAGNOSIS — Z853 Personal history of malignant neoplasm of breast: Secondary | ICD-10-CM | POA: Diagnosis not present

## 2017-06-07 DIAGNOSIS — Z471 Aftercare following joint replacement surgery: Secondary | ICD-10-CM | POA: Diagnosis not present

## 2017-06-07 DIAGNOSIS — K219 Gastro-esophageal reflux disease without esophagitis: Secondary | ICD-10-CM | POA: Diagnosis not present

## 2017-06-07 DIAGNOSIS — I10 Essential (primary) hypertension: Secondary | ICD-10-CM | POA: Diagnosis not present

## 2017-06-07 DIAGNOSIS — Z96653 Presence of artificial knee joint, bilateral: Secondary | ICD-10-CM | POA: Diagnosis not present

## 2017-06-09 DIAGNOSIS — Z471 Aftercare following joint replacement surgery: Secondary | ICD-10-CM | POA: Diagnosis not present

## 2017-06-09 DIAGNOSIS — Z9013 Acquired absence of bilateral breasts and nipples: Secondary | ICD-10-CM | POA: Diagnosis not present

## 2017-06-09 DIAGNOSIS — Z853 Personal history of malignant neoplasm of breast: Secondary | ICD-10-CM | POA: Diagnosis not present

## 2017-06-09 DIAGNOSIS — K219 Gastro-esophageal reflux disease without esophagitis: Secondary | ICD-10-CM | POA: Diagnosis not present

## 2017-06-09 DIAGNOSIS — I10 Essential (primary) hypertension: Secondary | ICD-10-CM | POA: Diagnosis not present

## 2017-06-09 DIAGNOSIS — Z96653 Presence of artificial knee joint, bilateral: Secondary | ICD-10-CM | POA: Diagnosis not present

## 2017-06-12 DIAGNOSIS — I10 Essential (primary) hypertension: Secondary | ICD-10-CM | POA: Diagnosis not present

## 2017-06-12 DIAGNOSIS — Z96653 Presence of artificial knee joint, bilateral: Secondary | ICD-10-CM | POA: Diagnosis not present

## 2017-06-12 DIAGNOSIS — Z853 Personal history of malignant neoplasm of breast: Secondary | ICD-10-CM | POA: Diagnosis not present

## 2017-06-12 DIAGNOSIS — Z9013 Acquired absence of bilateral breasts and nipples: Secondary | ICD-10-CM | POA: Diagnosis not present

## 2017-06-12 DIAGNOSIS — Z471 Aftercare following joint replacement surgery: Secondary | ICD-10-CM | POA: Diagnosis not present

## 2017-06-12 DIAGNOSIS — K219 Gastro-esophageal reflux disease without esophagitis: Secondary | ICD-10-CM | POA: Diagnosis not present

## 2017-06-14 DIAGNOSIS — Z96651 Presence of right artificial knee joint: Secondary | ICD-10-CM | POA: Diagnosis not present

## 2017-06-14 DIAGNOSIS — M25661 Stiffness of right knee, not elsewhere classified: Secondary | ICD-10-CM | POA: Diagnosis not present

## 2017-06-14 DIAGNOSIS — R531 Weakness: Secondary | ICD-10-CM | POA: Diagnosis not present

## 2017-06-20 DIAGNOSIS — R531 Weakness: Secondary | ICD-10-CM | POA: Diagnosis not present

## 2017-06-20 DIAGNOSIS — M25661 Stiffness of right knee, not elsewhere classified: Secondary | ICD-10-CM | POA: Diagnosis not present

## 2017-06-20 DIAGNOSIS — Z96651 Presence of right artificial knee joint: Secondary | ICD-10-CM | POA: Diagnosis not present

## 2017-06-22 DIAGNOSIS — M25561 Pain in right knee: Secondary | ICD-10-CM | POA: Diagnosis not present

## 2017-06-22 DIAGNOSIS — G8929 Other chronic pain: Secondary | ICD-10-CM | POA: Diagnosis not present

## 2017-06-22 DIAGNOSIS — M25661 Stiffness of right knee, not elsewhere classified: Secondary | ICD-10-CM | POA: Diagnosis not present

## 2017-06-22 DIAGNOSIS — Z96651 Presence of right artificial knee joint: Secondary | ICD-10-CM | POA: Diagnosis not present

## 2017-06-22 DIAGNOSIS — R531 Weakness: Secondary | ICD-10-CM | POA: Diagnosis not present

## 2017-06-27 DIAGNOSIS — M25661 Stiffness of right knee, not elsewhere classified: Secondary | ICD-10-CM | POA: Diagnosis not present

## 2017-06-27 DIAGNOSIS — Z96651 Presence of right artificial knee joint: Secondary | ICD-10-CM | POA: Diagnosis not present

## 2017-06-27 DIAGNOSIS — R531 Weakness: Secondary | ICD-10-CM | POA: Diagnosis not present

## 2017-07-12 DIAGNOSIS — Z96651 Presence of right artificial knee joint: Secondary | ICD-10-CM | POA: Diagnosis not present

## 2017-07-13 ENCOUNTER — Ambulatory Visit (INDEPENDENT_AMBULATORY_CARE_PROVIDER_SITE_OTHER): Payer: Medicare HMO | Admitting: Family Medicine

## 2017-07-13 ENCOUNTER — Encounter: Payer: Self-pay | Admitting: Family Medicine

## 2017-07-13 VITALS — BP 120/70 | HR 88 | Ht 67.0 in | Wt 197.0 lb

## 2017-07-13 DIAGNOSIS — J0100 Acute maxillary sinusitis, unspecified: Secondary | ICD-10-CM

## 2017-07-13 MED ORDER — AZITHROMYCIN 250 MG PO TABS: ORAL_TABLET | ORAL | 0 refills | Status: DC

## 2017-07-13 NOTE — Progress Notes (Signed)
Name: Kaitlyn Oliver   MRN: [id number]    DOB: September 06, 1933   Date:07/13/2017       Progress Note  Subjective  Chief Complaint  Chief Complaint  Patient presents with  . Sinusitis    cough and cong, raw throat    Sinusitis  This is a new problem. The current episode started in the past 7 days. The problem has been gradually worsening since onset. There has been no fever. The fever has been present for 3 to 4 days. The pain is mild. Associated symptoms include congestion, coughing, a hoarse voice, sinus pressure, sneezing and a sore throat. Pertinent negatives include no chills, diaphoresis, ear pain, headaches, neck pain, shortness of breath or swollen glands. Past treatments include nothing.    No problem-specific Assessment & Plan notes found for this encounter.   Past Medical History:  Diagnosis Date  . Anxiety   . Arthritis    thumbs, hands  . Cancer [facility]) 1991   bilateral breast  . Depression   . GERD (gastroesophageal reflux disease)   . Headache    occasional - pinched nerve in neck  . Hypertension   . Hypothyroidism   . Thyroid disease   . Wears dentures    full upper, partial lower    Past Surgical History:  Procedure Laterality Date  . BREAST SURGERY Bilateral 1991   mastectomy  . CATARACT EXTRACTION Bilateral   . CHOLECYSTECTOMY    . COLONOSCOPY  2013   Dr Gustavo Lah  . HALLUX VALGUS AUSTIN Right 06/03/2015   Procedure: HALLUX VALGUS AUSTIN;  Surgeon: Samara Deist, DPM;  Location: [facility];  Service: Podiatry;  Laterality: Right;  WITH POPLITEAL  . KNEE ARTHROSCOPY WITH MEDIAL MENISECTOMY Right 08/30/2016   Procedure: KNEE ARTHROSCOPY WITH PARTIAL MEDIAL MENISECTOMY and Partial Lateral Menisectomy;  Surgeon: Hessie Knows, MD;  Location: [facility];  Service: Orthopedics;  Laterality: Right;  . KNEE ARTHROSCOPY WITH SUBCHONDROPLASTY Right 08/30/2016   Procedure: KNEE ARTHROSCOPY WITH SUBCHONDROPLASTY;  Surgeon: Hessie Knows, MD;  Location: [facility];   Service: Orthopedics;  Laterality: Right;  . MASTECTOMY Bilateral   . ROTATOR CUFF REPAIR Bilateral   . TONSILLECTOMY    . TOTAL KNEE ARTHROPLASTY Left   . TOTAL KNEE ARTHROPLASTY Right 05/30/2017   Procedure: TOTAL KNEE ARTHROPLASTY;  Surgeon: Hessie Knows, MD;  Location: [facility];  Service: Orthopedics;  Laterality: Right;  Marland Kitchen VAGINAL HYSTERECTOMY      Family History  Problem Relation Age of Onset  . Pulmonary embolism Mother   . Healthy Father     Social History   Socioeconomic History  . Marital status: Soil scientist    Spouse name: Not on file  . Number of children: 1  . Years of education: Not on file  . Highest education level: 12th grade  Occupational History  . Occupation: Retired  Scientific laboratory technician  . Financial resource strain: Not hard at all  . Food insecurity:    Worry: Never true    Inability: Never true  . Transportation needs:    Medical: No    Non-medical: No  Tobacco Use  . Smoking status: Former Smoker    Packs/day: 0.25    Years: 10.00    Pack years: 2.50    Types: Cigarettes    Last attempt to quit: 1991    Years since quitting: 28.3  . Smokeless tobacco: Never Used  . Tobacco comment: smoking cessation materials not required  Substance and Sexual Activity  . Alcohol use: No  Alcohol/week: 0.0 oz  . Drug use: No  . Sexual activity: Not Currently  Lifestyle  . Physical activity:    Days per week: 2 days    Minutes per session: 60 min  . Stress: Not at all  Relationships  . Social connections:    Talks on phone: Patient refused    Gets together: Patient refused    Attends religious service: Patient refused    Active member of club or organization: Patient refused    Attends meetings of clubs or organizations: Patient refused    Relationship status: Patient refused  . Intimate partner violence:    Fear of current or ex partner: No    Emotionally abused: No    Physically abused: No    Forced sexual activity: No  Other Topics Concern   . Not on file  Social History Narrative  . Not on file    Allergies  Allergen Reactions  . Adhesive [Tape] Dermatitis    Can not use plastic tape. Paper tape ok   . Penicillin G Itching    Has patient had a PCN reaction causing immediate rash, facial/tongue/throat swelling, SOB or lightheadedness with hypotension: No Has patient had a PCN reaction causing severe rash involving mucus membranes or skin necrosis: No Has patient had a PCN reaction that required hospitalization: No Has patient had a PCN reaction occurring within the last 10 years: No If all of the above answers are "NO", then may proceed with Cephalosporin use.     Outpatient Medications Prior to Visit  Medication Sig Dispense Refill  . acetaminophen (TYLENOL) 325 MG tablet Take 1-2 tablets (325-650 mg total) by mouth every 6 (six) hours as needed for mild pain (pain score 1-3 or temp > 100.5).    . busPIRone (BUSPAR) 5 MG tablet Take 1 tablet (5 mg total) by mouth 2 (two) times daily. (Patient taking differently: Take 5 mg by mouth daily as needed (anxiety). ) 180 tablet 1  . chlorthalidone (HYGROTON) 25 MG tablet Take 1 tablet (25 mg total) by mouth daily. 90 tablet 2  . HYDROcodone-acetaminophen (NORCO) 7.5-325 MG tablet Take 1-2 tablets by mouth every 4 (four) hours as needed for severe pain (pain score 7-10). 40 tablet 0  . omeprazole (PRILOSEC) 20 MG capsule Take 1 capsule (20 mg total) by mouth daily. 90 capsule 2  . Probiotic CAPS Take 1 capsule by mouth daily as needed (diarrhea).    . triamcinolone (NASACORT ALLERGY 24HR) 55 MCG/ACT AERO nasal inhaler Place 2 sprays into the nose daily. (Patient taking differently: Place 2 sprays into the nose daily as needed (allergies). ) 1 Inhaler 11  . docusate sodium (COLACE) 100 MG capsule Take 1 capsule (100 mg total) by mouth 2 (two) times daily. 10 capsule 0  . doxycycline (VIBRA-TABS) 100 MG tablet Take 1 tablet (100 mg total) by mouth 2 (two) times daily. (Patient not  taking: Reported on 05/05/2017) 20 tablet 0  . enoxaparin (LOVENOX) 40 MG/0.4ML injection Inject 0.4 mLs (40 mg total) into the skin daily for 14 days. 14 Syringe 0  . methocarbamol (ROBAXIN) 500 MG tablet Take 1 tablet (500 mg total) by mouth every 6 (six) hours as needed for muscle spasms. 30 tablet 0   No facility-administered medications prior to visit.     Review of Systems  Constitutional: Negative for chills, diaphoresis, fever, malaise/fatigue and weight loss.  HENT: Positive for congestion, hoarse voice, sinus pressure, sneezing and sore throat. Negative for ear discharge and ear pain.  Eyes: Negative for blurred vision.  Respiratory: Positive for cough. Negative for sputum production, shortness of breath and wheezing.   Cardiovascular: Negative for chest pain, palpitations and leg swelling.  Gastrointestinal: Negative for abdominal pain, blood in stool, constipation, diarrhea, heartburn, melena and nausea.  Genitourinary: Negative for dysuria, frequency, hematuria and urgency.  Musculoskeletal: Negative for back pain, joint pain, myalgias and neck pain.  Skin: Negative for rash.  Neurological: Negative for dizziness, tingling, sensory change, focal weakness and headaches.  Endo/Heme/Allergies: Negative for environmental allergies and polydipsia. Does not bruise/bleed easily.  Psychiatric/Behavioral: Negative for depression and suicidal ideas. The patient is not nervous/anxious and does not have insomnia.      Objective  Vitals:   07/13/17 1011  BP: 120/70  Pulse: 88  Weight: 197 lb (89.4 kg)  Height: 5\' 7"  (1.702 m)    Physical Exam  Constitutional: She is oriented to person, place, and time. She appears well-developed and well-nourished.  HENT:  Head: Normocephalic.  Right Ear: Tympanic membrane and external ear normal.  Left Ear: Tympanic membrane and external ear normal.  Nose: Right sinus exhibits maxillary sinus tenderness. Right sinus exhibits no frontal sinus  tenderness. Left sinus exhibits maxillary sinus tenderness. Left sinus exhibits no frontal sinus tenderness.  Mouth/Throat: Uvula is midline and oropharynx is clear and moist.  Eyes: Pupils are equal, round, and reactive to light. Conjunctivae and EOM are normal. Lids are everted and swept, no foreign bodies found. Left eye exhibits no hordeolum. No foreign body present in the left eye. Right conjunctiva is not injected. Left conjunctiva is not injected. No scleral icterus.  Neck: Normal range of motion. Neck supple. No JVD present. No tracheal deviation present. No thyromegaly present.  Cardiovascular: Normal rate, regular rhythm, normal heart sounds and intact distal pulses. Exam reveals no gallop and no friction rub.  No murmur heard. Pulmonary/Chest: Effort normal and breath sounds normal. No respiratory distress. She has no wheezes. She has no rales.  Abdominal: Soft. Bowel sounds are normal. She exhibits no mass. There is no hepatosplenomegaly. There is no tenderness. There is no rebound and no guarding.  Musculoskeletal: Normal range of motion. She exhibits no edema or tenderness.  Lymphadenopathy:    She has no cervical adenopathy.  Neurological: She is alert and oriented to person, place, and time. She has normal strength. She displays normal reflexes. No cranial nerve deficit.  Skin: Skin is warm. No rash noted.  Psychiatric: She has a normal mood and affect. Her mood appears not anxious. She does not exhibit a depressed mood.  Nursing note and vitals reviewed.     Assessment & Plan  Problem List Items Addressed This Visit    None    Visit Diagnoses    Acute non-recurrent maxillary sinusitis    -  Primary   Relevant Medications   azithromycin (ZITHROMAX) 250 MG tablet      Meds ordered this encounter  Medications  . azithromycin (ZITHROMAX) 250 MG tablet    Sig: 2 today then 1 a day for 4 days    Dispense:  6 tablet    Refill:  0      Dr. Macon Large  [facility]  07/13/17

## 2017-07-17 ENCOUNTER — Telehealth: Payer: Self-pay

## 2017-07-17 NOTE — Telephone Encounter (Signed)
Pt wanted cough syrup sent in- was seen on 07/13/2017- called into [facility]

## 2017-07-25 DIAGNOSIS — H353131 Nonexudative age-related macular degeneration, bilateral, early dry stage: Secondary | ICD-10-CM | POA: Diagnosis not present

## 2017-07-27 ENCOUNTER — Other Ambulatory Visit: Payer: Self-pay

## 2017-08-15 ENCOUNTER — Ambulatory Visit (INDEPENDENT_AMBULATORY_CARE_PROVIDER_SITE_OTHER): Payer: Medicare HMO | Admitting: Family Medicine

## 2017-08-15 ENCOUNTER — Encounter: Payer: Self-pay | Admitting: Family Medicine

## 2017-08-15 VITALS — BP 130/80 | HR 72 | Ht 67.0 in | Wt 209.0 lb

## 2017-08-15 DIAGNOSIS — B373 Candidiasis of vulva and vagina: Secondary | ICD-10-CM

## 2017-08-15 DIAGNOSIS — B370 Candidal stomatitis: Secondary | ICD-10-CM | POA: Diagnosis not present

## 2017-08-15 DIAGNOSIS — B3731 Acute candidiasis of vulva and vagina: Secondary | ICD-10-CM

## 2017-08-15 DIAGNOSIS — M47812 Spondylosis without myelopathy or radiculopathy, cervical region: Secondary | ICD-10-CM | POA: Diagnosis not present

## 2017-08-15 DIAGNOSIS — E039 Hypothyroidism, unspecified: Secondary | ICD-10-CM

## 2017-08-15 MED ORDER — CELECOXIB 100 MG PO CAPS: 100.0000 mg | ORAL_CAPSULE | Freq: Every day | ORAL | 1 refills | Status: DC

## 2017-08-15 MED ORDER — FLUCONAZOLE 150 MG PO TABS: 150.0000 mg | ORAL_TABLET | Freq: Once | ORAL | 0 refills | Status: AC

## 2017-08-15 MED ORDER — LEVOTHYROXINE SODIUM 50 MCG PO TABS: 50.0000 ug | ORAL_TABLET | Freq: Every day | ORAL | 3 refills | Status: DC

## 2017-08-15 MED ORDER — NYSTATIN 100000 UNIT/GM EX CREA: 1.0000 "application " | TOPICAL_CREAM | Freq: Two times a day (BID) | CUTANEOUS | 0 refills | Status: DC

## 2017-08-15 NOTE — Progress Notes (Signed)
Name: Kaitlyn Oliver   MRN: [id number]    DOB: 1933/08/23   Date:08/15/2017       Progress Note  Subjective  Chief Complaint  Chief Complaint  Patient presents with  . Hypothyroidism    needs to check thyroid- "my neck hurts and goes down the front of neck"- use to be on thyroid med?  . yeast under skin folds    wants a refill on Nystatin cream    Neck Pain   This is a new problem. The current episode started more than 1 month ago (2 months). The problem occurs daily. The problem has been waxing and waning (" comes and goes"). The pain is present in the right side and left side. The quality of the pain is described as aching and cramping. The pain is at a severity of 5/10. The pain is moderate. Nothing aggravates the symptoms. Pertinent negatives include no chest pain, fever, headaches, leg pain, numbness, pain with swallowing, paresis, photophobia, syncope, tingling, trouble swallowing, visual change, weakness or weight loss. She has tried NSAIDs (tylenol /) for the symptoms. The treatment provided no relief.  Thyroid Problem  Presents for follow-up visit. Symptoms include cold intolerance, dry skin, fatigue, hair loss and heat intolerance. Patient reports no anxiety, constipation, depressed mood, diaphoresis, diarrhea, hoarse voice, leg swelling, nail problem, palpitations, tremors, visual change, weight gain or weight loss. The symptoms have been worsening.  Rash  This is a new problem. Location: vaginal introitis. The rash is characterized by itchiness and redness. She was exposed to nothing. Associated symptoms include fatigue. Pertinent negatives include no anorexia, cough, diarrhea, fever, joint pain, shortness of breath or sore throat. Treatments tried: nystatin crema. The treatment provided mild relief.    Hypothyroidism Recent recurrence of symptoms and will resume levothyroxin 50 mcgm q day/will rechek Tsh in 6 - 8 weeks.   Past Medical History:  Diagnosis Date  . Anxiety    . Arthritis    thumbs, hands  . Cancer [facility]) 1991   bilateral breast  . Depression   . GERD (gastroesophageal reflux disease)   . Headache    occasional - pinched nerve in neck  . Hypertension   . Hypothyroidism   . Thyroid disease   . Wears dentures    full upper, partial lower    Past Surgical History:  Procedure Laterality Date  . BREAST SURGERY Bilateral 1991   mastectomy  . CATARACT EXTRACTION Bilateral   . CHOLECYSTECTOMY    . COLONOSCOPY  2013   Dr Gustavo Lah  . HALLUX VALGUS AUSTIN Right 06/03/2015   Procedure: HALLUX VALGUS AUSTIN;  Surgeon: Samara Deist, DPM;  Location: [place];  Service: Podiatry;  Laterality: Right;  WITH POPLITEAL  . KNEE ARTHROSCOPY WITH MEDIAL MENISECTOMY Right 08/30/2016   Procedure: KNEE ARTHROSCOPY WITH PARTIAL MEDIAL MENISECTOMY and Partial Lateral Menisectomy;  Surgeon: Hessie Knows, MD;  Location: [facility];  Service: Orthopedics;  Laterality: Right;  . KNEE ARTHROSCOPY WITH SUBCHONDROPLASTY Right 08/30/2016   Procedure: KNEE ARTHROSCOPY WITH SUBCHONDROPLASTY;  Surgeon: Hessie Knows, MD;  Location: [facility];  Service: Orthopedics;  Laterality: Right;  . MASTECTOMY Bilateral   . ROTATOR CUFF REPAIR Bilateral   . TONSILLECTOMY    . TOTAL KNEE ARTHROPLASTY Left   . TOTAL KNEE ARTHROPLASTY Right 05/30/2017   Procedure: TOTAL KNEE ARTHROPLASTY;  Surgeon: Hessie Knows, MD;  Location: [facility];  Service: Orthopedics;  Laterality: Right;  Marland Kitchen VAGINAL HYSTERECTOMY      Family History  Problem Relation Age  of Onset  . Pulmonary embolism Mother   . Healthy Father     Social History   Socioeconomic History  . Marital status: Soil scientist    Spouse name: Not on file  . Number of children: 1  . Years of education: Not on file  . Highest education level: 12th grade  Occupational History  . Occupation: Retired  Scientific laboratory technician  . Financial resource strain: Not hard at all  . Food insecurity:    Worry: Never true    Inability:  Never true  . Transportation needs:    Medical: No    Non-medical: No  Tobacco Use  . Smoking status: Former Smoker    Packs/day: 0.25    Years: 10.00    Pack years: 2.50    Types: Cigarettes    Last attempt to quit: 1991    Years since quitting: 28.4  . Smokeless tobacco: Never Used  . Tobacco comment: smoking cessation materials not required  Substance and Sexual Activity  . Alcohol use: No    Alcohol/week: 0.0 oz  . Drug use: No  . Sexual activity: Not Currently  Lifestyle  . Physical activity:    Days per week: 2 days    Minutes per session: 60 min  . Stress: Not at all  Relationships  . Social connections:    Talks on phone: Patient refused    Gets together: Patient refused    Attends religious service: Patient refused    Active member of club or organization: Patient refused    Attends meetings of clubs or organizations: Patient refused    Relationship status: Patient refused  . Intimate partner violence:    Fear of current or ex partner: No    Emotionally abused: No    Physically abused: No    Forced sexual activity: No  Other Topics Concern  . Not on file  Social History Narrative  . Not on file    Allergies  Allergen Reactions  . Adhesive [Tape] Dermatitis    Can not use plastic tape. Paper tape ok   . Penicillin G Itching    Has patient had a PCN reaction causing immediate rash, facial/tongue/throat swelling, SOB or lightheadedness with hypotension: No Has patient had a PCN reaction causing severe rash involving mucus membranes or skin necrosis: No Has patient had a PCN reaction that required hospitalization: No Has patient had a PCN reaction occurring within the last 10 years: No If all of the above answers are "NO", then may proceed with Cephalosporin use.   Marland Kitchen Hydrocodone-Acetaminophen Itching and Rash    Outpatient Medications Prior to Visit  Medication Sig Dispense Refill  . acetaminophen (TYLENOL) 325 MG tablet Take 1-2 tablets (325-650 mg  total) by mouth every 6 (six) hours as needed for mild pain (pain score 1-3 or temp > 100.5).    . busPIRone (BUSPAR) 5 MG tablet Take 1 tablet (5 mg total) by mouth 2 (two) times daily. (Patient taking differently: Take 5 mg by mouth daily as needed (anxiety). ) 180 tablet 1  . chlorthalidone (HYGROTON) 25 MG tablet Take 1 tablet (25 mg total) by mouth daily. 90 tablet 2  . omeprazole (PRILOSEC) 20 MG capsule Take 1 capsule (20 mg total) by mouth daily. 90 capsule 2  . Probiotic CAPS Take 1 capsule by mouth daily as needed (diarrhea).    . triamcinolone (NASACORT ALLERGY 24HR) 55 MCG/ACT AERO nasal inhaler Place 2 sprays into the nose daily. (Patient taking differently: Place 2 sprays  into the nose daily as needed (allergies). ) 1 Inhaler 11  . azithromycin (ZITHROMAX) 250 MG tablet 2 today then 1 a day for 4 days 6 tablet 0  . HYDROcodone-acetaminophen (NORCO) 7.5-325 MG tablet Take 1-2 tablets by mouth every 4 (four) hours as needed for severe pain (pain score 7-10). 40 tablet 0   No facility-administered medications prior to visit.     Review of Systems  Constitutional: Positive for fatigue. Negative for chills, diaphoresis, fever, malaise/fatigue, weight gain and weight loss.  HENT: Negative for ear discharge, ear pain, hoarse voice, sore throat and trouble swallowing.   Eyes: Negative for blurred vision and photophobia.  Respiratory: Negative for cough, sputum production, shortness of breath and wheezing.   Cardiovascular: Negative for chest pain, palpitations, leg swelling and syncope.  Gastrointestinal: Negative for abdominal pain, anorexia, blood in stool, constipation, diarrhea, heartburn, melena and nausea.  Genitourinary: Negative for dysuria, frequency, hematuria and urgency.  Musculoskeletal: Positive for neck pain. Negative for back pain, joint pain and myalgias.  Skin: Positive for rash.  Neurological: Negative for dizziness, tingling, tremors, sensory change, focal weakness,  weakness, numbness and headaches.  Endo/Heme/Allergies: Positive for cold intolerance and heat intolerance. Negative for environmental allergies and polydipsia. Does not bruise/bleed easily.  Psychiatric/Behavioral: Negative for depression and suicidal ideas. The patient is not nervous/anxious and does not have insomnia.      Objective  Vitals:   08/15/17 1015  BP: 130/80  Pulse: 72  Weight: 209 lb (94.8 kg)  Height: 5\' 7"  (1.702 m)    Physical Exam  Constitutional: No distress.  HENT:  Head: Normocephalic and atraumatic.  Right Ear: External ear normal.  Left Ear: External ear normal.  Nose: Nose normal.  Mouth/Throat: Oropharynx is clear and moist.  Eyes: Pupils are equal, round, and reactive to light. Conjunctivae and EOM are normal. Right eye exhibits no discharge. Left eye exhibits no discharge.  Neck: Normal range of motion. Neck supple. No JVD present. No thyromegaly present.  Cardiovascular: Normal rate, regular rhythm, normal heart sounds and intact distal pulses. Exam reveals no gallop and no friction rub.  No murmur heard. Pulmonary/Chest: Effort normal and breath sounds normal.  Abdominal: Soft. Bowel sounds are normal. She exhibits no mass. There is no tenderness. There is no guarding.  Musculoskeletal: Normal range of motion. She exhibits no edema.  Lymphadenopathy:    She has no cervical adenopathy.  Neurological: She is alert. She has normal reflexes.  Skin: Skin is warm and dry. She is not diaphoretic.  Nursing note and vitals reviewed.     Assessment & Plan  Problem List Items Addressed This Visit      Endocrine   Hypothyroidism    Recent recurrence of symptoms and will resume levothyroxin 50 mcgm q day/will rechek Tsh in 6 - 8 weeks.      Relevant Medications   levothyroxine (SYNTHROID, LEVOTHROID) 50 MCG tablet    Other Visit Diagnoses    Cervical arthritis    -  Primary   Patient with recurrent neck pain. Will refill celebrex.   Relevant  Medications   celecoxib (CELEBREX) 100 MG capsule   Yeast infection involving the vagina and surrounding area       Recent exacerbation only partially clear with nystat cream. will prescribe diflucan 150 mg.   Relevant Medications   fluconazole (DIFLUCAN) 150 MG tablet   nystatin cream (MYCOSTATIN)   Oral thrush       Dentist treated with nystatin oral suspension. Dilucan added,  Relevant Medications   fluconazole (DIFLUCAN) 150 MG tablet   nystatin cream (MYCOSTATIN)      Meds ordered this encounter  Medications  . fluconazole (DIFLUCAN) 150 MG tablet    Sig: Take 1 tablet (150 mg total) by mouth once for 1 dose.    Dispense:  1 tablet    Refill:  0  . nystatin cream (MYCOSTATIN)    Sig: Apply 1 application topically 2 (two) times daily.    Dispense:  30 g    Refill:  0  . levothyroxine (SYNTHROID, LEVOTHROID) 50 MCG tablet    Sig: Take 1 tablet (50 mcg total) by mouth daily.    Dispense:  90 tablet    Refill:  3  . celecoxib (CELEBREX) 100 MG capsule    Sig: Take 1 capsule (100 mg total) by mouth daily.    Dispense:  30 capsule    Refill:  1      Dr. Otilio Miu [facility]  08/15/17

## 2017-08-15 NOTE — Assessment & Plan Note (Signed)
Recent recurrence of symptoms and will resume levothyroxin 50 mcgm q day/will rechek Tsh in 6 - 8 weeks.

## 2017-08-28 DIAGNOSIS — H66002 Acute suppurative otitis media without spontaneous rupture of ear drum, left ear: Secondary | ICD-10-CM | POA: Diagnosis not present

## 2017-08-28 DIAGNOSIS — R05 Cough: Secondary | ICD-10-CM | POA: Diagnosis not present

## 2017-09-05 ENCOUNTER — Ambulatory Visit
Admission: RE | Admit: 2017-09-05 | Discharge: 2017-09-05 | Disposition: A | Discharge status: Home / Self Care | Payer: Medicare HMO | Source: Ambulatory Visit | Attending: Family Medicine | Admitting: Family Medicine

## 2017-09-05 ENCOUNTER — Ambulatory Visit (INDEPENDENT_AMBULATORY_CARE_PROVIDER_SITE_OTHER): Payer: Medicare HMO | Admitting: Family Medicine

## 2017-09-05 ENCOUNTER — Encounter: Payer: Self-pay | Admitting: Family Medicine

## 2017-09-05 VITALS — BP 118/78 | HR 58 | Resp 16 | Ht 67.0 in | Wt 203.0 lb

## 2017-09-05 DIAGNOSIS — R197 Diarrhea, unspecified: Secondary | ICD-10-CM

## 2017-09-05 DIAGNOSIS — R05 Cough: Secondary | ICD-10-CM

## 2017-09-05 DIAGNOSIS — R51 Headache: Secondary | ICD-10-CM

## 2017-09-05 DIAGNOSIS — R059 Cough, unspecified: Secondary | ICD-10-CM

## 2017-09-05 DIAGNOSIS — R519 Headache, unspecified: Secondary | ICD-10-CM

## 2017-09-05 DIAGNOSIS — J3489 Other specified disorders of nose and nasal sinuses: Secondary | ICD-10-CM | POA: Diagnosis not present

## 2017-09-05 NOTE — Progress Notes (Signed)
Name: Kaitlyn Oliver   MRN: [id number]    DOB: [birth year]   Date:09/05/2017       Progress Note  Subjective  Chief Complaint  Chief Complaint  Patient presents with  . Cough    2 weeks of cougha nd headache- Zpack did not help and nothing is working. Productive cough.     Cough  This is a new problem. The current episode started 1 to 4 weeks ago (2 week). The problem occurs constantly. The cough is productive of purulent sputum (yellow green). Associated symptoms include ear pain, headaches, nasal congestion, postnasal drip, weight loss and wheezing. Pertinent negatives include no chest pain, chills, ear congestion, fever, heartburn, hemoptysis, myalgias, rash, rhinorrhea, sore throat, shortness of breath or sweats. Associated symptoms comments: Left facial pain. Exacerbated by: Must mulch. Treatments tried: azith x 2/one at [employer]. The treatment provided mild relief. There is no history of asthma, bronchiectasis, bronchitis, COPD, emphysema, environmental allergies or pneumonia.  Sinusitis  This is a chronic problem. The current episode started more than 1 year ago. The problem is unchanged. There has been no fever. Her pain is at a severity of 3/10. The pain is moderate. Associated symptoms include coughing, ear pain and headaches. Pertinent negatives include no chills, congestion, diaphoresis, hoarse voice, neck pain, shortness of breath, sinus pressure, sneezing, sore throat or swollen glands. Past treatments include oral decongestants (azith).  Diarrhea   This is a new problem. The current episode started 1 to 4 weeks ago. The problem occurs 5 to 10 times per day. The stool consistency is described as mucous and watery. The patient states that diarrhea awakens her from sleep. Associated symptoms include coughing, headaches and weight loss. Pertinent negatives include no abdominal pain, arthralgias, bloating, chills, fever, increased  flatus, myalgias, sweats, URI or vomiting. Associated  symptoms comments: nausea. She has tried anti-motility drug for the symptoms. The treatment provided moderate relief.    No problem-specific Assessment & Plan notes found for this encounter.   Past Medical History:  Diagnosis Date  . Anxiety   . Arthritis    thumbs, hands  . Cancer [facility]) 1991   bilateral breast  . Depression   . GERD (gastroesophageal reflux disease)   . Headache    occasional - pinched nerve in neck  . Hypertension   . Hypothyroidism   . Thyroid disease   . Wears dentures    full upper, partial lower    Past Surgical History:  Procedure Laterality Date  . BREAST SURGERY Bilateral 1991   mastectomy  . CATARACT EXTRACTION Bilateral   . CHOLECYSTECTOMY    . COLONOSCOPY  2013   Dr Gustavo Lah  . HALLUX VALGUS AUSTIN Right 06/03/2015   Procedure: HALLUX VALGUS AUSTIN;  Surgeon: Samara Deist, DPM;  Location: [place];  Service: Podiatry;  Laterality: Right;  WITH POPLITEAL  . KNEE ARTHROSCOPY WITH MEDIAL MENISECTOMY Right 08/30/2016   Procedure: KNEE ARTHROSCOPY WITH PARTIAL MEDIAL MENISECTOMY and Partial Lateral Menisectomy;  Surgeon: Hessie Knows, MD;  Location: [facility];  Service: Orthopedics;  Laterality: Right;  . KNEE ARTHROSCOPY WITH SUBCHONDROPLASTY Right 08/30/2016   Procedure: KNEE ARTHROSCOPY WITH SUBCHONDROPLASTY;  Surgeon: Hessie Knows, MD;  Location: [facility];  Service: Orthopedics;  Laterality: Right;  . MASTECTOMY Bilateral   . ROTATOR CUFF REPAIR Bilateral   . TONSILLECTOMY    . TOTAL KNEE ARTHROPLASTY Left   . TOTAL KNEE ARTHROPLASTY Right 05/30/2017   Procedure: TOTAL KNEE ARTHROPLASTY;  Surgeon: Hessie Knows, MD;  [facility];  Service: Orthopedics;  Laterality: Right;  Marland Kitchen VAGINAL HYSTERECTOMY      Family History  Problem Relation Age of Onset  . Pulmonary embolism Mother   . Healthy Father     Social History   Socioeconomic History  . Marital status: Soil scientist    Spouse name: Not on file  . Number of  children: 1  . Years of education: Not on file  . Highest education level: 12th grade  Occupational History  . Occupation: Retired  Scientific laboratory technician  . Financial resource strain: Not hard at all  . Food insecurity:    Worry: Never true    Inability: Never true  . Transportation needs:    Medical: No    Non-medical: No  Tobacco Use  . Smoking status: Former Smoker    Packs/day: 0.25    Years: 10.00    Pack years: 2.50    Types: Cigarettes    Last attempt to quit: 1991    Years since quitting: 28.4  . Smokeless tobacco: Never Used  . Tobacco comment: smoking cessation materials not required  Substance and Sexual Activity  . Alcohol use: No    Alcohol/week: 0.0 oz  . Drug use: No  . Sexual activity: Not Currently  Lifestyle  . Physical activity:    Days per week: 2 days    Minutes per session: 60 min  . Stress: Not at all  Relationships  . Social connections:    Talks on phone: Patient refused    Gets together: Patient refused    Attends religious service: Patient refused    Active member of club or organization: Patient refused    Attends meetings of clubs or organizations: Patient refused    Relationship status: Patient refused  . Intimate partner violence:    Fear of current or ex partner: No    Emotionally abused: No    Physically abused: No    Forced sexual activity: No  Other Topics Concern  . Not on file  Social History Narrative  . Not on file    Allergies  Allergen Reactions  . Adhesive [Tape] Dermatitis    Can not use plastic tape. Paper tape ok   . Penicillin G Itching    Has patient had a PCN reaction causing immediate rash, facial/tongue/throat swelling, SOB or lightheadedness with hypotension: No Has patient had a PCN reaction causing severe rash involving mucus membranes or skin necrosis: No Has patient had a PCN reaction that required hospitalization: No Has patient had a PCN reaction occurring within the last 10 years: No If all of the above  answers are "NO", then may proceed with Cephalosporin use.   Marland Kitchen Hydrocodone-Acetaminophen Itching and Rash    Outpatient Medications Prior to Visit  Medication Sig Dispense Refill  . acetaminophen (TYLENOL) 325 MG tablet Take 1-2 tablets (325-650 mg total) by mouth every 6 (six) hours as needed for mild pain (pain score 1-3 or temp > 100.5).    . busPIRone (BUSPAR) 5 MG tablet Take 1 tablet (5 mg total) by mouth 2 (two) times daily. (Patient taking differently: Take 5 mg by mouth daily as needed (anxiety). ) 180 tablet 1  . celecoxib (CELEBREX) 100 MG capsule Take 1 capsule (100 mg total) by mouth daily. 30 capsule 1  . chlorthalidone (HYGROTON) 25 MG tablet Take 1 tablet (25 mg total) by mouth daily. 90 tablet 2  . levothyroxine (SYNTHROID, LEVOTHROID) 50 MCG tablet Take 1 tablet (50 mcg total) by mouth daily.  90 tablet 3  . nystatin cream (MYCOSTATIN) Apply 1 application topically 2 (two) times daily. 30 g 0  . omeprazole (PRILOSEC) 20 MG capsule Take 1 capsule (20 mg total) by mouth daily. 90 capsule 2  . Probiotic CAPS Take 1 capsule by mouth daily as needed (diarrhea).    . triamcinolone (NASACORT ALLERGY 24HR) 55 MCG/ACT AERO nasal inhaler Place 2 sprays into the nose daily. (Patient taking differently: Place 2 sprays into the nose daily as needed (allergies). ) 1 Inhaler 11   No facility-administered medications prior to visit.     Review of Systems  Constitutional: Positive for weight loss. Negative for chills, diaphoresis, fever and malaise/fatigue.  HENT: Positive for ear pain and postnasal drip. Negative for congestion, ear discharge, hoarse voice, rhinorrhea, sinus pressure, sneezing and sore throat.   Eyes: Negative for blurred vision.  Respiratory: Positive for cough and wheezing. Negative for hemoptysis, sputum production and shortness of breath.   Cardiovascular: Negative for chest pain, palpitations and leg swelling.  Gastrointestinal: Positive for diarrhea. Negative for  abdominal pain, bloating, blood in stool, constipation, flatus, heartburn, melena, nausea and vomiting.  Genitourinary: Negative for dysuria, frequency, hematuria and urgency.  Musculoskeletal: Negative for arthralgias, back pain, joint pain, myalgias and neck pain.  Skin: Negative for rash.  Neurological: Positive for headaches. Negative for dizziness, tingling, sensory change and focal weakness.  Endo/Heme/Allergies: Negative for environmental allergies and polydipsia. Does not bruise/bleed easily.  Psychiatric/Behavioral: Negative for depression and suicidal ideas. The patient is not nervous/anxious and does not have insomnia.      Objective  Vitals:   09/05/17 0924  BP: 118/78  Pulse: (!) 58  Resp: 16  SpO2: 98%  Weight: 203 lb (92.1 kg)  Height: 5\' 7"  (1.702 m)    Physical Exam  Constitutional: She is oriented to person, place, and time. She appears well-developed and well-nourished.  HENT:  Head: Normocephalic.  Right Ear: External ear normal.  Left Ear: External ear normal.  Mouth/Throat: Oropharynx is clear and moist.  Eyes: Pupils are equal, round, and reactive to light. Conjunctivae and EOM are normal. Lids are everted and swept, no foreign bodies found. Left eye exhibits no hordeolum. No foreign body present in the left eye. Right conjunctiva is not injected. Left conjunctiva is not injected. No scleral icterus.  Neck: Normal range of motion. Neck supple. No JVD present. No tracheal deviation present. No thyromegaly present.  Cardiovascular: Normal rate, regular rhythm, normal heart sounds and intact distal pulses. Exam reveals no gallop and no friction rub.  No murmur heard. Pulmonary/Chest: Effort normal and breath sounds normal. No respiratory distress. She has no wheezes. She has no rales.  Abdominal: Soft. Bowel sounds are normal. She exhibits no mass. There is no hepatosplenomegaly. There is no tenderness. There is no rebound and no guarding.  Musculoskeletal:  Normal range of motion. She exhibits no edema or tenderness.  Lymphadenopathy:    She has no cervical adenopathy.  Neurological: She is alert and oriented to person, place, and time. She has normal strength. She displays normal reflexes. No cranial nerve deficit.  Skin: Skin is warm. No rash noted.  Psychiatric: She has a normal mood and affect. Her mood appears not anxious. She does not exhibit a depressed mood.  Nursing note and vitals reviewed.     Assessment & Plan  Problem List Items Addressed This Visit    None    Visit Diagnoses    Facial pain, acute    -  Primary  left facial pain /upper respirtory congestion/ sinus x ray   Relevant Orders   DG Sinuses Complete   Cough       Recurrent cough/Fine rales in left posterior lobe/ chest xray   Relevant Orders   DG Chest 2 View   Diarrhea, unspecified type       On multiple couurses of azith /will check c. diff/cont imodium   Relevant Orders   Stool C-Diff Toxin Assay      No orders of the defined types were placed in this encounter.     Dr. [facility]  09/05/17

## 2017-09-06 ENCOUNTER — Other Ambulatory Visit: Payer: Self-pay

## 2017-09-06 DIAGNOSIS — K279 Peptic ulcer, site unspecified, unspecified as acute or chronic, without hemorrhage or perforation: Secondary | ICD-10-CM

## 2017-09-06 DIAGNOSIS — R197 Diarrhea, unspecified: Secondary | ICD-10-CM | POA: Diagnosis not present

## 2017-09-06 MED ORDER — OMEPRAZOLE 20 MG PO CPDR: 20.0000 mg | DELAYED_RELEASE_CAPSULE | Freq: Every day | ORAL | 2 refills | Status: DC

## 2017-09-06 MED ORDER — MONTELUKAST SODIUM 10 MG PO TABS: 10.0000 mg | ORAL_TABLET | Freq: Every day | ORAL | 3 refills | Status: DC

## 2017-09-08 LAB — CLOSTRIDIUM DIFFICILE EIA: C DIFFICILE TOXINS A+ B, EIA: NEGATIVE

## 2017-10-03 ENCOUNTER — Other Ambulatory Visit: Payer: Self-pay

## 2017-10-03 DIAGNOSIS — E039 Hypothyroidism, unspecified: Secondary | ICD-10-CM

## 2017-10-04 LAB — TSH: TSH: 4.22 u[IU]/mL (ref 0.450–4.500)

## 2017-10-09 ENCOUNTER — Other Ambulatory Visit: Payer: Self-pay

## 2017-11-05 DIAGNOSIS — R6884 Jaw pain: Secondary | ICD-10-CM | POA: Diagnosis not present

## 2017-11-06 ENCOUNTER — Emergency Department: Payer: Medicare HMO

## 2017-11-06 ENCOUNTER — Other Ambulatory Visit: Payer: Self-pay

## 2017-11-06 ENCOUNTER — Encounter: Payer: Self-pay | Admitting: *Deleted

## 2017-11-06 ENCOUNTER — Emergency Department
Admission: EM | Admit: 2017-11-06 | Discharge: 2017-11-06 | Disposition: A | Discharge status: Home / Self Care | Payer: Medicare HMO | Attending: Emergency Medicine | Admitting: Emergency Medicine

## 2017-11-06 DIAGNOSIS — M542 Cervicalgia: Secondary | ICD-10-CM | POA: Diagnosis present

## 2017-11-06 DIAGNOSIS — R079 Chest pain, unspecified: Secondary | ICD-10-CM | POA: Insufficient documentation

## 2017-11-06 DIAGNOSIS — Z79899 Other long term (current) drug therapy: Secondary | ICD-10-CM | POA: Diagnosis not present

## 2017-11-06 DIAGNOSIS — Z87891 Personal history of nicotine dependence: Secondary | ICD-10-CM | POA: Insufficient documentation

## 2017-11-06 DIAGNOSIS — I712 Thoracic aortic aneurysm, without rupture, unspecified: Secondary | ICD-10-CM

## 2017-11-06 DIAGNOSIS — I10 Essential (primary) hypertension: Secondary | ICD-10-CM | POA: Insufficient documentation

## 2017-11-06 DIAGNOSIS — E039 Hypothyroidism, unspecified: Secondary | ICD-10-CM | POA: Diagnosis not present

## 2017-11-06 LAB — CBC
HCT: 36.3 % (ref 35.0–47.0)
HEMOGLOBIN: 12.7 g/dL (ref 12.0–16.0)
MCH: 31.2 pg (ref 26.0–34.0)
MCHC: 34.9 g/dL (ref 32.0–36.0)
MCV: 89.5 fL (ref 80.0–100.0)
PLATELETS: 155 10*3/uL (ref 150–440)
RBC: 4.05 MIL/uL (ref 3.80–5.20)
RDW: 15.9 % — ABNORMAL HIGH (ref 11.5–14.5)
WBC: 4.8 10*3/uL (ref 3.6–11.0)

## 2017-11-06 LAB — BASIC METABOLIC PANEL
ANION GAP: 7 (ref 5–15)
BUN: 16 mg/dL (ref 8–23)
CALCIUM: 9.4 mg/dL (ref 8.9–10.3)
CO2: 25 mmol/L (ref 22–32)
CREATININE: 0.89 mg/dL (ref 0.44–1.00)
Chloride: 105 mmol/L (ref 98–111)
GFR, EST NON AFRICAN AMERICAN: 59 mL/min — AB (ref >60)
GLUCOSE: 106 mg/dL — AB (ref 70–99)
Potassium: 3.1 mmol/L — ABNORMAL LOW (ref 3.5–5.1)
Sodium: 137 mmol/L (ref 135–145)

## 2017-11-06 LAB — TROPONIN I

## 2017-11-06 MED ORDER — IOPAMIDOL (ISOVUE-370) INJECTION 76%
75.0000 mL | Freq: Once | INTRAVENOUS | Status: AC | PRN
  Administered 2017-11-06: 75 mL via INTRAVENOUS

## 2017-11-06 NOTE — ED Notes (Signed)
Assisted pt to the restroom. Pt given warm blanket and is resting at this time. No other needs voiced.

## 2017-11-06 NOTE — ED Provider Notes (Signed)
[facility] Emergency Department Provider Note   First MD Initiated Contact with Patient 11/06/17 0100     (approximate)  I have reviewed the triage vital signs and the nursing notes.   HISTORY  Chief Complaint Chest Pain    HPI Kaitlyn Oliver is a 84 y.o. female with below list of chronic medical conditions including GERD hypertension breast cancer presents to the emergency department with acute onset of left neck pain with radiation to the chest which began this morning.  Patient denies any discomfort at present stating that it resolved while in route.  Patient states however pain was considerable on EMS arrival.  Patient does admit to bilateral lower extremity swelling right more than left.  Past Medical History:  Diagnosis Date  . Anxiety   . Arthritis    thumbs, hands  . Cancer [facility]) 1991   bilateral breast  . Depression   . GERD (gastroesophageal reflux disease)   . Headache    occasional - pinched nerve in neck  . Hypertension   . Hypothyroidism   . Thyroid disease   . Wears dentures    full upper, partial lower    Patient Active Problem List   Diagnosis Date Noted  . Status post total knee replacement using cement, right 05/30/2017  . Gastroesophageal reflux disease 12/22/2015  . Adenomatous polyp 09/11/2014  . Bilateral cataracts 09/11/2014  . Gastric catarrh 09/11/2014  . Gastroduodenal ulcer 09/11/2014  . Hypothyroidism 09/11/2014  . Cardiac murmur 10/08/2013  . Essential (primary) hypertension 10/08/2013  . Combined fat and carbohydrate induced hyperlipemia 10/08/2013    Past Surgical History:  Procedure Laterality Date  . BREAST SURGERY Bilateral 1991   mastectomy  . CATARACT EXTRACTION Bilateral   . CHOLECYSTECTOMY    . COLONOSCOPY  2013   Dr Gustavo Lah  . HALLUX VALGUS AUSTIN Right 06/03/2015   Procedure: HALLUX VALGUS AUSTIN;  Surgeon: Samara Deist, DPM;  Location: [place];  Service: Podiatry;   Laterality: Right;  WITH POPLITEAL  . KNEE ARTHROSCOPY WITH MEDIAL MENISECTOMY Right 08/30/2016   Procedure: KNEE ARTHROSCOPY WITH PARTIAL MEDIAL MENISECTOMY and Partial Lateral Menisectomy;  Surgeon: Hessie Knows, MD;  Location: [facility]S;  Service: Orthopedics;  Laterality: Right;  . KNEE ARTHROSCOPY WITH SUBCHONDROPLASTY Right 08/30/2016   Procedure: KNEE ARTHROSCOPY WITH SUBCHONDROPLASTY;  Surgeon: Hessie Knows, MD;  Location: [facility];  Service: Orthopedics;  Laterality: Right;  . MASTECTOMY Bilateral   . ROTATOR CUFF REPAIR Bilateral   . TONSILLECTOMY    . TOTAL KNEE ARTHROPLASTY Left   . TOTAL KNEE ARTHROPLASTY Right 05/30/2017   Procedure: TOTAL KNEE ARTHROPLASTY;  Surgeon: Hessie Knows, MD;  Location: [facility];  Service: Orthopedics;  Laterality: Right;  Marland Kitchen VAGINAL HYSTERECTOMY      Prior to Admission medications   Medication Sig Start Date End Date Taking? Authorizing Provider  acetaminophen (TYLENOL) 325 MG tablet Take 1-2 tablets (325-650 mg total) by mouth every 6 (six) hours as needed for mild pain (pain score 1-3 or temp > 100.5). 06/01/17   Duanne Guess, PA-C  busPIRone (BUSPAR) 5 MG tablet Take 1 tablet (5 mg total) by mouth 2 (two) times daily. Patient taking differently: Take 5 mg by mouth daily as needed (anxiety).  12/22/15   Juline Patch, MD  celecoxib (CELEBREX) 100 MG capsule Take 1 capsule (100 mg total) by mouth daily. 08/15/17   Juline Patch, MD  chlorthalidone (HYGROTON) 25 MG tablet Take 1 tablet (25 mg total) by mouth  daily. 02/15/17   Juline Patch, MD  levothyroxine (SYNTHROID, LEVOTHROID) 50 MCG tablet Take 1 tablet (50 mcg total) by mouth daily. 08/15/17   Juline Patch, MD  montelukast (SINGULAIR) 10 MG tablet Take 1 tablet (10 mg total) by mouth at bedtime. 09/06/17   Juline Patch, MD  nystatin cream (MYCOSTATIN) Apply 1 application topically 2 (two) times daily. 08/15/17   Juline Patch, MD  omeprazole (PRILOSEC) 20 MG capsule Take 1 capsule (20 mg  total) by mouth daily. 09/06/17   Juline Patch, MD  Probiotic CAPS Take 1 capsule by mouth daily as needed (diarrhea).    [provider]  triamcinolone (NASACORT ALLERGY 24HR) 55 MCG/ACT AERO nasal inhaler Place 2 sprays into the nose daily. Patient taking differently: Place 2 sprays into the nose daily as needed (allergies).  12/22/15   Juline Patch, MD    Allergies Adhesive [tape]; Penicillin g; and Hydrocodone-acetaminophen  Family History  Problem Relation Age of Onset  . Pulmonary embolism Mother   . Healthy Father     Social History Social History   Tobacco Use  . Smoking status: Former Smoker    Packs/day: 0.25    Years: 10.00    Pack years: 2.50    Types: Cigarettes    Last attempt to quit: 1991    Years since quitting: 28.6  . Smokeless tobacco: Never Used  . Tobacco comment: smoking cessation materials not required  Substance Use Topics  . Alcohol use: No    Alcohol/week: 0.0 standard drinks  . Drug use: No    Review of Systems Constitutional: No fever/chills Eyes: No visual changes. ENT: No sore throat.  Positive for neck pain Cardiovascular: Positive for chest pain chest pain. Respiratory: Denies shortness of breath. Gastrointestinal: No abdominal pain.  No nausea, no vomiting.  No diarrhea.  No constipation. Genitourinary: Negative for dysuria. Musculoskeletal: Negative for neck pain.  Negative for back pain. Integumentary: Negative for rash. Neurological: Negative for headaches, focal weakness or numbness.   ____________________________________________   PHYSICAL EXAM:  VITAL SIGNS: ED Triage Vitals  Enc Vitals Group     BP 11/06/17 0102 (!) 189/88     Pulse Rate 11/06/17 0055 (!) 59     Resp 11/06/17 0055 17     Temp 11/06/17 0055 97.8 F (36.6 C)     Temp Source 11/06/17 0055 Oral     SpO2 11/06/17 0055 96 %     Weight 11/06/17 0058 90.7 kg (200 lb)     Height 11/06/17 0058 1.651 m (5\' 5" )     Head Circumference --       Peak Flow --      Pain Score 11/06/17 0058 2     Pain Loc --      Pain Edu? --      Excl. in [place]? --      Constitutional: Alert and oriented. Well appearing and in no acute distress. Eyes: Conjunctivae are normal. Head: Atraumatic. Mouth/Throat: Mucous membranes are moist.  Oropharynx non-erythematous. Neck: No stridor.  Cardiovascular: Normal rate, regular rhythm. Good peripheral circulation. Grossly normal heart sounds. Respiratory: Normal respiratory effort.  No retractions. Lungs CTAB. Gastrointestinal: Soft and nontender. No distention.  Musculoskeletal: No lower extremity tenderness nor edema. No gross deformities of extremities. Neurologic:  Normal speech and language. No gross focal neurologic deficits are appreciated.  Skin:  Skin is warm, dry and intact. No rash noted. Psychiatric: Mood and affect are normal. Speech and behavior are  normal.  ____________________________________________   LABS (all labs ordered are listed, but only abnormal results are displayed)  Labs Reviewed  BASIC METABOLIC PANEL - Abnormal; Notable for the following components:      Result Value   Potassium 3.1 (*)    Glucose, Bld 106 (*)    GFR calc non Af Amer 59 (*)    All other components within normal limits  CBC - Abnormal; Notable for the following components:   RDW 15.9 (*)    All other components within normal limits  TROPONIN I  TROPONIN I   ____________________________________________  EKG  ED ECG REPORT I, [place] N BROWN, the attending physician, personally viewed and interpreted this ECG.   Date: 11/06/2017  EKG Time: 1:05 AM  Rate: *54  Rhythm: Normal sinus rhythm  Axis: Normal  Intervals: Normal  ST&T Change: None  ____________________________________________  RADIOLOGY I, [place] N BROWN, personally viewed and evaluated these images (plain radiographs) as part of my medical decision making, as well as reviewing the written report by the radiologist.  ED MD  interpretation: No acute findings noted on CT CT angiogram of the chest.  Noted 4.3 cm thoracic aortic aneurysm and pulmonary nodules  Official radiology report(s): Ct Angio Chest Pe W And/or Wo Contrast  Result Date: 11/06/2017 CLINICAL DATA:  84 y/o  F; chest pain. EXAM: CT ANGIOGRAPHY CHEST WITH CONTRAST TECHNIQUE: Multidetector CT imaging of the chest was performed using the standard protocol during bolus administration of intravenous contrast. Multiplanar CT image reconstructions and MIPs were obtained to evaluate the vascular anatomy. CONTRAST:  78mL ISOVUE-370 IOPAMIDOL (ISOVUE-370) INJECTION 76% COMPARISON:  11/06/2017 chest radiograph FINDINGS: Cardiovascular: 4.3 cm ascending aortic aneurysm. Mild aortic calcific atherosclerosis. Normal heart size. No pericardial effusion. Severe coronary artery calcific atherosclerosis. Mitral annular calcifications. Satisfactory opacification of the pulmonary arteries. No pulmonary embolus identified. Mediastinum/Nodes: No enlarged mediastinal, hilar, or axillary lymph nodes. Thyroid gland, trachea, and esophagus demonstrate no significant findings. Lungs/Pleura: There several scattered pulmonary nodules throughout the lungs in the right upper lobe (series 6, image 26), in the right lower lobe (series 6 image 42 and 48), left upper lobe (series 6, image 50), and left lower lobe (series 6, image 55). The left lower lobe nodules the largest measuring 7 x 6 mm. Upper Abdomen: Lobulated contour to the liver may represent cirrhosis. Scattered calcified granulomata in the liver. Cholecystectomy. Musculoskeletal: No chest wall abnormality. No acute or significant osseous findings. Review of the MIP images confirms the above findings. IMPRESSION: 1. No pulmonary embolus identified. 2. Scattered pulmonary nodules measuring up to 7 mm in the left lower lobe. Non-contrast chest CT at 3-6 months is recommended. If the nodules are stable at time of repeat CT, then future CT at  18-24 months (from today's scan) is considered optional for low-risk patients, but is recommended for high-risk patients. This recommendation follows the consensus statement: Guidelines for Management of Incidental Pulmonary Nodules Detected on CT Images: From the Fleischner Society 2017; Radiology 2017; 284:228-243. 3. 4.3 cm ascending aortic aneurysm. Recommend annual imaging followup by CTA or MRA. This recommendation follows 2010 ACCF/AHA/AATS/ACR/ASA/SCA/SCAI/SIR/STS/SVM Guidelines for the Diagnosis and Management of Patients with Thoracic Aortic Disease. Circulation. 2010; 121: I338-S505. 4. Aortic and coronary artery calcific atherosclerosis. 5. Lobulated liver contour may represent cirrhosis. Electronically Signed   By: Kristine Garbe M.D.   On: 11/06/2017 04:31   Dg Chest Port 1 View  Result Date: 11/06/2017 CLINICAL DATA:  Chest pain EXAM: PORTABLE CHEST 1 VIEW COMPARISON:  09/05/2017  FINDINGS: Heart is borderline enlarged. No confluent opacities, effusions or edema. No acute bony abnormality. IMPRESSION: Borderline heart size.  No active disease. Electronically Signed   By: Rolm Baptise M.D.   On: 11/06/2017 01:20     Procedures   ____________________________________________   INITIAL IMPRESSION / ASSESSMENT AND PLAN / ED COURSE  As part of my medical decision making, I reviewed the following data within the electronic MEDICAL RECORD NUMBER   84 year old female presenting with above-stated history and physical exam of neck pain which radiated to the patient's chest which resolved before my evaluation.  Considered possibility of ACS and as such EKG was performed which revealed no evidence of ischemia or infarction.  In addition troponin x2 obtained which were both negative.  Also consider the possibility of pulmonary emboli CT scan of the chest performed which revealed no acute findings.  Patient stated that she was aware of her thoracic aortic aneurysm.  Patient is pain-free at  present.  Recommend the patient follow-up with Dr. Nehemiah Massed.     ____________________________________________  FINAL CLINICAL IMPRESSION(S) / ED DIAGNOSES  Final diagnoses:  Chest pain, unspecified type  Thoracic aortic aneurysm without rupture ([place])     MEDICATIONS GIVEN DURING THIS VISIT:  Medications  iopamidol (ISOVUE-370) 76 % injection 75 mL (75 mLs Intravenous Contrast Given 11/06/17 0329)     ED Discharge Orders    None       Note:  This document was prepared using Dragon voice recognition software and may include unintentional dictation errors.    Gregor Hams, MD 11/06/17 [phone]

## 2017-11-07 ENCOUNTER — Encounter: Payer: Self-pay | Admitting: Family Medicine

## 2017-11-07 ENCOUNTER — Ambulatory Visit (INDEPENDENT_AMBULATORY_CARE_PROVIDER_SITE_OTHER): Payer: Medicare HMO | Admitting: Family Medicine

## 2017-11-07 VITALS — BP 128/80 | HR 80 | Ht 65.0 in | Wt 203.0 lb

## 2017-11-07 DIAGNOSIS — I712 Thoracic aortic aneurysm, without rupture, unspecified: Secondary | ICD-10-CM

## 2017-11-07 DIAGNOSIS — R0789 Other chest pain: Secondary | ICD-10-CM | POA: Diagnosis not present

## 2017-11-07 DIAGNOSIS — R1314 Dysphagia, pharyngoesophageal phase: Secondary | ICD-10-CM | POA: Diagnosis not present

## 2017-11-07 DIAGNOSIS — R911 Solitary pulmonary nodule: Secondary | ICD-10-CM | POA: Diagnosis not present

## 2017-11-07 DIAGNOSIS — Z23 Encounter for immunization: Secondary | ICD-10-CM | POA: Diagnosis not present

## 2017-11-07 NOTE — Progress Notes (Signed)
Name: Kaitlyn Oliver   MRN: [id number]    DOB: 1933/07/31   Date:11/07/2017       Progress Note  Subjective  Chief Complaint  Chief Complaint  Patient presents with  . trouble swallowing    "feel like food gets stuck"- Aortic Aneurysm 4.3    Chest Pain   This is a recurrent problem. The current episode started more than 1 month ago (for 2 weeks). The onset quality is gradual. The problem occurs constantly (dull worsen with eating). The problem has been gradually worsening. The pain is present in the substernal region. The quality of the pain is described as squeezing (compressing). The pain radiates to the left neck and right neck. Associated symptoms include diaphoresis, exertional chest pressure, nausea and palpitations. Pertinent negatives include no abdominal pain, back pain, claudication, cough, dizziness, fever, headaches, hemoptysis, irregular heartbeat, lower extremity edema, malaise/fatigue, near-syncope, numbness, PND, shortness of breath, sputum production, syncope, vomiting or weakness. Associated symptoms comments: With eating. She has tried NSAIDs for the symptoms.  Her past medical history is significant for aortic aneurysm.    No problem-specific Assessment & Plan notes found for this encounter.   Past Medical History:  Diagnosis Date  . Anxiety   . Arthritis    thumbs, hands  . Cancer [facility]) 1991   bilateral breast  . Depression   . GERD (gastroesophageal reflux disease)   . Headache    occasional - pinched nerve in neck  . Hypertension   . Hypothyroidism   . Thyroid disease   . Wears dentures    full upper, partial lower    Past Surgical History:  Procedure Laterality Date  . BREAST SURGERY Bilateral 1991   mastectomy  . CATARACT EXTRACTION Bilateral   . CHOLECYSTECTOMY    . COLONOSCOPY  2013   Dr Gustavo Lah  . HALLUX VALGUS AUSTIN Right 06/03/2015   Procedure: HALLUX VALGUS AUSTIN;  Surgeon: Samara Deist, DPM;  Location: [place];  Service:  Podiatry;  Laterality: Right;  WITH POPLITEAL  . KNEE ARTHROSCOPY WITH MEDIAL MENISECTOMY Right 08/30/2016   Procedure: KNEE ARTHROSCOPY WITH PARTIAL MEDIAL MENISECTOMY and Partial Lateral Menisectomy;  Surgeon: Hessie Knows, MD;  Location: [facility];  Service: Orthopedics;  Laterality: Right;  . KNEE ARTHROSCOPY WITH SUBCHONDROPLASTY Right 08/30/2016   Procedure: KNEE ARTHROSCOPY WITH SUBCHONDROPLASTY;  Surgeon: Hessie Knows, MD;  Location: [facility];  Service: Orthopedics;  Laterality: Right;  . MASTECTOMY Bilateral   . ROTATOR CUFF REPAIR Bilateral   . TONSILLECTOMY    . TOTAL KNEE ARTHROPLASTY Left   . TOTAL KNEE ARTHROPLASTY Right 05/30/2017   Procedure: TOTAL KNEE ARTHROPLASTY;  Surgeon: Hessie Knows, MD;  Location: [facility];  Service: Orthopedics;  Laterality: Right;  Marland Kitchen VAGINAL HYSTERECTOMY      Family History  Problem Relation Age of Onset  . Pulmonary embolism Mother   . Healthy Father     Social History   Socioeconomic History  . Marital status: Soil scientist    Spouse name: Not on file  . Number of children: 1  . Years of education: Not on file  . Highest education level: 12th grade  Occupational History  . Occupation: Retired  Scientific laboratory technician  . Financial resource strain: Not hard at all  . Food insecurity:    Worry: Never true    Inability: Never true  . Transportation needs:    Medical: No    Non-medical: No  Tobacco Use  . Smoking status: Former Smoker    Packs/day:  0.25    Years: 10.00    Pack years: 2.50    Types: Cigarettes    Last attempt to quit: 1991    Years since quitting: 28.6  . Smokeless tobacco: Never Used  . Tobacco comment: smoking cessation materials not required  Substance and Sexual Activity  . Alcohol use: No    Alcohol/week: 0.0 standard drinks  . Drug use: No  . Sexual activity: Not Currently  Lifestyle  . Physical activity:    Days per week: 2 days    Minutes per session: 60 min  . Stress: Not at all  Relationships  . Social  connections:    Talks on phone: Patient refused    Gets together: Patient refused    Attends religious service: Patient refused    Active member of club or organization: Patient refused    Attends meetings of clubs or organizations: Patient refused    Relationship status: Patient refused  . Intimate partner violence:    Fear of current or ex partner: No    Emotionally abused: No    Physically abused: No    Forced sexual activity: No  Other Topics Concern  . Not on file  Social History Narrative  . Not on file    Allergies  Allergen Reactions  . Adhesive [Tape] Dermatitis    Can not use plastic tape. Paper tape ok   . Penicillin G Itching    Has patient had a PCN reaction causing immediate rash, facial/tongue/throat swelling, SOB or lightheadedness with hypotension: No Has patient had a PCN reaction causing severe rash involving mucus membranes or skin necrosis: No Has patient had a PCN reaction that required hospitalization: No Has patient had a PCN reaction occurring within the last 10 years: No If all of the above answers are "NO", then may proceed with Cephalosporin use.   Marland Kitchen Hydrocodone-Acetaminophen Itching and Rash    Outpatient Medications Prior to Visit  Medication Sig Dispense Refill  . acetaminophen (TYLENOL) 325 MG tablet Take 1-2 tablets (325-650 mg total) by mouth every 6 (six) hours as needed for mild pain (pain score 1-3 or temp > 100.5).    . busPIRone (BUSPAR) 5 MG tablet Take 1 tablet (5 mg total) by mouth 2 (two) times daily. (Patient taking differently: Take 5 mg by mouth daily as needed (anxiety). ) 180 tablet 1  . celecoxib (CELEBREX) 100 MG capsule Take 1 capsule (100 mg total) by mouth daily. 30 capsule 1  . chlorthalidone (HYGROTON) 25 MG tablet Take 1 tablet (25 mg total) by mouth daily. 90 tablet 2  . levothyroxine (SYNTHROID, LEVOTHROID) 50 MCG tablet Take 1 tablet (50 mcg total) by mouth daily. 90 tablet 3  . montelukast (SINGULAIR) 10 MG tablet Take  1 tablet (10 mg total) by mouth at bedtime. 30 tablet 3  . nystatin cream (MYCOSTATIN) Apply 1 application topically 2 (two) times daily. 30 g 0  . omeprazole (PRILOSEC) 20 MG capsule Take 1 capsule (20 mg total) by mouth daily. 90 capsule 2  . Probiotic CAPS Take 1 capsule by mouth daily as needed (diarrhea).    . triamcinolone (NASACORT ALLERGY 24HR) 55 MCG/ACT AERO nasal inhaler Place 2 sprays into the nose daily. (Patient taking differently: Place 2 sprays into the nose daily as needed (allergies). ) 1 Inhaler 11   No facility-administered medications prior to visit.     Review of Systems  Constitutional: Positive for diaphoresis. Negative for chills, fever, malaise/fatigue and weight loss.  HENT: Negative for  ear discharge, ear pain and sore throat.   Eyes: Negative for blurred vision.  Respiratory: Negative for cough, hemoptysis, sputum production, shortness of breath and wheezing.   Cardiovascular: Positive for chest pain and palpitations. Negative for claudication, leg swelling, syncope, PND and near-syncope.  Gastrointestinal: Positive for nausea. Negative for abdominal pain, blood in stool, constipation, diarrhea, heartburn, melena and vomiting.  Genitourinary: Negative for dysuria, frequency, hematuria and urgency.  Musculoskeletal: Negative for back pain, joint pain, myalgias and neck pain.  Skin: Negative for rash.  Neurological: Negative for dizziness, tingling, sensory change, focal weakness, weakness, numbness and headaches.  Endo/Heme/Allergies: Negative for environmental allergies and polydipsia. Does not bruise/bleed easily.  Psychiatric/Behavioral: Negative for depression and suicidal ideas. The patient is not nervous/anxious and does not have insomnia.      Objective  Vitals:   11/07/17 1021  BP: 128/80  Pulse: 80  Weight: 203 lb (92.1 kg)  Height: 5\' 5"  (1.651 m)    Physical Exam  Constitutional: She is oriented to person, place, and time. She appears  well-developed and well-nourished.  HENT:  Head: Normocephalic.  Right Ear: External ear normal.  Left Ear: External ear normal.  Mouth/Throat: Oropharynx is clear and moist.  Eyes: Pupils are equal, round, and reactive to light. Conjunctivae and EOM are normal. Lids are everted and swept, no foreign bodies found. Left eye exhibits no hordeolum. No foreign body present in the left eye. Right conjunctiva is not injected. Left conjunctiva is not injected. No scleral icterus.  Neck: Normal range of motion. Neck supple. No JVD present. No tracheal deviation present. No thyromegaly present.  Cardiovascular: Normal rate, regular rhythm, normal heart sounds and intact distal pulses. Exam reveals no gallop and no friction rub.  No murmur heard. Pulmonary/Chest: Effort normal and breath sounds normal. No respiratory distress. She has no wheezes. She has no rales.  Abdominal: Soft. Bowel sounds are normal. She exhibits no mass. There is no hepatosplenomegaly. There is no tenderness. There is no rebound and no guarding.  Musculoskeletal: Normal range of motion. She exhibits no edema or tenderness.  Lymphadenopathy:    She has no cervical adenopathy.  Neurological: She is alert and oriented to person, place, and time. She has normal strength. She displays normal reflexes. No cranial nerve deficit.  Skin: Skin is warm. No rash noted.  Psychiatric: She has a normal mood and affect. Her mood appears not anxious. She does not exhibit a depressed mood.  Nursing note and vitals reviewed.     Assessment & Plan  Problem List Items Addressed This Visit    None    Visit Diagnoses    Atypical chest pain    -  Primary   Recently seen in ER. Needs follow up with cardiology./[place]   Relevant Orders   Ambulatory referral to Cardiology   Thoracic aortic aneurysm without rupture [facility])       CTA of chest noted thoracic aneurysm 4.3 cm/ will have dr Nehemiah Massed eval with history of atypical chest pain.    Relevant Orders   Ambulatory referral to Cardiology   Ambulatory referral to Gastroenterology   Pharyngoesophageal dysphagia       Recurrent episode of dysphagia. Referral DR Donnella Sham   Relevant Orders   Ambulatory referral to Gastroenterology   Pulmonary nodule       Multiple noted Discussed with Shawn. Will recheck in 6 mos.   Flu vaccine need       administered   Relevant Orders   Flu Vaccine QUAD  6+ mos PF IM (Fluarix Quad PF) (Completed)      No orders of the defined types were placed in this encounter.     Dr. Mac[facility]  11/07/17

## 2017-11-09 ENCOUNTER — Other Ambulatory Visit: Payer: Self-pay | Admitting: Gastroenterology

## 2017-11-09 DIAGNOSIS — Z8679 Personal history of other diseases of the circulatory system: Secondary | ICD-10-CM | POA: Diagnosis not present

## 2017-11-09 DIAGNOSIS — I712 Thoracic aortic aneurysm, without rupture: Secondary | ICD-10-CM | POA: Diagnosis not present

## 2017-11-09 DIAGNOSIS — R1313 Dysphagia, pharyngeal phase: Secondary | ICD-10-CM | POA: Diagnosis not present

## 2017-11-14 ENCOUNTER — Ambulatory Visit
Admission: RE | Admit: 2017-11-14 | Discharge: 2017-11-14 | Disposition: A | Discharge status: Home / Self Care | Payer: Medicare HMO | Source: Ambulatory Visit | Attending: Gastroenterology | Admitting: Gastroenterology

## 2017-11-14 DIAGNOSIS — R1310 Dysphagia, unspecified: Secondary | ICD-10-CM | POA: Diagnosis not present

## 2017-11-14 DIAGNOSIS — R1313 Dysphagia, pharyngeal phase: Secondary | ICD-10-CM | POA: Diagnosis not present

## 2017-11-16 ENCOUNTER — Encounter (INDEPENDENT_AMBULATORY_CARE_PROVIDER_SITE_OTHER): Payer: Self-pay | Admitting: Vascular Surgery

## 2017-11-16 ENCOUNTER — Ambulatory Visit (INDEPENDENT_AMBULATORY_CARE_PROVIDER_SITE_OTHER): Payer: Medicare HMO | Admitting: Vascular Surgery

## 2017-11-16 VITALS — BP 163/92 | HR 71 | Resp 16 | Ht 66.0 in | Wt 198.0 lb

## 2017-11-16 DIAGNOSIS — E039 Hypothyroidism, unspecified: Secondary | ICD-10-CM

## 2017-11-16 DIAGNOSIS — I712 Thoracic aortic aneurysm, without rupture, unspecified: Secondary | ICD-10-CM

## 2017-11-16 DIAGNOSIS — K219 Gastro-esophageal reflux disease without esophagitis: Secondary | ICD-10-CM | POA: Diagnosis not present

## 2017-11-16 DIAGNOSIS — I716 Thoracoabdominal aortic aneurysm, without rupture, unspecified: Secondary | ICD-10-CM

## 2017-11-16 DIAGNOSIS — I208 Other forms of angina pectoris: Secondary | ICD-10-CM | POA: Diagnosis not present

## 2017-11-16 DIAGNOSIS — I10 Essential (primary) hypertension: Secondary | ICD-10-CM

## 2017-11-16 DIAGNOSIS — I25118 Atherosclerotic heart disease of native coronary artery with other forms of angina pectoris: Secondary | ICD-10-CM | POA: Diagnosis not present

## 2017-11-16 DIAGNOSIS — E782 Mixed hyperlipidemia: Secondary | ICD-10-CM | POA: Diagnosis not present

## 2017-11-19 ENCOUNTER — Encounter (INDEPENDENT_AMBULATORY_CARE_PROVIDER_SITE_OTHER): Payer: Self-pay | Admitting: Vascular Surgery

## 2017-11-19 DIAGNOSIS — I716 Thoracoabdominal aortic aneurysm, without rupture, unspecified: Secondary | ICD-10-CM | POA: Insufficient documentation

## 2017-11-19 NOTE — Progress Notes (Signed)
MRN : [id number]  Kaitlyn Oliver is a 84 y.o. (07-Jun-1933) female who presents with chief complaint of  Chief Complaint  Patient presents with  . New Patient (Initial Visit)    AAA  .  History of Present Illness:   The patient presents to the office for evaluation of an thoracic aortic aneurysm. The aneurysm was found incidentally by CT scan. Patient denies abdominal pain or unusual back pain, no other abdominal complaints.  No history of an acute onset of painful blue discoloration of the toes.     + family history of aneurysmal disease.   Patient denies amaurosis fugax or TIA symptoms. There is no history of claudication or rest pain symptoms of the lower extremities.  The patient denies angina or shortness of breath.  CT scan shows an TAA that measures <5.50 cm  Current Meds  Medication Sig  . acetaminophen (TYLENOL) 325 MG tablet Take 1-2 tablets (325-650 mg total) by mouth every 6 (six) hours as needed for mild pain (pain score 1-3 or temp > 100.5).  . busPIRone (BUSPAR) 5 MG tablet Take 1 tablet (5 mg total) by mouth 2 (two) times daily. (Patient taking differently: Take 5 mg by mouth daily as needed (anxiety). )  . celecoxib (CELEBREX) 200 MG capsule TAKE 1 CAPSULE BY MOUTH ONCE DAILY AS NEEDED FOR MODERATE PAIN  . chlorthalidone (HYGROTON) 25 MG tablet Take 1 tablet (25 mg total) by mouth daily.  Marland Kitchen levothyroxine (SYNTHROID, LEVOTHROID) 50 MCG tablet Take 1 tablet (50 mcg total) by mouth daily.  . methocarbamol (ROBAXIN) 500 MG tablet   . nystatin cream (MYCOSTATIN) Apply 1 application topically 2 (two) times daily.  Marland Kitchen omeprazole (PRILOSEC) 20 MG capsule Take 1 capsule (20 mg total) by mouth daily.  . pantoprazole (PROTONIX) 40 MG tablet Take by mouth.  . Probiotic CAPS Take 1 capsule by mouth daily as needed (diarrhea).  . triamcinolone (NASACORT ALLERGY 24HR) 55 MCG/ACT AERO nasal inhaler Place 2 sprays into the nose daily. (Patient taking differently: Place 2 sprays  into the nose daily as needed (allergies). )    Past Medical History:  Diagnosis Date  . Anxiety   . Arthritis    thumbs, hands  . Cancer [facility]) 1991   bilateral breast  . Depression   . GERD (gastroesophageal reflux disease)   . Headache    occasional - pinched nerve in neck  . Hypertension   . Hypothyroidism   . Thyroid disease   . Wears dentures    full upper, partial lower    Past Surgical History:  Procedure Laterality Date  . BREAST SURGERY Bilateral 1991   mastectomy  . CATARACT EXTRACTION Bilateral   . CHOLECYSTECTOMY    . COLONOSCOPY  2013   Dr Gustavo Lah  . HALLUX VALGUS AUSTIN Right 06/03/2015   Procedure: HALLUX VALGUS AUSTIN;  Surgeon: Samara Deist, DPM;  Location: [place];  Service: Podiatry;  Laterality: Right;  WITH POPLITEAL  . KNEE ARTHROSCOPY WITH MEDIAL MENISECTOMY Right 08/30/2016   Procedure: KNEE ARTHROSCOPY WITH PARTIAL MEDIAL MENISECTOMY and Partial Lateral Menisectomy;  Surgeon: Hessie Knows, MD;  Location: [facility];  Service: Orthopedics;  Laterality: Right;  . KNEE ARTHROSCOPY WITH SUBCHONDROPLASTY Right 08/30/2016   Procedure: KNEE ARTHROSCOPY WITH SUBCHONDROPLASTY;  Surgeon: Hessie Knows, MD;  Location: [facility];  Service: Orthopedics;  Laterality: Right;  . MASTECTOMY Bilateral   . ROTATOR CUFF REPAIR Bilateral   . TONSILLECTOMY    . TOTAL KNEE ARTHROPLASTY Left   .  TOTAL KNEE ARTHROPLASTY Right 05/30/2017   Procedure: TOTAL KNEE ARTHROPLASTY;  Surgeon: Hessie Knows, MD;  Location: [facility];  Service: Orthopedics;  Laterality: Right;  Marland Kitchen VAGINAL HYSTERECTOMY      Social History Social History   Tobacco Use  . Smoking status: Former Smoker    Packs/day: 0.25    Years: 10.00    Pack years: 2.50    Types: Cigarettes    Last attempt to quit: 1991    Years since quitting: 28.7  . Smokeless tobacco: Never Used  . Tobacco comment: smoking cessation materials not required  Substance Use Topics  . Alcohol use: No     Alcohol/week: 0.0 standard drinks  . Drug use: No    Family History Family History  Problem Relation Age of Onset  . Pulmonary embolism Mother   . Healthy Father   No family history of bleeding/clotting disorders, porphyria or autoimmune disease   Allergies  Allergen Reactions  . Adhesive [Tape] Dermatitis    Can not use plastic tape. Paper tape ok   . Penicillin G Itching    Has patient had a PCN reaction causing immediate rash, facial/tongue/throat swelling, SOB or lightheadedness with hypotension: No Has patient had a PCN reaction causing severe rash involving mucus membranes or skin necrosis: No Has patient had a PCN reaction that required hospitalization: No Has patient had a PCN reaction occurring within the last 10 years: No If all of the above answers are "NO", then may proceed with Cephalosporin use.   Marland Kitchen Hydrocodone-Acetaminophen Itching and Rash     REVIEW OF SYSTEMS (Negative unless checked)  Constitutional: [] Weight loss  [] Fever  [] Chills Cardiac: [] Chest pain   [] Chest pressure   [] Palpitations   [] Shortness of breath when laying flat   [] Shortness of breath with exertion. Vascular:  [] Pain in legs with walking   [] Pain in legs at rest  [] History of DVT   [] Phlebitis   [] Swelling in legs   [] Varicose veins   [] Non-healing ulcers Pulmonary:   [] Uses home oxygen   [] Productive cough   [] Hemoptysis   [] Wheeze  [] COPD   [] Asthma Neurologic:  [] Dizziness   [] Seizures   [] History of stroke   [] History of TIA  [] Aphasia   [] Vissual changes   [] Weakness or numbness in arm   [] Weakness or numbness in leg Musculoskeletal:   [] Joint swelling   [] Joint pain   [] Low back pain Hematologic:  [] Easy bruising  [] Easy bleeding   [] Hypercoagulable state   [] Anemic Gastrointestinal:  [] Diarrhea   [] Vomiting  [] Gastroesophageal reflux/heartburn   [] Difficulty swallowing. Genitourinary:  [] Chronic kidney disease   [] Difficult urination  [] Frequent urination   [] Blood in urine Skin:   [] Rashes   [] Ulcers  Psychological:  [] History of anxiety   []  History of major depression.  Physical Examination  Vitals:   11/16/17 1015  BP: (!) 163/92  Pulse: 71  Resp: 16  Weight: 198 lb (89.8 kg)  Height: 5\' 6"  (1.676 m)   Body mass index is 31.96 kg/m. Gen: WD/WN, NAD Head: [place]/AT, No temporalis wasting.  Ear/Nose/Throat: Hearing grossly intact, nares w/o erythema or drainage, poor dentition Eyes: PER, EOMI, sclera nonicteric.  Neck: Supple, no masses.  No bruit or JVD.  Pulmonary:  Good air movement, clear to auscultation bilaterally, no use of accessory muscles.  Cardiac: RRR, normal S1, S2, no Murmurs. Vascular:  Vessel Right Left  Radial Palpable Palpable  Ulnar Palpable Palpable  Brachial Palpable Palpable  Carotid Palpable Palpable  Femoral Palpable Palpable  Popliteal Palpable Palpable  PT Palpable Palpable  DP Palpable Palpable   Gastrointestinal: soft, non-distended. No guarding/no peritoneal signs.  Musculoskeletal: M/S 5/5 throughout.  No deformity or atrophy.  Neurologic: CN 2-12 intact. Pain and light touch intact in extremities.  Symmetrical.  Speech is fluent. Motor exam as listed above. Psychiatric: Judgment intact, Mood & affect appropriate for pt's clinical situation. Dermatologic: No rashes or ulcers noted.  No changes consistent with cellulitis. Lymph : No Cervical lymphadenopathy, no lichenification or skin changes of chronic lymphedema.  CBC Lab Results  Component Value Date   WBC 4.8 11/06/2017   HGB 12.7 11/06/2017   HCT 36.3 11/06/2017   MCV 89.5 11/06/2017   PLT 155 11/06/2017    BMET    Component Value Date/Time   NA 137 11/06/2017 0104   NA 142 02/15/2017 1137   NA 141 09/09/2011 0445   K 3.1 (L) 11/06/2017 0104   K 3.9 09/09/2011 0445   CL 105 11/06/2017 0104   CL 107 09/09/2011 0445   CO2 25 11/06/2017 0104   CO2 30 09/09/2011 0445   GLUCOSE 106 (H) 11/06/2017 0104   GLUCOSE 114 (H) 09/09/2011 0445   BUN 16 11/06/2017  0104   BUN 18 02/15/2017 1137   BUN 15 09/09/2011 0445   CREATININE 0.89 11/06/2017 0104   CREATININE 1.14 01/16/2014 1520   CALCIUM 9.4 11/06/2017 0104   CALCIUM 8.7 09/09/2011 0445   GFRNONAA 59 (L) 11/06/2017 0104   GFRNONAA 49 (L) 01/16/2014 1520   GFRNONAA 50 (L) 12/09/2011 1109   GFRAA >60 11/06/2017 0104   GFRAA 59 (L) 01/16/2014 1520   GFRAA 58 (L) 12/09/2011 1109   Estimated Creatinine Clearance: 55 mL/min (by C-G formula based on SCr of 0.89 mg/dL).  COAG Lab Results  Component Value Date   INR 1.04 05/17/2017   INR 1.0 09/05/2011    Radiology Ct Angio Chest Pe W And/or Wo Contrast  Result Date: 11/06/2017 CLINICAL DATA:  84 y/o  F; chest pain. EXAM: CT ANGIOGRAPHY CHEST WITH CONTRAST TECHNIQUE: Multidetector CT imaging of the chest was performed using the standard protocol during bolus administration of intravenous contrast. Multiplanar CT image reconstructions and MIPs were obtained to evaluate the vascular anatomy. CONTRAST:  62mL ISOVUE-370 IOPAMIDOL (ISOVUE-370) INJECTION 76% COMPARISON:  11/06/2017 chest radiograph FINDINGS: Cardiovascular: 4.3 cm ascending aortic aneurysm. Mild aortic calcific atherosclerosis. Normal heart size. No pericardial effusion. Severe coronary artery calcific atherosclerosis. Mitral annular calcifications. Satisfactory opacification of the pulmonary arteries. No pulmonary embolus identified. Mediastinum/Nodes: No enlarged mediastinal, hilar, or axillary lymph nodes. Thyroid gland, trachea, and esophagus demonstrate no significant findings. Lungs/Pleura: There several scattered pulmonary nodules throughout the lungs in the right upper lobe (series 6, image 26), in the right lower lobe (series 6 image 42 and 48), left upper lobe (series 6, image 50), and left lower lobe (series 6, image 55). The left lower lobe nodules the largest measuring 7 x 6 mm. Upper Abdomen: Lobulated contour to the liver may represent cirrhosis. Scattered calcified  granulomata in the liver. Cholecystectomy. Musculoskeletal: No chest wall abnormality. No acute or significant osseous findings. Review of the MIP images confirms the above findings. IMPRESSION: 1. No pulmonary embolus identified. 2. Scattered pulmonary nodules measuring up to 7 mm in the left lower lobe. Non-contrast chest CT at 3-6 months is recommended. If the nodules are stable at time of repeat CT, then future CT at 18-24 months (from today's scan) is considered optional for low-risk patients, but is recommended for high-risk  patients. This recommendation follows the consensus statement: Guidelines for Management of Incidental Pulmonary Nodules Detected on CT Images: From the Fleischner Society 2017; Radiology 2017; 284:228-243. 3. 4.3 cm ascending aortic aneurysm. Recommend annual imaging followup by CTA or MRA. This recommendation follows 2010 ACCF/AHA/AATS/ACR/ASA/SCA/SCAI/SIR/STS/SVM Guidelines for the Diagnosis and Management of Patients with Thoracic Aortic Disease. Circulation. 2010; 121: Q734-L937. 4. Aortic and coronary artery calcific atherosclerosis. 5. Lobulated liver contour may represent cirrhosis. Electronically Signed   By: Kristine Garbe M.D.   On: 11/06/2017 04:31   Dg Esophagus  Result Date: 11/14/2017 CLINICAL DATA:  Intermittent dysphagia. Chest pain radiating to the neck and ears. Patient reports some difficulty swallowing breads. EXAM: ESOPHOGRAM / BARIUM SWALLOW / BARIUM TABLET STUDY TECHNIQUE: Combined double contrast and single contrast examination performed using effervescent crystals, thick barium liquid, and thin barium liquid. The patient was observed with fluoroscopy swallowing a 13 mm barium sulphate tablet. FLUOROSCOPY TIME:  Fluoroscopy Time:  1 minutes Radiation Exposure Index (if provided by the fluoroscopic device): 19.3 mGy Number of Acquired Spot Images: 4+2 video loops COMPARISON:  None. FINDINGS: The patient ingested thick and thin barium and the gas-forming  crystals without difficulty. The hypopharynx distended well. There was no laryngeal penetration of the barium. There was a moderate cricopharyngeus muscle impression observed. The thoracic esophagus distended reasonably well. The barium tablet hung transiently at the level of the aortic arch but then passed prominent promptly into the stomach. There was no hiatal hernia nor gastroesophageal reflux. No stricture at the GE junction was observed. IMPRESSION: Normal cervical esophagus.  No laryngeal penetration of the barium. Transient hang up of the barium tablet at the level of the aortic arch but no obstruction to passage of liquid barium. No evidence of esophagitis. No hiatal hernia. Given the patient's symptoms, direct visualization and consideration of possible esophageal dilation would be useful. Electronically Signed   By: David  Martinique M.D.   On: 11/14/2017 09:41   Dg Chest Port 1 View  Result Date: 11/06/2017 CLINICAL DATA:  Chest pain EXAM: PORTABLE CHEST 1 VIEW COMPARISON:  09/05/2017 FINDINGS: Heart is borderline enlarged. No confluent opacities, effusions or edema. No acute bony abnormality. IMPRESSION: Borderline heart size.  No active disease. Electronically Signed   By: Rolm Baptise M.D.   On: 11/06/2017 01:20     Assessment/Plan 1. Thoracoabdominal aortic aneurysm (TAAA) without rupture (HCC) No surgery or intervention at this time. The patient has an asymptomatic thoracic aortic aneurysm that is less than 5 cm in maximal diameter.  I have discussed the natural history of abdominal aortic aneurysm and the small risk of rupture for aneurysm less than 5 cm in size.  However, as these small aneurysms tend to enlarge over time, continued surveillance with CT scan (since this is a thoracic aneurysm) is mandatory.   I have also discussed optimizing medical management with hypertension and lipid control and the importance of abstinence from tobacco.  The patient is also encouraged to exercise a  minimum of 30 minutes 4 times a week.   Should the patient develop new onset abdominal or back pain or signs of peripheral embolization they are instructed to seek medical attention immediately and to alert the physician providing care that they have an aneurysm.   The patient voices their understanding. The patient will return in 12 months with a chest CT  - VAS US AORTA/IVC/ILIACS; Future  2. Essential (primary) hypertension Continue antihypertensive medications as already ordered, these medications have been reviewed and there are no  changes at this time.   3. Gastroesophageal reflux disease, esophagitis presence not specified Continue PPI as already ordered, this medication has been reviewed and there are no changes at this time.  Avoidence of caffeine and alcohol  Moderate elevation of the head of the bed   4. Hypothyroidism, unspecified type Continue Synthroid as already ordered, these medications have been reviewed and there are no changes at this time.   5. Thoracic aortic aneurysm without rupture ([place]) See #1  - CT CHEST LIMITED WO CONTRAST; Future    Hortencia Pilar, MD  11/19/2017 1:23 PM

## 2017-11-27 ENCOUNTER — Encounter (INDEPENDENT_AMBULATORY_CARE_PROVIDER_SITE_OTHER): Payer: Self-pay | Admitting: Nurse Practitioner

## 2017-11-27 ENCOUNTER — Ambulatory Visit (INDEPENDENT_AMBULATORY_CARE_PROVIDER_SITE_OTHER): Payer: Medicare HMO | Admitting: Nurse Practitioner

## 2017-11-27 ENCOUNTER — Ambulatory Visit (INDEPENDENT_AMBULATORY_CARE_PROVIDER_SITE_OTHER): Payer: Medicare HMO

## 2017-11-27 VITALS — BP 175/84 | HR 55 | Resp 14 | Ht 65.0 in | Wt 204.0 lb

## 2017-11-27 DIAGNOSIS — I716 Thoracoabdominal aortic aneurysm, without rupture, unspecified: Secondary | ICD-10-CM

## 2017-11-27 DIAGNOSIS — I714 Abdominal aortic aneurysm, without rupture, unspecified: Secondary | ICD-10-CM

## 2017-11-27 DIAGNOSIS — I10 Essential (primary) hypertension: Secondary | ICD-10-CM

## 2017-11-27 DIAGNOSIS — K219 Gastro-esophageal reflux disease without esophagitis: Secondary | ICD-10-CM

## 2017-11-27 NOTE — Progress Notes (Signed)
Subjective:    Patient ID: Kaitlyn Oliver, female    DOB: [birth year], 84 y.o.   MRN: [id number] Chief Complaint  Patient presents with  . Follow-up    Follow up Aortic iliac     HPI  Kaitlyn Oliver is a 84 y.o. female who presents after evaluation of a thoracic aortic aneurysm.  The patient was scanned today for an abdominal aortic aneurysm, due to a positive family history of any aneurysmal disease.  Patient denies amaurosis fugax or TIA-like symptoms.  There is no history of claudication or rest pain symptoms.  The patient endorses having episodes of diarrhea, that are not accompanied with fever, chills, nausea, vomiting.  Patient underwent an side of the abdominal aorta, which revealed an distal abdominal aortic aneurysm measuring 3.6 cm at its largest.  There is no previous study to compare this to.  Constitutional: [] Weight loss  [] Fever  [] Chills Cardiac: [] Chest pain   [] Chest pressure   [] Palpitations   [] Shortness of breath when laying flat   [x] Shortness of breath with exertion. Vascular:  [] Pain in legs with walking   [] Pain in legs with standing  [] History of DVT   [] Phlebitis   [] Swelling in legs   [] Varicose veins   [] Non-healing ulcers Pulmonary:   [] Uses home oxygen   [] Productive cough   [] Hemoptysis   [] Wheeze  [] COPD   [] Asthma Neurologic:  [] Dizziness   [] Seizures   [] History of stroke   [] History of TIA  [] Aphasia   [] Vissual changes   [] Weakness or numbness in arm   [] Weakness or numbness in leg Musculoskeletal:   [] Joint swelling   [] Joint pain   [] Low back pain Hematologic:  [] Easy bruising  [] Easy bleeding   [] Hypercoagulable state   [] Anemic Gastrointestinal:  [x] Diarrhea   [] Vomiting  [x] Gastroesophageal reflux/heartburn   [] Difficulty swallowing. Genitourinary:  [] Chronic kidney disease   [] Difficult urination  [] Frequent urination   [] Blood in urine Skin:  [] Rashes   [] Ulcers  Psychological:  [] History of anxiety   []  History of major depression.     Objective:   Physical Exam  BP (!) 175/84 (BP Location: Right Arm, Patient Position: Sitting)   Pulse (!) 55   Resp 14   Ht 5\' 5"  (1.651 m)   Wt 204 lb (92.5 kg)   BMI 33.95 kg/m   Past Medical History:  Diagnosis Date  . Anxiety   . Arthritis    thumbs, hands  . Cancer [facility]) 1991   bilateral breast  . Depression   . GERD (gastroesophageal reflux disease)   . Headache    occasional - pinched nerve in neck  . Hypertension   . Hypothyroidism   . Thyroid disease   . Wears dentures    full upper, partial lower     Gen: WD/WN, NAD Head: [place]/AT, No temporalis wasting.  Ear/Nose/Throat: Hearing grossly intact, nares w/o erythema or drainage Eyes: PER, EOMI, sclera nonicteric.  Neck: Supple, no masses.  No JVD.  Pulmonary:  Good air movement, no use of accessory muscles.  Cardiac: RRR Vascular:  Vessel Right Left  Radial  palpable  palpable  Brachial    Femoral    Popliteal    PT    DP    Carotid    Gastrointestinal: soft, non-distended. No guarding/no peritoneal signs.  Musculoskeletal: M/S 5/5 throughout.  No deformity or atrophy.  Neurologic: Pain and light touch intact in extremities.  Symmetrical.  Speech is fluent. Motor exam as listed above. Psychiatric: Judgment intact,  Mood & affect appropriate for pt's clinical situation. Dermatologic: No Venous rashes. No Ulcers Noted.  No changes consistent with cellulitis. Lymph : No Cervical lymphadenopathy, no lichenification or skin changes of chronic lymphedema.   Social History   Socioeconomic History  . Marital status: Soil scientist    Spouse name: Not on file  . Number of children: 1  . Years of education: Not on file  . Highest education level: 12th grade  Occupational History  . Occupation: Retired  Scientific laboratory technician  . Financial resource strain: Not hard at all  . Food insecurity:    Worry: Never true    Inability: Never true  . Transportation needs:    Medical: No    Non-medical: No  Tobacco Use    . Smoking status: Former Smoker    Packs/day: 0.25    Years: 10.00    Pack years: 2.50    Types: Cigarettes    Last attempt to quit: 1991    Years since quitting: 28.7  . Smokeless tobacco: Never Used  . Tobacco comment: smoking cessation materials not required  Substance and Sexual Activity  . Alcohol use: No    Alcohol/week: 0.0 standard drinks  . Drug use: No  . Sexual activity: Not Currently  Lifestyle  . Physical activity:    Days per week: 2 days    Minutes per session: 60 min  . Stress: Not at all  Relationships  . Social connections:    Talks on phone: Patient refused    Gets together: Patient refused    Attends religious service: Patient refused    Active member of club or organization: Patient refused    Attends meetings of clubs or organizations: Patient refused    Relationship status: Patient refused  . Intimate partner violence:    Fear of current or ex partner: No    Emotionally abused: No    Physically abused: No    Forced sexual activity: No  Other Topics Concern  . Not on file  Social History Narrative  . Not on file    Past Surgical History:  Procedure Laterality Date  . BREAST SURGERY Bilateral 1991   mastectomy  . CATARACT EXTRACTION Bilateral   . CHOLECYSTECTOMY    . COLONOSCOPY  2013   Dr Gustavo Lah  . HALLUX VALGUS AUSTIN Right 06/03/2015   Procedure: HALLUX VALGUS AUSTIN;  Surgeon: Samara Deist, DPM;  Location: [place];  Service: Podiatry;  Laterality: Right;  WITH POPLITEAL  . KNEE ARTHROSCOPY WITH MEDIAL MENISECTOMY Right 08/30/2016   Procedure: KNEE ARTHROSCOPY WITH PARTIAL MEDIAL MENISECTOMY and Partial Lateral Menisectomy;  Surgeon: Hessie Knows, MD;  Location: [facility];  Service: Orthopedics;  Laterality: Right;  . KNEE ARTHROSCOPY WITH SUBCHONDROPLASTY Right 08/30/2016   Procedure: KNEE ARTHROSCOPY WITH SUBCHONDROPLASTY;  Surgeon: Hessie Knows, MD;  Location: [facility];  Service: Orthopedics;  Laterality: Right;  .  MASTECTOMY Bilateral   . ROTATOR CUFF REPAIR Bilateral   . TONSILLECTOMY    . TOTAL KNEE ARTHROPLASTY Left   . TOTAL KNEE ARTHROPLASTY Right 05/30/2017   Procedure: TOTAL KNEE ARTHROPLASTY;  Surgeon: Hessie Knows, MD;  Location: [facility];  Service: Orthopedics;  Laterality: Right;  Marland Kitchen VAGINAL HYSTERECTOMY      Family History  Problem Relation Age of Onset  . Pulmonary embolism Mother   . Healthy Father     Allergies  Allergen Reactions  . Adhesive [Tape] Dermatitis    Can not use plastic tape. Paper tape ok   . Penicillin  G Itching    Has patient had a PCN reaction causing immediate rash, facial/tongue/throat swelling, SOB or lightheadedness with hypotension: No Has patient had a PCN reaction causing severe rash involving mucus membranes or skin necrosis: No Has patient had a PCN reaction that required hospitalization: No Has patient had a PCN reaction occurring within the last 10 years: No If all of the above answers are "NO", then may proceed with Cephalosporin use.   Marland Kitchen Hydrocodone-Acetaminophen Itching and Rash       Assessment & Plan:   1. AAA (abdominal aortic aneurysm) without rupture (HCC)  No surgery or intervention at this time. The patient has an asymptomatic abdominal aortic aneurysm that is less than 4 cm in maximal diameter.  I have discussed the natural history of abdominal aortic aneurysm and the small risk of rupture for aneurysm less than 5 cm in size.  However, as these small aneurysms tend to enlarge over time, continued surveillance with ultrasound or CT scan is mandatory.  I have also discussed optimizing medical management with hypertension and lipid control and the importance of abstinence from tobacco.  The patient is also encouraged to exercise a minimum of 30 minutes 4 times a week.  Should the patient develop new onset abdominal or back pain or signs of peripheral embolization they are instructed to seek medical attention immediately and to alert the  physician providing care that they have an aneurysm.  The patient voices their understanding. The patient will return in 12 months     - CT Abdomen Pelvis W Contrast; Future  2. Thoracoabdominal aortic aneurysm (TAAA) without rupture [facility]) Patient also has a thoracoabdominal aortic aneurysm which we are monitoring for growth.  The patient will have a CT scan in 1 year to evaluate for growth of this aneurysm.  We will plan to evaluate the abdominal aortic aneurysm at the same time.  3. Essential (primary) hypertension Continue antihypertensive medications as already ordered, these medications have been reviewed and there are no changes at this time.   4. Gastroesophageal reflux disease, esophagitis presence not specified Continue PPI as already ordered, this medication has been reviewed and there are no changes at this time.  Avoidence of caffeine and alcohol  Moderate elevation of the head of the bed    Current Outpatient Medications on File Prior to Visit  Medication Sig Dispense Refill  . amLODipine (NORVASC) 2.5 MG tablet Take 2.5 mg by mouth daily.  11  . busPIRone (BUSPAR) 5 MG tablet Take 1 tablet (5 mg total) by mouth 2 (two) times daily. 180 tablet 1  . nystatin cream (MYCOSTATIN) Apply 1 application topically 2 (two) times daily. 30 g 0  . pantoprazole (PROTONIX) 40 MG tablet Take by mouth.    . Probiotic CAPS Take 1 capsule by mouth daily as needed (diarrhea).    . triamcinolone (NASACORT ALLERGY 24HR) 55 MCG/ACT AERO nasal inhaler Place 2 sprays into the nose daily. (Patient taking differently: Place 2 sprays into the nose daily as needed (allergies). ) 1 Inhaler 11  . acetaminophen (TYLENOL) 325 MG tablet Take 1-2 tablets (325-650 mg total) by mouth every 6 (six) hours as needed for mild pain (pain score 1-3 or temp > 100.5). (Patient not taking: Reported on 11/27/2017)    . celecoxib (CELEBREX) 200 MG capsule TAKE 1 CAPSULE BY MOUTH ONCE DAILY AS NEEDED FOR MODERATE PAIN   3  . chlorthalidone (HYGROTON) 25 MG tablet Take 1 tablet (25 mg total) by mouth daily. (Patient not  taking: Reported on 11/27/2017) 90 tablet 2  . methocarbamol (ROBAXIN) 500 MG tablet      No current facility-administered medications on file prior to visit.     There are no Patient Instructions on file for this visit. No follow-ups on file.   Kris Hartmann, NP

## 2017-11-29 ENCOUNTER — Emergency Department: Payer: Medicare HMO

## 2017-11-29 ENCOUNTER — Other Ambulatory Visit: Payer: Self-pay

## 2017-11-29 ENCOUNTER — Encounter (INDEPENDENT_AMBULATORY_CARE_PROVIDER_SITE_OTHER): Payer: Self-pay | Admitting: Nurse Practitioner

## 2017-11-29 ENCOUNTER — Encounter: Payer: Self-pay | Admitting: Emergency Medicine

## 2017-11-29 ENCOUNTER — Inpatient Hospital Stay
Admission: EM | Admit: 2017-11-29 | Discharge: 2017-12-01 | DRG: 390 | Disposition: A | Discharge status: Home / Self Care | Payer: Medicare HMO | Attending: Family Medicine | Admitting: Family Medicine

## 2017-11-29 DIAGNOSIS — K565 Intestinal adhesions [bands], unspecified as to partial versus complete obstruction: Secondary | ICD-10-CM | POA: Diagnosis present

## 2017-11-29 DIAGNOSIS — Z9842 Cataract extraction status, left eye: Secondary | ICD-10-CM | POA: Diagnosis not present

## 2017-11-29 DIAGNOSIS — Z91048 Other nonmedicinal substance allergy status: Secondary | ICD-10-CM

## 2017-11-29 DIAGNOSIS — E669 Obesity, unspecified: Secondary | ICD-10-CM | POA: Diagnosis present

## 2017-11-29 DIAGNOSIS — Z972 Presence of dental prosthetic device (complete) (partial): Secondary | ICD-10-CM | POA: Diagnosis not present

## 2017-11-29 DIAGNOSIS — K219 Gastro-esophageal reflux disease without esophagitis: Secondary | ICD-10-CM | POA: Diagnosis present

## 2017-11-29 DIAGNOSIS — I712 Thoracic aortic aneurysm, without rupture: Secondary | ICD-10-CM | POA: Diagnosis present

## 2017-11-29 DIAGNOSIS — E785 Hyperlipidemia, unspecified: Secondary | ICD-10-CM | POA: Diagnosis present

## 2017-11-29 DIAGNOSIS — Z90710 Acquired absence of both cervix and uterus: Secondary | ICD-10-CM | POA: Diagnosis not present

## 2017-11-29 DIAGNOSIS — E039 Hypothyroidism, unspecified: Secondary | ICD-10-CM | POA: Diagnosis present

## 2017-11-29 DIAGNOSIS — R69 Illness, unspecified: Secondary | ICD-10-CM | POA: Diagnosis not present

## 2017-11-29 DIAGNOSIS — Z9013 Acquired absence of bilateral breasts and nipples: Secondary | ICD-10-CM

## 2017-11-29 DIAGNOSIS — Z79899 Other long term (current) drug therapy: Secondary | ICD-10-CM | POA: Diagnosis not present

## 2017-11-29 DIAGNOSIS — R1010 Upper abdominal pain, unspecified: Secondary | ICD-10-CM | POA: Diagnosis not present

## 2017-11-29 DIAGNOSIS — K56609 Unspecified intestinal obstruction, unspecified as to partial versus complete obstruction: Secondary | ICD-10-CM | POA: Diagnosis not present

## 2017-11-29 DIAGNOSIS — R079 Chest pain, unspecified: Secondary | ICD-10-CM | POA: Diagnosis not present

## 2017-11-29 DIAGNOSIS — Z7951 Long term (current) use of inhaled steroids: Secondary | ICD-10-CM | POA: Diagnosis not present

## 2017-11-29 DIAGNOSIS — Z885 Allergy status to narcotic agent status: Secondary | ICD-10-CM | POA: Diagnosis not present

## 2017-11-29 DIAGNOSIS — Z880 Allergy status to penicillin: Secondary | ICD-10-CM | POA: Diagnosis not present

## 2017-11-29 DIAGNOSIS — Z4682 Encounter for fitting and adjustment of non-vascular catheter: Secondary | ICD-10-CM | POA: Diagnosis not present

## 2017-11-29 DIAGNOSIS — Z9841 Cataract extraction status, right eye: Secondary | ICD-10-CM | POA: Diagnosis not present

## 2017-11-29 DIAGNOSIS — Z96653 Presence of artificial knee joint, bilateral: Secondary | ICD-10-CM | POA: Diagnosis present

## 2017-11-29 DIAGNOSIS — F419 Anxiety disorder, unspecified: Secondary | ICD-10-CM | POA: Diagnosis present

## 2017-11-29 DIAGNOSIS — R0789 Other chest pain: Secondary | ICD-10-CM | POA: Diagnosis not present

## 2017-11-29 DIAGNOSIS — E876 Hypokalemia: Secondary | ICD-10-CM | POA: Diagnosis present

## 2017-11-29 DIAGNOSIS — Z6833 Body mass index (BMI) 33.0-33.9, adult: Secondary | ICD-10-CM

## 2017-11-29 DIAGNOSIS — J449 Chronic obstructive pulmonary disease, unspecified: Secondary | ICD-10-CM | POA: Diagnosis not present

## 2017-11-29 DIAGNOSIS — I10 Essential (primary) hypertension: Secondary | ICD-10-CM | POA: Diagnosis present

## 2017-11-29 DIAGNOSIS — K5730 Diverticulosis of large intestine without perforation or abscess without bleeding: Secondary | ICD-10-CM | POA: Diagnosis not present

## 2017-11-29 DIAGNOSIS — Z9049 Acquired absence of other specified parts of digestive tract: Secondary | ICD-10-CM | POA: Diagnosis not present

## 2017-11-29 DIAGNOSIS — I2510 Atherosclerotic heart disease of native coronary artery without angina pectoris: Secondary | ICD-10-CM | POA: Diagnosis not present

## 2017-11-29 DIAGNOSIS — F329 Major depressive disorder, single episode, unspecified: Secondary | ICD-10-CM | POA: Diagnosis present

## 2017-11-29 DIAGNOSIS — K6389 Other specified diseases of intestine: Secondary | ICD-10-CM | POA: Diagnosis not present

## 2017-11-29 LAB — COMPREHENSIVE METABOLIC PANEL
ALT: 46 U/L — AB (ref 0–44)
AST: 47 U/L — AB (ref 15–41)
Albumin: 4 g/dL (ref 3.5–5.0)
Alkaline Phosphatase: 57 U/L (ref 38–126)
Anion gap: 12 (ref 5–15)
BUN: 15 mg/dL (ref 8–23)
CHLORIDE: 102 mmol/L (ref 98–111)
CO2: 24 mmol/L (ref 22–32)
CREATININE: 0.91 mg/dL (ref 0.44–1.00)
Calcium: 9.9 mg/dL (ref 8.9–10.3)
GFR calc Af Amer: >60 mL/min (ref >60)
GFR calc non Af Amer: 57 mL/min — ABNORMAL LOW (ref >60)
Glucose, Bld: 162 mg/dL — ABNORMAL HIGH (ref 70–99)
Potassium: 3.3 mmol/L — ABNORMAL LOW (ref 3.5–5.1)
SODIUM: 138 mmol/L (ref 135–145)
Total Bilirubin: 0.8 mg/dL (ref 0.3–1.2)
Total Protein: 7.2 g/dL (ref 6.5–8.1)

## 2017-11-29 LAB — CBC WITH DIFFERENTIAL/PLATELET
BASOS ABS: 0 10*3/uL (ref 0–0.1)
Basophils Relative: 0 %
EOS ABS: 0.1 10*3/uL (ref 0–0.7)
EOS PCT: 1 %
HCT: 39.4 % (ref 35.0–47.0)
Hemoglobin: 13.4 g/dL (ref 12.0–16.0)
Lymphocytes Relative: 18 %
Lymphs Abs: 1.1 10*3/uL (ref 1.0–3.6)
MCH: 30.5 pg (ref 26.0–34.0)
MCHC: 34 g/dL (ref 32.0–36.0)
MCV: 89.7 fL (ref 80.0–100.0)
Monocytes Absolute: 0.3 10*3/uL (ref 0.2–0.9)
Monocytes Relative: 5 %
NEUTROS PCT: 76 %
Neutro Abs: 4.4 10*3/uL (ref 1.4–6.5)
PLATELETS: 125 10*3/uL — AB (ref 150–440)
RBC: 4.39 MIL/uL (ref 3.80–5.20)
RDW: 15.4 % — ABNORMAL HIGH (ref 11.5–14.5)
WBC: 5.8 10*3/uL (ref 3.6–11.0)

## 2017-11-29 LAB — TROPONIN I

## 2017-11-29 MED ORDER — PHENOL 1.4 % MT LIQD
1.0000 | OROMUCOSAL | Status: DC | PRN
  Administered 2017-11-29: 1 via OROMUCOSAL
  Filled 2017-11-29: qty 177

## 2017-11-29 MED ORDER — AMLODIPINE BESYLATE 5 MG PO TABS
2.5000 mg | ORAL_TABLET | Freq: Every day | ORAL | Status: DC
  Administered 2017-11-29 – 2017-12-01 (×3): 2.5 mg via ORAL
  Filled 2017-11-29 (×3): qty 1

## 2017-11-29 MED ORDER — FENTANYL CITRATE (PF) 100 MCG/2ML IJ SOLN
25.0000 ug | INTRAMUSCULAR | Status: DC | PRN
  Administered 2017-11-29: 25 ug via INTRAVENOUS
  Filled 2017-11-29: qty 2

## 2017-11-29 MED ORDER — IOPAMIDOL (ISOVUE-370) INJECTION 76%
125.0000 mL | Freq: Once | INTRAVENOUS | Status: AC | PRN
  Administered 2017-11-29: 125 mL via INTRAVENOUS

## 2017-11-29 MED ORDER — DEXTROSE-NACL 5-0.45 % IV SOLN
INTRAVENOUS | Status: DC
  Administered 2017-11-29 – 2017-11-30 (×2): via INTRAVENOUS

## 2017-11-29 MED ORDER — CLONAZEPAM 0.125 MG PO TBDP: 0.1250 mg | ORAL_TABLET | Freq: Two times a day (BID) | ORAL | Status: DC | PRN

## 2017-11-29 MED ORDER — POTASSIUM CHLORIDE 20 MEQ PO PACK
40.0000 meq | PACK | Freq: Once | ORAL | Status: AC
  Administered 2017-11-29: 40 meq via ORAL
  Filled 2017-11-29: qty 2

## 2017-11-29 MED ORDER — ENOXAPARIN SODIUM 40 MG/0.4ML ~~LOC~~ SOLN
40.0000 mg | SUBCUTANEOUS | Status: DC
  Administered 2017-11-29 – 2017-11-30 (×2): 40 mg via SUBCUTANEOUS
  Filled 2017-11-29 (×2): qty 0.4

## 2017-11-29 MED ORDER — ACETAMINOPHEN 325 MG PO TABS: 650.0000 mg | ORAL_TABLET | Freq: Four times a day (QID) | ORAL | Status: DC | PRN

## 2017-11-29 MED ORDER — HYDROCODONE-ACETAMINOPHEN 5-325 MG PO TABS: 1.0000 | ORAL_TABLET | ORAL | Status: DC | PRN

## 2017-11-29 MED ORDER — ONDANSETRON HCL 4 MG PO TABS: 4.0000 mg | ORAL_TABLET | Freq: Four times a day (QID) | ORAL | Status: DC | PRN

## 2017-11-29 MED ORDER — ONDANSETRON HCL 4 MG/2ML IJ SOLN
4.0000 mg | Freq: Once | INTRAMUSCULAR | Status: AC
  Administered 2017-11-29: 4 mg via INTRAVENOUS
  Filled 2017-11-29: qty 2

## 2017-11-29 MED ORDER — FAMOTIDINE IN NACL 20-0.9 MG/50ML-% IV SOLN
20.0000 mg | Freq: Two times a day (BID) | INTRAVENOUS | Status: DC
  Administered 2017-11-29 – 2017-11-30 (×4): 20 mg via INTRAVENOUS
  Filled 2017-11-29 (×5): qty 50

## 2017-11-29 MED ORDER — DIPHENHYDRAMINE HCL 50 MG/ML IJ SOLN: 12.5000 mg | Freq: Four times a day (QID) | INTRAMUSCULAR | Status: DC | PRN

## 2017-11-29 MED ORDER — TRAZODONE HCL 50 MG PO TABS: 50.0000 mg | ORAL_TABLET | Freq: Every evening | ORAL | Status: DC | PRN

## 2017-11-29 MED ORDER — ACETAMINOPHEN 650 MG RE SUPP: 650.0000 mg | Freq: Four times a day (QID) | RECTAL | Status: DC | PRN

## 2017-11-29 MED ORDER — MORPHINE SULFATE (PF) 2 MG/ML IV SOLN
2.0000 mg | INTRAVENOUS | Status: DC | PRN
  Administered 2017-11-29 – 2017-11-30 (×5): 2 mg via INTRAVENOUS
  Filled 2017-11-29 (×5): qty 1

## 2017-11-29 MED ORDER — ONDANSETRON HCL 4 MG/2ML IJ SOLN: 4.0000 mg | Freq: Four times a day (QID) | INTRAMUSCULAR | Status: DC | PRN

## 2017-11-29 MED ORDER — NYSTATIN 100000 UNIT/GM EX CREA
1.0000 "application " | TOPICAL_CREAM | Freq: Two times a day (BID) | CUTANEOUS | Status: DC
  Administered 2017-11-30: 1 via TOPICAL
  Filled 2017-11-29: qty 15

## 2017-11-29 MED ORDER — TRIAMCINOLONE ACETONIDE 55 MCG/ACT NA AERO: 2.0000 | INHALATION_SPRAY | Freq: Every day | NASAL | Status: DC | PRN

## 2017-11-29 NOTE — ED Notes (Signed)
Patient transported to CT 

## 2017-11-29 NOTE — H&P (Signed)
[facility] [place] NAME: Kaitlyn Oliver    MR#:  [id number]  DATE OF BIRTH:  Feb 11, 1934  DATE OF ADMISSION:  11/29/2017  PRIMARY CARE PHYSICIAN: Juline Patch, MD   REQUESTING/REFERRING PHYSICIAN:   CHIEF COMPLAINT:   Chief Complaint  Patient presents with  . Chest Pain    HISTORY OF PRESENT ILLNESS: Kaitlyn Oliver  is a 84 y.o. female with a known history per below presented to the emergency room with mid epigastric/chest pain that started last night, intensity 5-8/10, complains of nausea, no emesis,, ER work-up noted for bowel obstruction with transition point within the pelvis on CT of the abdomen/pelvis, CT of the chest was noted for a sending aortic aneurysm 4.1 cm/COPD changes, potassium 3.3, patient was evaluated in the emergency room by general surgery-recommended hospitalist admission/conservative medical management, patient now be admitted for acute partial small bowel obstruction, acute hypokalemia.  PAST MEDICAL HISTORY:   Past Medical History:  Diagnosis Date  . Anxiety   . Arthritis    thumbs, hands  . Cancer [facility]) 1991   bilateral breast  . Depression   . GERD (gastroesophageal reflux disease)   . Headache    occasional - pinched nerve in neck  . Hypertension   . Hypothyroidism   . Thyroid disease   . Wears dentures    full upper, partial lower    PAST SURGICAL HISTORY:  Past Surgical History:  Procedure Laterality Date  . BREAST SURGERY Bilateral 1991   mastectomy  . CATARACT EXTRACTION Bilateral   . CHOLECYSTECTOMY    . COLONOSCOPY  2013   Dr Gustavo Lah  . HALLUX VALGUS AUSTIN Right 06/03/2015   Procedure: HALLUX VALGUS AUSTIN;  Surgeon: Samara Deist, DPM;  Location: [facility];  Service: Podiatry;  Laterality: Right;  WITH POPLITEAL  . KNEE ARTHROSCOPY WITH MEDIAL MENISECTOMY Right 08/30/2016   Procedure: KNEE ARTHROSCOPY WITH PARTIAL MEDIAL MENISECTOMY and Partial Lateral Menisectomy;  Surgeon: Hessie Knows, MD;  Location: [facility];  Service: Orthopedics;  Laterality: Right;  . KNEE ARTHROSCOPY WITH SUBCHONDROPLASTY Right 08/30/2016   Procedure: KNEE ARTHROSCOPY WITH SUBCHONDROPLASTY;  Surgeon: Hessie Knows, MD;  Location: [facility];  Service: Orthopedics;  Laterality: Right;  . MASTECTOMY Bilateral   . ROTATOR CUFF REPAIR Bilateral   . TONSILLECTOMY    . TOTAL KNEE ARTHROPLASTY Left   . TOTAL KNEE ARTHROPLASTY Right 05/30/2017   Procedure: TOTAL KNEE ARTHROPLASTY;  Surgeon: Hessie Knows, MD;  Location: [facility];  Service: Orthopedics;  Laterality: Right;  Marland Kitchen VAGINAL HYSTERECTOMY      SOCIAL HISTORY:  Social History   Tobacco Use  . Smoking status: Former Smoker    Packs/day: 0.25    Years: 10.00    Pack years: 2.50    Types: Cigarettes    Last attempt to quit: 1991    Years since quitting: 28.7  . Smokeless tobacco: Never Used  . Tobacco comment: smoking cessation materials not required  Substance Use Topics  . Alcohol use: No    Alcohol/week: 0.0 standard drinks    FAMILY HISTORY:  Family History  Problem Relation Age of Onset  . Pulmonary embolism Mother   . Healthy Father     DRUG ALLERGIES:  Allergies  Allergen Reactions  . Adhesive [Tape] Dermatitis    Can not use plastic tape. Paper tape ok   . Penicillin G Itching    Has patient had a PCN reaction causing immediate rash, facial/tongue/throat swelling, SOB or lightheadedness  with hypotension: No Has patient had a PCN reaction causing severe rash involving mucus membranes or skin necrosis: No Has patient had a PCN reaction that required hospitalization: No Has patient had a PCN reaction occurring within the last 10 years: No If all of the above answers are "NO", then may proceed with Cephalosporin use.   Marland Kitchen Hydrocodone-Acetaminophen Itching and Rash    REVIEW OF SYSTEMS:   CONSTITUTIONAL: No fever, +fatigue, weakness.  EYES: No blurred or double vision.  EARS, NOSE, AND THROAT: No tinnitus or ear pain.   RESPIRATORY: No cough, shortness of breath, wheezing or hemoptysis.  CARDIOVASCULAR: No chest pain, orthopnea, edema.  GASTROINTESTINAL: + nausea, abdominal pain.  GENITOURINARY: No dysuria, hematuria.  ENDOCRINE: No polyuria, nocturia,  HEMATOLOGY: No anemia, easy bruising or bleeding SKIN: No rash or lesion. MUSCULOSKELETAL: No joint pain or arthritis.   NEUROLOGIC: No tingling, numbness, weakness.  PSYCHIATRY: No anxiety or depression.   MEDICATIONS AT HOME:  Prior to Admission medications   Medication Sig Start Date End Date Taking? Authorizing Provider  amLODipine (NORVASC) 2.5 MG tablet Take 2.5 mg by mouth daily. 11/16/17  Yes [provider]  nystatin cream (MYCOSTATIN) Apply 1 application topically 2 (two) times daily. 08/15/17  Yes Juline Patch, MD  pantoprazole (PROTONIX) 40 MG tablet Take 40 mg by mouth daily.  11/09/17 11/09/18 Yes [provider]  triamcinolone (NASACORT ALLERGY 24HR) 55 MCG/ACT AERO nasal inhaler Place 2 sprays into the nose daily. Patient taking differently: Place 2 sprays into the nose daily as needed (allergies).  12/22/15  Yes Juline Patch, MD  acetaminophen (TYLENOL) 325 MG tablet Take 1-2 tablets (325-650 mg total) by mouth every 6 (six) hours as needed for mild pain (pain score 1-3 or temp > 100.5). Patient not taking: Reported on 11/27/2017 06/01/17   Duanne Guess, PA-C  celecoxib (CELEBREX) 200 MG capsule TAKE 1 CAPSULE BY MOUTH ONCE DAILY AS NEEDED FOR MODERATE PAIN 10/24/17   [provider]  Probiotic CAPS Take 1 capsule by mouth daily as needed (diarrhea).    [provider]      PHYSICAL EXAMINATION:   VITAL SIGNS: Blood pressure (!) 150/88, pulse 73, temperature 97.6 F (36.4 C), temperature source Oral, resp. rate 16, weight 90.7 kg, SpO2 96 %.  GENERAL:  84 y.o.-year-old patient lying in the bed with no acute distress.  Obese EYES: Pupils equal, round, reactive to light and accommodation. No  scleral icterus. Extraocular muscles intact.  HEENT: Head atraumatic, normocephalic. Oropharynx and nasopharynx clear.  NECK:  Supple, no jugular venous distention. No thyroid enlargement, no tenderness.  LUNGS: Normal breath sounds bilaterally, no wheezing, rales,rhonchi or crepitation. No use of accessory muscles of respiration.  CARDIOVASCULAR: S1, S2 normal. No murmurs, rubs, or gallops.  ABDOMEN: Soft, mild tenderness, no rebound/guarding. Bowel sounds present. No organomegaly or mass.  EXTREMITIES: No pedal edema, cyanosis, or clubbing.  NEUROLOGIC: Cranial nerves II through XII are intact. Muscle strength 5/5 in all extremities. Sensation intact. Gait not checked.  PSYCHIATRIC: The patient is alert and oriented x 3.  SKIN: No obvious rash, lesion, or ulcer.   LABORATORY PANEL:   CBC Recent Labs  Lab 11/29/17 0508  WBC 5.8  HGB 13.4  HCT 39.4  PLT 125*  MCV 89.7  MCH 30.5  MCHC 34.0  RDW 15.4*  LYMPHSABS 1.1  MONOABS 0.3  EOSABS 0.1  BASOSABS 0.0   ------------------------------------------------------------------------------------------------------------------  Chemistries  Recent Labs  Lab 11/29/17 0508  NA 138  K 3.3*  CL 102  CO2 24  GLUCOSE 162*  BUN 15  CREATININE 0.91  CALCIUM 9.9  AST 47*  ALT 46*  ALKPHOS 57  BILITOT 0.8   ------------------------------------------------------------------------------------------------------------------ estimated creatinine clearance is 53 mL/min (by C-G formula based on SCr of 0.91 mg/dL). ------------------------------------------------------------------------------------------------------------------ No results for input(s): TSH, T4TOTAL, T3FREE, THYROIDAB in the last 72 hours.  Invalid input(s): FREET3   Coagulation profile No results for input(s): INR, PROTIME in the last 168 hours. ------------------------------------------------------------------------------------------------------------------- No  results for input(s): DDIMER in the last 72 hours. -------------------------------------------------------------------------------------------------------------------  Cardiac Enzymes Recent Labs  Lab 11/29/17 0508  TROPONINI <0.03   ------------------------------------------------------------------------------------------------------------------ Invalid input(s): POCBNP  ---------------------------------------------------------------------------------------------------------------  Urinalysis    Component Value Date/Time   COLORURINE YELLOW (A) 05/17/2017 0935   APPEARANCEUR CLEAR (A) 05/17/2017 0935   LABSPEC 1.016 05/17/2017 0935   PHURINE 5.0 05/17/2017 0935   GLUCOSEU NEGATIVE 05/17/2017 0935   HGBUR NEGATIVE 05/17/2017 0935   BILIRUBINUR NEGATIVE 05/17/2017 0935   KETONESUR NEGATIVE 05/17/2017 0935   PROTEINUR NEGATIVE 05/17/2017 0935   NITRITE NEGATIVE 05/17/2017 0935   LEUKOCYTESUR NEGATIVE 05/17/2017 0935     RADIOLOGY: Dg Abdomen 1 View  Result Date: 11/29/2017 CLINICAL DATA:  Nasogastric tube placement EXAM: ABDOMEN - 1 VIEW COMPARISON:  CT abdomen and pelvis November 29, 2017 FINDINGS: Nasogastric tube tip and side port are in the stomach. There is no bowel dilatation or air-fluid level to suggest bowel obstruction. No free air. There are contrast filled scattered colonic diverticula. Contrast is noted in each renal collecting system and urinary bladder. IMPRESSION: Nasogastric tube tip and side port in stomach. No bowel obstruction or free air evident on this supine examination. Electronically Signed   By: Lowella Grip III M.D.   On: 11/29/2017 09:48   Dg Chest Portable 1 View  Result Date: 11/29/2017 CLINICAL DATA:  Preoperative evaluation. History of breast carcinoma. Chest pain. EXAM: PORTABLE CHEST 1 VIEW COMPARISON:  Chest radiograph and chest CT November 06, 2017 FINDINGS: No edema or consolidation. Heart is mildly enlarged with pulmonary vascularity  normal. No adenopathy. No pneumothorax. There is postoperative change in each shoulder. No evident blastic or lytic bone lesions. IMPRESSION: No edema or consolidation. Stable cardiac silhouette. No evident adenopathy. Electronically Signed   By: Lowella Grip III M.D.   On: 11/29/2017 08:29   Ct Angio Chest/abd/pel For Dissection W And/or Wo Contrast  Result Date: 11/29/2017 CLINICAL DATA:  Epigastric chest pain starting today. Radiates to the left arm. History of breast cancer. Hemodynamically stable. EXAM: CT ANGIOGRAPHY CHEST, ABDOMEN AND PELVIS TECHNIQUE: Multidetector CT imaging through the chest, abdomen and pelvis was performed using the standard protocol during bolus administration of intravenous contrast. Multiplanar reconstructed images and MIPs were obtained and reviewed to evaluate the vascular anatomy. CONTRAST:  119mL ISOVUE-370 IOPAMIDOL (ISOVUE-370) INJECTION 76% COMPARISON:  CT chest 11/06/2017 FINDINGS: CTA CHEST FINDINGS Cardiovascular: Noncontrast CT images of the chest demonstrate calcification of the aorta. No evidence of intramural hematoma. Coronary artery calcifications. Images obtained during arterial phase after contrast administration demonstrate homogeneous enhancement of the thoracic aorta. No evidence of aortic dissection. Dilated ascending thoracic aorta measuring 4.1 cm diameter. No change since previous study. Great vessel origins are patent. Good opacification of the central pulmonary arteries without evidence of significant pulmonary embolus. Normal heart size. No pericardial effusion. Mediastinum/Nodes: Esophagus is decompressed. No significant lymphadenopathy in the chest. Thyroid gland is unremarkable. Lungs/Pleura: Emphysematous changes in the lungs. No airspace disease or consolidation. No pleural effusions. No pneumothorax.  Airways are patent. Musculoskeletal: Postoperative changes in both shoulders. Mild degenerative changes in the spine. Review of the MIP images  confirms the above findings. CTA ABDOMEN AND PELVIS FINDINGS VASCULAR Aorta: Normal caliber abdominal aorta with calcification. No dissection. Celiac: Patent without evidence of aneurysm, dissection, vasculitis or significant stenosis. SMA: Patent without evidence of aneurysm, dissection, vasculitis or significant stenosis. Renals: Both renal arteries are patent without evidence of aneurysm, dissection, vasculitis, fibromuscular dysplasia or significant stenosis. IMA: Patent without evidence of aneurysm, dissection, vasculitis or significant stenosis. Inflow: Patent without evidence of aneurysm, dissection, vasculitis or significant stenosis. Veins: No obvious venous abnormality within the limitations of this arterial phase study. Heterogeneous appearance of the superior mesenteric vein and portal veins likely represents mixing of non-opacified blood in the early arterial phase. Review of the MIP images confirms the above findings. NON-VASCULAR Hepatobiliary: Changes of hepatic cirrhosis with enlarged lateral segment left lobe and caudate lobes and with diffuse nodular contour to the liver. No focal lesions are appreciated. Gallbladder is surgically absent. No bile duct dilatation. Pancreas: Unremarkable. No pancreatic ductal dilatation or surrounding inflammatory changes. Spleen: Normal in size without focal abnormality. Adrenals/Urinary Tract: No adrenal gland nodules. Small bilateral parenchymal renal cysts. Scarring of the right kidney. Nephrograms are symmetrical. No hydronephrosis or hydroureter. Bladder is unremarkable. Stomach/Bowel: Stomach is not abnormally distended. There are dilated fluid-filled small bowel loops extending into the pelvis. Terminal ileum is decompressed. Transition zone appears to be in the right lower quadrant. At the probable level of the obstruction, there is a vaguely hyperenhancing focus. This could indicate intussusceptions or small bowel mass. No small bowel wall is present  although bowel hyperemia present. No pneumatosis. Fluid in the mesentery. Diverticulosis of the sigmoid colon without evidence of diverticulitis. Appendix is normal. Lymphatic: No significant lymphadenopathy. Reproductive: Status post hysterectomy. No adnexal masses. Other: Small amount of free fluid in the upper abdomen, pericolic gutters, and pelvis likely representing ascites. No free air in the abdomen. Abdominal wall musculature appears intact. Musculoskeletal: Degenerative changes in the spine. No destructive bone lesions. Review of the MIP images confirms the above findings. IMPRESSION: CTA CHEST 1. No evidence of aortic dissection or pulmonary embolus. 2. Dilated ascending thoracic aorta measuring up to 4.1 cm. Recommend annual imaging followup by CTA or MRA. This recommendation follows 2010 ACCF/AHA/AATS/ACR/ASA/SCA/SCAI/SIR/STS/SVM Guidelines for the Diagnosis and Management of Patients with Thoracic Aortic Disease. 2010; 121: W258-N277. 3. Coronary artery calcifications. 4. Emphysematous changes in the lungs. No active pulmonary disease. CTA ABDOMEN AND PELVIS 1. Small bowel obstruction with transition zone in the low pelvis. At the transition zone, there is a focal area of hyperemic soft tissue. This could indicate an intussusception or a small bowel mass. 2. Cirrhosis with diffuse nodular contour to the liver. 3. No evidence of abdominal aortic aneurysm. 4. Colonic diverticulosis without evidence of diverticulitis. 5. Status post cholecystectomy and hysterectomy. Aortic Atherosclerosis (ICD10-I70.0) and Emphysema (ICD10-J43.9). Electronically Signed   By: Lucienne Capers M.D.   On: 11/29/2017 06:46    EKG: Orders placed or performed during the hospital encounter of 11/29/17  . ED EKG  . ED EKG  . EKG 12-Lead  . EKG 12-Lead    IMPRESSION AND PLAN: *Acute partial small bowel obstruction with transition point within the pelvis General surgery input appreciated-conservative medical  recommended Admit to regular nursing floor bed, n.p.o. except for meds, IV fluids for rehydration, antiemetics PRN, adult pain protocol, strict I&O monitoring, general surgery consulted, and continue close medical monitoring  *Chronic benign  essential hypertension Stable Continue Norvasc  *Acute hypokalemia Replete with p.o. Potassium  *Abnormal CT of the chest Noted for a sending thoracic aortic aneurysm 4.1 cm Will need to follow-up with vascular surgery status post discharge for continued surveillance/management  *Chronic GERD without esophagitis PPI daily    All the records are reviewed and case discussed with ED provider. Management plans discussed with the patient, family and they are in agreement.  CODE STATUS:full Code Status History    Date Active Date Inactive Code Status Order ID Comments User Context   05/30/2017 1027 06/01/2017 1831 Full Code [id number]  Hessie Knows, MD Inpatient   08/30/2016 1444 08/30/2016 1855 Full Code [id number]  Hessie Knows, MD Inpatient       TOTAL TIME TAKING CARE OF THIS PATIENT: 45 minutes.    Avel Peace Salary M.D on 11/29/2017   Between 7am to 6pm - Pager - [phone]  After 6pm go to [email or web address] - password [email or web address] [facility]  Office  [phone]  CC: Primary care physician; Juline Patch, MD   Note: This dictation was prepared with Dragon dictation along with smaller phrase technology. Any transcriptional errors that result from this process are unintentional.

## 2017-11-29 NOTE — ED Notes (Signed)
Dr. Burt Knack at bedside.   Pt ambulated to toilet with this RN. Had normal BM then began to vomit, when she threw up she had diarrhea as well. Changed pt gown and gave new emesis bag.

## 2017-11-29 NOTE — Progress Notes (Signed)
Patient seen and examined.  Full consult to follow.  Nondistended nontympanitic and nontender abdomen.  Pfannenstiel scar and laparoscopy scars present.  Suspect adhesions deep in the pelvis secondary to prior hysterectomy.  No obvious signs that this is a malignancy.  Would treat this as typical adhesive small bowel obstruction for now with a nasogastric tube.  This was discussed with nursing and the ER physician.  Will follow with the admitting team as she has significant cardiac disease and in fact has a stress test scheduled for tomorrow and has left arm pain today.

## 2017-11-29 NOTE — ED Triage Notes (Signed)
Here for epigastric type chest pain starting today.  Radiates to left arm. Denies back pain. No fever.

## 2017-11-29 NOTE — ED Provider Notes (Signed)
[facility] Emergency Department Provider Note    First MD Initiated Contact with Patient 11/29/17 [phone]     (approximate)  I have reviewed the triage vital signs and the nursing notes.   HISTORY  Chief Complaint Chest Pain    HPI Kaitlyn Oliver is a 84 y.o. female since the ER with chief complaint of midepigastric chest pain that started last night radiating through to her back associate with some nausea but no vomiting.  Patient went to visit to the ER with recent diagnosis of ascending aortic aneurysm.  Describes the pain is sharp lancinating.  Moderate in severity.  No fevers.  Last vomit was yesterday.  Denies any dysuria.  No flank pain.    Past Medical History:  Diagnosis Date  . Anxiety   . Arthritis    thumbs, hands  . Cancer [facility]) 1991   bilateral breast  . Depression   . GERD (gastroesophageal reflux disease)   . Headache    occasional - pinched nerve in neck  . Hypertension   . Hypothyroidism   . Thyroid disease   . Wears dentures    full upper, partial lower   Family History  Problem Relation Age of Onset  . Pulmonary embolism Mother   . Healthy Father    Past Surgical History:  Procedure Laterality Date  . BREAST SURGERY Bilateral 1991   mastectomy  . CATARACT EXTRACTION Bilateral   . CHOLECYSTECTOMY    . COLONOSCOPY  2013   Dr Gustavo Lah  . HALLUX VALGUS AUSTIN Right 06/03/2015   Procedure: HALLUX VALGUS AUSTIN;  Surgeon: Samara Deist, DPM;  Location: [place];  Service: Podiatry;  Laterality: Right;  WITH POPLITEAL  . KNEE ARTHROSCOPY WITH MEDIAL MENISECTOMY Right 08/30/2016   Procedure: KNEE ARTHROSCOPY WITH PARTIAL MEDIAL MENISECTOMY and Partial Lateral Menisectomy;  Surgeon: Hessie Knows, MD;  Location: [facility];  Service: Orthopedics;  Laterality: Right;  . KNEE ARTHROSCOPY WITH SUBCHONDROPLASTY Right 08/30/2016   Procedure: KNEE ARTHROSCOPY WITH SUBCHONDROPLASTY;  Surgeon: Hessie Knows, MD;  Location:  [facility];  Service: Orthopedics;  Laterality: Right;  . MASTECTOMY Bilateral   . ROTATOR CUFF REPAIR Bilateral   . TONSILLECTOMY    . TOTAL KNEE ARTHROPLASTY Left   . TOTAL KNEE ARTHROPLASTY Right 05/30/2017   Procedure: TOTAL KNEE ARTHROPLASTY;  Surgeon: Hessie Knows, MD;  Location: [facility];  Service: Orthopedics;  Laterality: Right;  Marland Kitchen VAGINAL HYSTERECTOMY     Patient Active Problem List   Diagnosis Date Noted  . Thoracoabdominal aortic aneurysm (TAAA) ([place]) 11/19/2017  . Status post total knee replacement using cement, right 05/30/2017  . GERD (gastroesophageal reflux disease) 12/22/2015  . Adenomatous polyp 09/11/2014  . Bilateral cataracts 09/11/2014  . Gastric catarrh 09/11/2014  . Gastroduodenal ulcer 09/11/2014  . Hypothyroidism 09/11/2014  . Cardiac murmur 10/08/2013  . Essential (primary) hypertension 10/08/2013  . Combined fat and carbohydrate induced hyperlipemia 10/08/2013      Prior to Admission medications   Medication Sig Start Date End Date Taking? Authorizing Provider  acetaminophen (TYLENOL) 325 MG tablet Take 1-2 tablets (325-650 mg total) by mouth every 6 (six) hours as needed for mild pain (pain score 1-3 or temp > 100.5). Patient not taking: Reported on 11/27/2017 06/01/17   Duanne Guess, PA-C  amLODipine (NORVASC) 2.5 MG tablet Take 2.5 mg by mouth daily. 11/16/17   [provider]  busPIRone (BUSPAR) 5 MG tablet Take 1 tablet (5 mg total) by mouth 2 (two) times daily. 12/22/15  Juline Patch, MD  celecoxib (CELEBREX) 200 MG capsule TAKE 1 CAPSULE BY MOUTH ONCE DAILY AS NEEDED FOR MODERATE PAIN 10/24/17   [provider]  chlorthalidone (HYGROTON) 25 MG tablet Take 1 tablet (25 mg total) by mouth daily. Patient not taking: Reported on 11/27/2017 02/15/17   Juline Patch, MD  methocarbamol (ROBAXIN) 500 MG tablet  06/01/17   [provider]  nystatin cream (MYCOSTATIN) Apply 1 application topically 2 (two) times daily. 08/15/17    Juline Patch, MD  pantoprazole (PROTONIX) 40 MG tablet Take by mouth. 11/09/17 11/09/18  [provider]  Probiotic CAPS Take 1 capsule by mouth daily as needed (diarrhea).    [provider]  triamcinolone (NASACORT ALLERGY 24HR) 55 MCG/ACT AERO nasal inhaler Place 2 sprays into the nose daily. Patient taking differently: Place 2 sprays into the nose daily as needed (allergies).  12/22/15   Juline Patch, MD    Allergies Adhesive [tape]; Penicillin g; and Hydrocodone-acetaminophen    Social History Social History   Tobacco Use  . Smoking status: Former Smoker    Packs/day: 0.25    Years: 10.00    Pack years: 2.50    Types: Cigarettes    Last attempt to quit: 1991    Years since quitting: 28.7  . Smokeless tobacco: Never Used  . Tobacco comment: smoking cessation materials not required  Substance Use Topics  . Alcohol use: No    Alcohol/week: 0.0 standard drinks  . Drug use: No    Review of Systems Patient denies headaches, rhinorrhea, blurry vision, numbness, shortness of breath, chest pain, edema, cough, abdominal pain, nausea, vomiting, diarrhea, dysuria, fevers, rashes or hallucinations unless otherwise stated above in HPI. ____________________________________________   PHYSICAL EXAM:  VITAL SIGNS: Vitals:   11/29/17 0604 11/29/17 0630  BP: 106/82 (!) 159/83  Pulse: 62 66  Resp: 19 17  Temp:    SpO2: 98% 95%    Constitutional: Alert and oriented.  Eyes: Conjunctivae are normal.  Head: Atraumatic. Nose: No congestion/rhinnorhea. Mouth/Throat: Mucous membranes are moist.   Neck: No stridor. Painless ROM.  Cardiovascular: Normal rate, regular rhythm. Grossly normal heart sounds.  Good peripheral circulation. Respiratory: Normal respiratory effort.  No retractions. Lungs CTAB. Gastrointestinal: Soft with diffuse ttp. No distention. No abdominal bruits. No CVA tenderness. Genitourinary:  Musculoskeletal: No lower extremity tenderness nor  edema.  No joint effusions. Neurologic:  Normal speech and language. No gross focal neurologic deficits are appreciated. No facial droop Skin:  Skin is warm, dry and intact. No rash noted. Psychiatric: Mood and affect are normal. Speech and behavior are normal.  ____________________________________________   LABS (all labs ordered are listed, but only abnormal results are displayed)  Results for orders placed or performed during the hospital encounter of 11/29/17 (from the past 24 hour(s))  Troponin I     Status: None   Collection Time: 11/29/17  5:08 AM  Result Value Ref Range   Troponin I <0.03 <0.03 ng/mL  CBC with Differential/Platelet     Status: Abnormal   Collection Time: 11/29/17  5:08 AM  Result Value Ref Range   WBC 5.8 3.6 - 11.0 K/uL   RBC 4.39 3.80 - 5.20 MIL/uL   Hemoglobin 13.4 12.0 - 16.0 g/dL   HCT 39.4 35.0 - 47.0 %   MCV 89.7 80.0 - 100.0 fL   MCH 30.5 26.0 - 34.0 pg   MCHC 34.0 32.0 - 36.0 g/dL   RDW 15.4 (H) 11.5 - 14.5 %  Platelets 125 (L) 150 - 440 K/uL   Neutrophils Relative % 76 %   Neutro Abs 4.4 1.4 - 6.5 K/uL   Lymphocytes Relative 18 %   Lymphs Abs 1.1 1.0 - 3.6 K/uL   Monocytes Relative 5 %   Monocytes Absolute 0.3 0.2 - 0.9 K/uL   Eosinophils Relative 1 %   Eosinophils Absolute 0.1 0 - 0.7 K/uL   Basophils Relative 0 %   Basophils Absolute 0.0 0 - 0.1 K/uL  Comprehensive metabolic panel     Status: Abnormal   Collection Time: 11/29/17  5:08 AM  Result Value Ref Range   Sodium 138 135 - 145 mmol/L   Potassium 3.3 (L) 3.5 - 5.1 mmol/L   Chloride 102 98 - 111 mmol/L   CO2 24 22 - 32 mmol/L   Glucose, Bld 162 (H) 70 - 99 mg/dL   BUN 15 8 - 23 mg/dL   Creatinine, Ser 0.91 0.44 - 1.00 mg/dL   Calcium 9.9 8.9 - 10.3 mg/dL   Total Protein 7.2 6.5 - 8.1 g/dL   Albumin 4.0 3.5 - 5.0 g/dL   AST 47 (H) 15 - 41 U/L   ALT 46 (H) 0 - 44 U/L   Alkaline Phosphatase 57 38 - 126 U/L   Total Bilirubin 0.8 0.3 - 1.2 mg/dL   GFR calc non Af Amer 57 (L)  >60 mL/min   GFR calc Af Amer >60 >60 mL/min   Anion gap 12 5 - 15   ____________________________________________  EKG My review and personal interpretation at Time: 5:00   Indication: chest pain  Rate: 60  Rhythm: sinus Axis: normal Other: normal intervals, no stemi ____________________________________________  RADIOLOGY  I personally reviewed all radiographic images ordered to evaluate for the above acute complaints and reviewed radiology reports and findings.  These findings were personally discussed with the patient.  Please see medical record for radiology report.  ____________________________________________   PROCEDURES  Procedure(s) performed:  Procedures    Critical Care performed: no ____________________________________________   INITIAL IMPRESSION / ASSESSMENT AND PLAN / ED COURSE  Pertinent labs & imaging results that were available during my care of the patient were reviewed by me and considered in my medical decision making (see chart for details).   DDX: dissection, sbo, gastritis, enteritis, acs  Jamira Bernardina Cacho is a 84 y.o. who presents to the ED with abdominal and chest pain as described above.  Patient is AFVSS in ED. Exam as above. Given current presentation have considered the above differential.  CT imaging ordered immediately eval for dissection.  EKG shows no evidence of STEMI.  The patient will be placed on continuous pulse oximetry and telemetry for monitoring.  Laboratory evaluation will be sent to evaluate for the above complaints.     Clinical Course as of Nov 30 746  Wed Nov 29, 2017  0704 CT imaging does show evidence of small bowel obstruction with transition point.  Discussed with dr. Burt Knack of general surgery.  Will also consult hospitalist for further evaluation and medical optimization.   [PR]    Clinical Course User Index [PR] Merlyn Lot, MD     As part of my medical decision making, I reviewed the following data within  the [place] notes reviewed and incorporated, Labs reviewed, notes from prior ED visits.  ____________________________________________   FINAL CLINICAL IMPRESSION(S) / ED DIAGNOSES  Final diagnoses:  Small bowel obstruction ([place])      NEW MEDICATIONS STARTED DURING THIS  VISIT:  New Prescriptions   No medications on file     Note:  This document was prepared using Dragon voice recognition software and may include unintentional dictation errors.    Merlyn Lot, MD 11/29/17 [phone]

## 2017-11-29 NOTE — Progress Notes (Signed)
Chaplain was rounding and was referred by unit secretary for the length of stay (7 hours). Chaplain offered hospitality or support.  Family was at the bedside and thanked chaplain for coming by. When Chaplain mentioned they had been here for a long time they responded. They said they didn't need hospitality but desired prayer fo wisdom of what is causing her pain. Pt and family was thankful for the visit and thanked the chaplain for our work.    11/29/17 1100  Clinical Encounter Type  Visited With Patient and family together  Visit Type Initial;Spiritual support  Referral From Nurse

## 2017-11-29 NOTE — ED Notes (Signed)
Patient assisted to use restroom. Ambulatory to toilet with standby assistance and steady gait.

## 2017-11-29 NOTE — Consult Note (Signed)
Surgical Consultation  11/29/2017  Kaitlyn Oliver is an 84 y.o. female.   Referring Physician: ER physician and admitting physician  CC: Epigastric and left arm pain  HPI: This a patient was in her usual state of health yesterday and then started experiencing some upper abdominal pain in the epigastrium that radiated around her left side and through to her back and extended into her left arm.  A work-up in the emergency room for cardiac disease was negative and a scan was obtained suggestive of bowel obstruction on CT scan only.  Upon my arrival to the emergency room the patient had just vomited for the very first time and was complaining of epigastric pain she had no low abdominal pain in the left arm pain was persistent.  Denies fevers or chills and is having diarrhea which she has had for about a year now no melena or hematochezia.  Of note is she has a stress test scheduled for tomorrow for work-up of her cardiac disease.  Past Medical History:  Diagnosis Date  . Anxiety   . Arthritis    thumbs, hands  . Cancer [facility]) 1991   bilateral breast  . Depression   . GERD (gastroesophageal reflux disease)   . Headache    occasional - pinched nerve in neck  . Hypertension   . Hypothyroidism   . Thyroid disease   . Wears dentures    full upper, partial lower    Past Surgical History:  Procedure Laterality Date  . BREAST SURGERY Bilateral 1991   mastectomy  . CATARACT EXTRACTION Bilateral   . CHOLECYSTECTOMY    . COLONOSCOPY  2013   Dr Gustavo Lah  . HALLUX VALGUS AUSTIN Right 06/03/2015   Procedure: HALLUX VALGUS AUSTIN;  Surgeon: Samara Deist, DPM;  Location: [place];  Service: Podiatry;  Laterality: Right;  WITH POPLITEAL  . KNEE ARTHROSCOPY WITH MEDIAL MENISECTOMY Right 08/30/2016   Procedure: KNEE ARTHROSCOPY WITH PARTIAL MEDIAL MENISECTOMY and Partial Lateral Menisectomy;  Surgeon: Hessie Knows, MD;  Location: [facility];  Service: Orthopedics;  Laterality:  Right;  . KNEE ARTHROSCOPY WITH SUBCHONDROPLASTY Right 08/30/2016   Procedure: KNEE ARTHROSCOPY WITH SUBCHONDROPLASTY;  Surgeon: Hessie Knows, MD;  Location: [facility];  Service: Orthopedics;  Laterality: Right;  . MASTECTOMY Bilateral   . ROTATOR CUFF REPAIR Bilateral   . TONSILLECTOMY    . TOTAL KNEE ARTHROPLASTY Left   . TOTAL KNEE ARTHROPLASTY Right 05/30/2017   Procedure: TOTAL KNEE ARTHROPLASTY;  Surgeon: Hessie Knows, MD;  Location: [facility];  Service: Orthopedics;  Laterality: Right;  Marland Kitchen VAGINAL HYSTERECTOMY      Family History  Problem Relation Age of Onset  . Pulmonary embolism Mother   . Healthy Father     Social History:  reports that she quit smoking about 28 years ago. Her smoking use included cigarettes. She has a 2.50 pack-year smoking history. She has never used smokeless tobacco. She reports that she does not drink alcohol or use drugs.  Allergies:  Allergies  Allergen Reactions  . Adhesive [Tape] Dermatitis    Can not use plastic tape. Paper tape ok   . Penicillin G Itching    Has patient had a PCN reaction causing immediate rash, facial/tongue/throat swelling, SOB or lightheadedness with hypotension: No Has patient had a PCN reaction causing severe rash involving mucus membranes or skin necrosis: No Has patient had a PCN reaction that required hospitalization: No Has patient had a PCN reaction occurring within the last 10 years: No If all  of the above answers are "NO", then may proceed with Cephalosporin use.   Marland Kitchen Hydrocodone-Acetaminophen Itching and Rash    Medications reviewed.   Review of Systems:   Review of Systems  Constitutional: Negative for chills and fever.  HENT: Negative.   Eyes: Negative.   Respiratory: Negative for cough, hemoptysis and sputum production.   Cardiovascular: Positive for chest pain and leg swelling. Negative for PND.  Gastrointestinal: Positive for abdominal pain, diarrhea, nausea and vomiting. Negative for blood in stool,  heartburn and melena.  Genitourinary: Negative.   Musculoskeletal: Negative.   Skin: Negative.   Neurological: Negative.   Endo/Heme/Allergies: Negative.   Psychiatric/Behavioral: Negative.      Physical Exam:  BP 128/75   Pulse 71   Temp 97.6 F (36.4 C) (Oral)   Resp 19   Wt 90.7 kg   SpO2 93%   BMI 33.28 kg/m   Physical Exam  Constitutional: She is oriented to person, place, and time. She appears well-developed and well-nourished.  Non-toxic appearance. She does not appear ill. No distress.  Patient does not appear uncomfortable at this time but she is sitting up holding an emesis bag.  She states that she vomited only one time just a few minutes before my arrival.  The room smells of diarrhea.  HENT:  Head: Normocephalic and atraumatic.  Eyes: Pupils are equal, round, and reactive to light. EOM are normal.  Neck: Normal range of motion. Neck supple.  Cardiovascular: Normal rate and regular rhythm.  Pulmonary/Chest: Effort normal and breath sounds normal. No stridor. No respiratory distress.  Abdominal: Soft. She exhibits no distension. There is no tenderness.  Abdomen is soft nondistended nontympanitic and nontender.  There is a Pfannenstiel incision and laparoscopy incisions.  No mass palpable.  Musculoskeletal:       Right lower leg: She exhibits edema.  Neurological: She is alert and oriented to person, place, and time.  Skin: Skin is warm and dry. No erythema.  Vitals reviewed.     Results for orders placed or performed during the hospital encounter of 11/29/17 (from the past 48 hour(s))  Troponin I     Status: None   Collection Time: 11/29/17  5:08 AM  Result Value Ref Range   Troponin I <0.03 <0.03 ng/mL    Comment: Performed at [facility], [address] [place], [place] [address]  CBC with Differential/Platelet     Status: Abnormal   Collection Time: 11/29/17  5:08 AM  Result Value Ref Range   WBC 5.8 3.6 - 11.0 K/uL   RBC 4.39 3.80 -  5.20 MIL/uL   Hemoglobin 13.4 12.0 - 16.0 g/dL   HCT 39.4 35.0 - 47.0 %   MCV 89.7 80.0 - 100.0 fL   MCH 30.5 26.0 - 34.0 pg   MCHC 34.0 32.0 - 36.0 g/dL   RDW 15.4 (H) 11.5 - 14.5 %   Platelets 125 (L) 150 - 440 K/uL   Neutrophils Relative % 76 %   Neutro Abs 4.4 1.4 - 6.5 K/uL   Lymphocytes Relative 18 %   Lymphs Abs 1.1 1.0 - 3.6 K/uL   Monocytes Relative 5 %   Monocytes Absolute 0.3 0.2 - 0.9 K/uL   Eosinophils Relative 1 %   Eosinophils Absolute 0.1 0 - 0.7 K/uL   Basophils Relative 0 %   Basophils Absolute 0.0 0 - 0.1 K/uL    Comment: Performed at [facility], [address] [place], [place] [address]  Comprehensive metabolic panel  Status: Abnormal   Collection Time: 11/29/17  5:08 AM  Result Value Ref Range   Sodium 138 135 - 145 mmol/L   Potassium 3.3 (L) 3.5 - 5.1 mmol/L   Chloride 102 98 - 111 mmol/L   CO2 24 22 - 32 mmol/L   Glucose, Bld 162 (H) 70 - 99 mg/dL   BUN 15 8 - 23 mg/dL   Creatinine, Ser 0.91 0.44 - 1.00 mg/dL   Calcium 9.9 8.9 - 10.3 mg/dL   Total Protein 7.2 6.5 - 8.1 g/dL   Albumin 4.0 3.5 - 5.0 g/dL   AST 47 (H) 15 - 41 U/L   ALT 46 (H) 0 - 44 U/L   Alkaline Phosphatase 57 38 - 126 U/L   Total Bilirubin 0.8 0.3 - 1.2 mg/dL   GFR calc non Af Amer 57 (L) >60 mL/min   GFR calc Af Amer >60 >60 mL/min    Comment: (NOTE) The eGFR has been calculated using the CKD EPI equation. This calculation has not been validated in all clinical situations. eGFR's persistently <60 mL/min signify possible Chronic Kidney Disease.    Anion gap 12 5 - 15    Comment: Performed at [facility], [place], [place] [address]   Dg Abdomen 1 View  Result Date: 11/29/2017 CLINICAL DATA:  Nasogastric tube placement EXAM: ABDOMEN - 1 VIEW COMPARISON:  CT abdomen and pelvis November 29, 2017 FINDINGS: Nasogastric tube tip and side port are in the stomach. There is no bowel dilatation or air-fluid level to suggest bowel obstruction. No  free air. There are contrast filled scattered colonic diverticula. Contrast is noted in each renal collecting system and urinary bladder. IMPRESSION: Nasogastric tube tip and side port in stomach. No bowel obstruction or free air evident on this supine examination. Electronically Signed   By: Lowella Grip III M.D.   On: 11/29/2017 09:48   Dg Chest Portable 1 View  Result Date: 11/29/2017 CLINICAL DATA:  Preoperative evaluation. History of breast carcinoma. Chest pain. EXAM: PORTABLE CHEST 1 VIEW COMPARISON:  Chest radiograph and chest CT November 06, 2017 FINDINGS: No edema or consolidation. Heart is mildly enlarged with pulmonary vascularity normal. No adenopathy. No pneumothorax. There is postoperative change in each shoulder. No evident blastic or lytic bone lesions. IMPRESSION: No edema or consolidation. Stable cardiac silhouette. No evident adenopathy. Electronically Signed   By: Lowella Grip III M.D.   On: 11/29/2017 08:29   Ct Angio Chest/abd/pel For Dissection W And/or Wo Contrast  Result Date: 11/29/2017 CLINICAL DATA:  Epigastric chest pain starting today. Radiates to the left arm. History of breast cancer. Hemodynamically stable. EXAM: CT ANGIOGRAPHY CHEST, ABDOMEN AND PELVIS TECHNIQUE: Multidetector CT imaging through the chest, abdomen and pelvis was performed using the standard protocol during bolus administration of intravenous contrast. Multiplanar reconstructed images and MIPs were obtained and reviewed to evaluate the vascular anatomy. CONTRAST:  158m ISOVUE-370 IOPAMIDOL (ISOVUE-370) INJECTION 76% COMPARISON:  CT chest 11/06/2017 FINDINGS: CTA CHEST FINDINGS Cardiovascular: Noncontrast CT images of the chest demonstrate calcification of the aorta. No evidence of intramural hematoma. Coronary artery calcifications. Images obtained during arterial phase after contrast administration demonstrate homogeneous enhancement of the thoracic aorta. No evidence of aortic dissection. Dilated  ascending thoracic aorta measuring 4.1 cm diameter. No change since previous study. Great vessel origins are patent. Good opacification of the central pulmonary arteries without evidence of significant pulmonary embolus. Normal heart size. No pericardial effusion. Mediastinum/Nodes: Esophagus is decompressed. No significant lymphadenopathy in the  chest. Thyroid gland is unremarkable. Lungs/Pleura: Emphysematous changes in the lungs. No airspace disease or consolidation. No pleural effusions. No pneumothorax. Airways are patent. Musculoskeletal: Postoperative changes in both shoulders. Mild degenerative changes in the spine. Review of the MIP images confirms the above findings. CTA ABDOMEN AND PELVIS FINDINGS VASCULAR Aorta: Normal caliber abdominal aorta with calcification. No dissection. Celiac: Patent without evidence of aneurysm, dissection, vasculitis or significant stenosis. SMA: Patent without evidence of aneurysm, dissection, vasculitis or significant stenosis. Renals: Both renal arteries are patent without evidence of aneurysm, dissection, vasculitis, fibromuscular dysplasia or significant stenosis. IMA: Patent without evidence of aneurysm, dissection, vasculitis or significant stenosis. Inflow: Patent without evidence of aneurysm, dissection, vasculitis or significant stenosis. Veins: No obvious venous abnormality within the limitations of this arterial phase study. Heterogeneous appearance of the superior mesenteric vein and portal veins likely represents mixing of non-opacified blood in the early arterial phase. Review of the MIP images confirms the above findings. NON-VASCULAR Hepatobiliary: Changes of hepatic cirrhosis with enlarged lateral segment left lobe and caudate lobes and with diffuse nodular contour to the liver. No focal lesions are appreciated. Gallbladder is surgically absent. No bile duct dilatation. Pancreas: Unremarkable. No pancreatic ductal dilatation or surrounding inflammatory  changes. Spleen: Normal in size without focal abnormality. Adrenals/Urinary Tract: No adrenal gland nodules. Small bilateral parenchymal renal cysts. Scarring of the right kidney. Nephrograms are symmetrical. No hydronephrosis or hydroureter. Bladder is unremarkable. Stomach/Bowel: Stomach is not abnormally distended. There are dilated fluid-filled small bowel loops extending into the pelvis. Terminal ileum is decompressed. Transition zone appears to be in the right lower quadrant. At the probable level of the obstruction, there is a vaguely hyperenhancing focus. This could indicate intussusceptions or small bowel mass. No small bowel wall is present although bowel hyperemia present. No pneumatosis. Fluid in the mesentery. Diverticulosis of the sigmoid colon without evidence of diverticulitis. Appendix is normal. Lymphatic: No significant lymphadenopathy. Reproductive: Status post hysterectomy. No adnexal masses. Other: Small amount of free fluid in the upper abdomen, pericolic gutters, and pelvis likely representing ascites. No free air in the abdomen. Abdominal wall musculature appears intact. Musculoskeletal: Degenerative changes in the spine. No destructive bone lesions. Review of the MIP images confirms the above findings. IMPRESSION: CTA CHEST 1. No evidence of aortic dissection or pulmonary embolus. 2. Dilated ascending thoracic aorta measuring up to 4.1 cm. Recommend annual imaging followup by CTA or MRA. This recommendation follows 2010 ACCF/AHA/AATS/ACR/ASA/SCA/SCAI/SIR/STS/SVM Guidelines for the Diagnosis and Management of Patients with Thoracic Aortic Disease. 2010; 121: [id number]. 3. Coronary artery calcifications. 4. Emphysematous changes in the lungs. No active pulmonary disease. CTA ABDOMEN AND PELVIS 1. Small bowel obstruction with transition zone in the low pelvis. At the transition zone, there is a focal area of hyperemic soft tissue. This could indicate an intussusception or a small bowel mass.  2. Cirrhosis with diffuse nodular contour to the liver. 3. No evidence of abdominal aortic aneurysm. 4. Colonic diverticulosis without evidence of diverticulitis. 5. Status post cholecystectomy and hysterectomy. Aortic Atherosclerosis (ICD10-I70.0) and Emphysema (ICD10-J43.9). Electronically Signed   By: Lucienne Capers M.D.   On: 11/29/2017 06:46    Assessment/Plan: Labs and CT scan personally reviewed.  There is a question of a mass affecting a bowel obstruction in the small bowel well deep in the pelvis.  Her stomach is mildly distended as are some loops of small bowel.  Also some free fluid around the liver.  Is of unclear etiology.  Patient presented with epigastric pain chest pain left  arm pain and back pain and a work-up for a vascular abnormality was negative.  She does have significant coronary artery disease and in fact Dr. Montine Circle had her scheduled for a stress test tomorrow.  This patient is to be admitted by prime doc for further evaluation.  A small bowel obstruction was identified on CT scan and until late in her emergency room course she had not vomited at all and in fact had not had any nausea.  On review of the CT scan there appears to be some dilated bowel and a transition point well deep in the pelvis which is far away from her source of pain.  I see no sign of distention tympany or tenderness on Randall Hiss doubt abdominal exam.  Because she has vomited because she has some dilated bowel I would place a nasogastric tube and I spoke to the nurse concerning this.  While there is a question of a mass deep in the pelvis this patient has had a hysterectomy and I would treat this at least initially as a typical small bowel obstruction secondary to adhesions due to the location of the transition point.  This does not explain her pain nor does explain the fluid around her liver or her chief complaints.  I will follow the patient while she is being worked up by the Herbalist.  This was  discussed with nursing and the emergency room physician.  Florene Glen, MD, FACS

## 2017-11-29 NOTE — Progress Notes (Signed)
Family Meeting Note  Advance Directive:yes  Today a meeting took place with the Patient.  Patient is able to participate   The following clinical team members were present during this meeting:MD  The following were discussed:Patient's diagnosis:SBO , Patient's progosis: Unable to determine and Goals for treatment: Full Code  Additional follow-up to be provided: prn  Time spent during discussion:20 minutes  Gorden Harms, MD

## 2017-11-30 ENCOUNTER — Inpatient Hospital Stay: Payer: Medicare HMO

## 2017-11-30 LAB — BASIC METABOLIC PANEL
Anion gap: 8 (ref 5–15)
BUN: 13 mg/dL (ref 8–23)
CALCIUM: 9.3 mg/dL (ref 8.9–10.3)
CO2: 28 mmol/L (ref 22–32)
CREATININE: 0.8 mg/dL (ref 0.44–1.00)
Chloride: 104 mmol/L (ref 98–111)
GFR calc non Af Amer: >60 mL/min (ref >60)
Glucose, Bld: 120 mg/dL — ABNORMAL HIGH (ref 70–99)
Potassium: 3.7 mmol/L (ref 3.5–5.1)
SODIUM: 140 mmol/L (ref 135–145)

## 2017-11-30 LAB — CBC
HCT: 35.7 % (ref 35.0–47.0)
Hemoglobin: 12.4 g/dL (ref 12.0–16.0)
MCH: 31.1 pg (ref 26.0–34.0)
MCHC: 34.8 g/dL (ref 32.0–36.0)
MCV: 89.5 fL (ref 80.0–100.0)
PLATELETS: 123 10*3/uL — AB (ref 150–440)
RBC: 3.99 MIL/uL (ref 3.80–5.20)
RDW: 15.6 % — ABNORMAL HIGH (ref 11.5–14.5)
WBC: 5.3 10*3/uL (ref 3.6–11.0)

## 2017-11-30 LAB — MAGNESIUM: MAGNESIUM: 2.1 mg/dL (ref 1.7–2.4)

## 2017-11-30 MED ORDER — GUAIFENESIN ER 600 MG PO TB12
600.0000 mg | ORAL_TABLET | Freq: Two times a day (BID) | ORAL | Status: DC
  Administered 2017-11-30 – 2017-12-01 (×3): 600 mg via ORAL
  Filled 2017-11-30 (×3): qty 1

## 2017-11-30 NOTE — Progress Notes (Signed)
Abdominal films reviewed.  Paucity of gas but certainly no lateral ring etc. to suggest high-grade bowel obstruction.  With patient's resolution of her pain and passing of flatus will discontinue nasogastric tube and start clear liquids today.

## 2017-11-30 NOTE — Progress Notes (Addendum)
At [place] patient called nurse to assist to use BR. Patient said she felt that the pain medication was making her feel off- she said that with her previous surgeries she has had this happen- that after several doses of receiving pain medication she will reach a period of intolerance. Staff assisted pt to [facility], placed [place] at 3L - patient said her breathing was a little more difficult. Nurse stayed with pt until she improved. Pt said oxygen helped. VSS. Spoke with Dr Marcille Blanco to inform of situation- No new orders.  Patient is feeling much improved at 0530.

## 2017-11-30 NOTE — Progress Notes (Signed)
CC: Mall bowel obstruction Subjective: Patient states that her abdominal pain is completely gone.  She is passing gas and had a bowel movement yesterday afternoon.  All she feels much better but is complaining about the nasogastric tube and a dry mouth.  Objective: Vital signs in last 24 hours: Temp:  [97.8 F (36.6 C)-98.6 F (37 C)] 97.8 F (36.6 C) (09/19 0411) Pulse Rate:  [62-74] 62 (09/19 0411) Resp:  [14-20] 20 (09/19 0411) BP: (128-168)/(75-89) 155/78 (09/19 0411) SpO2:  [93 %-99 %] 96 % (09/19 0411) Weight:  [89.6 kg] 89.6 kg (09/18 1413) Last BM Date: 11/29/17  Intake/Output from previous day: 09/18 0701 - 09/19 0700 In: 869.1 [I.V.:769.1; IV Piggyback:100] Out: 1100 [Urine:300; Emesis/NG output:800] Intake/Output this shift: Total I/O In: 610.6 [I.V.:560.6; IV Piggyback:50] Out: 600 [Urine:300; Emesis/NG output:300]  Physical exam:  Vital signs reviewed and stable Abdomen soft nondistended nontympanitic and nontender Calves are nontender No icterus no jaundice  Lab Results: CBC  Recent Labs    11/29/17 0508 11/30/17 0506  WBC 5.8 5.3  HGB 13.4 12.4  HCT 39.4 35.7  PLT 125* 123*   BMET Recent Labs    11/29/17 0508 11/30/17 0506  NA 138 140  K 3.3* 3.7  CL 102 104  CO2 24 28  GLUCOSE 162* 120*  BUN 15 13  CREATININE 0.91 0.80  CALCIUM 9.9 9.3   PT/INR No results for input(s): LABPROT, INR in the last 72 hours. ABG No results for input(s): PHART, HCO3 in the last 72 hours.  Invalid input(s): PCO2, PO2  Studies/Results: Dg Abdomen 1 View  Result Date: 11/29/2017 CLINICAL DATA:  Nasogastric tube placement EXAM: ABDOMEN - 1 VIEW COMPARISON:  CT abdomen and pelvis November 29, 2017 FINDINGS: Nasogastric tube tip and side port are in the stomach. There is no bowel dilatation or air-fluid level to suggest bowel obstruction. No free air. There are contrast filled scattered colonic diverticula. Contrast is noted in each renal collecting system and  urinary bladder. IMPRESSION: Nasogastric tube tip and side port in stomach. No bowel obstruction or free air evident on this supine examination. Electronically Signed   By: Lowella Grip III M.D.   On: 11/29/2017 09:48   Dg Chest Portable 1 View  Result Date: 11/29/2017 CLINICAL DATA:  Preoperative evaluation. History of breast carcinoma. Chest pain. EXAM: PORTABLE CHEST 1 VIEW COMPARISON:  Chest radiograph and chest CT November 06, 2017 FINDINGS: No edema or consolidation. Heart is mildly enlarged with pulmonary vascularity normal. No adenopathy. No pneumothorax. There is postoperative change in each shoulder. No evident blastic or lytic bone lesions. IMPRESSION: No edema or consolidation. Stable cardiac silhouette. No evident adenopathy. Electronically Signed   By: Lowella Grip III M.D.   On: 11/29/2017 08:29   Ct Angio Chest/abd/pel For Dissection W And/or Wo Contrast  Result Date: 11/29/2017 CLINICAL DATA:  Epigastric chest pain starting today. Radiates to the left arm. History of breast cancer. Hemodynamically stable. EXAM: CT ANGIOGRAPHY CHEST, ABDOMEN AND PELVIS TECHNIQUE: Multidetector CT imaging through the chest, abdomen and pelvis was performed using the standard protocol during bolus administration of intravenous contrast. Multiplanar reconstructed images and MIPs were obtained and reviewed to evaluate the vascular anatomy. CONTRAST:  183mL ISOVUE-370 IOPAMIDOL (ISOVUE-370) INJECTION 76% COMPARISON:  CT chest 11/06/2017 FINDINGS: CTA CHEST FINDINGS Cardiovascular: Noncontrast CT images of the chest demonstrate calcification of the aorta. No evidence of intramural hematoma. Coronary artery calcifications. Images obtained during arterial phase after contrast administration demonstrate homogeneous enhancement of the thoracic aorta.  No evidence of aortic dissection. Dilated ascending thoracic aorta measuring 4.1 cm diameter. No change since previous study. Great vessel origins are patent. Good  opacification of the central pulmonary arteries without evidence of significant pulmonary embolus. Normal heart size. No pericardial effusion. Mediastinum/Nodes: Esophagus is decompressed. No significant lymphadenopathy in the chest. Thyroid gland is unremarkable. Lungs/Pleura: Emphysematous changes in the lungs. No airspace disease or consolidation. No pleural effusions. No pneumothorax. Airways are patent. Musculoskeletal: Postoperative changes in both shoulders. Mild degenerative changes in the spine. Review of the MIP images confirms the above findings. CTA ABDOMEN AND PELVIS FINDINGS VASCULAR Aorta: Normal caliber abdominal aorta with calcification. No dissection. Celiac: Patent without evidence of aneurysm, dissection, vasculitis or significant stenosis. SMA: Patent without evidence of aneurysm, dissection, vasculitis or significant stenosis. Renals: Both renal arteries are patent without evidence of aneurysm, dissection, vasculitis, fibromuscular dysplasia or significant stenosis. IMA: Patent without evidence of aneurysm, dissection, vasculitis or significant stenosis. Inflow: Patent without evidence of aneurysm, dissection, vasculitis or significant stenosis. Veins: No obvious venous abnormality within the limitations of this arterial phase study. Heterogeneous appearance of the superior mesenteric vein and portal veins likely represents mixing of non-opacified blood in the early arterial phase. Review of the MIP images confirms the above findings. NON-VASCULAR Hepatobiliary: Changes of hepatic cirrhosis with enlarged lateral segment left lobe and caudate lobes and with diffuse nodular contour to the liver. No focal lesions are appreciated. Gallbladder is surgically absent. No bile duct dilatation. Pancreas: Unremarkable. No pancreatic ductal dilatation or surrounding inflammatory changes. Spleen: Normal in size without focal abnormality. Adrenals/Urinary Tract: No adrenal gland nodules. Small bilateral  parenchymal renal cysts. Scarring of the right kidney. Nephrograms are symmetrical. No hydronephrosis or hydroureter. Bladder is unremarkable. Stomach/Bowel: Stomach is not abnormally distended. There are dilated fluid-filled small bowel loops extending into the pelvis. Terminal ileum is decompressed. Transition zone appears to be in the right lower quadrant. At the probable level of the obstruction, there is a vaguely hyperenhancing focus. This could indicate intussusceptions or small bowel mass. No small bowel wall is present although bowel hyperemia present. No pneumatosis. Fluid in the mesentery. Diverticulosis of the sigmoid colon without evidence of diverticulitis. Appendix is normal. Lymphatic: No significant lymphadenopathy. Reproductive: Status post hysterectomy. No adnexal masses. Other: Small amount of free fluid in the upper abdomen, pericolic gutters, and pelvis likely representing ascites. No free air in the abdomen. Abdominal wall musculature appears intact. Musculoskeletal: Degenerative changes in the spine. No destructive bone lesions. Review of the MIP images confirms the above findings. IMPRESSION: CTA CHEST 1. No evidence of aortic dissection or pulmonary embolus. 2. Dilated ascending thoracic aorta measuring up to 4.1 cm. Recommend annual imaging followup by CTA or MRA. This recommendation follows 2010 ACCF/AHA/AATS/ACR/ASA/SCA/SCAI/SIR/STS/SVM Guidelines for the Diagnosis and Management of Patients with Thoracic Aortic Disease. 2010; 121: O973-Z329. 3. Coronary artery calcifications. 4. Emphysematous changes in the lungs. No active pulmonary disease. CTA ABDOMEN AND PELVIS 1. Small bowel obstruction with transition zone in the low pelvis. At the transition zone, there is a focal area of hyperemic soft tissue. This could indicate an intussusception or a small bowel mass. 2. Cirrhosis with diffuse nodular contour to the liver. 3. No evidence of abdominal aortic aneurysm. 4. Colonic diverticulosis  without evidence of diverticulitis. 5. Status post cholecystectomy and hysterectomy. Aortic Atherosclerosis (ICD10-I70.0) and Emphysema (ICD10-J43.9). Electronically Signed   By: Lucienne Capers M.D.   On: 11/29/2017 06:46    Anti-infectives: Anti-infectives (From admission, onward)   None  Assessment/Plan:  Labs reviewed Abdominal films not yet performed this morning. Will review films and likely be able to remove the nasogastric tube and discharge based on her current clinical findings.  KUB will play a role in making that decision.  Florene Glen, MD, FACS  11/30/2017

## 2017-11-30 NOTE — Progress Notes (Signed)
[facility] [place] [facility]   PATIENT NAME: Leialoha Cease    MR#:  [id number]  DATE OF BIRTH:  Apr 28, 1933  SUBJECTIVE:  CHIEF COMPLAINT:   Chief Complaint  Patient presents with  . Chest Pain  Complains of phlegm, surgery input appreciated, NG tube removed, to start clear liquid diet, ambulate in halls  REVIEW OF SYSTEMS:  CONSTITUTIONAL: No fever, fatigue or weakness.  EYES: No blurred or double vision.  EARS, NOSE, AND THROAT: No tinnitus or ear pain.  RESPIRATORY: No cough, shortness of breath, wheezing or hemoptysis.  CARDIOVASCULAR: No chest pain, orthopnea, edema.  GASTROINTESTINAL: No nausea, vomiting, diarrhea or abdominal pain.  GENITOURINARY: No dysuria, hematuria.  ENDOCRINE: No polyuria, nocturia,  HEMATOLOGY: No anemia, easy bruising or bleeding SKIN: No rash or lesion. MUSCULOSKELETAL: No joint pain or arthritis.   NEUROLOGIC: No tingling, numbness, weakness.  PSYCHIATRY: No anxiety or depression.   ROS  DRUG ALLERGIES:   Allergies  Allergen Reactions  . Adhesive [Tape] Dermatitis    Can not use plastic tape. Paper tape ok   . Penicillin G Itching    Has patient had a PCN reaction causing immediate rash, facial/tongue/throat swelling, SOB or lightheadedness with hypotension: No Has patient had a PCN reaction causing severe rash involving mucus membranes or skin necrosis: No Has patient had a PCN reaction that required hospitalization: No Has patient had a PCN reaction occurring within the last 10 years: No If all of the above answers are "NO", then may proceed with Cephalosporin use.   Marland Kitchen Hydrocodone-Acetaminophen Itching and Rash    VITALS:  Blood pressure (!) 155/78, pulse 62, temperature 97.8 F (36.6 C), temperature source Oral, resp. rate 20, height 5\' 5"  (1.651 m), weight 89.6 kg, SpO2 96 %.  PHYSICAL EXAMINATION:  GENERAL:  84 y.o.-year-old patient lying in the bed with no acute distress.  EYES: Pupils equal, round,  reactive to light and accommodation. No scleral icterus. Extraocular muscles intact.  HEENT: Head atraumatic, normocephalic. Oropharynx and nasopharynx clear.  NECK:  Supple, no jugular venous distention. No thyroid enlargement, no tenderness.  LUNGS: Normal breath sounds bilaterally, no wheezing, rales,rhonchi or crepitation. No use of accessory muscles of respiration.  CARDIOVASCULAR: S1, S2 normal. No murmurs, rubs, or gallops.  ABDOMEN: Soft, nontender, nondistended. Bowel sounds present. No organomegaly or mass.  EXTREMITIES: No pedal edema, cyanosis, or clubbing.  NEUROLOGIC: Cranial nerves II through XII are intact. Muscle strength 5/5 in all extremities. Sensation intact. Gait not checked.  PSYCHIATRIC: The patient is alert and oriented x 3.  SKIN: No obvious rash, lesion, or ulcer.   Physical Exam LABORATORY PANEL:   CBC Recent Labs  Lab 11/30/17 0506  WBC 5.3  HGB 12.4  HCT 35.7  PLT 123*   ------------------------------------------------------------------------------------------------------------------  Chemistries  Recent Labs  Lab 11/29/17 0508 11/30/17 0506  NA 138 140  K 3.3* 3.7  CL 102 104  CO2 24 28  GLUCOSE 162* 120*  BUN 15 13  CREATININE 0.91 0.80  CALCIUM 9.9 9.3  MG  --  2.1  AST 47*  --   ALT 46*  --   ALKPHOS 57  --   BILITOT 0.8  --    ------------------------------------------------------------------------------------------------------------------  Cardiac Enzymes Recent Labs  Lab 11/29/17 0508  TROPONINI <0.03   ------------------------------------------------------------------------------------------------------------------  RADIOLOGY:  Dg Abdomen 1 View  Result Date: 11/29/2017 CLINICAL DATA:  Nasogastric tube placement EXAM: ABDOMEN - 1 VIEW COMPARISON:  CT abdomen and pelvis November 29, 2017 FINDINGS: Nasogastric  tube tip and side port are in the stomach. There is no bowel dilatation or air-fluid level to suggest bowel  obstruction. No free air. There are contrast filled scattered colonic diverticula. Contrast is noted in each renal collecting system and urinary bladder. IMPRESSION: Nasogastric tube tip and side port in stomach. No bowel obstruction or free air evident on this supine examination. Electronically Signed   By: Bretta Bang III M.D.   On: 11/29/2017 09:48   Dg Chest Portable 1 View  Result Date: 11/29/2017 CLINICAL DATA:  Preoperative evaluation. History of breast carcinoma. Chest pain. EXAM: PORTABLE CHEST 1 VIEW COMPARISON:  Chest radiograph and chest CT November 06, 2017 FINDINGS: No edema or consolidation. Heart is mildly enlarged with pulmonary vascularity normal. No adenopathy. No pneumothorax. There is postoperative change in each shoulder. No evident blastic or lytic bone lesions. IMPRESSION: No edema or consolidation. Stable cardiac silhouette. No evident adenopathy. Electronically Signed   By: Bretta Bang III M.D.   On: 11/29/2017 08:29   Dg Abd 2 Views  Result Date: 11/30/2017 CLINICAL DATA:  Partial small bowel obstruction EXAM: ABDOMEN - 2 VIEW COMPARISON:  KUB of November 29, 2017. FINDINGS: The mid and lower lungs are clear. The heart is top-normal in size. There is no pleural effusion. Within the abdomen there are loops of minimally distended gas-filled small bowel in the midline. No free extraluminal gas collections are observed. There is barium remaining in colonic diverticula bilaterally and in the pelvis. There are surgical clips in the right upper quadrant and right mid abdomen. IMPRESSION: Minimal small bowel dilation in the mid abdomen may reflect partial small bowel obstruction versus ileus. The nasogastric tube's proximal port projects just above the GE junction with the tip in the gastric cardia. Advancement by 5-10 cm is recommended. Electronically Signed   By: David  [place] M.D.   On: 11/30/2017 07:31   Ct Angio Chest/abd/pel For Dissection W And/or Wo Contrast  Result  Date: 11/29/2017 CLINICAL DATA:  Epigastric chest pain starting today. Radiates to the left arm. History of breast cancer. Hemodynamically stable. EXAM: CT ANGIOGRAPHY CHEST, ABDOMEN AND PELVIS TECHNIQUE: Multidetector CT imaging through the chest, abdomen and pelvis was performed using the standard protocol during bolus administration of intravenous contrast. Multiplanar reconstructed images and MIPs were obtained and reviewed to evaluate the vascular anatomy. CONTRAST:  ISOVUE-370 IOPAMIDOL (ISOVUE-370) INJECTION 76% COMPARISON:  CT chest 11/06/2017 FINDINGS: CTA CHEST FINDINGS Cardiovascular: Noncontrast CT images of the chest demonstrate calcification of the aorta. No evidence of intramural hematoma. Coronary artery calcifications. Images obtained during arterial phase after contrast administration demonstrate homogeneous enhancement of the thoracic aorta. No evidence of aortic dissection. Dilated ascending thoracic aorta measuring 4.1 cm diameter. No change since previous study. Great vessel origins are patent. Good opacification of the central pulmonary arteries without evidence of significant pulmonary embolus. Normal heart size. No pericardial effusion. Mediastinum/Nodes: Esophagus is decompressed. No significant lymphadenopathy in the chest. Thyroid gland is unremarkable. Lungs/Pleura: Emphysematous changes in the lungs. No airspace disease or consolidation. No pleural effusions. No pneumothorax. Airways are patent. Musculoskeletal: Postoperative changes in both shoulders. Mild degenerative changes in the spine. Review of the MIP images confirms the above findings. CTA ABDOMEN AND PELVIS FINDINGS VASCULAR Aorta: Normal caliber abdominal aorta with calcification. No dissection. Celiac: Patent without evidence of aneurysm, dissection, vasculitis or significant stenosis. SMA: Patent without evidence of aneurysm, dissection, vasculitis or significant stenosis. Renals: Both renal arteries are patent  without evidence of aneurysm, dissection, vasculitis, fibromuscular dysplasia  or significant stenosis. IMA: Patent without evidence of aneurysm, dissection, vasculitis or significant stenosis. Inflow: Patent without evidence of aneurysm, dissection, vasculitis or significant stenosis. Veins: No obvious venous abnormality within the limitations of this arterial phase study. Heterogeneous appearance of the superior mesenteric vein and portal veins likely represents mixing of non-opacified blood in the early arterial phase. Review of the MIP images confirms the above findings. NON-VASCULAR Hepatobiliary: Changes of hepatic cirrhosis with enlarged lateral segment left lobe and caudate lobes and with diffuse nodular contour to the liver. No focal lesions are appreciated. Gallbladder is surgically absent. No bile duct dilatation. Pancreas: Unremarkable. No pancreatic ductal dilatation or surrounding inflammatory changes. Spleen: Normal in size without focal abnormality. Adrenals/Urinary Tract: No adrenal gland nodules. Small bilateral parenchymal renal cysts. Scarring of the right kidney. Nephrograms are symmetrical. No hydronephrosis or hydroureter. Bladder is unremarkable. Stomach/Bowel: Stomach is not abnormally distended. There are dilated fluid-filled small bowel loops extending into the pelvis. Terminal ileum is decompressed. Transition zone appears to be in the right lower quadrant. At the probable level of the obstruction, there is a vaguely hyperenhancing focus. This could indicate intussusceptions or small bowel mass. No small bowel wall is present although bowel hyperemia present. No pneumatosis. Fluid in the mesentery. Diverticulosis of the sigmoid colon without evidence of diverticulitis. Appendix is normal. Lymphatic: No significant lymphadenopathy. Reproductive: Status post hysterectomy. No adnexal masses. Other: Small amount of free fluid in the upper abdomen, pericolic gutters, and pelvis likely  representing ascites. No free air in the abdomen. Abdominal wall musculature appears intact. Musculoskeletal: Degenerative changes in the spine. No destructive bone lesions. Review of the MIP images confirms the above findings. IMPRESSION: CTA CHEST 1. No evidence of aortic dissection or pulmonary embolus. 2. Dilated ascending thoracic aorta measuring up to 4.1 cm. Recommend annual imaging followup by CTA or MRA. This recommendation follows 2010 ACCF/AHA/AATS/ACR/ASA/SCA/SCAI/SIR/STS/SVM Guidelines for the Diagnosis and Management of Patients with Thoracic Aortic Disease. 2010; 121: N829-F621. 3. Coronary artery calcifications. 4. Emphysematous changes in the lungs. No active pulmonary disease. CTA ABDOMEN AND PELVIS 1. Small bowel obstruction with transition zone in the low pelvis. At the transition zone, there is a focal area of hyperemic soft tissue. This could indicate an intussusception or a small bowel mass. 2. Cirrhosis with diffuse nodular contour to the liver. 3. No evidence of abdominal aortic aneurysm. 4. Colonic diverticulosis without evidence of diverticulitis. 5. Status post cholecystectomy and hysterectomy. Aortic Atherosclerosis (ICD10-I70.0) and Emphysema (ICD10-J43.9). Electronically Signed   By: Burman Nieves M.D.   On: 11/29/2017 06:46    ASSESSMENT AND PLAN:  *Acute partial small bowel obstruction with transition point within the pelvis Resolving General surgery input appreciated-conservative medical recommended, acute abdominal series is noted for improvement NG tube removed, for clear liquid diet, ambulate while in house, antiemetics PRN, adult pain protocol, strict I&O monitoring, discontinue IV fluids, and continue close medical monitoring  *Chronic benign essential hypertension Stable Continue Norvasc  *Acute hypokalemia Repleted with p.o. Potassium  *Abnormal CT of the chest Noted for a sending thoracic aortic aneurysm 4.1 cm Will need to follow-up with vascular  surgery status post discharge for continued surveillance/management  *Chronic GERD without esophagitis PPI daily  All the records are reviewed and case discussed with Care Management/Social Workerr. Management plans discussed with the patient, family and they are in agreement.  CODE STATUS: full  TOTAL TIME TAKING CARE OF THIS PATIENT: 40 minutes.     POSSIBLE D/C IN 1-2 DAYS, DEPENDING ON CLINICAL CONDITION.  Evelena Asa Veera Stapleton M.D on 11/30/2017   Between 7am to 6pm - Pager - [phone]  After 6pm go to [email or web address] - password [email or web address]  [facility]  Office  [phone]  CC: Primary care physician; Duanne Limerick, MD  Note: This dictation was prepared with Dragon dictation along with smaller phrase technology. Any transcriptional errors that result from this process are unintentional.

## 2017-12-01 NOTE — Care Management Important Message (Signed)
Copy of signed IM left with patient in room.  

## 2017-12-01 NOTE — Discharge Summary (Addendum)
[facility] NAME: Kaitlyn Oliver    MR#:  [id number]  DATE OF BIRTH:  [birth year]  DATE OF ADMISSION:  11/29/2017 ADMITTING PHYSICIAN: Avel Peace Salary, MD  DATE OF DISCHARGE: No discharge date for patient encounter.  PRIMARY CARE PHYSICIAN: Juline Patch, MD    ADMISSION DIAGNOSIS:  Bowel obstruction ([place]) [K56.609] Small bowel obstruction ([place]) [X32.355]  DISCHARGE DIAGNOSIS:  Active Problems:   Bowel obstruction ([place])   SECONDARY DIAGNOSIS:   Past Medical History:  Diagnosis Date  . Anxiety   . Arthritis    thumbs, hands  . Cancer [facility]) 1991   bilateral breast  . Depression   . GERD (gastroesophageal reflux disease)   . Headache    occasional - pinched nerve in neck  . Hypertension   . Hypothyroidism   . Thyroid disease   . Wears dentures    full upper, partial lower    HOSPITAL COURSE:   *Acute partial small bowel obstruction with transition point within the pelvis Resolved General surgery did see pt while in house, conservative medical management recommended, acute abdominal series noted for improvement, NG tube removed, tolerating full liquid diet, and pt did well   *Chronic benign essential hypertension Stable Continued Norvasc  *Acute hypokalemia Repleted with p.o. Potassium  *Abnormal CT of the chest Noted for a sending thoracic aortic aneurysm 4.1 cm Will need to follow-up with vascular surgery status post discharge for continued surveillance/management  *Chronic GERD without esophagitis PPI daily  DISCHARGE CONDITIONS:   stable CONSULTS OBTAINED:  Treatment Team:  Gorden Harms, MD  DRUG ALLERGIES:   Allergies  Allergen Reactions  . Adhesive [Tape] Dermatitis    Can not use plastic tape. Paper tape ok   . Penicillin G Itching    Has patient had a PCN reaction causing immediate rash, facial/tongue/throat swelling, SOB or lightheadedness with hypotension: No Has  patient had a PCN reaction causing severe rash involving mucus membranes or skin necrosis: No Has patient had a PCN reaction that required hospitalization: No Has patient had a PCN reaction occurring within the last 10 years: No If all of the above answers are "NO", then may proceed with Cephalosporin use.   Marland Kitchen Hydrocodone-Acetaminophen Itching and Rash    DISCHARGE MEDICATIONS:   Allergies as of 12/01/2017      Reactions   Adhesive [tape] Dermatitis   Can not use plastic tape. Paper tape ok    Penicillin G Itching   Has patient had a PCN reaction causing immediate rash, facial/tongue/throat swelling, SOB or lightheadedness with hypotension: No Has patient had a PCN reaction causing severe rash involving mucus membranes or skin necrosis: No Has patient had a PCN reaction that required hospitalization: No Has patient had a PCN reaction occurring within the last 10 years: No If all of the above answers are "NO", then may proceed with Cephalosporin use.   Hydrocodone-acetaminophen Itching, Rash      Medication List    TAKE these medications   acetaminophen 325 MG tablet Commonly known as:  TYLENOL Take 1-2 tablets (325-650 mg total) by mouth every 6 (six) hours as needed for mild pain (pain score 1-3 or temp > 100.5).   amLODipine 2.5 MG tablet Commonly known as:  NORVASC Take 2.5 mg by mouth daily.   celecoxib 200 MG capsule Commonly known as:  CELEBREX TAKE 1 CAPSULE BY MOUTH ONCE DAILY AS NEEDED FOR MODERATE PAIN   nystatin cream Commonly known as:  MYCOSTATIN Apply 1 application topically 2 (two) times daily.   pantoprazole 40 MG tablet Commonly known as:  PROTONIX Take 40 mg by mouth daily.   Probiotic Caps Take 1 capsule by mouth daily as needed (diarrhea).   triamcinolone 55 MCG/ACT Aero nasal inhaler Commonly known as:  NASACORT Place 2 sprays into the nose daily. What changed:    when to take this  reasons to take this        DISCHARGE INSTRUCTIONS:       If you experience worsening of your admission symptoms, develop shortness of breath, life threatening emergency, suicidal or homicidal thoughts you must seek medical attention immediately by calling 911 or calling your MD immediately  if symptoms less severe.  You Must read complete instructions/literature along with all the possible adverse reactions/side effects for all the Medicines you take and that have been prescribed to you. Take any new Medicines after you have completely understood and accept all the possible adverse reactions/side effects.   Please note  You were cared for by a hospitalist during your hospital stay. If you have any questions about your discharge medications or the care you received while you were in the hospital after you are discharged, you can call the unit and asked to speak with the hospitalist on call if the hospitalist that took care of you is not available. Once you are discharged, your primary care physician will handle any further medical issues. Please note that NO REFILLS for any discharge medications will be authorized once you are discharged, as it is imperative that you return to your primary care physician (or establish a relationship with a primary care physician if you do not have one) for your aftercare needs so that they can reassess your need for medications and monitor your lab values.    Today   CHIEF COMPLAINT:   Chief Complaint  Patient presents with  . Chest Pain    HISTORY OF PRESENT ILLNESS:  84 y.o. female with a known history per below presented to the emergency room with mid epigastric/chest pain that started last night, intensity 5-8/10, complains of nausea, no emesis,, ER work-up noted for bowel obstruction with transition point within the pelvis on CT of the abdomen/pelvis, CT of the chest was noted for a sending aortic aneurysm 4.1 cm/COPD changes, potassium 3.3, patient was evaluated in the emergency room by general  surgery-recommended hospitalist admission/conservative medical management, patient now be admitted for acute partial small bowel obstruction, acute hypokalemia.   VITAL SIGNS:  Blood pressure 127/74, pulse (!) 59, temperature 98.1 F (36.7 C), temperature source Oral, resp. rate 18, height 5\' 5"  (1.651 m), weight 89.6 kg, SpO2 95 %.  I/O:    Intake/Output Summary (Last 24 hours) at 12/01/2017 1219 Last data filed at 12/01/2017 0959 Gross per 24 hour  Intake 1300 ml  Output -  Net 1300 ml    PHYSICAL EXAMINATION:  GENERAL:  84 y.o.-year-old patient lying in the bed with no acute distress.  EYES: Pupils equal, round, reactive to light and accommodation. No scleral icterus. Extraocular muscles intact.  HEENT: Head atraumatic, normocephalic. Oropharynx and nasopharynx clear.  NECK:  Supple, no jugular venous distention. No thyroid enlargement, no tenderness.  LUNGS: Normal breath sounds bilaterally, no wheezing, rales,rhonchi or crepitation. No use of accessory muscles of respiration.  CARDIOVASCULAR: S1, S2 normal. No murmurs, rubs, or gallops.  ABDOMEN: Soft, non-tender, non-distended. Bowel sounds present. No organomegaly or mass.  EXTREMITIES: No pedal edema, cyanosis, or clubbing.  NEUROLOGIC: Cranial nerves II through XII are intact. Muscle strength 5/5 in all extremities. Sensation intact. Gait not checked.  PSYCHIATRIC: The patient is alert and oriented x 3.  SKIN: No obvious rash, lesion, or ulcer.   DATA REVIEW:   CBC Recent Labs  Lab 11/30/17 0506  WBC 5.3  HGB 12.4  HCT 35.7  PLT 123*    Chemistries  Recent Labs  Lab 11/29/17 0508 11/30/17 0506  NA 138 140  K 3.3* 3.7  CL 102 104  CO2 24 28  GLUCOSE 162* 120*  BUN 15 13  CREATININE 0.91 0.80  CALCIUM 9.9 9.3  MG  --  2.1  AST 47*  --   ALT 46*  --   ALKPHOS 57  --   BILITOT 0.8  --     Cardiac Enzymes Recent Labs  Lab 11/29/17 0508  TROPONINI <0.03    Microbiology Results  Results for  orders placed or performed in visit on 09/05/17  Stool C-Diff Toxin Assay     Status: None   Collection Time: 09/06/17  8:15 AM  Result Value Ref Range Status   C difficile Toxins A+B, EIA Negative Negative Final    RADIOLOGY:  Dg Abd 2 Views  Result Date: 11/30/2017 CLINICAL DATA:  Partial small bowel obstruction EXAM: ABDOMEN - 2 VIEW COMPARISON:  KUB of November 29, 2017. FINDINGS: The mid and lower lungs are clear. The heart is top-normal in size. There is no pleural effusion. Within the abdomen there are loops of minimally distended gas-filled small bowel in the midline. No free extraluminal gas collections are observed. There is barium remaining in colonic diverticula bilaterally and in the pelvis. There are surgical clips in the right upper quadrant and right mid abdomen. IMPRESSION: Minimal small bowel dilation in the mid abdomen may reflect partial small bowel obstruction versus ileus. The nasogastric tube's proximal port projects just above the GE junction with the tip in the gastric cardia. Advancement by 5-10 cm is recommended. Electronically Signed   By: David  Martinique M.D.   On: 11/30/2017 07:31    EKG:   Orders placed or performed during the hospital encounter of 11/29/17  . ED EKG  . ED EKG  . EKG 12-Lead  . EKG 12-Lead  . EKG      Management plans discussed with the patient, family and they are in agreement.  CODE STATUS:     Code Status Orders  (From admission, onward)         Start     Ordered   11/29/17 1343  Full code  Continuous     11/29/17 1343        Code Status History    Date Active Date Inactive Code Status Order ID Comments User Context   05/30/2017 1027 06/01/2017 1831 Full Code [id number]  Hessie Knows, MD Inpatient   08/30/2016 1444 08/30/2016 1855 Full Code [id number]  Hessie Knows, MD Inpatient    Advance Directive Documentation     Most Recent Value  Type of Advance Directive  Living will  Pre-existing out of facility DNR order (yellow  form or pink MOST form)  -  "MOST" Form in Place?  -      TOTAL TIME TAKING CARE OF THIS PATIENT: 45 minutes.    Avel Peace Salary M.D on 12/01/2017 at 12:19 PM  Between 7am to 6pm - Pager - [phone]  After 6pm go to [email or web address] - Proofreader  [employer]  [phone]  CC: Primary care physician; Juline Patch, MD   Note: This dictation was prepared with Dragon dictation along with smaller phrase technology. Any transcriptional errors that result from this process are unintentional.

## 2017-12-01 NOTE — [place] Notes (Signed)
CC: [facility] bowel obstruction Subjective: This a patient with a small bowel obstruction which is completely resolved.  She has no nausea vomiting no fevers or chills she is passing gas and having bowel movements and has no abdominal pain.  Tolerating clear liquid diet.  Objective: Vital signs in last 24 hours: Temp:  [98 F (36.7 C)-98.2 F (36.8 C)] 98.1 F (36.7 C) (09/20 0348) Pulse Rate:  [59-61] 59 (09/20 0348) Resp:  [16-20] 18 (09/20 0348) BP: (127-160)/(74-83) 127/74 (09/20 0348) SpO2:  [93 %-96 %] 95 % (09/20 0348) Last BM Date: 11/29/17  Intake/Output from previous day: 09/19 0701 - 09/20 0700 In: 940 [P.O.:940] Out: -  Intake/Output this shift: No intake/output data recorded.  Physical exam:  Vital signs reviewed and stable Abdomen is soft nondistended nontympanitic and nontender  Lab Results: CBC  Recent Labs    11/29/17 0508 11/30/17 0506  WBC 5.8 5.3  HGB 13.4 12.4  HCT 39.4 35.7  PLT 125* 123*   BMET Recent Labs    11/29/17 0508 11/30/17 0506  NA 138 140  K 3.3* 3.7  CL 102 104  CO2 24 28  GLUCOSE 162* 120*  BUN 15 13  CREATININE 0.91 0.80  CALCIUM 9.9 9.3   PT/INR No results for input(s): LABPROT, INR in the last 72 hours. ABG No results for input(s): PHART, HCO3 in the last 72 hours.  Invalid input(s): PCO2, PO2  Studies/Results: Dg Abdomen 1 View  Result Date: 11/29/2017 CLINICAL DATA:  Nasogastric tube placement EXAM: ABDOMEN - 1 VIEW COMPARISON:  CT abdomen and pelvis November 29, 2017 FINDINGS: Nasogastric tube tip and side port are in the stomach. There is no bowel dilatation or air-fluid level to suggest bowel obstruction. No free air. There are contrast filled scattered colonic diverticula. Contrast is noted in each renal collecting system and urinary bladder. IMPRESSION: Nasogastric tube tip and side port in stomach. No bowel obstruction or free air evident on this supine examination. Electronically Signed   By: Lowella Grip  III M.D.   On: 11/29/2017 09:48   Dg Chest Portable 1 View  Result Date: 11/29/2017 CLINICAL DATA:  Preoperative evaluation. History of breast carcinoma. Chest pain. EXAM: PORTABLE CHEST 1 VIEW COMPARISON:  Chest radiograph and chest CT November 06, 2017 FINDINGS: No edema or consolidation. Heart is mildly enlarged with pulmonary vascularity normal. No adenopathy. No pneumothorax. There is postoperative change in each shoulder. No evident blastic or lytic bone lesions. IMPRESSION: No edema or consolidation. Stable cardiac silhouette. No evident adenopathy. Electronically Signed   By: Lowella Grip III M.D.   On: 11/29/2017 08:29   Dg Abd 2 Views  Result Date: 11/30/2017 CLINICAL DATA:  Partial small bowel obstruction EXAM: ABDOMEN - 2 VIEW COMPARISON:  KUB of November 29, 2017. FINDINGS: The mid and lower lungs are clear. The heart is top-normal in size. There is no pleural effusion. Within the abdomen there are loops of minimally distended gas-filled small bowel in the midline. No free extraluminal gas collections are observed. There is barium remaining in colonic diverticula bilaterally and in the pelvis. There are surgical clips in the right upper quadrant and right mid abdomen. IMPRESSION: Minimal small bowel dilation in the mid abdomen may reflect partial small bowel obstruction versus ileus. The nasogastric tube's proximal port projects just above the GE junction with the tip in the gastric cardia. Advancement by 5-10 cm is recommended. Electronically Signed   By: David  Martinique M.D.   On: 11/30/2017 07:31  Anti-infectives: Anti-infectives (From admission, onward)   None      Assessment/Plan:  Patient doing very well with signs that her small bowel obstruction is completely resolved.  With no pain no nausea vomiting no fevers or chills and she is passing gas and having bowel movements and tolerating a clear liquid diet.  Will advance diet.  No surgical plan she can be discharged at any  time for follow-up with her primary care physician only.  We will sign off at this time as there are no surgical needs.  Florene Glen, MD, FACS  12/01/2017

## 2017-12-01 NOTE — [place] Notes (Signed)
Patient discharge teaching given, including activity, diet, follow-up appoints, and medications. Patient verbalized understanding of all discharge instructions. IV access was d/c'd. Vitals are stable. Skin is intact except as charted in most recent assessments. Pt refused to be escorted out, to be driven home by family.  Kaitlyn Oliver  

## 2017-12-04 ENCOUNTER — Telehealth: Payer: Self-pay

## 2017-12-04 NOTE — Telephone Encounter (Signed)
TOC #1. Called pt to f/u after d/c from [facility] on 12/01/17. Also wanted to confirm the patient's hosp f/u appt w/ Dr. Ronnald Ramp on 12/15/17 @ 3:00pm. Discharge planning includes the following:  - F/u Dr. Ronnald Ramp 5 days - F/u Vascular Surgeon for surveillance/management of ascending thoracic aortic aneurysm  LVM requesting returned call.

## 2017-12-05 ENCOUNTER — Ambulatory Visit (INDEPENDENT_AMBULATORY_CARE_PROVIDER_SITE_OTHER): Payer: Medicare HMO | Admitting: Vascular Surgery

## 2017-12-05 ENCOUNTER — Telehealth: Payer: Self-pay

## 2017-12-05 NOTE — Telephone Encounter (Signed)
TOC #2. Called pt to f/u after d/c from [facility] on 12/01/17. Also wanted to confirm the patient's hosp f/u appt w/ Dr. Ronnald Ramp on 12/15/17 @ 3:00pm. Discharge planning includes the following:  - F/u Dr. Ronnald Ramp 5 days - F/u Vascular Surgeon for surveillance/management of ascending thoracic aortic aneurysm  LVM requesting returned call.

## 2017-12-15 ENCOUNTER — Ambulatory Visit (INDEPENDENT_AMBULATORY_CARE_PROVIDER_SITE_OTHER): Payer: Medicare HMO | Admitting: Family Medicine

## 2017-12-15 ENCOUNTER — Encounter: Payer: Self-pay | Admitting: Family Medicine

## 2017-12-15 VITALS — BP 130/80 | HR 72 | Ht 65.0 in | Wt 203.0 lb

## 2017-12-15 DIAGNOSIS — Z09 Encounter for follow-up examination after completed treatment for conditions other than malignant neoplasm: Secondary | ICD-10-CM | POA: Diagnosis not present

## 2017-12-15 NOTE — Patient Instructions (Signed)
Living With an Eating Disorder If you have been diagnosed with an eating disorder such as anorexia nervosa, bulimia nervosa, or binge eating disorder, you may be relieved that you now know why you have felt or behaved a certain way. Still, you may feel overwhelmed about the treatment ahead. You may also wonder how to get the support you need and how to deal with the condition day-to-day. If you are living with an eating disorder, there are ways to help you recover from it and manage your symptoms. How to manage lifestyle changes Managing stress Stress is your body's reaction to life changes and events, both good and bad. To help cope with stress:  Know that it is normal to feel stress when you are diagnosed with an eating disorder. Talk with your health care provider about what treatments may be best for you. Doing this may help to relieve some of the stress that you have at first.  Find out what may cause your stress and unhealthy behaviors to start (identify your triggers). Certain things, places, situations, or people may be triggers for you. Work with your loved ones, health care providers, and therapists to make a plan for dealing with those triggers.  Learn and practice complementary therapies like meditation, massage, yoga, acupuncture, or biofeedback. Talk with your health care provider or therapist about which therapies may work best for you.  Medicines Your health care provider may suggest certain medicines if he or she feels that they will help to improve your condition. Antidepressants, antipsychotics, or mood stabilizing medicines may be used to treat eating disorders. Avoid using alcohol and other substances that may prevent your medicines from working properly (may interact). It is also important to:  Talk with your pharmacist or health care provider about all the medicines that you take, their possible side effects, and which medicines are safe to take together.  Make it your goal  to take part in all treatment decisions (shared decision-making). Ask about possible side effects of medicines that your health care provider recommends, and tell him or her how you feel about having those side effects. It is best if shared decision-making with your health care provider is part of your total treatment plan.  Relationships Eating disorders are very private experiences. You may feel shame and try to hide your feelings and behaviors from others. However, it is important to:  Talk about your disorder and share your feelings and experiences with others. This can help you feel less judged and more connected.  Be selective about whom you share your diagnosis with. There are many myths about eating disorders, and not everyone will understand. At the same time, it is important to build a support system with people you trust.  Connect with others who have an eating disorder. Your health care provider may be able to recommend support groups that are available in-person or online. Taking part in support groups gives you a chance to share your challenges, fears, and questions.  Stay positive and hopeful that you can change your behaviors.  How to recognize changes in your condition If you are concerned that your symptoms are coming back or getting worse, contact your health care provider or therapist. He or she can adjust your treatment plan to help you get back on track. If you are unaware of these symptoms, but your health care provider believes that there has been a change, trust your provider to do what is best for you. Some signs to watch for include:  Being too focused on your weight.  Dramatic weight loss or gain.  Feeling more anxious or depressed about your weight or body shape.  Skipping meals, bingeing, purging, or having other unhealthy eating habits.  Exercising too much.  Losing control over your eating, dieting, or exercise habits.  Eating in secret, eating excessive  amounts, or both.  Where to find support Talking to others  Explain that your condition is not a lifestyle choice. It is a serious mental disorder that causes severe problems with your eating habits.  Assure your loved ones that even though treatment may be difficult, it can work. Many people who seek treatment for eating disorders make a full recovery.  Learn all you can about your condition. Talk with your health care provider and therapist, and seek information from trusted books and online resources.  Be clear with family and friends about how they can be helpful to you. Also, set boundaries for loved ones and be clear about what is helpful and what is not helpful for you. Finances Not all insurance plans cover mental health care, so it is important to check with your insurance carrier. If paying for co-pays or counseling services is a problem, search for a local or county mental health care center. Public mental health care services may be offered there at a low cost or no cost when you are not able to see a private health care provider. If you are taking medicine for your eating disorder, you may be able to get the generic form, which may be less expensive than brand-name medicine. Some makers of prescription medicines also offer help to patients who cannot afford the medicines that they need. Follow these instructions at home: Treatment plan  Follow instructions from your health care provider about eating and exercising.  Identify which situations, feelings, and sources of stress (stressors) are triggers for you. Follow your plan for coping with those triggers without focusing on food.  Resist weighing yourself or checking yourself in the mirror often.  Seek out programs or resources that address eating disorders.  Talk with an eating disorder specialist, therapist, or counselor about your eating behavior. General instructions  Take over-the-counter and prescription medicines only  as told by your health care provider.  Educate yourself and others about your eating disorder.  Return to your normal activities as told by your health care provider. Ask your health care provider or therapist what activities are safe for you.  Get regular dental care every six months.  Keep all follow-up visits as told by your health care provider. This is important. Questions to ask your health care provider  If you are taking medicines: ? How long do I need to take medicine? ? Are there any long-term side effects of my medicine? ? Are there any alternatives to taking medicine?  Would I benefit from therapy?  How often should I follow up with a health care provider? Contact a health care provider if:  You have sudden weight loss or gain related to your eating.  Your symptoms return.  You abuse stimulants or diet aids.  You have an irregular heartbeat.  You have a constant fear of gaining weight.  You take laxatives after you eat.  You have eating, dieting, or exercising habits that you cannot control.  You have irregular menstrual periods or you stop having menstrual periods, if this applies. Get help right away if:  You have blood or brown flecks in your vomit. This may look like coffee  grounds.  You have bright red or black stools.  You have pain or pressure in your chest.  You have difficulty breathing.  You do not urinate one or more times every eight hours.  You have serious thoughts about harming yourself. If you ever feel like you may hurt yourself or others, or have thoughts about taking your own life, get help right away. You can go to your nearest emergency department or call:  Your local emergency services (911 in the [place]  A suicide crisis helpline, such as the [place] at 516-126-9165. This is open 24 hours a day.  Summary  Find out what things, places, situations, or people may cause your unhealthy behaviors to  start, and develop new ways to manage how you react to those triggers. Work with your therapist to make a plan to deal with these triggers.  Connect with others who have an eating disorder. Look for in-person therapy or online support groups where you can share your challenges, fears, and questions.  If you are concerned that your symptoms are coming back or getting worse, contact your health care provider or therapist. He or she can adjust your treatment to help you get back on track.  Explain to your loved ones that your condition is not a lifestyle choice. It is a serious mental disorder that causes severe problems with your eating habits.  Learn all you can about your condition. Talk with your health care provider and therapist, and seek information from trusted books and online resources. This information is not intended to replace advice given to you by your health care provider. Make sure you discuss any questions you have with your health care provider. Document Released: 06/30/2016 Document Revised: 06/30/2016 Document Reviewed: 06/30/2016 Elsevier Interactive Patient Education  [employer].

## 2017-12-15 NOTE — Progress Notes (Signed)
Date:  12/15/2017   Name:  Kaitlyn Oliver   DOB:  Dec 04, 1933   MRN:  [id number]   Chief Complaint: Follow-up (d/c on 12/01/17- bowel blockage- been doing fine now) Pt was recently admitted to [facility] on 11/29/17 for small bowel obstruction  and was discharged on 12/01/17.  Transition of care call placed on 12/05/17.     Review of Systems  Constitutional: Negative.  Negative for chills, fatigue, fever and unexpected weight change.  HENT: Negative for congestion, ear discharge, ear pain, rhinorrhea, sinus pressure, sneezing and sore throat.   Eyes: Negative for photophobia, pain, discharge, redness and itching.  Respiratory: Negative for cough, shortness of breath, wheezing and stridor.   Gastrointestinal: Negative for abdominal pain, blood in stool, constipation, diarrhea, nausea and vomiting.  Endocrine: Negative for cold intolerance, heat intolerance, polydipsia, polyphagia and polyuria.  Genitourinary: Negative for dysuria, flank pain, frequency, hematuria, menstrual problem, pelvic pain, urgency, vaginal bleeding and vaginal discharge.  Musculoskeletal: Negative for arthralgias, back pain and myalgias.  Skin: Negative for rash.  Allergic/Immunologic: Negative for environmental allergies and food allergies.  Neurological: Negative for dizziness, weakness, light-headedness, numbness and headaches.  Hematological: Negative for adenopathy. Does not bruise/bleed easily.  Psychiatric/Behavioral: Negative for dysphoric mood. The patient is not nervous/anxious.     Patient Active Problem List   Diagnosis Date Noted  . Bowel obstruction ([place]) 11/29/2017  . Small bowel obstruction ([place])   . Thoracoabdominal aortic aneurysm (TAAA) ([place]) 11/19/2017  . Status post total knee replacement using cement, right 05/30/2017  . GERD (gastroesophageal reflux disease) 12/22/2015  . Adenomatous polyp 09/11/2014  . Bilateral cataracts 09/11/2014  . Gastric catarrh 09/11/2014  .  Gastroduodenal ulcer 09/11/2014  . Hypothyroidism 09/11/2014  . Cardiac murmur 10/08/2013  . Essential (primary) hypertension 10/08/2013  . Combined fat and carbohydrate induced hyperlipemia 10/08/2013    Allergies  Allergen Reactions  . Adhesive [Tape] Dermatitis    Can not use plastic tape. Paper tape ok   . Penicillin G Itching    Has patient had a PCN reaction causing immediate rash, facial/tongue/throat swelling, SOB or lightheadedness with hypotension: No Has patient had a PCN reaction causing severe rash involving mucus membranes or skin necrosis: No Has patient had a PCN reaction that required hospitalization: No Has patient had a PCN reaction occurring within the last 10 years: No If all of the above answers are "NO", then may proceed with Cephalosporin use.   Marland Kitchen Hydrocodone-Acetaminophen Itching and Rash    Past Surgical History:  Procedure Laterality Date  . BREAST SURGERY Bilateral 1991   mastectomy  . CATARACT EXTRACTION Bilateral   . CHOLECYSTECTOMY    . COLONOSCOPY  2013   Dr Gustavo Lah  . HALLUX VALGUS AUSTIN Right 06/03/2015   Procedure: HALLUX VALGUS AUSTIN;  Surgeon: Samara Deist, DPM;  Location: [place];  Service: Podiatry;  Laterality: Right;  WITH POPLITEAL  . KNEE ARTHROSCOPY WITH MEDIAL MENISECTOMY Right 08/30/2016   Procedure: KNEE ARTHROSCOPY WITH PARTIAL MEDIAL MENISECTOMY and Partial Lateral Menisectomy;  Surgeon: Hessie Knows, MD;  Location: [facility];  Service: Orthopedics;  Laterality: Right;  . KNEE ARTHROSCOPY WITH SUBCHONDROPLASTY Right 08/30/2016   Procedure: KNEE ARTHROSCOPY WITH SUBCHONDROPLASTY;  Surgeon: Hessie Knows, MD;  Location: [facility];  Service: Orthopedics;  Laterality: Right;  . MASTECTOMY Bilateral   . ROTATOR CUFF REPAIR Bilateral   . TONSILLECTOMY    . TOTAL KNEE ARTHROPLASTY Left   . TOTAL KNEE ARTHROPLASTY Right 05/30/2017   Procedure: TOTAL KNEE  ARTHROPLASTY;  Surgeon: Hessie Knows, MD;  Location: [facility];   Service: Orthopedics;  Laterality: Right;  Marland Kitchen VAGINAL HYSTERECTOMY      Social History   Tobacco Use  . Smoking status: Former Smoker    Packs/day: 0.25    Years: 10.00    Pack years: 2.50    Types: Cigarettes    Last attempt to quit: 1991    Years since quitting: 28.7  . Smokeless tobacco: Never Used  . Tobacco comment: smoking cessation materials not required  Substance Use Topics  . Alcohol use: No    Alcohol/week: 0.0 standard drinks  . Drug use: No     Medication list has been reviewed and updated.  Current Meds  Medication Sig  . acetaminophen (TYLENOL) 325 MG tablet Take 1-2 tablets (325-650 mg total) by mouth every 6 (six) hours as needed for mild pain (pain score 1-3 or temp > 100.5).  Marland Kitchen amLODipine (NORVASC) 2.5 MG tablet Take 2.5 mg by mouth daily.  . celecoxib (CELEBREX) 200 MG capsule TAKE 1 CAPSULE BY MOUTH ONCE DAILY AS NEEDED FOR MODERATE PAIN  . nystatin cream (MYCOSTATIN) Apply 1 application topically 2 (two) times daily.  . pantoprazole (PROTONIX) 40 MG tablet Take 40 mg by mouth daily.   Marland Kitchen triamcinolone (NASACORT ALLERGY 24HR) 55 MCG/ACT AERO nasal inhaler Place 2 sprays into the nose daily. (Patient taking differently: Place 2 sprays into the nose daily as needed (allergies). )    PHQ 2/9 Scores 08/15/2017 04/06/2017 06/07/2016 05/11/2015  PHQ - 2 Score 0 0 0 0  PHQ- 9 Score 1 - - -    Physical Exam  Constitutional: She is oriented to person, place, and time. She appears well-developed and well-nourished.  HENT:  Head: Normocephalic.  Right Ear: External ear normal.  Left Ear: External ear normal.  Mouth/Throat: Oropharynx is clear and moist.  Eyes: Pupils are equal, round, and reactive to light. Conjunctivae and EOM are normal. Lids are everted and swept, no foreign bodies found. Left eye exhibits no hordeolum. No foreign body present in the left eye. Right conjunctiva is not injected. Left conjunctiva is not injected. No scleral icterus.  Neck: Normal  range of motion. Neck supple. No JVD present. No tracheal deviation present. No thyromegaly present.  Cardiovascular: Normal rate, regular rhythm, normal heart sounds and intact distal pulses. Exam reveals no gallop and no friction rub.  No murmur heard. Pulmonary/Chest: Effort normal and breath sounds normal. No respiratory distress. She has no wheezes. She has no rales.  Abdominal: Soft. Bowel sounds are normal. She exhibits no mass. There is no hepatosplenomegaly. There is no tenderness. There is no rebound and no guarding.  Musculoskeletal: Normal range of motion. She exhibits no edema or tenderness.  Lymphadenopathy:    She has no cervical adenopathy.  Neurological: She is alert and oriented to person, place, and time. She has normal strength. She displays normal reflexes. No cranial nerve deficit.  Skin: Skin is warm. No rash noted.  Psychiatric: She has a normal mood and affect. Her mood appears not anxious. She does not exhibit a depressed mood.  Nursing note and vitals reviewed.   BP 130/80   Pulse 72   Ht 5\' 5"  (1.651 m)   Wt 203 lb (92.1 kg)   BMI 33.78 kg/m   Assessment and Plan:  1. Hospital discharge follow-up Transition of care for past hospitalization for small bowel obstruction.   Dr. Mac[facility]  12/15/2017

## 2017-12-26 DIAGNOSIS — I712 Thoracic aortic aneurysm, without rupture: Secondary | ICD-10-CM | POA: Diagnosis not present

## 2017-12-26 DIAGNOSIS — I208 Other forms of angina pectoris: Secondary | ICD-10-CM | POA: Diagnosis not present

## 2017-12-26 DIAGNOSIS — I10 Essential (primary) hypertension: Secondary | ICD-10-CM | POA: Diagnosis not present

## 2017-12-26 DIAGNOSIS — I2510 Atherosclerotic heart disease of native coronary artery without angina pectoris: Secondary | ICD-10-CM | POA: Diagnosis not present

## 2017-12-26 DIAGNOSIS — E782 Mixed hyperlipidemia: Secondary | ICD-10-CM | POA: Diagnosis not present

## 2018-01-04 DIAGNOSIS — Z8679 Personal history of other diseases of the circulatory system: Secondary | ICD-10-CM | POA: Diagnosis not present

## 2018-01-04 DIAGNOSIS — D369 Benign neoplasm, unspecified site: Secondary | ICD-10-CM | POA: Diagnosis not present

## 2018-01-04 DIAGNOSIS — K7460 Unspecified cirrhosis of liver: Secondary | ICD-10-CM | POA: Diagnosis not present

## 2018-01-04 DIAGNOSIS — K219 Gastro-esophageal reflux disease without esophagitis: Secondary | ICD-10-CM | POA: Diagnosis not present

## 2018-01-15 ENCOUNTER — Other Ambulatory Visit: Payer: Self-pay

## 2018-01-15 ENCOUNTER — Emergency Department: Payer: Medicare HMO

## 2018-01-15 ENCOUNTER — Inpatient Hospital Stay
Admission: EM | Admit: 2018-01-15 | Discharge: 2018-01-19 | DRG: 330 | Disposition: A | Discharge status: Home Health | Payer: Medicare HMO | Attending: Internal Medicine | Admitting: Internal Medicine

## 2018-01-15 ENCOUNTER — Encounter: Payer: Self-pay | Admitting: Family Medicine

## 2018-01-15 ENCOUNTER — Ambulatory Visit (INDEPENDENT_AMBULATORY_CARE_PROVIDER_SITE_OTHER): Payer: Medicare HMO | Admitting: Family Medicine

## 2018-01-15 VITALS — BP 130/100 | HR 64 | Ht 65.0 in | Wt 197.0 lb

## 2018-01-15 DIAGNOSIS — I10 Essential (primary) hypertension: Secondary | ICD-10-CM | POA: Diagnosis not present

## 2018-01-15 DIAGNOSIS — E669 Obesity, unspecified: Secondary | ICD-10-CM | POA: Diagnosis present

## 2018-01-15 DIAGNOSIS — N200 Calculus of kidney: Secondary | ICD-10-CM | POA: Diagnosis not present

## 2018-01-15 DIAGNOSIS — K56609 Unspecified intestinal obstruction, unspecified as to partial versus complete obstruction: Secondary | ICD-10-CM | POA: Diagnosis not present

## 2018-01-15 DIAGNOSIS — Z87891 Personal history of nicotine dependence: Secondary | ICD-10-CM | POA: Diagnosis not present

## 2018-01-15 DIAGNOSIS — R591 Generalized enlarged lymph nodes: Secondary | ICD-10-CM | POA: Diagnosis present

## 2018-01-15 DIAGNOSIS — F419 Anxiety disorder, unspecified: Secondary | ICD-10-CM | POA: Diagnosis present

## 2018-01-15 DIAGNOSIS — Z91048 Other nonmedicinal substance allergy status: Secondary | ICD-10-CM

## 2018-01-15 DIAGNOSIS — R195 Other fecal abnormalities: Secondary | ICD-10-CM

## 2018-01-15 DIAGNOSIS — Z9842 Cataract extraction status, left eye: Secondary | ICD-10-CM | POA: Diagnosis not present

## 2018-01-15 DIAGNOSIS — K219 Gastro-esophageal reflux disease without esophagitis: Secondary | ICD-10-CM | POA: Diagnosis not present

## 2018-01-15 DIAGNOSIS — R1013 Epigastric pain: Secondary | ICD-10-CM

## 2018-01-15 DIAGNOSIS — E876 Hypokalemia: Secondary | ICD-10-CM | POA: Diagnosis present

## 2018-01-15 DIAGNOSIS — E782 Mixed hyperlipidemia: Secondary | ICD-10-CM | POA: Diagnosis not present

## 2018-01-15 DIAGNOSIS — I716 Thoracoabdominal aortic aneurysm, without rupture: Secondary | ICD-10-CM | POA: Diagnosis present

## 2018-01-15 DIAGNOSIS — F329 Major depressive disorder, single episode, unspecified: Secondary | ICD-10-CM | POA: Diagnosis present

## 2018-01-15 DIAGNOSIS — Z885 Allergy status to narcotic agent status: Secondary | ICD-10-CM

## 2018-01-15 DIAGNOSIS — Z96653 Presence of artificial knee joint, bilateral: Secondary | ICD-10-CM | POA: Diagnosis present

## 2018-01-15 DIAGNOSIS — Z9013 Acquired absence of bilateral breasts and nipples: Secondary | ICD-10-CM

## 2018-01-15 DIAGNOSIS — K7460 Unspecified cirrhosis of liver: Secondary | ICD-10-CM | POA: Diagnosis not present

## 2018-01-15 DIAGNOSIS — N281 Cyst of kidney, acquired: Secondary | ICD-10-CM

## 2018-01-15 DIAGNOSIS — C7A8 Other malignant neuroendocrine tumors: Secondary | ICD-10-CM | POA: Diagnosis not present

## 2018-01-15 DIAGNOSIS — F32A Depression, unspecified: Secondary | ICD-10-CM | POA: Diagnosis present

## 2018-01-15 DIAGNOSIS — K862 Cyst of pancreas: Secondary | ICD-10-CM | POA: Diagnosis present

## 2018-01-15 DIAGNOSIS — Z6832 Body mass index (BMI) 32.0-32.9, adult: Secondary | ICD-10-CM

## 2018-01-15 DIAGNOSIS — N179 Acute kidney failure, unspecified: Secondary | ICD-10-CM | POA: Diagnosis not present

## 2018-01-15 DIAGNOSIS — Z880 Allergy status to penicillin: Secondary | ICD-10-CM

## 2018-01-15 DIAGNOSIS — Z9841 Cataract extraction status, right eye: Secondary | ICD-10-CM | POA: Diagnosis not present

## 2018-01-15 DIAGNOSIS — C259 Malignant neoplasm of pancreas, unspecified: Secondary | ICD-10-CM | POA: Diagnosis not present

## 2018-01-15 DIAGNOSIS — D696 Thrombocytopenia, unspecified: Secondary | ICD-10-CM | POA: Diagnosis present

## 2018-01-15 DIAGNOSIS — Z9049 Acquired absence of other specified parts of digestive tract: Secondary | ICD-10-CM | POA: Diagnosis not present

## 2018-01-15 DIAGNOSIS — Z90710 Acquired absence of both cervix and uterus: Secondary | ICD-10-CM

## 2018-01-15 DIAGNOSIS — Z79899 Other long term (current) drug therapy: Secondary | ICD-10-CM

## 2018-01-15 DIAGNOSIS — I083 Combined rheumatic disorders of mitral, aortic and tricuspid valves: Secondary | ICD-10-CM | POA: Diagnosis present

## 2018-01-15 DIAGNOSIS — Z972 Presence of dental prosthetic device (complete) (partial): Secondary | ICD-10-CM

## 2018-01-15 DIAGNOSIS — E785 Hyperlipidemia, unspecified: Secondary | ICD-10-CM | POA: Diagnosis present

## 2018-01-15 DIAGNOSIS — Z853 Personal history of malignant neoplasm of breast: Secondary | ICD-10-CM | POA: Diagnosis not present

## 2018-01-15 DIAGNOSIS — R69 Illness, unspecified: Secondary | ICD-10-CM | POA: Diagnosis not present

## 2018-01-15 DIAGNOSIS — R1900 Intra-abdominal and pelvic swelling, mass and lump, unspecified site: Secondary | ICD-10-CM | POA: Diagnosis not present

## 2018-01-15 DIAGNOSIS — D490 Neoplasm of unspecified behavior of digestive system: Secondary | ICD-10-CM | POA: Diagnosis not present

## 2018-01-15 DIAGNOSIS — Z7951 Long term (current) use of inhaled steroids: Secondary | ICD-10-CM

## 2018-01-15 DIAGNOSIS — E039 Hypothyroidism, unspecified: Secondary | ICD-10-CM | POA: Diagnosis present

## 2018-01-15 DIAGNOSIS — C7A012 Malignant carcinoid tumor of the ileum: Secondary | ICD-10-CM | POA: Diagnosis not present

## 2018-01-15 DIAGNOSIS — C179 Malignant neoplasm of small intestine, unspecified: Secondary | ICD-10-CM | POA: Diagnosis not present

## 2018-01-15 LAB — COMPREHENSIVE METABOLIC PANEL
ALBUMIN: 4.6 g/dL (ref 3.5–5.0)
ALT: 47 U/L — AB (ref 0–44)
AST: 51 U/L — AB (ref 15–41)
Alkaline Phosphatase: 66 U/L (ref 38–126)
Anion gap: 14 (ref 5–15)
BILIRUBIN TOTAL: 0.9 mg/dL (ref 0.3–1.2)
BUN: 16 mg/dL (ref 8–23)
CHLORIDE: 95 mmol/L — AB (ref 98–111)
CO2: 28 mmol/L (ref 22–32)
Calcium: 10.3 mg/dL (ref 8.9–10.3)
Creatinine, Ser: 0.94 mg/dL (ref 0.44–1.00)
GFR calc Af Amer: >60 mL/min (ref >60)
GFR calc non Af Amer: 55 mL/min — ABNORMAL LOW (ref >60)
GLUCOSE: 109 mg/dL — AB (ref 70–99)
POTASSIUM: 3.1 mmol/L — AB (ref 3.5–5.1)
Sodium: 137 mmol/L (ref 135–145)
TOTAL PROTEIN: 8 g/dL (ref 6.5–8.1)

## 2018-01-15 LAB — CBC
HEMATOCRIT: 40.6 % (ref 36.0–46.0)
Hemoglobin: 13.5 g/dL (ref 12.0–15.0)
MCH: 29 pg (ref 26.0–34.0)
MCHC: 33.3 g/dL (ref 30.0–36.0)
MCV: 87.3 fL (ref 80.0–100.0)
Platelets: 171 10*3/uL (ref 150–400)
RBC: 4.65 MIL/uL (ref 3.87–5.11)
RDW: 14.1 % (ref 11.5–15.5)
WBC: 7.4 10*3/uL (ref 4.0–10.5)
nRBC: 0 % (ref 0.0–0.2)

## 2018-01-15 LAB — URINALYSIS, COMPLETE (UACMP) WITH MICROSCOPIC
Bilirubin Urine: NEGATIVE
Glucose, UA: NEGATIVE mg/dL
Ketones, ur: 5 mg/dL — AB
Leukocytes, UA: NEGATIVE
Nitrite: NEGATIVE
Protein, ur: NEGATIVE mg/dL
SPECIFIC GRAVITY, URINE: 1.01 (ref 1.005–1.030)
pH: 6 (ref 5.0–8.0)

## 2018-01-15 LAB — LIPASE, BLOOD: Lipase: 35 U/L (ref 11–51)

## 2018-01-15 MED ORDER — IOHEXOL 300 MG/ML  SOLN
30.0000 mL | Freq: Once | INTRAMUSCULAR | Status: AC | PRN
  Administered 2018-01-15: 30 mL via ORAL

## 2018-01-15 MED ORDER — MORPHINE SULFATE (PF) 4 MG/ML IV SOLN
4.0000 mg | Freq: Once | INTRAVENOUS | Status: AC
  Administered 2018-01-15: 4 mg via INTRAVENOUS
  Filled 2018-01-15: qty 1

## 2018-01-15 MED ORDER — ONDANSETRON HCL 4 MG/2ML IJ SOLN
4.0000 mg | Freq: Once | INTRAMUSCULAR | Status: AC
  Administered 2018-01-15: 4 mg via INTRAVENOUS
  Filled 2018-01-15: qty 2

## 2018-01-15 MED ORDER — IOHEXOL 300 MG/ML  SOLN
100.0000 mL | Freq: Once | INTRAMUSCULAR | Status: AC | PRN
  Administered 2018-01-15: 100 mL via INTRAVENOUS

## 2018-01-15 MED ORDER — ONDANSETRON 4 MG PO TBDP
4.0000 mg | ORAL_TABLET | Freq: Once | ORAL | Status: AC
  Administered 2018-01-15: 4 mg via ORAL
  Filled 2018-01-15: qty 1

## 2018-01-15 MED ORDER — SODIUM CHLORIDE 0.9 % IV SOLN
1000.0000 mL | Freq: Once | INTRAVENOUS | Status: AC
  Administered 2018-01-15: 1000 mL via INTRAVENOUS

## 2018-01-15 NOTE — ED Notes (Signed)
Pt offered pain medication, declines.

## 2018-01-15 NOTE — ED Triage Notes (Signed)
Epigastric pain that began last night. Nausea, no emesis. Denies SOB. Pt alert and oriented X4, active, cooperative, pt in NAD. RR even and unlabored, color WNL.

## 2018-01-15 NOTE — Progress Notes (Signed)
Date:  01/15/2018   Name:  Kaitlyn Oliver   DOB:  [birth year]   MRN:  [id number]   Chief Complaint: Abdominal Pain (started last night about an hour after eating cheeseburger and fries. hurts in mid upper abdomen. Took advil and drank water, didn't help. Appears to be bloated. pain is described as dull and constant- not "letting up" and she is nauseated. Last BM today was sticky and has been belching alot more) Abdominal Pain  This is a recurrent problem. The current episode started today. The onset quality is sudden. The problem occurs constantly. The problem has been gradually worsening. The pain is located in the generalized abdominal region. The pain is at a severity of 5/10. The pain is moderate. The quality of the pain is colicky. The abdominal pain radiates to the back. Associated symptoms include belching, melena and nausea. Pertinent negatives include no arthralgias, constipation, diarrhea, dysuria, fever, frequency, headaches, hematuria, myalgias or vomiting. Nothing aggravates the pain. The pain is relieved by nothing. The treatment provided mild relief. Prior diagnostic workup includes CT scan. There is no history of abdominal surgery, colon cancer, Crohn's disease, gallstones, GERD, irritable bowel syndrome or ulcerative colitis.     Review of Systems  Constitutional: Negative.  Negative for chills, fatigue, fever and unexpected weight change.  HENT: Negative for congestion, ear discharge, ear pain, rhinorrhea, sinus pressure, sneezing and sore throat.   Eyes: Negative for photophobia, pain, discharge, redness and itching.  Respiratory: Negative for cough, shortness of breath, wheezing and stridor.   Gastrointestinal: Positive for melena and nausea. Negative for abdominal pain, blood in stool, constipation, diarrhea and vomiting.  Endocrine: Negative for cold intolerance, heat intolerance, polydipsia, polyphagia and polyuria.  Genitourinary: Negative for dysuria, flank pain,  frequency, hematuria, menstrual problem, pelvic pain, urgency, vaginal bleeding and vaginal discharge.  Musculoskeletal: Negative for arthralgias, back pain and myalgias.  Skin: Negative for rash.  Allergic/Immunologic: Negative for environmental allergies and food allergies.  Neurological: Negative for dizziness, weakness, light-headedness, numbness and headaches.  Hematological: Negative for adenopathy. Does not bruise/bleed easily.  Psychiatric/Behavioral: Negative for dysphoric mood. The patient is not nervous/anxious.     Patient Active Problem List   Diagnosis Date Noted  . Bowel obstruction ([place]) 11/29/2017  . Small bowel obstruction ([place])   . Thoracoabdominal aortic aneurysm (TAAA) ([place]) 11/19/2017  . Status post total knee replacement using cement, right 05/30/2017  . GERD (gastroesophageal reflux disease) 12/22/2015  . Adenomatous polyp 09/11/2014  . Bilateral cataracts 09/11/2014  . Gastric catarrh 09/11/2014  . Gastroduodenal ulcer 09/11/2014  . Hypothyroidism 09/11/2014  . Cardiac murmur 10/08/2013  . Essential (primary) hypertension 10/08/2013  . Combined fat and carbohydrate induced hyperlipemia 10/08/2013    Allergies  Allergen Reactions  . Adhesive [Tape] Dermatitis    Can not use plastic tape. Paper tape ok   . Penicillin G Itching    Has patient had a PCN reaction causing immediate rash, facial/tongue/throat swelling, SOB or lightheadedness with hypotension: No Has patient had a PCN reaction causing severe rash involving mucus membranes or skin necrosis: No Has patient had a PCN reaction that required hospitalization: No Has patient had a PCN reaction occurring within the last 10 years: No If all of the above answers are "NO", then may proceed with Cephalosporin use.   Marland Kitchen Hydrocodone-Acetaminophen Itching and Rash    Past Surgical History:  Procedure Laterality Date  . BREAST SURGERY Bilateral 1991   mastectomy  . CATARACT EXTRACTION Bilateral   .  CHOLECYSTECTOMY    . COLONOSCOPY  2013   Dr Gustavo Lah  . HALLUX VALGUS AUSTIN Right 06/03/2015   Procedure: HALLUX VALGUS AUSTIN;  Surgeon: Samara Deist, DPM;  Location: [place];  Service: Podiatry;  Laterality: Right;  WITH POPLITEAL  . KNEE ARTHROSCOPY WITH MEDIAL MENISECTOMY Right 08/30/2016   Procedure: KNEE ARTHROSCOPY WITH PARTIAL MEDIAL MENISECTOMY and Partial Lateral Menisectomy;  Surgeon: Hessie Knows, MD;  Location: [facility];  Service: Orthopedics;  Laterality: Right;  . KNEE ARTHROSCOPY WITH SUBCHONDROPLASTY Right 08/30/2016   Procedure: KNEE ARTHROSCOPY WITH SUBCHONDROPLASTY;  Surgeon: Hessie Knows, MD;  Location: [facility];  Service: Orthopedics;  Laterality: Right;  . MASTECTOMY Bilateral   . ROTATOR CUFF REPAIR Bilateral   . TONSILLECTOMY    . TOTAL KNEE ARTHROPLASTY Left   . TOTAL KNEE ARTHROPLASTY Right 05/30/2017   Procedure: TOTAL KNEE ARTHROPLASTY;  Surgeon: Hessie Knows, MD;  Location: [facility];  Service: Orthopedics;  Laterality: Right;  Marland Kitchen VAGINAL HYSTERECTOMY      Social History   Tobacco Use  . Smoking status: Former Smoker    Packs/day: 0.25    Years: 10.00    Pack years: 2.50    Types: Cigarettes    Last attempt to quit: 1991    Years since quitting: 28.8  . Smokeless tobacco: Never Used  . Tobacco comment: smoking cessation materials not required  Substance Use Topics  . Alcohol use: No    Alcohol/week: 0.0 standard drinks  . Drug use: No     Medication list has been reviewed and updated.  Current Meds  Medication Sig  . acetaminophen (TYLENOL) 325 MG tablet Take 1-2 tablets (325-650 mg total) by mouth every 6 (six) hours as needed for mild pain (pain score 1-3 or temp > 100.5).  Marland Kitchen amLODipine (NORVASC) 2.5 MG tablet Take 2.5 mg by mouth daily.  . celecoxib (CELEBREX) 200 MG capsule TAKE 1 CAPSULE BY MOUTH ONCE DAILY AS NEEDED FOR MODERATE PAIN  . nystatin cream (MYCOSTATIN) Apply 1 application topically 2 (two) times daily.  Marland Kitchen  omeprazole (PRILOSEC) 20 MG capsule Take 20 mg by mouth daily.  Marland Kitchen triamcinolone (NASACORT ALLERGY 24HR) 55 MCG/ACT AERO nasal inhaler Place 2 sprays into the nose daily.    PHQ 2/9 Scores 08/15/2017 04/06/2017 06/07/2016 05/11/2015  PHQ - 2 Score 0 0 0 0  PHQ- 9 Score 1 - - -    Physical Exam  Constitutional: She appears well-developed and well-nourished. No distress.  HENT:  Head: Normocephalic and atraumatic.  Right Ear: Hearing, tympanic membrane, external ear and ear canal normal.  Left Ear: Hearing, tympanic membrane, external ear and ear canal normal.  Nose: Nose normal. No mucosal edema.  Mouth/Throat: Uvula is midline and oropharynx is clear and moist.  Eyes: Pupils are equal, round, and reactive to light. Conjunctivae and EOM are normal. Right eye exhibits no discharge. Left eye exhibits no discharge.  Neck: Normal range of motion. Neck supple. Normal carotid pulses, no hepatojugular reflux and no JVD present. Carotid bruit is not present. No thyromegaly present.  Cardiovascular: Normal rate, regular rhythm, S1 normal, S2 normal, normal heart sounds and intact distal pulses. Exam reveals no gallop, no S3, no S4 and no friction rub.  No murmur heard.  No systolic murmur is present.  No diastolic murmur is present. Pulmonary/Chest: Effort normal and breath sounds normal.  Abdominal: Soft. She exhibits distension. She exhibits no mass. Bowel sounds are decreased. There is no hepatosplenomegaly. There is tenderness in the epigastric area. There  is no rigidity, no rebound, no guarding and no CVA tenderness.  Genitourinary: Rectal exam shows guaiac positive stool. Rectal exam shows no mass.  Musculoskeletal: Normal range of motion. She exhibits no edema.  Lymphadenopathy:       Head (right side): No submandibular adenopathy present.       Head (left side): No submandibular adenopathy present.    She has no cervical adenopathy.  Neurological: She is alert. She has normal reflexes.  Skin:  Skin is warm and dry. She is not diaphoretic.  Nursing note and vitals reviewed.   BP (!) 130/100   Pulse 64   Ht 5\' 5"  (1.651 m)   Wt 197 lb (89.4 kg)   BMI 32.78 kg/m   Assessment and Plan:  1. Small bowel obstruction (HCC) Acute. Sudden onset abdominal pain with belching/nausea/ and"red bowel movement that was sticky".Referral to er.  2. Guaiac positive stools Confirmed positive on rectal exam.                                                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 Hepatic cirrhosis Dr. [facility]  01/15/2018

## 2018-01-15 NOTE — H&P (Signed)
[facility] [place] at [facility]   PATIENT NAME: Kaitlyn Oliver    MR#:  [id number]  DATE OF BIRTH:  [birth year]  DATE OF ADMISSION:  01/15/2018  PRIMARY CARE PHYSICIAN: Duanne Limerick, MD   REQUESTING/REFERRING PHYSICIAN: Cyril Loosen, MD  CHIEF COMPLAINT:   Chief Complaint  Patient presents with  . Abdominal Pain    HISTORY OF PRESENT ILLNESS:  Kaitlyn Oliver  is a 84 y.o. female who presents with chief complaint as above.  Patient presents with abdominal pain that started about 36 hours ago.  His pain is epigastric, sharp in character, persistent with no alleviating factors per patient's report.  She finally came to the ED for evaluation, and on imaging here was found to have new pancreatic cyst, new renal cyst, and a small bowel lesion that is possibly a necrotic lymph node with surrounding lymphadenopathy.  Lipase was normal.  Hospitalist were called for admission and further evaluation  PAST MEDICAL HISTORY:   Past Medical History:  Diagnosis Date  . Anxiety   . Arthritis    thumbs, hands  . Cancer [facility]) 1991   bilateral breast  . Depression   . GERD (gastroesophageal reflux disease)   . Headache    occasional - pinched nerve in neck  . Hypertension   . Hypothyroidism   . Thyroid disease   . Wears dentures    full upper, partial lower     PAST SURGICAL HISTORY:   Past Surgical History:  Procedure Laterality Date  . BREAST SURGERY Bilateral 1991   mastectomy  . CATARACT EXTRACTION Bilateral   . CHOLECYSTECTOMY    . COLONOSCOPY  2013   Dr Marva Panda  . HALLUX VALGUS AUSTIN Right 06/03/2015   Procedure: HALLUX VALGUS AUSTIN;  Surgeon: Gwyneth Revels, DPM;  Location[facility];  Service: Podiatry;  Laterality: Right;  WITH POPLITEAL  . KNEE ARTHROSCOPY WITH MEDIAL MENISECTOMY Right 08/30/2016   Procedure: KNEE ARTHROSCOPY WITH PARTIAL MEDIAL MENISECTOMY and Partial Lateral Menisectomy;  Surgeon: Kennedy Bucker, MD;  Location: [facility];   Service: Orthopedics;  Laterality: Right;  . KNEE ARTHROSCOPY WITH SUBCHONDROPLASTY Right 08/30/2016   Procedure: KNEE ARTHROSCOPY WITH SUBCHONDROPLASTY;  Surgeon: Kennedy Bucker, MD;  Location: [facility];  Service: Orthopedics;  Laterality: Right;  . MASTECTOMY Bilateral   . ROTATOR CUFF REPAIR Bilateral   . TONSILLECTOMY    . TOTAL KNEE ARTHROPLASTY Left   . TOTAL KNEE ARTHROPLASTY Right 05/30/2017   Procedure: TOTAL KNEE ARTHROPLASTY;  Surgeon: Kennedy Bucker, MD;  Location: [facility];  Service: Orthopedics;  Laterality: Right;  Marland Kitchen VAGINAL HYSTERECTOMY       SOCIAL HISTORY:   Social History   Tobacco Use  . Smoking status: Former Smoker    Packs/day: 0.25    Years: 10.00    Pack years: 2.50    Types: Cigarettes    Last attempt to quit: 1991    Years since quitting: 28.8  . Smokeless tobacco: Never Used  . Tobacco comment: smoking cessation materials not required  Substance Use Topics  . Alcohol use: No    Alcohol/week: 0.0 standard drinks     FAMILY HISTORY:   Family History  Problem Relation Age of Onset  . Pulmonary embolism Mother   . Healthy Father      DRUG ALLERGIES:   Allergies  Allergen Reactions  . Adhesive [Tape] Dermatitis    Can not use plastic tape. Paper tape ok   . Penicillin G Itching    Has patient  had a PCN reaction causing immediate rash, facial/tongue/throat swelling, SOB or lightheadedness with hypotension: No Has patient had a PCN reaction causing severe rash involving mucus membranes or skin necrosis: No Has patient had a PCN reaction that required hospitalization: No Has patient had a PCN reaction occurring within the last 10 years: No If all of the above answers are "NO", then may proceed with Cephalosporin use.   Marland Kitchen Hydrocodone-Acetaminophen Itching and Rash    MEDICATIONS AT HOME:   Prior to Admission medications   Medication Sig Start Date End Date Taking? Authorizing Provider  amLODipine (NORVASC) 5 MG tablet Take 5 mg by mouth  daily. 12/26/17  Yes [provider]  acetaminophen (TYLENOL) 325 MG tablet Take 1-2 tablets (325-650 mg total) by mouth every 6 (six) hours as needed for mild pain (pain score 1-3 or temp > 100.5). 06/01/17   Evon Slack, PA-C  amLODipine (NORVASC) 2.5 MG tablet Take 2.5 mg by mouth daily. 11/16/17   [provider]  celecoxib (CELEBREX) 200 MG capsule TAKE 1 CAPSULE BY MOUTH ONCE DAILY AS NEEDED FOR MODERATE PAIN 10/24/17   [provider]  nystatin cream (MYCOSTATIN) Apply 1 application topically 2 (two) times daily. 08/15/17   Duanne Limerick, MD  omeprazole (PRILOSEC) 20 MG capsule Take 20 mg by mouth daily.    [provider]  Probiotic CAPS Take 1 capsule by mouth daily as needed (diarrhea).    [provider]  triamcinolone (NASACORT ALLERGY 24HR) 55 MCG/ACT AERO nasal inhaler Place 2 sprays into the nose daily. 12/22/15   Duanne Limerick, MD    REVIEW OF SYSTEMS:  Review of Systems  Constitutional: Negative for chills, fever, malaise/fatigue and weight loss.  HENT: Negative for ear pain, hearing loss and tinnitus.   Eyes: Negative for blurred vision, double vision, pain and redness.  Respiratory: Negative for cough, hemoptysis and shortness of breath.   Cardiovascular: Negative for chest pain, palpitations, orthopnea and leg swelling.  Gastrointestinal: Positive for abdominal pain and nausea. Negative for constipation, diarrhea and vomiting.  Genitourinary: Negative for dysuria, frequency and hematuria.  Musculoskeletal: Negative for back pain, joint pain and neck pain.  Skin:       No acne, rash, or lesions  Neurological: Negative for dizziness, tremors, focal weakness and weakness.  Endo/Heme/Allergies: Negative for polydipsia. Does not bruise/bleed easily.  Psychiatric/Behavioral: Negative for depression. The patient is not nervous/anxious and does not have insomnia.      VITAL SIGNS:   Vitals:   01/15/18 2130 01/15/18 2145  01/15/18 2300 01/15/18 2340  BP: (!) 161/92  140/84 131/73  Pulse: 64 64 71 71  Resp:    16  Temp:      TempSrc:      SpO2: 97% 96% 92% 98%  Weight:      Height:       Wt Readings from Last 3 Encounters:  01/15/18 89 kg  01/15/18 89.4 kg  12/15/17 92.1 kg    PHYSICAL EXAMINATION:  Physical Exam  Vitals reviewed. Constitutional: She is oriented to person, place, and time. She appears well-developed and well-nourished. No distress.  HENT:  Head: Normocephalic and atraumatic.  Mouth/Throat: Oropharynx is clear and moist.  Eyes: Pupils are equal, round, and reactive to light. Conjunctivae and EOM are normal. No scleral icterus.  Neck: Normal range of motion. Neck supple. No JVD present. No thyromegaly present.  Cardiovascular: Normal rate, regular rhythm and intact distal pulses. Exam reveals no gallop and no friction rub.  No murmur heard. Respiratory: Effort normal and breath sounds normal. No respiratory distress. She has no wheezes. She has no rales.  GI: Soft. Bowel sounds are normal. She exhibits no distension. There is tenderness.  Musculoskeletal: Normal range of motion. She exhibits no edema.  No arthritis, no gout  Lymphadenopathy:    She has no cervical adenopathy.  Neurological: She is alert and oriented to person, place, and time. No cranial nerve deficit.  No dysarthria, no aphasia  Skin: Skin is warm and dry. No rash noted. No erythema.  Psychiatric: She has a normal mood and affect. Her behavior is normal. Judgment and thought content normal.    LABORATORY PANEL:   CBC Recent Labs  Lab 01/15/18 1514  WBC 7.4  HGB 13.5  HCT 40.6  PLT 171   ------------------------------------------------------------------------------------------------------------------  Chemistries  Recent Labs  Lab 01/15/18 1514  NA 137  K 3.1*  CL 95*  CO2 28  GLUCOSE 109*  BUN 16  CREATININE 0.94  CALCIUM 10.3  AST 51*  ALT 47*  ALKPHOS 66  BILITOT 0.9    ------------------------------------------------------------------------------------------------------------------  Cardiac Enzymes No results for input(s): TROPONINI in the last 168 hours. ------------------------------------------------------------------------------------------------------------------  RADIOLOGY:  Ct Abdomen Pelvis W Contrast  Result Date: 01/15/2018 CLINICAL DATA:  Epigastric pain starting last night. Nausea. Prior cholecystectomy and hysterectomy. EXAM: CT ABDOMEN AND PELVIS WITH CONTRAST TECHNIQUE: Multidetector CT imaging of the abdomen and pelvis was performed using the standard protocol following bolus administration of intravenous contrast. CONTRAST:  30mL OMNIPAQUE IOHEXOL 300 MG/ML SOLN, OMNIPAQUE IOHEXOL 300 MG/ML SOLN COMPARISON:  None. FINDINGS: Lower chest: Coronary artery calcification is evident. Hepatobiliary: Nodular hepatic contour is consistent with cirrhosis. Gallbladder surgically absent. No intrahepatic or extrahepatic biliary dilation. Pancreas: 17 x 11 mm cystic lesion identified in the head of the pancreas (33/2). Adjacent 12 mm pancreatic head cyst identified on image 31/series 2. No dilatation of the main pancreatic duct. Spleen: No splenomegaly. No focal mass lesion. Adrenals/Urinary Tract: No adrenal nodule or mass. Cortical scarring noted in the kidneys bilaterally, right greater than left with tiny bilateral nonobstructing renal stones. 14 mm exophytic lesion in the upper pole of the left kidney has attenuation higher than would be expected for a simple cyst. 9 mm low-density lesion in the upper pole the right kidney is too small to characterize. No evidence for hydroureter. The urinary bladder appears normal for the degree of distention. Stomach/Bowel: Stomach is nondistended. No gastric wall thickening. No evidence of outlet obstruction. Duodenum is normally positioned as is the ligament of Treitz. No small bowel dilatation. There is a segment of  small bowel in the right lower quadrant (69/2) head appears mildly distended and shows some wall thickening. Just cranial to and contiguous with this abnormal small bowel segment is a 3.3 x 2.1 x 3.9 cm necrotic appearing soft tissue lesion, potentially a lymph node. This is well demonstrated on coronal image 43 of series 5. Other adjacent smaller lymph nodes are evident. The terminal ileum is normal. The appendix is not visualized, but there is no edema or inflammation in the region of the cecum. Diverticuli are seen scattered along the entire length of the colon without CT findings of diverticulitis. Vascular/Lymphatic: There is abdominal aortic atherosclerosis without aneurysm. No gastrohepatic or hepatoduodenal ligament lymphadenopathy. No retroperitoneal lymphadenopathy. No pelvic sidewall lymphadenopathy. Reproductive: Uterus surgically absent.  There is no adnexal mass. Other: No intraperitoneal free fluid. Musculoskeletal: No worrisome lytic or sclerotic osseous abnormality. IMPRESSION: 1. Abnormal small bowel loop  in the right lower quadrant with an adjacent 4 cm necrotic mesenteric lesion, potentially a lymph node. Other smaller lymph nodes are seen in the adjacent mesentery. There is no associated edema or inflammation to suggest that this represents inflammatory bowel disease. Imaging features are not characteristic for diverticulum. As such, small-bowel neoplasm, including carcinoid is a consideration. Lymphoma also possible. 2. Adjacent small hypoattenuating lesions in the pancreatic head appears cystic. These do not have classic appearance for adenocarcinoma and there is no associated ductal dilatation. However given the findings described immediately above, MRI of the pancreas without with contrast suggested to further evaluate. 3. Nodular hepatic contour is compatible with cirrhosis. 4. Bilateral renal cortical scarring with bilateral tiny nonobstructing renal stones. 14 mm exophytic lesion upper  pole left kidney cannot be definitively characterized. If abdominal MRIs performed, attention to the left kidney recommended. Otherwise surveillance may be warranted. Electronically Signed   By: Kennith Center M.D.   On: 01/15/2018 20:36    EKG:   Orders placed or performed during the hospital encounter of 01/15/18  . ED EKG  . ED EKG    IMPRESSION AND PLAN:  Principal Problem:   Pancreatic cyst -this in conjunction with a new renal cyst, and small bowel lesion all concerning for some possible malignancy.  We will get an oncology consult.  PRN analgesia tonight for pain control Active Problems:   Essential (primary) hypertension -continue home dose antihypertensives   Hypothyroidism -home dose thyroid replacement   GERD (gastroesophageal reflux disease) -Home dose PPI   Anxiety and depression -home dose anxiolytic and antidepressant  Chart review performed and case discussed with ED provider. Labs, imaging and/or ECG reviewed by provider and discussed with patient/family. Management plans discussed with the patient and/or family.  DVT PROPHYLAXIS: SubQ lovenox   GI PROPHYLAXIS:  PPI   ADMISSION STATUS: Inpatient     CODE STATUS: Full Code Status History    Date Active Date Inactive Code Status Order ID Comments User Context   11/29/2017 1343 12/01/2017 1723 Full Code [id number]  Bertrum Sol, MD Inpatient   05/30/2017 1027 06/01/2017 1831 Full Code [id number]  Kennedy Bucker, MD Inpatient   08/30/2016 1444 08/30/2016 1855 Full Code [id number]  Kennedy Bucker, MD Inpatient      TOTAL TIME TAKING CARE OF THIS PATIENT: 45 minutes.   Dariusz Brase FIELDING 01/15/2018, 11:49 PM  [employer]  [phone]  CC: Primary care physician; Duanne Limerick, MD  Note:  This document was prepared using Dragon voice recognition software and may include unintentional dictation errors.

## 2018-01-15 NOTE — ED Provider Notes (Signed)
[facility] Emergency Department Provider Note   ____________________________________________    I have reviewed the triage vital signs and the nursing notes.   HISTORY  Chief Complaint Abdominal Pain     HPI Kaitlyn Oliver is a 84 y.o. female who presents with complaints of abdominal pain.  Patient describes epigastric abdominal discomfort which is been constant over the last 12 to 16 hours.  She reports it started yesterday after dinner, she had a cheeseburger.  1 hour later she developed this pain in her abdomen.  She does not take anything for this.  Is not gotten any better.  Occasionally it seems to radiate into her back.  Does not drink alcohol.  Review of records demonstrates admission in the past for SBO with similar pain   Past Medical History:  Diagnosis Date  . Anxiety   . Arthritis    thumbs, hands  . Cancer [facility]) 1991   bilateral breast  . Depression   . GERD (gastroesophageal reflux disease)   . Headache    occasional - pinched nerve in neck  . Hypertension   . Hypothyroidism   . Thyroid disease   . Wears dentures    full upper, partial lower    Patient Active Problem List   Diagnosis Date Noted  . Bowel obstruction ([place]) 11/29/2017  . Small bowel obstruction ([place])   . Thoracoabdominal aortic aneurysm (TAAA) ([place]) 11/19/2017  . Status post total knee replacement using cement, right 05/30/2017  . GERD (gastroesophageal reflux disease) 12/22/2015  . Adenomatous polyp 09/11/2014  . Bilateral cataracts 09/11/2014  . Gastric catarrh 09/11/2014  . Gastroduodenal ulcer 09/11/2014  . Hypothyroidism 09/11/2014  . Cardiac murmur 10/08/2013  . Essential (primary) hypertension 10/08/2013  . Combined fat and carbohydrate induced hyperlipemia 10/08/2013    Past Surgical History:  Procedure Laterality Date  . BREAST SURGERY Bilateral 1991   mastectomy  . CATARACT EXTRACTION Bilateral   . CHOLECYSTECTOMY    . COLONOSCOPY   2013   Dr Gustavo Lah  . HALLUX VALGUS AUSTIN Right 06/03/2015   Procedure: HALLUX VALGUS AUSTIN;  Surgeon: Samara Deist, DPM;  Location: [place];  Service: Podiatry;  Laterality: Right;  WITH POPLITEAL  . KNEE ARTHROSCOPY WITH MEDIAL MENISECTOMY Right 08/30/2016   Procedure: KNEE ARTHROSCOPY WITH PARTIAL MEDIAL MENISECTOMY and Partial Lateral Menisectomy;  Surgeon: Hessie Knows, MD;  Location: [facility];  Service: Orthopedics;  Laterality: Right;  . KNEE ARTHROSCOPY WITH SUBCHONDROPLASTY Right 08/30/2016   Procedure: KNEE ARTHROSCOPY WITH SUBCHONDROPLASTY;  Surgeon: Hessie Knows, MD;  Location: [facility];  Service: Orthopedics;  Laterality: Right;  . MASTECTOMY Bilateral   . ROTATOR CUFF REPAIR Bilateral   . TONSILLECTOMY    . TOTAL KNEE ARTHROPLASTY Left   . TOTAL KNEE ARTHROPLASTY Right 05/30/2017   Procedure: TOTAL KNEE ARTHROPLASTY;  Surgeon: Hessie Knows, MD;  Location: [facility];  Service: Orthopedics;  Laterality: Right;  Marland Kitchen VAGINAL HYSTERECTOMY      Prior to Admission medications   Medication Sig Start Date End Date Taking? Authorizing Provider  acetaminophen (TYLENOL) 325 MG tablet Take 1-2 tablets (325-650 mg total) by mouth every 6 (six) hours as needed for mild pain (pain score 1-3 or temp > 100.5). 06/01/17   Duanne Guess, PA-C  amLODipine (NORVASC) 2.5 MG tablet Take 2.5 mg by mouth daily. 11/16/17   [provider]  celecoxib (CELEBREX) 200 MG capsule TAKE 1 CAPSULE BY MOUTH ONCE DAILY AS NEEDED FOR MODERATE PAIN 10/24/17   [provider]  nystatin cream (MYCOSTATIN) Apply 1 application topically 2 (two) times daily. 08/15/17   Juline Patch, MD  omeprazole (PRILOSEC) 20 MG capsule Take 20 mg by mouth daily.    [provider]  Probiotic CAPS Take 1 capsule by mouth daily as needed (diarrhea).    [provider]  triamcinolone (NASACORT ALLERGY 24HR) 55 MCG/ACT AERO nasal inhaler Place 2 sprays into the nose daily. 12/22/15   Juline Patch, MD     Allergies Adhesive [tape]; Penicillin g; and Hydrocodone-acetaminophen  Family History  Problem Relation Age of Onset  . Pulmonary embolism Mother   . Healthy Father     Social History Social History   Tobacco Use  . Smoking status: Former Smoker    Packs/day: 0.25    Years: 10.00    Pack years: 2.50    Types: Cigarettes    Last attempt to quit: 1991    Years since quitting: 28.8  . Smokeless tobacco: Never Used  . Tobacco comment: smoking cessation materials not required  Substance Use Topics  . Alcohol use: No    Alcohol/week: 0.0 standard drinks  . Drug use: No    Review of Systems  Constitutional: No fever/chills Eyes: No visual changes.  ENT: No sore throat. Cardiovascular: Denies chest pain. Respiratory: Denies shortness of breath. Gastrointestinal: As above Genitourinary: Negative for dysuria. Musculoskeletal: Negative for back pain. Skin: Negative for rash. Neurological: Negative for headaches or weakness   ____________________________________________   PHYSICAL EXAM:  VITAL SIGNS: ED Triage Vitals [01/15/18 1512]  Enc Vitals Group     BP (!) 188/95     Pulse Rate 65     Resp 18     Temp 97.7 F (36.5 C)     Temp Source Oral     SpO2 100 %     Weight 89 kg (196 lb 3.4 oz)     Height 1.651 m (5\' 5" )     Head Circumference      Peak Flow      Pain Score 10     Pain Loc      Pain Edu?      Excl. in [place]?     Constitutional: Alert and oriented.  Eyes: Conjunctivae are normal.   Nose: No congestion/rhinnorhea. Mouth/Throat: Mucous membranes are moist.    Cardiovascular: Normal rate, regular rhythm.  Good peripheral circulation. Respiratory: Normal respiratory effort.  No retractions. Lungs CTAB. Gastrointestinal: Mild tenderness palpation in the epigastrium, no distention, no CVA tenderness  Musculoskeletal: No lower extremity tenderness nor edema.  Warm and well perfused Neurologic:  Normal speech and language. No  gross focal neurologic deficits are appreciated.  Skin:  Skin is warm, dry and intact. No rash noted. Psychiatric: Mood and affect are normal. Speech and behavior are normal.  ____________________________________________   LABS (all labs ordered are listed, but only abnormal results are displayed)  Labs Reviewed  COMPREHENSIVE METABOLIC PANEL - Abnormal; Notable for the following components:      Result Value   Potassium 3.1 (*)    Chloride 95 (*)    Glucose, Bld 109 (*)    AST 51 (*)    ALT 47 (*)    GFR calc non Af Amer 55 (*)    All other components within normal limits  URINALYSIS, COMPLETE (UACMP) WITH MICROSCOPIC - Abnormal; Notable for the following components:   Color, Urine YELLOW (*)    APPearance CLEAR (*)    Hgb urine dipstick SMALL (*)  Ketones, ur 5 (*)    Bacteria, UA RARE (*)    All other components within normal limits  LIPASE, BLOOD  CBC   ____________________________________________  EKG  ED ECG REPORT I, Lavonia Drafts, the attending physician, personally viewed and interpreted this ECG.  Date: 01/15/2018  Rhythm: normal sinus rhythm QRS Axis: normal Intervals: normal ST/T Wave abnormalities: normal Narrative Interpretation: no evidence of acute ischemia  ____________________________________________  RADIOLOGY  CT abdomen pelvis ____________________________________________   PROCEDURES  Procedure(s) performed: No  Procedures   Critical Care performed: No ____________________________________________   INITIAL IMPRESSION / ASSESSMENT AND PLAN / ED COURSE  Pertinent labs & imaging results that were available during my care of the patient were reviewed by me and considered in my medical decision making (see chart for details).  Patient presents with epigastric abdominal pain, she is mildly tenderness in this area.  She does feel occasional radiation to her back.  Reviewed CT scanning from prior visits, no history of abdominal aortic  aneurysm, does have widening of the a sending aorta.  Did have an SBO in September with similar symptoms, will need repeat imaging, IV morphine, IV Zofran ordered  CT of the pelvis results discussed at length with patient.  Cystic lesions on pancreas and kidney, cirrhosis of liver, possibly necrotic lymph node.  Patient continues to have pain although significant improved.  Will give an additional dose of IV morphine and admit to the hospital service for continued pain control and further work-up    ____________________________________________   FINAL CLINICAL IMPRESSION(S) / ED DIAGNOSES  Final diagnoses:  Epigastric pain  Pancreatic cyst  Kidney cysts        Note:  This document was prepared using Dragon voice recognition software and may include unintentional dictation errors.    Lavonia Drafts, MD 01/15/18 2156

## 2018-01-16 ENCOUNTER — Inpatient Hospital Stay: Payer: Medicare HMO

## 2018-01-16 ENCOUNTER — Encounter: Payer: Self-pay | Admitting: Anesthesiology

## 2018-01-16 DIAGNOSIS — K7460 Unspecified cirrhosis of liver: Secondary | ICD-10-CM

## 2018-01-16 DIAGNOSIS — R1900 Intra-abdominal and pelvic swelling, mass and lump, unspecified site: Secondary | ICD-10-CM

## 2018-01-16 DIAGNOSIS — K862 Cyst of pancreas: Secondary | ICD-10-CM

## 2018-01-16 DIAGNOSIS — Z87891 Personal history of nicotine dependence: Secondary | ICD-10-CM

## 2018-01-16 DIAGNOSIS — D490 Neoplasm of unspecified behavior of digestive system: Secondary | ICD-10-CM

## 2018-01-16 LAB — BASIC METABOLIC PANEL WITH GFR
Anion gap: 9 (ref 5–15)
BUN: 14 mg/dL (ref 8–23)
CO2: 31 mmol/L (ref 22–32)
Calcium: 9.3 mg/dL (ref 8.9–10.3)
Chloride: 99 mmol/L (ref 98–111)
Creatinine, Ser: 0.85 mg/dL (ref 0.44–1.00)
GFR calc Af Amer: >60 mL/min (ref >60)
GFR calc non Af Amer: >60 mL/min (ref >60)
Glucose, Bld: 94 mg/dL (ref 70–99)
Potassium: 2.7 mmol/L — CL (ref 3.5–5.1)
Sodium: 139 mmol/L (ref 135–145)

## 2018-01-16 LAB — CBC
HCT: 36.6 % (ref 36.0–46.0)
Hemoglobin: 12.2 g/dL (ref 12.0–15.0)
MCH: 29.4 pg (ref 26.0–34.0)
MCHC: 33.3 g/dL (ref 30.0–36.0)
MCV: 88.2 fL (ref 80.0–100.0)
PLATELETS: 154 10*3/uL (ref 150–400)
RBC: 4.15 MIL/uL (ref 3.87–5.11)
RDW: 14.2 % (ref 11.5–15.5)
WBC: 6.2 10*3/uL (ref 4.0–10.5)
nRBC: 0 % (ref 0.0–0.2)

## 2018-01-16 LAB — MAGNESIUM: Magnesium: 1.6 mg/dL — ABNORMAL LOW (ref 1.7–2.4)

## 2018-01-16 MED ORDER — POTASSIUM CHLORIDE 10 MEQ/100ML IV SOLN: 10.0000 meq | INTRAVENOUS | Status: DC

## 2018-01-16 MED ORDER — POTASSIUM CHLORIDE 10 MEQ/100ML IV SOLN
10.0000 meq | INTRAVENOUS | Status: DC
  Administered 2018-01-16: 10 meq via INTRAVENOUS
  Filled 2018-01-16: qty 100

## 2018-01-16 MED ORDER — SODIUM CHLORIDE 0.9 % IV SOLN
1.0000 g | Freq: Once | INTRAVENOUS | Status: AC
  Administered 2018-01-17 (×2): 1 g via INTRAVENOUS
  Filled 2018-01-16: qty 1

## 2018-01-16 MED ORDER — ACETAMINOPHEN 650 MG RE SUPP: 650.0000 mg | Freq: Four times a day (QID) | RECTAL | Status: DC | PRN

## 2018-01-16 MED ORDER — HYDROMORPHONE HCL 1 MG/ML IJ SOLN: 0.5000 mg | INTRAMUSCULAR | Status: DC | PRN

## 2018-01-16 MED ORDER — SODIUM CHLORIDE 0.9 % IV SOLN
INTRAVENOUS | Status: DC
  Administered 2018-01-16 – 2018-01-18 (×5): via INTRAVENOUS

## 2018-01-16 MED ORDER — OXYCODONE HCL 5 MG PO TABS
5.0000 mg | ORAL_TABLET | ORAL | Status: DC | PRN
  Administered 2018-01-16 – 2018-01-17 (×2): 5 mg via ORAL
  Filled 2018-01-16 (×2): qty 1

## 2018-01-16 MED ORDER — HYDRALAZINE HCL 20 MG/ML IJ SOLN: 10.0000 mg | Freq: Four times a day (QID) | INTRAMUSCULAR | Status: DC | PRN

## 2018-01-16 MED ORDER — POTASSIUM CHLORIDE CRYS ER 20 MEQ PO TBCR
40.0000 meq | EXTENDED_RELEASE_TABLET | Freq: Once | ORAL | Status: AC
  Administered 2018-01-16: 09:00:00 40 meq via ORAL
  Filled 2018-01-16: qty 2

## 2018-01-16 MED ORDER — PANTOPRAZOLE SODIUM 40 MG PO TBEC
40.0000 mg | DELAYED_RELEASE_TABLET | Freq: Every day | ORAL | Status: DC
  Administered 2018-01-16 – 2018-01-19 (×3): 40 mg via ORAL
  Filled 2018-01-16 (×3): qty 1

## 2018-01-16 MED ORDER — ONDANSETRON HCL 4 MG/2ML IJ SOLN
4.0000 mg | Freq: Four times a day (QID) | INTRAMUSCULAR | Status: DC | PRN
  Administered 2018-01-17: 21:00:00 4 mg via INTRAVENOUS

## 2018-01-16 MED ORDER — ONDANSETRON HCL 4 MG PO TABS: 4.0000 mg | ORAL_TABLET | Freq: Four times a day (QID) | ORAL | Status: DC | PRN

## 2018-01-16 MED ORDER — AMLODIPINE BESYLATE 5 MG PO TABS
5.0000 mg | ORAL_TABLET | Freq: Every day | ORAL | Status: DC
  Administered 2018-01-16 – 2018-01-19 (×3): 5 mg via ORAL
  Filled 2018-01-16 (×3): qty 1

## 2018-01-16 MED ORDER — ACETAMINOPHEN 325 MG PO TABS: 650.0000 mg | ORAL_TABLET | Freq: Four times a day (QID) | ORAL | Status: DC | PRN

## 2018-01-16 MED ORDER — GADOBUTROL 1 MMOL/ML IV SOLN
10.0000 mL | Freq: Once | INTRAVENOUS | Status: AC | PRN
  Administered 2018-01-16: 15:00:00 9 mL via INTRAVENOUS

## 2018-01-16 MED ORDER — POTASSIUM CHLORIDE CRYS ER 20 MEQ PO TBCR
40.0000 meq | EXTENDED_RELEASE_TABLET | Freq: Once | ORAL | Status: AC
  Administered 2018-01-16: 18:00:00 40 meq via ORAL
  Filled 2018-01-16: qty 2

## 2018-01-16 MED ORDER — MAGNESIUM SULFATE 2 GM/50ML IV SOLN
2.0000 g | Freq: Once | INTRAVENOUS | Status: AC
  Administered 2018-01-16: 2 g via INTRAVENOUS
  Filled 2018-01-16: qty 50

## 2018-01-16 MED ORDER — POTASSIUM CHLORIDE 10 MEQ/100ML IV SOLN
10.0000 meq | INTRAVENOUS | Status: AC
  Administered 2018-01-16 – 2018-01-17 (×3): 10 meq via INTRAVENOUS
  Filled 2018-01-16 (×3): qty 100

## 2018-01-16 MED ORDER — ENOXAPARIN SODIUM 40 MG/0.4ML ~~LOC~~ SOLN
40.0000 mg | SUBCUTANEOUS | Status: DC
  Administered 2018-01-16 – 2018-01-19 (×3): 40 mg via SUBCUTANEOUS
  Filled 2018-01-16 (×4): qty 0.4

## 2018-01-16 MED ORDER — CHLORHEXIDINE GLUCONATE CLOTH 2 % EX PADS
6.0000 | MEDICATED_PAD | Freq: Every day | CUTANEOUS | Status: DC
  Administered 2018-01-19: 6 via TOPICAL

## 2018-01-16 NOTE — Progress Notes (Signed)
[facility] - [place] at [facility]   PATIENT NAME: Kaitlyn Oliver    MR#:  [id number]  DATE OF BIRTH:  1933/11/10  SUBJECTIVE:  CHIEF COMPLAINT:   Chief Complaint  Patient presents with  . Abdominal Pain   Abdominal pain is improved.  REVIEW OF SYSTEMS:    Review of Systems  Constitutional: Positive for malaise/fatigue. Negative for chills and fever.  HENT: Negative for sore throat.   Eyes: Negative for blurred vision, double vision and pain.  Respiratory: Negative for cough, hemoptysis, shortness of breath and wheezing.   Cardiovascular: Negative for chest pain, palpitations, orthopnea and leg swelling.  Gastrointestinal: Positive for abdominal pain. Negative for constipation, diarrhea, heartburn, nausea and vomiting.  Genitourinary: Negative for dysuria and hematuria.  Musculoskeletal: Negative for back pain and joint pain.  Skin: Negative for rash.  Neurological: Negative for sensory change, speech change, focal weakness and headaches.  Endo/Heme/Allergies: Does not bruise/bleed easily.  Psychiatric/Behavioral: Negative for depression. The patient is not nervous/anxious.     DRUG ALLERGIES:   Allergies  Allergen Reactions  . Adhesive [Tape] Dermatitis    Can not use plastic tape. Paper tape ok   . Penicillin G Itching    Has patient had a PCN reaction causing immediate rash, facial/tongue/throat swelling, SOB or lightheadedness with hypotension: No Has patient had a PCN reaction causing severe rash involving mucus membranes or skin necrosis: No Has patient had a PCN reaction that required hospitalization: No Has patient had a PCN reaction occurring within the last 10 years: No If all of the above answers are "NO", then may proceed with Cephalosporin use.   Marland Kitchen Hydrocodone-Acetaminophen Itching and Rash    VITALS:  Blood pressure 125/77, pulse 66, temperature 98.2 F (36.8 C), temperature source Oral, resp. rate 20, height 5\' 5"  (1.651 m), weight 89.8  kg, SpO2 95 %.  PHYSICAL EXAMINATION:   Physical Exam  GENERAL:  84 y.o.-year-old patient lying in the bed with no acute distress.  EYES: Pupils equal, round, reactive to light and accommodation. No scleral icterus. Extraocular muscles intact.  HEENT: Head atraumatic, normocephalic. Oropharynx and nasopharynx clear.  NECK:  Supple, no jugular venous distention. No thyroid enlargement, no tenderness.  LUNGS: Normal breath sounds bilaterally, no wheezing, rales, rhonchi. No use of accessory muscles of respiration.  CARDIOVASCULAR: S1, S2 normal. No murmurs, rubs, or gallops.  ABDOMEN: Soft, nontender, nondistended. Bowel sounds present. No organomegaly or mass.  EXTREMITIES: No cyanosis, clubbing or edema b/l.    NEUROLOGIC: Cranial nerves II through XII are intact. No focal Motor or sensory deficits b/l.   PSYCHIATRIC: The patient is alert and oriented x 3.  SKIN: No obvious rash, lesion, or ulcer.   LABORATORY PANEL:   CBC Recent Labs  Lab 01/16/18 0444  WBC 6.2  HGB 12.2  HCT 36.6  PLT 154   ------------------------------------------------------------------------------------------------------------------ Chemistries  Recent Labs  Lab 01/15/18 1514 01/16/18 0444  NA 137 139  K 3.1* 2.7*  CL 95* 99  CO2 28 31  GLUCOSE 109* 94  BUN 16 14  CREATININE 0.94 0.85  CALCIUM 10.3 9.3  MG  --  1.6*  AST 51*  --   ALT 47*  --   ALKPHOS 66  --   BILITOT 0.9  --    ------------------------------------------------------------------------------------------------------------------  Cardiac Enzymes No results for input(s): TROPONINI in the last 168 hours. ------------------------------------------------------------------------------------------------------------------  RADIOLOGY:  Ct Abdomen Pelvis W Contrast  Result Date: 01/15/2018 CLINICAL DATA:  Epigastric pain starting last night. Nausea.  Prior cholecystectomy and hysterectomy. EXAM: CT ABDOMEN AND PELVIS WITH CONTRAST  TECHNIQUE: Multidetector CT imaging of the abdomen and pelvis was performed using the standard protocol following bolus administration of intravenous contrast. CONTRAST:  30mL OMNIPAQUE IOHEXOL 300 MG/ML SOLN, OMNIPAQUE IOHEXOL 300 MG/ML SOLN COMPARISON:  None. FINDINGS: Lower chest: Coronary artery calcification is evident. Hepatobiliary: Nodular hepatic contour is consistent with cirrhosis. Gallbladder surgically absent. No intrahepatic or extrahepatic biliary dilation. Pancreas: 17 x 11 mm cystic lesion identified in the head of the pancreas (33/2). Adjacent 12 mm pancreatic head cyst identified on image 31/series 2. No dilatation of the main pancreatic duct. Spleen: No splenomegaly. No focal mass lesion. Adrenals/Urinary Tract: No adrenal nodule or mass. Cortical scarring noted in the kidneys bilaterally, right greater than left with tiny bilateral nonobstructing renal stones. 14 mm exophytic lesion in the upper pole of the left kidney has attenuation higher than would be expected for a simple cyst. 9 mm low-density lesion in the upper pole the right kidney is too small to characterize. No evidence for hydroureter. The urinary bladder appears normal for the degree of distention. Stomach/Bowel: Stomach is nondistended. No gastric wall thickening. No evidence of outlet obstruction. Duodenum is normally positioned as is the ligament of Treitz. No small bowel dilatation. There is a segment of small bowel in the right lower quadrant (69/2) head appears mildly distended and shows some wall thickening. Just cranial to and contiguous with this abnormal small bowel segment is a 3.3 x 2.1 x 3.9 cm necrotic appearing soft tissue lesion, potentially a lymph node. This is well demonstrated on coronal image 43 of series 5. Other adjacent smaller lymph nodes are evident. The terminal ileum is normal. The appendix is not visualized, but there is no edema or inflammation in the region of the cecum. Diverticuli are seen  scattered along the entire length of the colon without CT findings of diverticulitis. Vascular/Lymphatic: There is abdominal aortic atherosclerosis without aneurysm. No gastrohepatic or hepatoduodenal ligament lymphadenopathy. No retroperitoneal lymphadenopathy. No pelvic sidewall lymphadenopathy. Reproductive: Uterus surgically absent.  There is no adnexal mass. Other: No intraperitoneal free fluid. Musculoskeletal: No worrisome lytic or sclerotic osseous abnormality. IMPRESSION: 1. Abnormal small bowel loop in the right lower quadrant with an adjacent 4 cm necrotic mesenteric lesion, potentially a lymph node. Other smaller lymph nodes are seen in the adjacent mesentery. There is no associated edema or inflammation to suggest that this represents inflammatory bowel disease. Imaging features are not characteristic for diverticulum. As such, small-bowel neoplasm, including carcinoid is a consideration. Lymphoma also possible. 2. Adjacent small hypoattenuating lesions in the pancreatic head appears cystic. These do not have classic appearance for adenocarcinoma and there is no associated ductal dilatation. However given the findings described immediately above, MRI of the pancreas without with contrast suggested to further evaluate. 3. Nodular hepatic contour is compatible with cirrhosis. 4. Bilateral renal cortical scarring with bilateral tiny nonobstructing renal stones. 14 mm exophytic lesion upper pole left kidney cannot be definitively characterized. If abdominal MRIs performed, attention to the left kidney recommended. Otherwise surveillance may be warranted. Electronically Signed   By: Kennith Center M.D.   On: 01/15/2018 20:36     ASSESSMENT AND PLAN:   *Mesentric necrosis and pancreatic cyst.  Concern regarding carcinoma.  Ordered CEA, MRI of abdomen.  Surgery and oncology consulted.  Normal pain improved.  N.p.o. at this time. Sent epic message to Dr. Valaria Good and added to his list.  Discussed with Dr.  Smith Robert.  *Hypokalemia.  Will replace oral and IV.  *Hypertension.  Continue home medications  *Hypothyroidism.  On levothyroxine.  DVT prophylaxis with Lovenox  All the records are reviewed and case discussed with Care Management/Social Worker Management plans discussed with the patient, family and they are in agreement.  CODE STATUS: FULL CODE  TOTAL TIME TAKING CARE OF THIS PATIENT: 30 minutes.   POSSIBLE D/C IN 2-3 DAYS, DEPENDING ON CLINICAL CONDITION.  Molinda Bailiff Farha Dano M.D on 01/16/2018 at 1:05 PM  Between 7am to 6pm - Pager - [phone]  After 6pm go to [email or web address] - password [email or web address] [facility]  [facility]  Office  [phone]  CC: Primary care physician; Duanne Limerick, MD  Note: This dictation was prepared with Dragon dictation along with smaller phrase technology. Any transcriptional errors that result from this process are unintentional.

## 2018-01-16 NOTE — Consult Note (Signed)
Hematology/Oncology Consult note [facility] Telephone:([phone] [phone]  Patient Care Team: Juline Patch, MD as PCP - General (Family Medicine)   Name of the patient: Kaitlyn Oliver  [id number]  [birth year]    Reason for consult: small bowel mass. Pancreatic cyst   Requesting physician: Dr. Darvin Neighbours  Date of visit: 01/16/2018    History of presenting illness-patient is a 84 year old female with a past medical history significant for hypertension GERD thoracic abdominal aneurysm anxiety and depression who presented to the ER with symptoms of epigastric pain and nausea.  Patient also was admitted for an episode of bowel obstruction about 2 months ago which resolved conservatively.  CT abdomen done during this admission showed an abnormal small bowel loop in the right lower quadrant and adjacent 4 cm necrotic mesenteric lesion which could be a potential lymph node.  Patient also noted to have hypoattenuating cystic lesions in the head of the pancreas.  Hepatic cirrhosis.  This was followed by MRI of the abdomen with and without contrast which again showed multiple cystic lesions in the pancreas either pancreatic pseudocyst or cystic neoplasm of the pancreas.  Patient has seen Dr. Dahlia Byes from surgery who plans to do an exploratory laparotomy and possible resection of the small bowel lesion and the adjacent node tomorrow  Her abdominal pain has resolved since admission. She is moving her bowels.  ECOG PS- 1  Pain scale- 0   Review of systems- Review of Systems  Constitutional: Positive for malaise/fatigue. Negative for chills, fever and weight loss.  HENT: Negative for congestion, ear discharge and nosebleeds.   Eyes: Negative for blurred vision.  Respiratory: Negative for cough, hemoptysis, sputum production, shortness of breath and wheezing.   Cardiovascular: Negative for chest pain, palpitations, orthopnea and claudication.  Gastrointestinal:  Positive for abdominal pain. Negative for blood in stool, constipation, diarrhea, heartburn, melena, nausea and vomiting.  Genitourinary: Negative for dysuria, flank pain, frequency, hematuria and urgency.  Musculoskeletal: Negative for back pain, joint pain and myalgias.  Skin: Negative for rash.  Neurological: Negative for dizziness, tingling, focal weakness, seizures, weakness and headaches.  Endo/Heme/Allergies: Does not bruise/bleed easily.  Psychiatric/Behavioral: Negative for depression and suicidal ideas. The patient does not have insomnia.     Allergies  Allergen Reactions  . Adhesive [Tape] Dermatitis    Can not use plastic tape. Paper tape ok   . Penicillin G Itching    Has patient had a PCN reaction causing immediate rash, facial/tongue/throat swelling, SOB or lightheadedness with hypotension: No Has patient had a PCN reaction causing severe rash involving mucus membranes or skin necrosis: No Has patient had a PCN reaction that required hospitalization: No Has patient had a PCN reaction occurring within the last 10 years: No If all of the above answers are "NO", then may proceed with Cephalosporin use.   Marland Kitchen Hydrocodone-Acetaminophen Itching and Rash    Patient Active Problem List   Diagnosis Date Noted  . Small bowel tumor   . Pancreatic cyst 01/15/2018  . Anxiety and depression 01/15/2018  . Pancreas cyst 01/15/2018  . Bowel obstruction ([place]) 11/29/2017  . Small bowel obstruction ([place])   . Thoracoabdominal aortic aneurysm (TAAA) ([place]) 11/19/2017  . Status post total knee replacement using cement, right 05/30/2017  . GERD (gastroesophageal reflux disease) 12/22/2015  . Adenomatous polyp 09/11/2014  . Bilateral cataracts 09/11/2014  . Gastric catarrh 09/11/2014  . Gastroduodenal ulcer 09/11/2014  . Hypothyroidism 09/11/2014  . Cardiac murmur 10/08/2013  . Essential (primary) hypertension  10/08/2013  . Combined fat and carbohydrate induced hyperlipemia 10/08/2013      Past Medical History:  Diagnosis Date  . Anxiety   . Arthritis    thumbs, hands  . Cancer [facility]) 1991   bilateral breast  . Depression   . GERD (gastroesophageal reflux disease)   . Headache    occasional - pinched nerve in neck  . Hypertension   . Hypothyroidism   . Thyroid disease   . Wears dentures    full upper, partial lower     Past Surgical History:  Procedure Laterality Date  . BREAST SURGERY Bilateral 1991   mastectomy  . CATARACT EXTRACTION Bilateral   . CHOLECYSTECTOMY    . COLONOSCOPY  2013   Dr Gustavo Lah  . HALLUX VALGUS AUSTIN Right 06/03/2015   Procedure: HALLUX VALGUS AUSTIN;  Surgeon: Samara Deist, DPM;  Location: [place];  Service: Podiatry;  Laterality: Right;  WITH POPLITEAL  . KNEE ARTHROSCOPY WITH MEDIAL MENISECTOMY Right 08/30/2016   Procedure: KNEE ARTHROSCOPY WITH PARTIAL MEDIAL MENISECTOMY and Partial Lateral Menisectomy;  Surgeon: Hessie Knows, MD;  Location: [facility];  Service: Orthopedics;  Laterality: Right;  . KNEE ARTHROSCOPY WITH SUBCHONDROPLASTY Right 08/30/2016   Procedure: KNEE ARTHROSCOPY WITH SUBCHONDROPLASTY;  Surgeon: Hessie Knows, MD;  Location: [facility];  Service: Orthopedics;  Laterality: Right;  . MASTECTOMY Bilateral   . ROTATOR CUFF REPAIR Bilateral   . TONSILLECTOMY    . TOTAL KNEE ARTHROPLASTY Left   . TOTAL KNEE ARTHROPLASTY Right 05/30/2017   Procedure: TOTAL KNEE ARTHROPLASTY;  Surgeon: Hessie Knows, MD;  Location: [facility];  Service: Orthopedics;  Laterality: Right;  Marland Kitchen VAGINAL HYSTERECTOMY      Social History   Socioeconomic History  . Marital status: Soil scientist    Spouse name: Not on file  . Number of children: 1  . Years of education: Not on file  . Highest education level: 12th grade  Occupational History  . Occupation: Retired  Scientific laboratory technician  . Financial resource strain: Not hard at all  . Food insecurity:    Worry: Never true    Inability: Never true  . Transportation needs:     Medical: No    Non-medical: No  Tobacco Use  . Smoking status: Former Smoker    Packs/day: 0.25    Years: 10.00    Pack years: 2.50    Types: Cigarettes    Last attempt to quit: 1991    Years since quitting: 28.8  . Smokeless tobacco: Never Used  . Tobacco comment: smoking cessation materials not required  Substance and Sexual Activity  . Alcohol use: No    Alcohol/week: 0.0 standard drinks  . Drug use: No  . Sexual activity: Not Currently  Lifestyle  . Physical activity:    Days per week: 2 days    Minutes per session: 60 min  . Stress: Not at all  Relationships  . Social connections:    Talks on phone: Patient refused    Gets together: Patient refused    Attends religious service: Patient refused    Active member of club or organization: Patient refused    Attends meetings of clubs or organizations: Patient refused    Relationship status: Patient refused  . Intimate partner violence:    Fear of current or ex partner: No    Emotionally abused: No    Physically abused: No    Forced sexual activity: No  Other Topics Concern  . Not on file  Social History  Narrative  . Not on file     Family History  Problem Relation Age of Onset  . Pulmonary embolism Mother   . Healthy Father      Current Facility-Administered Medications:  .  0.9 %  sodium chloride infusion, , Intravenous, Continuous, Lance Coon, MD, Last Rate: 50 mL/hr at 01/16/18 0300 .  acetaminophen (TYLENOL) tablet 650 mg, 650 mg, Oral, Q6H PRN **OR** acetaminophen (TYLENOL) suppository 650 mg, 650 mg, Rectal, Q6H PRN, Lance Coon, MD .  amLODipine (NORVASC) tablet 5 mg, 5 mg, Oral, Daily, Lance Coon, MD, 5 mg at 01/16/18 0909 .  [START ON 01/17/2018] Chlorhexidine Gluconate Cloth 2 % PADS 6 each, 6 each, Topical, Q0600, Pabon, Diego F, MD .  enoxaparin (LOVENOX) injection 40 mg, 40 mg, Subcutaneous, Q24H, Lance Coon, MD, 40 mg at 01/16/18 0110 .  [START ON 01/17/2018] ertapenem (INVANZ) 1,000 mg in  sodium chloride 0.9 % 100 mL IVPB, 1 g, Intravenous, Once, Pabon, Diego F, MD .  hydrALAZINE (APRESOLINE) injection 10 mg, 10 mg, Intravenous, Q6H PRN, Sudini, Srikar, MD .  HYDROmorphone (DILAUDID) injection 0.5 mg, 0.5 mg, Intravenous, Q3H PRN, Lance Coon, MD .  ondansetron (ZOFRAN) tablet 4 mg, 4 mg, Oral, Q6H PRN **OR** ondansetron (ZOFRAN) injection 4 mg, 4 mg, Intravenous, Q6H PRN, Lance Coon, MD .  oxyCODONE (Oxy IR/ROXICODONE) immediate release tablet 5 mg, 5 mg, Oral, Q4H PRN, Lance Coon, MD, 5 mg at 01/16/18 0110 .  pantoprazole (PROTONIX) EC tablet 40 mg, 40 mg, Oral, Daily, Lance Coon, MD, 40 mg at 01/16/18 0909 .  potassium chloride 10 mEq in 100 mL IVPB, 10 mEq, Intravenous, Q4H, Sudini, Srikar, MD, Last Rate: 25 mL/hr at 01/16/18 1313, 10 mEq at 01/16/18 1313 .  potassium chloride SA (K-DUR,KLOR-CON) CR tablet 40 mEq, 40 mEq, Oral, Once, Jules Husbands, MD   Physical exam:  Vitals:   01/15/18 2340 01/16/18 0000 01/16/18 0048 01/16/18 1238  BP: 131/73 (!) 144/97 (!) 161/85 125/77  Pulse: 71 61 66 66  Resp: 16  14 20   Temp:   97.6 F (36.4 C) 98.2 F (36.8 C)  TempSrc:   Oral Oral  SpO2: 98% 95% 95% 95%  Weight:   198 lb (89.8 kg)   Height:   5\' 5"  (1.651 m)    Physical Exam  Constitutional: She is oriented to person, place, and time. She appears well-developed and well-nourished.  HENT:  Head: Normocephalic and atraumatic.  Eyes: Pupils are equal, round, and reactive to light. EOM are normal.  Neck: Normal range of motion.  Cardiovascular: Normal rate, regular rhythm and normal heart sounds.  Pulmonary/Chest: Effort normal and breath sounds normal.  Abdominal: Soft. Bowel sounds are normal.  Neurological: She is alert and oriented to person, place, and time.  Skin: Skin is warm and dry.       CMP Latest Ref Rng & Units 01/16/2018  Glucose 70 - 99 mg/dL 94  BUN 8 - 23 mg/dL 14  Creatinine 0.44 - 1.00 mg/dL 0.85  Sodium 135 - 145 mmol/L 139    Potassium 3.5 - 5.1 mmol/L 2.7(LL)  Chloride 98 - 111 mmol/L 99  CO2 22 - 32 mmol/L 31  Calcium 8.9 - 10.3 mg/dL 9.3  Total Protein 6.5 - 8.1 g/dL -  Total Bilirubin 0.3 - 1.2 mg/dL -  Alkaline Phos 38 - 126 U/L -  AST 15 - 41 U/L -  ALT 0 - 44 U/L -   CBC Latest Ref Rng & Units 01/16/2018  WBC 4.0 - 10.5 K/uL 6.2  Hemoglobin 12.0 - 15.0 g/dL 12.2  Hematocrit 36.0 - 46.0 % 36.6  Platelets 150 - 400 K/uL 154    @IMAGES @  Mr Abdomen W Wo Contrast  Result Date: 01/16/2018 CLINICAL DATA:  Pancreatic adenocarcinoma. EXAM: MRI ABDOMEN WITHOUT AND WITH CONTRAST TECHNIQUE: Multiplanar multisequence MR imaging of the abdomen was performed both before and after the administration of intravenous contrast. CONTRAST:  9 cc Gadavist. COMPARISON:  01/15/2018 FINDINGS: Lower chest: No pleural effusion identified. Hepatobiliary: Hepatic steatosis. The liver has a diffusely irregular contour with relative hypertrophy of the lateral segment and caudate lobe of liver. Imaging findings compatible with cirrhosis. No hyperenhancing liver lesions identified. No abnormal areas of washout identified Pancreas: There is no main duct dilatation or pancreatic inflammation identified. There are several cystic lesions identified within the head of pancreas these appear unilocular without internal septation or nodularity. The largest measures 1.8 cm, image 22/4. Several smaller T2 hyperintense cystic foci are noted within the body and tail of pancreas measuring up to 5 mm. No solid enhancing pancreatic mass lesion identified. Spleen: The spleen measures 11.7 cm in cranial caudal dimension. No focal splenic abnormality. Adrenals/Urinary Tract: Normal appearance of the adrenal glands. Bilateral renal cortical scarring is identified right greater than left. T1 hyperintense lesion arising from the posterior cortex of the left kidney measures 1.2 cm, image 44/17. There is no internal enhancement identified within this structure.  Scattered T2 hyperintense lesions within both kidneys are again noted. These measure less than 1 cm and are too small to reliably characterize. No solid enhancing mass or hydronephrosis noted. Stomach/Bowel: The stomach appears unremarkable. There is a duodenal diverticula within the left upper quadrant of the abdomen measuring 3.6 cm, image 21/3. The right lower quadrant small bowel mass is better imaged on CT from 01/15/2018. Vascular/Lymphatic: Aortic atherosclerosis. No aneurysm. No retroperitoneal adenopathy. Right lower quadrant soft tissue mass is better seen on CT from 01/15/2018. Other:  No ascites. Musculoskeletal: No suspicious bone lesions identified. IMPRESSION: 1. Cystic lesions within the head body and tail of pancreas are again noted. Findings are favored to represent either pancreatic pseudocyst, or cystic neoplasm of the pancreas. Follow-up imaging in 12 months with repeat MRI of the pancreas is advised. 2. Cirrhosis of the liver.  No focal liver lesions noted. 3. Right lower quadrant lesions identified on recent CT are better seen on 05/15/2017. 4. Duodenal diverticula Electronically Signed   By: Kerby Moors M.D.   On: 01/16/2018 15:52   Ct Abdomen Pelvis W Contrast  Result Date: 01/15/2018 CLINICAL DATA:  Epigastric pain starting last night. Nausea. Prior cholecystectomy and hysterectomy. EXAM: CT ABDOMEN AND PELVIS WITH CONTRAST TECHNIQUE: Multidetector CT imaging of the abdomen and pelvis was performed using the standard protocol following bolus administration of intravenous contrast. CONTRAST:  29mL OMNIPAQUE IOHEXOL 300 MG/ML SOLN, 1103mL OMNIPAQUE IOHEXOL 300 MG/ML SOLN COMPARISON:  None. FINDINGS: Lower chest: Coronary artery calcification is evident. Hepatobiliary: Nodular hepatic contour is consistent with cirrhosis. Gallbladder surgically absent. No intrahepatic or extrahepatic biliary dilation. Pancreas: 17 x 11 mm cystic lesion identified in the head of the pancreas (33/2).  Adjacent 12 mm pancreatic head cyst identified on image 31/series 2. No dilatation of the main pancreatic duct. Spleen: No splenomegaly. No focal mass lesion. Adrenals/Urinary Tract: No adrenal nodule or mass. Cortical scarring noted in the kidneys bilaterally, right greater than left with tiny bilateral nonobstructing renal stones. 14 mm exophytic lesion in the upper pole of the left  kidney has attenuation higher than would be expected for a simple cyst. 9 mm low-density lesion in the upper pole the right kidney is too small to characterize. No evidence for hydroureter. The urinary bladder appears normal for the degree of distention. Stomach/Bowel: Stomach is nondistended. No gastric wall thickening. No evidence of outlet obstruction. Duodenum is normally positioned as is the ligament of Treitz. No small bowel dilatation. There is a segment of small bowel in the right lower quadrant (69/2) head appears mildly distended and shows some wall thickening. Just cranial to and contiguous with this abnormal small bowel segment is a 3.3 x 2.1 x 3.9 cm necrotic appearing soft tissue lesion, potentially a lymph node. This is well demonstrated on coronal image 43 of series 5. Other adjacent smaller lymph nodes are evident. The terminal ileum is normal. The appendix is not visualized, but there is no edema or inflammation in the region of the cecum. Diverticuli are seen scattered along the entire length of the colon without CT findings of diverticulitis. Vascular/Lymphatic: There is abdominal aortic atherosclerosis without aneurysm. No gastrohepatic or hepatoduodenal ligament lymphadenopathy. No retroperitoneal lymphadenopathy. No pelvic sidewall lymphadenopathy. Reproductive: Uterus surgically absent.  There is no adnexal mass. Other: No intraperitoneal free fluid. Musculoskeletal: No worrisome lytic or sclerotic osseous abnormality. IMPRESSION: 1. Abnormal small bowel loop in the right lower quadrant with an adjacent 4 cm  necrotic mesenteric lesion, potentially a lymph node. Other smaller lymph nodes are seen in the adjacent mesentery. There is no associated edema or inflammation to suggest that this represents inflammatory bowel disease. Imaging features are not characteristic for diverticulum. As such, small-bowel neoplasm, including carcinoid is a consideration. Lymphoma also possible. 2. Adjacent small hypoattenuating lesions in the pancreatic head appears cystic. These do not have classic appearance for adenocarcinoma and there is no associated ductal dilatation. However given the findings described immediately above, MRI of the pancreas without with contrast suggested to further evaluate. 3. Nodular hepatic contour is compatible with cirrhosis. 4. Bilateral renal cortical scarring with bilateral tiny nonobstructing renal stones. 14 mm exophytic lesion upper pole left kidney cannot be definitively characterized. If abdominal MRIs performed, attention to the left kidney recommended. Otherwise surveillance may be warranted. Electronically Signed   By: Misty Stanley M.D.   On: 01/15/2018 20:36    Assessment and plan- Patient is a 84 y.o. female presenting with small bowel mass carcinoid versus adenocarcinoma versus lymphoma as well as pancreatic cyst  1.  Patient has seen Dr. Dahlia Byes who will be proceeding with exploratory laparotomy and segmental resection if possible of the small bowel tumor.  I will see the patient 10 days post discharge to discuss the results of final pathology and further management  2.  With regards to the pancreatic cyst patient had an MRI with and without contrast which shows multiple cystic lesions in the pancreas which are not overtly concerning for malignancy.  Pseudocyst versus IPMN or other possibilities.  I will repeat another MRI in 1 years time but also discussed the case at tumor board if EUS would be warranted down the line after acute small bowel issues are taken care of  3.  Patient has  baseline cirrhosis likely secondary to nonalcoholic fatty liver disease and follows up with [facility]   Thank you for this kind referral and the opportunity to participate in the care of this patient   Visit Diagnosis 1. Epigastric pain   2. Pancreatic cyst   3. Kidney cysts  Dr. Randa Evens, MD, MPH [facility] at [facility] [phone] 01/16/2018

## 2018-01-16 NOTE — Op Note (Addendum)
Patient ID: Kaitlyn Oliver, female   DOB: 03-03-1934, 84 y.o.   MRN: [id number]  HPI Kaitlyn Oliver is a 84 y.o. female asked to see in consultation by Dr. Elpidio Anis.  Case discussed with Dr. Elpidio Anis and Dr. Smith Robert in detail.  She comes to the emergency room yesterday complaining of epigastric pain and nausea.  No vomiting.  She was able to take some food.  No fevers no chills.  Pain is sharp intermittent no specific alleviating or aggravating factors.  Of note the patient had a previous episode of bowel obstruction a couple months ago that was managed conservatively.  I have personally review her CT scans from 2 months ago and compared to that was done yesterday.  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  This was not as evident on previous CT scan from 2 months ago.  There is no evidence of pneumatosis there is no evidence of free air there is no evidence of perforation or bowel obstruction.  There is evidence of some liver cirrhosis that is mild.  MRI also personally review showing evidence of some pancreatic pseudocyst and benign lesions.  No other acute findings. She was recently diagnosed with cirrhosis but is a child is a classification.  Her albumin is normal her LFTs are very slightly elevated b her bilirubin is normal her CBC is completely normal except mild thrombocytopenia PL 123k.  There is no ascites on either MRI or CT.  INR few months ago was normal. Denies any clinical bleeding.  She is able to perform more than 4 METS of activity without any shortness or chest pain.  She is independent and very functional 84 year old   HPI  Past Medical History:  Diagnosis Date  . Anxiety   . Arthritis    thumbs, hands  . Cancer [facility]) 1991   bilateral breast  . Depression   . GERD (gastroesophageal reflux disease)   . Headache    occasional - pinched nerve in neck  . Hypertension   . Hypothyroidism   . Thyroid disease   . Wears dentures    full upper,  partial lower    Past Surgical History:  Procedure Laterality Date  . BREAST SURGERY Bilateral 1991   mastectomy  . CATARACT EXTRACTION Bilateral   . CHOLECYSTECTOMY    . COLONOSCOPY  2013   Dr Marva Panda  . HALLUX VALGUS AUSTIN Right 06/03/2015   Procedure: HALLUX VALGUS AUSTIN;  Surgeon: Gwyneth Revels, DPM;  Location: [facility];  Service: Podiatry;  Laterality: Right;  WITH POPLITEAL  . KNEE ARTHROSCOPY WITH MEDIAL MENISECTOMY Right 08/30/2016   Procedure: KNEE ARTHROSCOPY WITH PARTIAL MEDIAL MENISECTOMY and Partial Lateral Menisectomy;  Surgeon: Kennedy Bucker, MD;  Location: [facility];  Service: Orthopedics;  Laterality: Right;  . KNEE ARTHROSCOPY WITH SUBCHONDROPLASTY Right 08/30/2016   Procedure: KNEE ARTHROSCOPY WITH SUBCHONDROPLASTY;  Surgeon: Kennedy Bucker, MD;  Location: [facility];  Service: Orthopedics;  Laterality: Right;  . MASTECTOMY Bilateral   . ROTATOR CUFF REPAIR Bilateral   . TONSILLECTOMY    . TOTAL KNEE ARTHROPLASTY Left   . TOTAL KNEE ARTHROPLASTY Right 05/30/2017   Procedure: TOTAL KNEE ARTHROPLASTY;  Surgeon: Kennedy Bucker, MD;  Location: [facility];  Service: Orthopedics;  Laterality: Right;  Marland Kitchen VAGINAL HYSTERECTOMY      Family History  Problem Relation Age of Onset  . Pulmonary embolism Mother   . Healthy Father     Social History Social History   Tobacco  Use  . Smoking status: Former Smoker    Packs/day: 0.25    Years: 10.00    Pack years: 2.50    Types: Cigarettes    Last attempt to quit: 1991    Years since quitting: 28.8  . Smokeless tobacco: Never Used  . Tobacco comment: smoking cessation materials not required  Substance Use Topics  . Alcohol use: No    Alcohol/week: 0.0 standard drinks  . Drug use: No    Allergies  Allergen Reactions  . Adhesive [Tape] Dermatitis    Can not use plastic tape. Paper tape ok   . Penicillin G Itching    Has patient had a PCN reaction causing immediate rash, facial/tongue/throat swelling, SOB or  lightheadedness with hypotension: No Has patient had a PCN reaction causing severe rash involving mucus membranes or skin necrosis: No Has patient had a PCN reaction that required hospitalization: No Has patient had a PCN reaction occurring within the last 10 years: No If all of the above answers are "NO", then may proceed with Cephalosporin use.   Marland Kitchen Hydrocodone-Acetaminophen Itching and Rash    Current Facility-Administered Medications  Medication Dose Route Frequency Provider Last Rate Last Dose  . 0.9 %  sodium chloride infusion   Intravenous Continuous Oralia Manis, MD 50 mL/hr at 01/16/18 0300    . acetaminophen (TYLENOL) tablet 650 mg  650 mg Oral Q6H PRN Oralia Manis, MD       Or  . acetaminophen (TYLENOL) suppository 650 mg  650 mg Rectal Q6H PRN Oralia Manis, MD      . amLODipine (NORVASC) tablet 5 mg  5 mg Oral Daily Oralia Manis, MD   5 mg at 01/16/18 0909  . [START ON 01/17/2018] Chlorhexidine Gluconate Cloth 2 % PADS 6 each  6 each Topical Q0600 Montie Gelardi F, MD      . enoxaparin (LOVENOX) injection 40 mg  40 mg Subcutaneous Q24H Oralia Manis, MD   40 mg at 01/16/18 0110  . ertapenem (INVANZ) 1,000 mg in sodium chloride 0.9 % 100 mL IVPB  1 g Intravenous Once Preet Mangano, [place] F, MD      . hydrALAZINE (APRESOLINE) injection 10 mg  10 mg Intravenous Q6H PRN Sudini, Wardell Heath, MD      . HYDROmorphone (DILAUDID) injection 0.5 mg  0.5 mg Intravenous Q3H PRN Oralia Manis, MD      . ondansetron [facility]) tablet 4 mg  4 mg Oral Q6H PRN Oralia Manis, MD       Or  . ondansetron [facility]) injection 4 mg  4 mg Intravenous Q6H PRN Oralia Manis, MD      . oxyCODONE (Oxy IR/ROXICODONE) immediate release tablet 5 mg  5 mg Oral Q4H PRN Oralia Manis, MD   5 mg at 01/16/18 0110  . pantoprazole (PROTONIX) EC tablet 40 mg  40 mg Oral Daily Oralia Manis, MD   40 mg at 01/16/18 0909  . potassium chloride 10 mEq in 100 mL IVPB  10 mEq Intravenous Q4H Milagros Loll, MD 25 mL/hr at 01/16/18 1313 10  mEq at 01/16/18 1313  . potassium chloride SA (K-DUR,KLOR-CON) CR tablet 40 mEq  40 mEq Oral Once Kamuela Magos, Merri Ray, MD         Review of Systems Full ROS  was asked and was negative except for the information on the HPI  Physical Exam Blood pressure 125/77, pulse 66, temperature 98.2 F (36.8 C), temperature source Oral, resp. rate 20, height 5\' 5"  (1.651 m), weight 89.8 kg,  SpO2 95 %. CONSTITUTIONAL: NAD EYES: Pupils are equal, round, and reactive to light, Sclera are non-icteric. EARS, NOSE, MOUTH AND THROAT: The oropharynx is clear. The oral mucosa is pink and moist. Hearing is intact to voice. LYMPH NODES:  Lymph nodes in the neck are normal. RESPIRATORY:  Lungs are clear. There is normal respiratory effort, with equal breath sounds bilaterally, and without pathologic use of accessory muscles. CARDIOVASCULAR: Heart is regular without murmurs, gallops, or rubs. GI: The abdomen is soft, nontender, and nondistended. There are no palpable masses. There is no hepatosplenomegaly. There are normal bowel sounds in all quadrants.  Previous Pfannenstiel and gallbladder scars GU: Rectal deferred.   MUSCULOSKELETAL: Normal muscle strength and tone. No cyanosis or edema.   SKIN: Turgor is good and there are no pathologic skin lesions or ulcers. NEUROLOGIC: Motor and sensation is grossly normal. Cranial nerves are grossly intact. PSYCH:  Oriented to person, place and time. Affect is normal.  Data Reviewed  I have personally reviewed the patient's imaging, laboratory findings and medical records.    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  Had a lengthy discussion with patient the family.  She is very functional and I do think that will benefit from exploratory laparotomy and segmental resection if possible.  He had a previous evidence of bowel obstruction that  was likely related to the small bowel tumor.  Definitely indication for surgical intervention at this time.  Procedure discussed with the patient detail.  Risk benefit and possible complications including but not limited to: Bleeding, infection, the chance of not being able to perform a segmental resection due to advanced cancer or carcinomatosis.  Recurrence of tumor and hernias.  She understands and wishes to proceed.  We will tentatively place her on the schedule tomorrow for laparotomy and small bowel resection.  All questions were answered Please note this is a consultation note and not an op note which was selected in error  Sterling Big, MD FACS General Surgeon 01/16/2018, 4:16 PM

## 2018-01-17 ENCOUNTER — Inpatient Hospital Stay: Payer: Medicare HMO | Admitting: Anesthesiology

## 2018-01-17 ENCOUNTER — Other Ambulatory Visit: Payer: Self-pay | Admitting: Oncology

## 2018-01-17 ENCOUNTER — Encounter
Admission: EM | Disposition: A | Discharge status: Home Health | Payer: Self-pay | Source: Home / Self Care | Attending: Internal Medicine

## 2018-01-17 ENCOUNTER — Encounter: Payer: Self-pay | Admitting: Anesthesiology

## 2018-01-17 HISTORY — PX: BOWEL RESECTION: SHX1257

## 2018-01-17 HISTORY — PX: LAPAROTOMY: SHX154

## 2018-01-17 LAB — COMPREHENSIVE METABOLIC PANEL
ALK PHOS: 58 U/L (ref 38–126)
ALT: 30 U/L (ref 0–44)
AST: 27 U/L (ref 15–41)
Albumin: 3.8 g/dL (ref 3.5–5.0)
Anion gap: 10 (ref 5–15)
BILIRUBIN TOTAL: 1.4 mg/dL — AB (ref 0.3–1.2)
BUN: 12 mg/dL (ref 8–23)
CALCIUM: 9.2 mg/dL (ref 8.9–10.3)
CO2: 27 mmol/L (ref 22–32)
CREATININE: 0.95 mg/dL (ref 0.44–1.00)
Chloride: 100 mmol/L (ref 98–111)
GFR calc non Af Amer: 54 mL/min — ABNORMAL LOW (ref >60)
Glucose, Bld: 112 mg/dL — ABNORMAL HIGH (ref 70–99)
Potassium: 3.1 mmol/L — ABNORMAL LOW (ref 3.5–5.1)
Sodium: 137 mmol/L (ref 135–145)
TOTAL PROTEIN: 6.9 g/dL (ref 6.5–8.1)

## 2018-01-17 LAB — CBC
HCT: 36.2 % (ref 36.0–46.0)
Hemoglobin: 12.1 g/dL (ref 12.0–15.0)
MCH: 29.5 pg (ref 26.0–34.0)
MCHC: 33.4 g/dL (ref 30.0–36.0)
MCV: 88.3 fL (ref 80.0–100.0)
NRBC: 0 % (ref 0.0–0.2)
Platelets: 139 10*3/uL — ABNORMAL LOW (ref 150–400)
RBC: 4.1 MIL/uL (ref 3.87–5.11)
RDW: 14.5 % (ref 11.5–15.5)
WBC: 7.4 10*3/uL (ref 4.0–10.5)

## 2018-01-17 LAB — PROTIME-INR
INR: 1.07
Prothrombin Time: 13.8 seconds (ref 11.4–15.2)

## 2018-01-17 LAB — TYPE AND SCREEN
ABO/RH(D): A NEG
Antibody Screen: NEGATIVE

## 2018-01-17 LAB — CEA: CEA: 2.9 ng/mL (ref 0.0–4.7)

## 2018-01-17 SURGERY — LAPAROTOMY, EXPLORATORY
Anesthesia: General

## 2018-01-17 MED ORDER — SUGAMMADEX SODIUM 200 MG/2ML IV SOLN
INTRAVENOUS | Status: DC | PRN
  Administered 2018-01-17: 200 mg via INTRAVENOUS

## 2018-01-17 MED ORDER — ACETAMINOPHEN 10 MG/ML IV SOLN
INTRAVENOUS | Status: AC
  Filled 2018-01-17: qty 100

## 2018-01-17 MED ORDER — SUCCINYLCHOLINE CHLORIDE 20 MG/ML IJ SOLN
INTRAMUSCULAR | Status: DC | PRN
  Administered 2018-01-17: 100 mg via INTRAVENOUS

## 2018-01-17 MED ORDER — PROMETHAZINE HCL 25 MG/ML IJ SOLN
INTRAMUSCULAR | Status: AC
  Administered 2018-01-17: 6.25 mg via INTRAVENOUS
  Filled 2018-01-17: qty 1

## 2018-01-17 MED ORDER — LIDOCAINE HCL (CARDIAC) PF 100 MG/5ML IV SOSY
PREFILLED_SYRINGE | INTRAVENOUS | Status: DC | PRN
  Administered 2018-01-17: 60 mg via INTRAVENOUS

## 2018-01-17 MED ORDER — ROCURONIUM BROMIDE 50 MG/5ML IV SOLN
INTRAVENOUS | Status: AC
  Filled 2018-01-17: qty 1

## 2018-01-17 MED ORDER — SODIUM CHLORIDE 0.9 % IV SOLN
INTRAVENOUS | Status: DC | PRN
  Administered 2018-01-17: 70 mL

## 2018-01-17 MED ORDER — PROPOFOL 10 MG/ML IV BOLUS
INTRAVENOUS | Status: AC
  Filled 2018-01-17: qty 20

## 2018-01-17 MED ORDER — BUPIVACAINE-EPINEPHRINE (PF) 0.25% -1:200000 IJ SOLN
INTRAMUSCULAR | Status: DC | PRN
  Administered 2018-01-17: 30 mL

## 2018-01-17 MED ORDER — OXYCODONE HCL 5 MG PO TABS
5.0000 mg | ORAL_TABLET | ORAL | Status: DC | PRN
  Administered 2018-01-17 – 2018-01-18 (×2): 5 mg via ORAL
  Filled 2018-01-17 (×2): qty 1

## 2018-01-17 MED ORDER — ONDANSETRON HCL 4 MG/2ML IJ SOLN
INTRAMUSCULAR | Status: AC
  Filled 2018-01-17: qty 2

## 2018-01-17 MED ORDER — ACETAMINOPHEN 10 MG/ML IV SOLN
INTRAVENOUS | Status: DC | PRN
  Administered 2018-01-17: 1000 mg via INTRAVENOUS

## 2018-01-17 MED ORDER — MORPHINE SULFATE (PF) 4 MG/ML IV SOLN: 2.0000 mg | INTRAVENOUS | Status: DC | PRN

## 2018-01-17 MED ORDER — DEXAMETHASONE SODIUM PHOSPHATE 10 MG/ML IJ SOLN
INTRAMUSCULAR | Status: AC
  Filled 2018-01-17: qty 1

## 2018-01-17 MED ORDER — KETOROLAC TROMETHAMINE 15 MG/ML IJ SOLN
15.0000 mg | Freq: Four times a day (QID) | INTRAMUSCULAR | Status: DC
  Administered 2018-01-18 – 2018-01-19 (×7): 15 mg via INTRAVENOUS
  Filled 2018-01-17 (×9): qty 1

## 2018-01-17 MED ORDER — FENTANYL CITRATE (PF) 100 MCG/2ML IJ SOLN
25.0000 ug | INTRAMUSCULAR | Status: DC | PRN
  Administered 2018-01-17 (×2): 25 ug via INTRAVENOUS

## 2018-01-17 MED ORDER — HYDROMORPHONE HCL 1 MG/ML IJ SOLN: 0.5000 mg | INTRAMUSCULAR | Status: DC | PRN

## 2018-01-17 MED ORDER — ROCURONIUM BROMIDE 100 MG/10ML IV SOLN
INTRAVENOUS | Status: DC | PRN
  Administered 2018-01-17: 10 mg via INTRAVENOUS
  Administered 2018-01-17: 5 mg via INTRAVENOUS
  Administered 2018-01-17: 25 mg via INTRAVENOUS

## 2018-01-17 MED ORDER — LACTATED RINGERS IV SOLN
INTRAVENOUS | Status: DC | PRN
  Administered 2018-01-17: 14:00:00 via INTRAVENOUS

## 2018-01-17 MED ORDER — SODIUM CHLORIDE (PF) 0.9 % IJ SOLN
INTRAMUSCULAR | Status: AC
  Filled 2018-01-17: qty 50

## 2018-01-17 MED ORDER — ONDANSETRON HCL 4 MG/2ML IJ SOLN
INTRAMUSCULAR | Status: DC | PRN
  Administered 2018-01-17: 4 mg via INTRAVENOUS

## 2018-01-17 MED ORDER — PROPOFOL 10 MG/ML IV BOLUS
INTRAVENOUS | Status: DC | PRN
  Administered 2018-01-17: 130 mg via INTRAVENOUS

## 2018-01-17 MED ORDER — DEXAMETHASONE SODIUM PHOSPHATE 10 MG/ML IJ SOLN
INTRAMUSCULAR | Status: DC | PRN
  Administered 2018-01-17: 6 mg via INTRAVENOUS

## 2018-01-17 MED ORDER — SEVOFLURANE IN SOLN
RESPIRATORY_TRACT | Status: AC
  Filled 2018-01-17: qty 250

## 2018-01-17 MED ORDER — SODIUM CHLORIDE FLUSH 0.9 % IV SOLN
INTRAVENOUS | Status: AC
  Filled 2018-01-17: qty 10

## 2018-01-17 MED ORDER — PROMETHAZINE HCL 25 MG/ML IJ SOLN
6.2500 mg | Freq: Once | INTRAMUSCULAR | Status: AC
  Administered 2018-01-17: 6.25 mg via INTRAVENOUS

## 2018-01-17 MED ORDER — FENTANYL CITRATE (PF) 100 MCG/2ML IJ SOLN
INTRAMUSCULAR | Status: AC
  Filled 2018-01-17: qty 2

## 2018-01-17 MED ORDER — PHENYLEPHRINE HCL 10 MG/ML IJ SOLN
INTRAMUSCULAR | Status: DC | PRN
  Administered 2018-01-17: 100 ug via INTRAVENOUS

## 2018-01-17 MED ORDER — HYDROMORPHONE HCL 1 MG/ML IJ SOLN
INTRAMUSCULAR | Status: AC
  Filled 2018-01-17: qty 1

## 2018-01-17 MED ORDER — FENTANYL CITRATE (PF) 100 MCG/2ML IJ SOLN
INTRAMUSCULAR | Status: DC | PRN
  Administered 2018-01-17 (×2): 50 ug via INTRAVENOUS

## 2018-01-17 MED ORDER — FENTANYL CITRATE (PF) 100 MCG/2ML IJ SOLN
INTRAMUSCULAR | Status: AC
  Administered 2018-01-17: 25 ug via INTRAVENOUS
  Filled 2018-01-17: qty 2

## 2018-01-17 MED ORDER — POTASSIUM CHLORIDE 10 MEQ/100ML IV SOLN
10.0000 meq | INTRAVENOUS | Status: DC
  Administered 2018-01-17: 09:00:00 10 meq via INTRAVENOUS
  Filled 2018-01-17: qty 100

## 2018-01-17 MED ORDER — BUPIVACAINE LIPOSOME 1.3 % IJ SUSP
INTRAMUSCULAR | Status: AC
  Filled 2018-01-17: qty 20

## 2018-01-17 SURGICAL SUPPLY — 54 items
APPLIER CLIP 11 MED OPEN (CLIP)
APPLIER CLIP 13 LRG OPEN (CLIP)
APR CLP LRG 13 20 CLIP (CLIP)
APR CLP MED 11 20 MLT OPN (CLIP)
BARRIER ADH SEPRAFILM 3INX5IN (MISCELLANEOUS) IMPLANT
BLADE CLIPPER SURG (BLADE) ×3 IMPLANT
BLADE SURG SZ10 CARB STEEL (BLADE) ×3 IMPLANT
BRR ADH 5X3 SEPRAFILM 2 SHT (MISCELLANEOUS)
CANISTER SUCT 3000ML PPV (MISCELLANEOUS) ×3 IMPLANT
CHLORAPREP W/TINT 26ML (MISCELLANEOUS) ×3 IMPLANT
CLIP APPLIE 11 MED OPEN (CLIP) ×2 IMPLANT
CLIP APPLIE 13 LRG OPEN (CLIP) ×2 IMPLANT
COVER WAND RF STERILE (DRAPES) ×2 IMPLANT
DRAPE LAPAROTOMY 100X77 ABD (DRAPES) ×3 IMPLANT
DRAPE TABLE BACK 80X90 (DRAPES) ×3 IMPLANT
DRSG OPSITE POSTOP 4X10 (GAUZE/BANDAGES/DRESSINGS) ×1 IMPLANT
DRSG TEGADERM 2-3/8X2-3/4 SM (GAUZE/BANDAGES/DRESSINGS) ×6 IMPLANT
DRSG TELFA 3X8 NADH (GAUZE/BANDAGES/DRESSINGS) ×3 IMPLANT
ELECT BLADE 6.5 EXT (BLADE) ×3 IMPLANT
ELECT REM PT RETURN 9FT ADLT (ELECTROSURGICAL) ×3
ELECTRODE REM PT RTRN 9FT ADLT (ELECTROSURGICAL) ×2 IMPLANT
GAUZE SPONGE 4X4 12PLY STRL (GAUZE/BANDAGES/DRESSINGS) ×6 IMPLANT
GLOVE BIO SURGEON STRL SZ7 (GLOVE) ×3 IMPLANT
GOWN STRL REUS W/ TWL LRG LVL3 (GOWN DISPOSABLE) ×4 IMPLANT
GOWN STRL REUS W/TWL LRG LVL3 (GOWN DISPOSABLE) ×6
HANDLE SUCTION POOLE (INSTRUMENTS) ×2 IMPLANT
HANDLE YANKAUER SUCT BULB TIP (MISCELLANEOUS) ×3 IMPLANT
LIGASURE IMPACT 36 18CM CVD LR (INSTRUMENTS) ×1 IMPLANT
NDL HYPO 25X1 1.5 SAFETY (NEEDLE) ×2 IMPLANT
NEEDLE HYPO 22GX1.5 SAFETY (NEEDLE) ×6 IMPLANT
NEEDLE HYPO 25X1 1.5 SAFETY (NEEDLE) ×3 IMPLANT
PACK BASIN MAJOR ARMC (MISCELLANEOUS) ×3 IMPLANT
PAD DRESSING TELFA 3X8 NADH (GAUZE/BANDAGES/DRESSINGS) ×2 IMPLANT
RELOAD PROXIMATE 75MM BLUE (ENDOMECHANICALS) ×33 IMPLANT
RELOAD STAPLE 75 3.8 BLU REG (ENDOMECHANICALS) IMPLANT
SPONGE LAP 18X18 RF (DISPOSABLE) ×3 IMPLANT
SPONGE LAP 18X36 RFD (DISPOSABLE) IMPLANT
STAPLER PROXIMATE 75MM BLUE (STAPLE) ×1 IMPLANT
STAPLER SKIN PROX 35W (STAPLE) ×3 IMPLANT
SUCTION POOLE HANDLE (INSTRUMENTS) ×3
SUT PDS AB 0 CT1 27 (SUTURE) ×9 IMPLANT
SUT SILK 2 0 (SUTURE) ×3
SUT SILK 2 0 SH CR/8 (SUTURE) ×3 IMPLANT
SUT SILK 2 0SH CR/8 30 (SUTURE) ×3 IMPLANT
SUT SILK 2-0 18XBRD TIE 12 (SUTURE) ×2 IMPLANT
SUT VIC AB 0 CT1 36 (SUTURE) ×6 IMPLANT
SUT VIC AB 2-0 SH 27 (SUTURE) ×12
SUT VIC AB 2-0 SH 27XBRD (SUTURE) ×4 IMPLANT
SUT VIC AB 3-0 SH 27 (SUTURE) ×3
SUT VIC AB 3-0 SH 27X BRD (SUTURE) IMPLANT
SYR 30ML LL (SYRINGE) ×8 IMPLANT
SYR 3ML LL SCALE MARK (SYRINGE) ×3 IMPLANT
TAPE MICROFOAM 4IN (TAPE) ×3 IMPLANT
TRAY FOLEY MTR SLVR 16FR STAT (SET/KITS/TRAYS/PACK) ×3 IMPLANT

## 2018-01-17 NOTE — Anesthesia Post-op Follow-up Note (Signed)
Anesthesia QCDR form completed.        

## 2018-01-17 NOTE — Anesthesia Postprocedure Evaluation (Signed)
Anesthesia Post Note  Patient: Raiana Venia Carbon  Procedure(s) Performed: EXPLORATORY LAPAROTOMY (N/A ) SMALL BOWEL RESECTION  Patient location during evaluation: PACU Anesthesia Type: General Level of consciousness: awake and alert Pain management: pain level controlled Vital Signs Assessment: post-procedure vital signs reviewed and stable Respiratory status: spontaneous breathing, nonlabored ventilation, respiratory function stable and patient connected to nasal cannula oxygen Cardiovascular status: blood pressure returned to baseline and stable Postop Assessment: no apparent nausea or vomiting Anesthetic complications: no     Last Vitals:  Vitals:   01/17/18 1650 01/17/18 1654  BP: 134/69   Pulse: 69   Resp:    Temp:    SpO2:  93%    Last Pain:  Vitals:   01/17/18 1654  TempSrc:   PainSc: Asleep                 Precious Haws Donyel Nester

## 2018-01-17 NOTE — Anesthesia Preprocedure Evaluation (Addendum)
Anesthesia Evaluation  Patient identified by MRN, date of birth, ID band Patient awake    Reviewed: Allergy & Precautions, H&P , NPO status , Patient's Chart, lab work & pertinent test results  History of Anesthesia Complications Negative for: history of anesthetic complications  Airway Mallampati: III  TM Distance: >3 FB Neck ROM: full    Dental  (+) Missing, Edentulous Upper   Pulmonary neg COPD, former smoker,    breath sounds clear to auscultation       Cardiovascular hypertension, (-) Past MI, (-) Cardiac Stents and (-) CABG (-) dysrhythmias + Valvular Problems/Murmurs (murmur)  Rhythm:regular Rate:Normal + Systolic murmurs Echo 79/02/40: NORMAL LEFT VENTRICULAR SYSTOLIC FUNCTION WITH AN ESTIMATED EF = 60 % NORMAL RIGHT VENTRICULAR SYSTOLIC FUNCTION MODERATE-TO-SEVERE TRICUSPID VALVE INSUFFICIENCY MODERATE MITRAL VALVE INSUFFICIENCY TRACE AORTIC VALVE INSUFFICIENCY NO VALVULAR STENOSIS MILD RV ENLARGEMENT MILD BIATRIAL ENLARGEMENT DILATED ASCENDING AORTA MEASURING UP TO 4.1 cm  NM stress: normal   Neuro/Psych  Headaches, PSYCHIATRIC DISORDERS Anxiety Depression    GI/Hepatic Neg liver ROS, PUD, GERD  ,  Endo/Other  Hypothyroidism   Renal/GU      Musculoskeletal  (+) Arthritis ,   Abdominal   Peds  Hematology negative hematology ROS (+)   Anesthesia Other Findings Past Medical History: No date: Anxiety No date: Arthritis     Comment:  thumbs, hands 1991: Cancer ([place])     Comment:  bilateral breast No date: Depression No date: GERD (gastroesophageal reflux disease) No date: Headache     Comment:  occasional - pinched nerve in neck No date: Hypertension No date: Hypothyroidism No date: Thyroid disease No date: Wears dentures     Comment:  full upper, partial lower  Past Surgical History: 1991: BREAST SURGERY; Bilateral     Comment:  mastectomy No date: CATARACT EXTRACTION; Bilateral No date:  CHOLECYSTECTOMY 2013: COLONOSCOPY     Comment:  Dr Gustavo Lah 06/03/2015: HALLUX VALGUS AUSTIN; Right     Comment:  Procedure: [place];  Surgeon: Samara Deist, DPM;  Location: [facility];  Service:               Podiatry;  Laterality: Right;  WITH POPLITEAL 08/30/2016: KNEE ARTHROSCOPY WITH MEDIAL MENISECTOMY; Right     Comment:  Procedure: KNEE ARTHROSCOPY WITH PARTIAL MEDIAL               MENISECTOMY and Partial Lateral Menisectomy;  Surgeon:               Hessie Knows, MD;  Location: [facility];  Service:               Orthopedics;  Laterality: Right; 08/30/2016: KNEE ARTHROSCOPY WITH SUBCHONDROPLASTY; Right     Comment:  Procedure: KNEE ARTHROSCOPY WITH SUBCHONDROPLASTY;                Surgeon: Hessie Knows, MD;  Location: [facility];                Service: Orthopedics;  Laterality: Right; No date: MASTECTOMY; Bilateral No date: ROTATOR CUFF REPAIR; Bilateral No date: TONSILLECTOMY No date: TOTAL KNEE ARTHROPLASTY; Left 05/30/2017: TOTAL KNEE ARTHROPLASTY; Right     Comment:  Procedure: TOTAL KNEE ARTHROPLASTY;  Surgeon: Hessie Knows, MD;  Location: [facility];  Service: Orthopedics;  Laterality: Right; No date: VAGINAL HYSTERECTOMY  BMI    Body Mass Index:  32.95 kg/m      Reproductive/Obstetrics negative OB ROS                           Anesthesia Physical Anesthesia Plan  ASA: III  Anesthesia Plan: General ETT   Post-op Pain Management:    Induction:   PONV Risk Score and Plan: Ondansetron and Dexamethasone  Airway Management Planned:   Additional Equipment:   Intra-op Plan:   Post-operative Plan:   Informed Consent: I have reviewed the patients History and Physical, chart, labs and discussed the procedure including the risks, benefits and alternatives for the proposed anesthesia with the patient or authorized representative who has indicated his/her understanding and  acceptance.   Dental Advisory Given  Plan Discussed with: Anesthesiologist, CRNA and Surgeon  Anesthesia Plan Comments:         Anesthesia Quick Evaluation

## 2018-01-17 NOTE — Progress Notes (Signed)
Obtained consent for surgery. Pt A&O x4. Communicated procedure taking place, rationale of procedure and understanding of risk and benefits.

## 2018-01-17 NOTE — Plan of Care (Signed)
  Problem: Education: Goal: Knowledge of General Education information will improve Description Including pain rating scale, medication(s)/side effects and non-pharmacologic comfort measures Outcome: Progressing   Problem: Health Behavior/Discharge Planning: Goal: Ability to manage health-related needs will improve Outcome: Progressing   Problem: Clinical Measurements: Goal: Cardiovascular complication will be avoided Outcome: Progressing   Problem: Coping: Goal: Level of anxiety will decrease Outcome: Progressing   Problem: Elimination: Goal: Will not experience complications related to urinary retention Outcome: Progressing   Problem: Safety: Goal: Ability to remain free from injury will improve Outcome: Progressing   Problem: Skin Integrity: Goal: Risk for impaired skin integrity will decrease Outcome: Progressing

## 2018-01-17 NOTE — Progress Notes (Signed)
[facility] [place] NAME: Kaitlyn Oliver    MR#:  [id number]  DATE OF BIRTH:  1933/05/10  SUBJECTIVE:  CHIEF COMPLAINT:   Chief Complaint  Patient presents with  . Abdominal Pain   -Sent with on and off abdominal pain for the last 2 months. -CT with necrotic lymph node in her abdomen and also pancreatic cyst.  Patient going for laparotomy today.  REVIEW OF SYSTEMS:  Review of Systems  Constitutional: Positive for malaise/fatigue. Negative for chills and fever.  HENT: Negative for congestion, ear discharge, hearing loss and nosebleeds.   Eyes: Negative for blurred vision and double vision.  Respiratory: Negative for cough, shortness of breath and wheezing.   Cardiovascular: Negative for chest pain and palpitations.  Gastrointestinal: Positive for abdominal pain. Negative for constipation, diarrhea, nausea and vomiting.  Genitourinary: Negative for dysuria.  Neurological: Negative for dizziness, focal weakness, seizures, weakness and headaches.  Psychiatric/Behavioral: Negative for depression.    DRUG ALLERGIES:   Allergies  Allergen Reactions  . Adhesive [Tape] Dermatitis    Can not use plastic tape. Paper tape ok   . Penicillin G Itching    Has patient had a PCN reaction causing immediate rash, facial/tongue/throat swelling, SOB or lightheadedness with hypotension: No Has patient had a PCN reaction causing severe rash involving mucus membranes or skin necrosis: No Has patient had a PCN reaction that required hospitalization: No Has patient had a PCN reaction occurring within the last 10 years: No If all of the above answers are "NO", then may proceed with Cephalosporin use.   Marland Kitchen Hydrocodone-Acetaminophen Itching and Rash    VITALS:  Blood pressure 137/67, pulse 65, temperature (!) 97.5 F (36.4 C), temperature source Tympanic, resp. rate 17, height 5\' 5"  (1.651 m), weight 89.8 kg, SpO2 95 %.  PHYSICAL EXAMINATION:  Physical  Exam  GENERAL:  84 y.o.-year-old elderly patient lying in the bed with no acute distress.  EYES: Pupils equal, round, reactive to light and accommodation. No scleral icterus. Extraocular muscles intact.  HEENT: Head atraumatic, normocephalic. Oropharynx and nasopharynx clear.  NECK:  Supple, no jugular venous distention. No thyroid enlargement, no tenderness.  LUNGS: Normal breath sounds bilaterally, no wheezing, rales,rhonchi or crepitation. No use of accessory muscles of respiration.  Decreased bibasilar breath sounds CARDIOVASCULAR: S1, S2 normal. No rubs, or gallops.  2/6 systolic murmur is present ABDOMEN: Soft, minimal discomfort on palpation, nontender, nondistended. Bowel sounds present. No organomegaly or mass.  EXTREMITIES: No pedal edema, cyanosis, or clubbing.  NEUROLOGIC: Cranial nerves II through XII are intact. Muscle strength 5/5 in all extremities. Sensation intact. Gait not checked.  PSYCHIATRIC: The patient is alert and oriented x 3.  SKIN: No obvious rash, lesion, or ulcer.    LABORATORY PANEL:   CBC Recent Labs  Lab 01/17/18 0603  WBC 7.4  HGB 12.1  HCT 36.2  PLT 139*   ------------------------------------------------------------------------------------------------------------------  Chemistries  Recent Labs  Lab 01/16/18 0444 01/17/18 0603  NA 139 137  K 2.7* 3.1*  CL 99 100  CO2 31 27  GLUCOSE 94 112*  BUN 14 12  CREATININE 0.85 0.95  CALCIUM 9.3 9.2  MG 1.6*  --   AST  --  27  ALT  --  30  ALKPHOS  --  58  BILITOT  --  1.4*   ------------------------------------------------------------------------------------------------------------------  Cardiac Enzymes No results for input(s): TROPONINI in the last 168 hours. ------------------------------------------------------------------------------------------------------------------  RADIOLOGY:  Mr Abdomen W Wo Contrast  Result Date: 01/16/2018 CLINICAL DATA:  Pancreatic adenocarcinoma. EXAM:  MRI ABDOMEN WITHOUT AND WITH CONTRAST TECHNIQUE: Multiplanar multisequence MR imaging of the abdomen was performed both before and after the administration of intravenous contrast. CONTRAST:  9 cc Gadavist. COMPARISON:  01/15/2018 FINDINGS: Lower chest: No pleural effusion identified. Hepatobiliary: Hepatic steatosis. The liver has a diffusely irregular contour with relative hypertrophy of the lateral segment and caudate lobe of liver. Imaging findings compatible with cirrhosis. No hyperenhancing liver lesions identified. No abnormal areas of washout identified Pancreas: There is no main duct dilatation or pancreatic inflammation identified. There are several cystic lesions identified within the head of pancreas these appear unilocular without internal septation or nodularity. The largest measures 1.8 cm, image 22/4. Several smaller T2 hyperintense cystic foci are noted within the body and tail of pancreas measuring up to 5 mm. No solid enhancing pancreatic mass lesion identified. Spleen: The spleen measures 11.7 cm in cranial caudal dimension. No focal splenic abnormality. Adrenals/Urinary Tract: Normal appearance of the adrenal glands. Bilateral renal cortical scarring is identified right greater than left. T1 hyperintense lesion arising from the posterior cortex of the left kidney measures 1.2 cm, image 44/17. There is no internal enhancement identified within this structure. Scattered T2 hyperintense lesions within both kidneys are again noted. These measure less than 1 cm and are too small to reliably characterize. No solid enhancing mass or hydronephrosis noted. Stomach/Bowel: The stomach appears unremarkable. There is a duodenal diverticula within the left upper quadrant of the abdomen measuring 3.6 cm, image 21/3. The right lower quadrant small bowel mass is better imaged on CT from 01/15/2018. Vascular/Lymphatic: Aortic atherosclerosis. No aneurysm. No retroperitoneal adenopathy. Right lower quadrant soft  tissue mass is better seen on CT from 01/15/2018. Other:  No ascites. Musculoskeletal: No suspicious bone lesions identified. IMPRESSION: 1. Cystic lesions within the head body and tail of pancreas are again noted. Findings are favored to represent either pancreatic pseudocyst, or cystic neoplasm of the pancreas. Follow-up imaging in 12 months with repeat MRI of the pancreas is advised. 2. Cirrhosis of the liver.  No focal liver lesions noted. 3. Right lower quadrant lesions identified on recent CT are better seen on 05/15/2017. 4. Duodenal diverticula Electronically Signed   By: Kerby Moors M.D.   On: 01/16/2018 15:52   Ct Abdomen Pelvis W Contrast  Result Date: 01/15/2018 CLINICAL DATA:  Epigastric pain starting last night. Nausea. Prior cholecystectomy and hysterectomy. EXAM: CT ABDOMEN AND PELVIS WITH CONTRAST TECHNIQUE: Multidetector CT imaging of the abdomen and pelvis was performed using the standard protocol following bolus administration of intravenous contrast. CONTRAST:  29mL OMNIPAQUE IOHEXOL 300 MG/ML SOLN, 173mL OMNIPAQUE IOHEXOL 300 MG/ML SOLN COMPARISON:  None. FINDINGS: Lower chest: Coronary artery calcification is evident. Hepatobiliary: Nodular hepatic contour is consistent with cirrhosis. Gallbladder surgically absent. No intrahepatic or extrahepatic biliary dilation. Pancreas: 17 x 11 mm cystic lesion identified in the head of the pancreas (33/2). Adjacent 12 mm pancreatic head cyst identified on image 31/series 2. No dilatation of the main pancreatic duct. Spleen: No splenomegaly. No focal mass lesion. Adrenals/Urinary Tract: No adrenal nodule or mass. Cortical scarring noted in the kidneys bilaterally, right greater than left with tiny bilateral nonobstructing renal stones. 14 mm exophytic lesion in the upper pole of the left kidney has attenuation higher than would be expected for a simple cyst. 9 mm low-density lesion in the upper pole the right kidney is too small to characterize. No  evidence for hydroureter. The urinary bladder appears normal  for the degree of distention. Stomach/Bowel: Stomach is nondistended. No gastric wall thickening. No evidence of outlet obstruction. Duodenum is normally positioned as is the ligament of Treitz. No small bowel dilatation. There is a segment of small bowel in the right lower quadrant (69/2) head appears mildly distended and shows some wall thickening. Just cranial to and contiguous with this abnormal small bowel segment is a 3.3 x 2.1 x 3.9 cm necrotic appearing soft tissue lesion, potentially a lymph node. This is well demonstrated on coronal image 43 of series 5. Other adjacent smaller lymph nodes are evident. The terminal ileum is normal. The appendix is not visualized, but there is no edema or inflammation in the region of the cecum. Diverticuli are seen scattered along the entire length of the colon without CT findings of diverticulitis. Vascular/Lymphatic: There is abdominal aortic atherosclerosis without aneurysm. No gastrohepatic or hepatoduodenal ligament lymphadenopathy. No retroperitoneal lymphadenopathy. No pelvic sidewall lymphadenopathy. Reproductive: Uterus surgically absent.  There is no adnexal mass. Other: No intraperitoneal free fluid. Musculoskeletal: No worrisome lytic or sclerotic osseous abnormality. IMPRESSION: 1. Abnormal small bowel loop in the right lower quadrant with an adjacent 4 cm necrotic mesenteric lesion, potentially a lymph node. Other smaller lymph nodes are seen in the adjacent mesentery. There is no associated edema or inflammation to suggest that this represents inflammatory bowel disease. Imaging features are not characteristic for diverticulum. As such, small-bowel neoplasm, including carcinoid is a consideration. Lymphoma also possible. 2. Adjacent small hypoattenuating lesions in the pancreatic head appears cystic. These do not have classic appearance for adenocarcinoma and there is no associated ductal  dilatation. However given the findings described immediately above, MRI of the pancreas without with contrast suggested to further evaluate. 3. Nodular hepatic contour is compatible with cirrhosis. 4. Bilateral renal cortical scarring with bilateral tiny nonobstructing renal stones. 14 mm exophytic lesion upper pole left kidney cannot be definitively characterized. If abdominal MRIs performed, attention to the left kidney recommended. Otherwise surveillance may be warranted. Electronically Signed   By: Misty Stanley M.D.   On: 01/15/2018 20:36    EKG:   Orders placed or performed during the hospital encounter of 01/15/18  . ED EKG  . ED EKG    ASSESSMENT AND PLAN:   84 year old female with past medical history significant for GERD, hypertension who is relatively very active at baseline presents to hospital secondary to worsening epigastric pain and also nausea.  1.  Recurrent abdominal pain-secondary to pancreatic cyst and also necrotic lymph node in the abdomen.  Noted on CT. -Recent admission for small bowel obstruction which resolved conservatively. -MRI of the abdomen was done which confirmed the above findings. -Appreciate surgical consult.  Going for laparotomy today for with possible resection of small bowel lesion in the adjacent node. -Appreciate oncology input  2.  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  Going for ex lap and segmental resection of the small bowel with removal of the necrotic lymph node today. -Further management after the biopsy results.  CEA has been ordered.  3.  Hypokalemia-being replaced  4.  GERD-Protonix  5.  DVT prophylaxis-Lovenox  Independent and ambulatory at baseline    All the records are reviewed and case discussed with Care Management/Social Workerr. Management plans discussed with the patient, family and they are in agreement.  CODE STATUS: Full code  TOTAL TIME TAKING CARE  OF THIS PATIENT: 38 minutes.   POSSIBLE D/C IN 2-3 DAYS, DEPENDING ON CLINICAL CONDITION.  Gladstone Lighter M.D on 01/17/2018 at 2:08 PM  Between 7am to 6pm - Pager - [phone]  After 6pm go to [email or web address] - password [email or web address] [facility]  Office  [phone]  CC: Primary care physician; Juline Patch, MD

## 2018-01-17 NOTE — Anesthesia Procedure Notes (Signed)
Procedure Name: Intubation Date/Time: 01/17/2018 1:36 PM Performed by: Dionne Bucy, CRNA Pre-anesthesia Checklist: Patient identified, Patient being monitored, Timeout performed, Emergency Drugs available and Suction available Patient Re-evaluated:Patient Re-evaluated prior to induction Oxygen Delivery Method: Circle system utilized Preoxygenation: Pre-oxygenation with 100% oxygen Induction Type: IV induction Laryngoscope Size: Mac and 3 Grade View: Grade I Tube type: Oral Tube size: 7.0 mm Number of attempts: 1 Airway Equipment and Method: Stylet Placement Confirmation: ETT inserted through vocal cords under direct vision,  positive ETCO2 and breath sounds checked- equal and bilateral Secured at: 20 cm Tube secured with: Tape Dental Injury: Teeth and Oropharynx as per pre-operative assessment

## 2018-01-17 NOTE — Plan of Care (Signed)
Pt returned from procedure VS stable. AO X 3. No questions or concerns expressed. Currently resting comfortably and awakes with ease. Dressing in place over surgical site is clean dry and intact. Family updated and making use of unit family room while pt rest.  Will continue to monitor

## 2018-01-17 NOTE — Progress Notes (Signed)
Potassium IV orders exspried during pt. Surgical procedure. MD paged via secure message.

## 2018-01-17 NOTE — Transfer of Care (Signed)
Immediate Anesthesia Transfer of Care Note  Patient: Savina Venia Carbon  Procedure(s) Performed: EXPLORATORY LAPAROTOMY (N/A ) SMALL BOWEL RESECTION  Patient Location: PACU  Anesthesia Type:General  Level of Consciousness: sedated  Airway & Oxygen Therapy: Patient Spontanous Breathing and Patient connected to face mask oxygen  Post-op Assessment: Report given to RN and Post -op Vital signs reviewed and stable  Post vital signs: Reviewed and stable  Last Vitals:  Vitals Value Taken Time  BP 129/65 01/17/2018  3:37 PM  Temp 37.1 C 01/17/2018  3:37 PM  Pulse 64 01/17/2018  3:42 PM  Resp 17 01/17/2018  3:42 PM  SpO2 97 % 01/17/2018  3:42 PM  Vitals shown include unvalidated device data.  Last Pain:  Vitals:   01/17/18 1222  TempSrc: Tympanic  PainSc: 4          Complications: No apparent anesthesia complications

## 2018-01-17 NOTE — Progress Notes (Signed)
Preoperative Review   Patient is met in the preoperatively. The history is reviewed in the chart and with the patient. I personally reviewed the options and rationale as well as the risks of this procedure that have been previously discussed with the patient. All questions asked by the patient and/or family were answered to their satisfaction.  Patient agrees to proceed with this procedure at this time.    M.D. FACS   

## 2018-01-17 NOTE — Op Note (Signed)
PROCEDURES: Dr. laparotomy with small bowel resection and primary anastomosis  Pre-operative Diagnosis: Small Bowel tumor  Post-operative Diagnosis: Same  Surgeon: Marjory Lies Pabon   Assistants: Dr. Rosana Hoes acquired due to the complexity of the case and for adequate exposure  Anesthesia: General endotracheal anesthesia  ASA Class: 2   Surgeon: Caroleen Hamman , MD FACS  Anesthesia: Gen. with endotracheal tube  Assistant: Mr. Olean Ree [facility]  Findings: SB tumor 25 cm from IC valve, no evidence of distant metastasis Liver w cirrhosis w/o any mets No peritoneal implants  Estimated Blood Loss: 25cc         Drains: none         Specimens: SB       Complications: stable                Procedure Details  The patient was seen again in the Holding Room. The benefits, complications, treatment options, and expected outcomes were discussed with the patient. The risks of bleeding, infection, recurrence of symptoms, failure to resolve symptoms,  bowel injury, any of which could require further surgery were reviewed with the patient.   The patient was taken to Operating Room, identified as Kaitlyn Oliver and the procedure verified.  A Time Out was held and the above information confirmed.  Prior to the induction of general anesthesia, antibiotic prophylaxis was administered. VTE prophylaxis was in place. General endotracheal anesthesia was then administered and tolerated well. After the induction, the abdomen was prepped with Chloraprep and draped in the sterile fashion. The patient was positioned in the supine position.  Midline laparotomy was performed using a 10 blade knife and electrocautery was used to dissect through the cutaneous tissue.  The small bowel was eviscerated and a lesion within the small bowel and invading into the mesentery was visualized.  This was about 25 cm from the ileocecal valve.  Were able to perform a small bowel resection with at least 10 cm margin on each side.  This  was done after a window proximal and distally was created and we divided the small bowel with a GIA staple.  We created a under staple anastomosis but the proximal side looked somewhat ischemic.  This did not improve after 5 minutes of waiting.  Therefore were able to resect an additional portion of the proximal bowel and distal bowel and we created the anastomosis in a side-to-side functional end-to-end staple fashion with standard blue loads.  This time the small bowel looked much healthier and with better perfusion. No leaks or stenosis were observed.   . Liposomal Marcaine was injected under direct visualization. Fascia was closed w 0 PDS running fashion usgin small bites technique 4;1 ratio. Staples used to close the skin. Needle and laparotomy count were correct and there were no immediate complications  Caroleen Hamman, MD, FACS

## 2018-01-18 ENCOUNTER — Encounter: Payer: Self-pay | Admitting: Surgery

## 2018-01-18 LAB — CBC
HEMATOCRIT: 36.3 % (ref 36.0–46.0)
HEMOGLOBIN: 12 g/dL (ref 12.0–15.0)
MCH: 29.4 pg (ref 26.0–34.0)
MCHC: 33.1 g/dL (ref 30.0–36.0)
MCV: 89 fL (ref 80.0–100.0)
NRBC: 0 % (ref 0.0–0.2)
Platelets: 138 10*3/uL — ABNORMAL LOW (ref 150–400)
RBC: 4.08 MIL/uL (ref 3.87–5.11)
RDW: 14.4 % (ref 11.5–15.5)
WBC: 9 10*3/uL (ref 4.0–10.5)

## 2018-01-18 LAB — BASIC METABOLIC PANEL
ANION GAP: 10 (ref 5–15)
BUN: 16 mg/dL (ref 8–23)
CALCIUM: 9 mg/dL (ref 8.9–10.3)
CO2: 26 mmol/L (ref 22–32)
Chloride: 103 mmol/L (ref 98–111)
Creatinine, Ser: 0.98 mg/dL (ref 0.44–1.00)
GFR calc Af Amer: >60 mL/min (ref >60)
GFR calc non Af Amer: 52 mL/min — ABNORMAL LOW (ref >60)
GLUCOSE: 137 mg/dL — AB (ref 70–99)
POTASSIUM: 3.3 mmol/L — AB (ref 3.5–5.1)
Sodium: 139 mmol/L (ref 135–145)

## 2018-01-18 LAB — CA 19-9 (SERIAL): CAN 19-9: 23 U/mL (ref 0–35)

## 2018-01-18 MED ORDER — POTASSIUM CHLORIDE CRYS ER 20 MEQ PO TBCR
20.0000 meq | EXTENDED_RELEASE_TABLET | Freq: Two times a day (BID) | ORAL | Status: DC
  Administered 2018-01-18: 09:00:00 20 meq via ORAL
  Filled 2018-01-18: qty 2
  Filled 2018-01-18: qty 1

## 2018-01-18 MED ORDER — MORPHINE SULFATE (PF) 2 MG/ML IV SOLN
2.0000 mg | INTRAVENOUS | Status: DC | PRN
  Administered 2018-01-18: 2 mg via INTRAVENOUS
  Filled 2018-01-18: qty 1

## 2018-01-18 MED ORDER — OXYCODONE HCL 5 MG PO TABS: 5.0000 mg | ORAL_TABLET | ORAL | Status: DC | PRN

## 2018-01-18 MED ORDER — POTASSIUM CHLORIDE 20 MEQ/15ML (10%) PO SOLN
ORAL | Status: AC
  Administered 2018-01-19: 01:00:00
  Filled 2018-01-18: qty 15

## 2018-01-18 MED ORDER — BOOST / RESOURCE BREEZE PO LIQD CUSTOM
1.0000 | Freq: Three times a day (TID) | ORAL | Status: DC
  Administered 2018-01-18 (×3): 1 via ORAL

## 2018-01-18 NOTE — Plan of Care (Signed)

## 2018-01-18 NOTE — Evaluation (Signed)
Physical Therapy Evaluation Patient Details Name: Kaitlyn Oliver MRN: [id number] DOB: [birth year] Today's Date: 01/18/2018   History of Present Illness  84 y.o. female with a small bowel neoplasm (differential including lymphoma vs carcinoid)  s/p exploratory laparotomy and small bowel resection with primary anastomosis 00/10/6759 which is complicated by pertinent co-morbidities including anxiety/depression, history of Breast CA, HTN, HLD, thyroid disease, obesity, and former tobacco abuse.   Clinical Impression  Patient A&Ox4 at start of session, informed PT she has already walked a loop, but is agreeable to participate with motivation. Current abdomen pain 4/10. The patient reported living in multi level home with husband (bed/bath on first floor) with 2-4 STE. Reported being independent prior to admission in ADLs/IADLs/ambulation.  Upon assessment patient demonstrated bed mobility independently, with verbal cues from PT to teach log rolling technique to minimize abdominal strain with fair carry over. Patient performed sit to stands during session with and without IV pole, ranging from mod I - I. Ambulated ~129ft total with supervision/mod I with IV pole. Able to ambulate without UE support but increased sway in trunk and decreased gait velocity noted. The patient would benefit from skilled PT to continue education as well as to address changes in balance, mobility, and ambulation.    Follow Up Recommendations Home health PT    Equipment Recommendations  None recommended by PT;Other (comment)(Pt has all necessary equipment )    Recommendations for Other Services       Precautions / Restrictions Precautions Precautions: Fall Restrictions Weight Bearing Restrictions: No      Mobility  Bed Mobility Overal bed mobility: Independent             General bed mobility comments: verbal cues to teach log rolling technique  Transfers Overall transfer level: Needs assistance Equipment  used: None(Iv pole) Transfers: Sit to/from Stand Sit to Stand: Modified independent (Device/Increase time);Supervision            Ambulation/Gait Ambulation/Gait assistance: Modified independent (Device/Increase time);Independent Gait Distance (Feet): 170 Feet Assistive device: IV Pole;None Gait Pattern/deviations: WFL(Within Functional Limits) Gait velocity: decreased      Stairs            Wheelchair Mobility    Modified Rankin (Stroke Patients Only)       Balance Overall balance assessment: No apparent balance deficits (not formally assessed)                                           Pertinent Vitals/Pain Pain Assessment: 0-10 Pain Score: 4  Pain Location: abdomen Pain Descriptors / Indicators: Tender;Grimacing;Guarding Pain Intervention(s): Limited activity within patient's tolerance;Monitored during session;Repositioned    Home Living Family/patient expects to be discharged to:: Private residence Living Arrangements: Spouse/significant other Available Help at Discharge: Family Type of Home: House Home Access: Stairs to enter Entrance Stairs-Rails: Left Entrance Stairs-Number of Steps: 4 -2 rails on both Home Layout: Multi-level;Able to live on main level with bedroom/bathroom Home Equipment: Gilford Rile - 2 wheels;Cane - single point;Bedside commode;Shower seat - built in;Grab bars - tub/shower;Grab bars - toilet      Prior Function Level of Independence: Independent               Hand Dominance        Extremity/Trunk Assessment   Upper Extremity Assessment Upper Extremity Assessment: Overall WFL for tasks assessed    Lower Extremity Assessment Lower Extremity Assessment:  Overall WFL for tasks assessed       Communication   Communication: No difficulties  Cognition Arousal/Alertness: Awake/alert Behavior During Therapy: WFL for tasks assessed/performed Overall Cognitive Status: Within Functional Limits for tasks  assessed                                        General Comments      Exercises     Assessment/Plan    PT Assessment Patient needs continued PT services  PT Problem List Decreased strength;Decreased activity tolerance;Decreased mobility       PT Treatment Interventions Balance training;Gait training;Neuromuscular re-education;Therapeutic activities;Therapeutic exercise;Patient/family education    PT Goals (Current goals can be found in the Care Plan section)  Acute Rehab PT Goals Patient Stated Goal: Patient would like to go home PT Goal Formulation: With patient Time For Goal Achievement: 02/01/18 Potential to Achieve Goals: Good    Frequency Min 2X/week   Barriers to discharge        Co-evaluation               AM-PAC PT "6 Clicks" Daily Activity  Outcome Measure Difficulty turning over in bed (including adjusting bedclothes, sheets and blankets)?: None Difficulty moving from lying on back to sitting on the side of the bed? : A Little Difficulty sitting down on and standing up from a chair with arms (e.g., wheelchair, bedside commode, etc,.)?: A Little Help needed moving to and from a bed to chair (including a wheelchair)?: None Help needed walking in hospital room?: None Help needed climbing 3-5 steps with a railing? : A Little 6 Click Score: 21    End of Session Equipment Utilized During Treatment: Gait belt Activity Tolerance: Patient limited by pain Patient left: in bed;with call bell/phone within reach;with bed alarm set;with family/visitor present Nurse Communication: Mobility status PT Visit Diagnosis: Difficulty in walking, not elsewhere classified (R26.2)    Time: 5956-3875 PT Time Calculation (min) (ACUTE ONLY): 23 min   Charges:   PT Evaluation $PT Eval Moderate Complexity: 1 Mod PT Treatments $Therapeutic Activity: 8-22 mins        Lieutenant Diego PT, DPT 5:13 PM,01/18/18 [phone]

## 2018-01-18 NOTE — Plan of Care (Signed)

## 2018-01-18 NOTE — Care Management Note (Signed)
Case Management Note  Patient Details  Name: Kaitlyn Oliver MRN: [id number] Date of Birth: Aug 07, 1933  Subjective/Objective:    Admitted to [facility] with the diagnosis of pancreatitic cyst. Significant other is Jenny Reichmann [phone]). Daughter is Sharlyne Pacas [phone]). Last seen Dr. Ronnald Ramp 01/15/18. Prescriptions are filled at [facility] in [place]. Home Health per [place] 05/2017. No skilled facility. No home oxygen. Rolling walker, shower chair, and bedside commode ib the home. Takes care of all basic activities of daily living herself, drives. No falls. Good appetite.  Laparotomy with small bowel resection and primary anastomosis. 01/17/18                 Action/Plan: Will continue to follow for discharge plans   Expected Discharge Date:  01/18/18               Expected Discharge Plan:     In-House Referral:   yes  Discharge planning Services     Post Acute Care Choice:    Choice offered to:     DME Arranged:    DME Agency:     HH Arranged:    HH Agency:     Status of Service:     If discussed at [employer], dates discussed:    Additional Comments:  Shelbie Ammons, RN MSN CCM Care Management [phone] 01/18/2018, 9:48 AM

## 2018-01-18 NOTE — Progress Notes (Signed)
[place] [facility] [place] NAME: Kaitlyn Oliver    MR#:  [id number]  DATE OF BIRTH:  May 11, 1933  SUBJECTIVE:  CHIEF COMPLAINT:   Chief Complaint  Patient presents with  . Abdominal Pain   - POD #1 to day after boewl resection - significant abdominal pain  REVIEW OF SYSTEMS:  Review of Systems  Constitutional: Positive for malaise/fatigue. Negative for chills and fever.  HENT: Negative for congestion, ear discharge, hearing loss and nosebleeds.   Eyes: Negative for blurred vision and double vision.  Respiratory: Negative for cough, shortness of breath and wheezing.   Cardiovascular: Negative for chest pain and palpitations.  Gastrointestinal: Positive for abdominal pain. Negative for constipation, diarrhea, nausea and vomiting.  Genitourinary: Negative for dysuria.  Neurological: Negative for dizziness, focal weakness, seizures, weakness and headaches.  Psychiatric/Behavioral: Negative for depression.    DRUG ALLERGIES:   Allergies  Allergen Reactions  . Adhesive [Tape] Dermatitis    Can not use plastic tape. Paper tape ok   . Penicillin G Itching    Has patient had a PCN reaction causing immediate rash, facial/tongue/throat swelling, SOB or lightheadedness with hypotension: No Has patient had a PCN reaction causing severe rash involving mucus membranes or skin necrosis: No Has patient had a PCN reaction that required hospitalization: No Has patient had a PCN reaction occurring within the last 10 years: No If all of the above answers are "NO", then may proceed with Cephalosporin use.   Marland Kitchen Hydrocodone-Acetaminophen Itching and Rash    VITALS:  Blood pressure 122/66, pulse 68, temperature 98.6 F (37 C), resp. rate 20, height 5\' 5"  (1.651 m), weight 89.8 kg, SpO2 93 %.  PHYSICAL EXAMINATION:  Physical Exam  GENERAL:  84 y.o.-year-old elderly patient lying in the bed with no acute distress.  EYES: Pupils equal, round, reactive to  light and accommodation. No scleral icterus. Extraocular muscles intact.  HEENT: Head atraumatic, normocephalic. Oropharynx and nasopharynx clear.  NECK:  Supple, no jugular venous distention. No thyroid enlargement, no tenderness.  LUNGS: Normal breath sounds bilaterally, no wheezing, rales,rhonchi or crepitation. No use of accessory muscles of respiration.  Decreased bibasilar breath sounds CARDIOVASCULAR: S1, S2 normal. No rubs, or gallops.  2/6 systolic murmur is present ABDOMEN: Soft, significant tenderness around the incision site,  nondistended. Hypoactive Bowel sounds present. No organomegaly or mass.  EXTREMITIES: No pedal edema, cyanosis, or clubbing.  NEUROLOGIC: Cranial nerves II through XII are intact. Muscle strength 5/5 in all extremities. Sensation intact. Gait not checked.  PSYCHIATRIC: The patient is alert and oriented x 3.  SKIN: No obvious rash, lesion, or ulcer.    LABORATORY PANEL:   CBC Recent Labs  Lab 01/18/18 0339  WBC 9.0  HGB 12.0  HCT 36.3  PLT 138*   ------------------------------------------------------------------------------------------------------------------  Chemistries  Recent Labs  Lab 01/16/18 0444 01/17/18 0603 01/18/18 0339  NA 139 137 139  K 2.7* 3.1* 3.3*  CL 99 100 103  CO2 31 27 26   GLUCOSE 94 112* 137*  BUN 14 12 16   CREATININE 0.85 0.95 0.98  CALCIUM 9.3 9.2 9.0  MG 1.6*  --   --   AST  --  27  --   ALT  --  30  --   ALKPHOS  --  58  --   BILITOT  --  1.4*  --    ------------------------------------------------------------------------------------------------------------------  Cardiac Enzymes No results for input(s): TROPONINI in the last 168 hours. ------------------------------------------------------------------------------------------------------------------  RADIOLOGY:  Mr Abdomen W Wo Contrast  Result Date: 01/16/2018 CLINICAL DATA:  Pancreatic adenocarcinoma. EXAM: MRI ABDOMEN WITHOUT AND WITH CONTRAST  TECHNIQUE: Multiplanar multisequence MR imaging of the abdomen was performed both before and after the administration of intravenous contrast. CONTRAST:  9 cc Gadavist. COMPARISON:  01/15/2018 FINDINGS: Lower chest: No pleural effusion identified. Hepatobiliary: Hepatic steatosis. The liver has a diffusely irregular contour with relative hypertrophy of the lateral segment and caudate lobe of liver. Imaging findings compatible with cirrhosis. No hyperenhancing liver lesions identified. No abnormal areas of washout identified Pancreas: There is no main duct dilatation or pancreatic inflammation identified. There are several cystic lesions identified within the head of pancreas these appear unilocular without internal septation or nodularity. The largest measures 1.8 cm, image 22/4. Several smaller T2 hyperintense cystic foci are noted within the body and tail of pancreas measuring up to 5 mm. No solid enhancing pancreatic mass lesion identified. Spleen: The spleen measures 11.7 cm in cranial caudal dimension. No focal splenic abnormality. Adrenals/Urinary Tract: Normal appearance of the adrenal glands. Bilateral renal cortical scarring is identified right greater than left. T1 hyperintense lesion arising from the posterior cortex of the left kidney measures 1.2 cm, image 44/17. There is no internal enhancement identified within this structure. Scattered T2 hyperintense lesions within both kidneys are again noted. These measure less than 1 cm and are too small to reliably characterize. No solid enhancing mass or hydronephrosis noted. Stomach/Bowel: The stomach appears unremarkable. There is a duodenal diverticula within the left upper quadrant of the abdomen measuring 3.6 cm, image 21/3. The right lower quadrant small bowel mass is better imaged on CT from 01/15/2018. Vascular/Lymphatic: Aortic atherosclerosis. No aneurysm. No retroperitoneal adenopathy. Right lower quadrant soft tissue mass is better seen on CT from  01/15/2018. Other:  No ascites. Musculoskeletal: No suspicious bone lesions identified. IMPRESSION: 1. Cystic lesions within the head body and tail of pancreas are again noted. Findings are favored to represent either pancreatic pseudocyst, or cystic neoplasm of the pancreas. Follow-up imaging in 12 months with repeat MRI of the pancreas is advised. 2. Cirrhosis of the liver.  No focal liver lesions noted. 3. Right lower quadrant lesions identified on recent CT are better seen on 05/15/2017. 4. Duodenal diverticula Electronically Signed   By: Kerby Moors M.D.   On: 01/16/2018 15:52    EKG:   Orders placed or performed during the hospital encounter of 01/15/18  . ED EKG  . ED EKG    ASSESSMENT AND PLAN:   84 year old female with past medical history significant for GERD, hypertension who is relatively very active at baseline presents to hospital secondary to worsening epigastric pain and also nausea.  1.  Recurrent abdominal pain- thickening of small bowel wall- concern for malignancy - also has a pancreatic cyst and also necrotic lymph node in the abdomen.  Noted on CT. -MRI of the abdomen was done which confirmed the above findings. -Appreciate surgical consult.  S/p ex lap and small bowel resection and side to side anastomoses - on clears today, significant pain, pain control- monitor for BM and advance diet -Appreciate oncology input  2.  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  s/p ex lap and segmental resection of the small bowel  - biopsies have been sent -Further management after the biopsy results- can be done as outpatient.  CEA has been ordered.  3.  Hypokalemia- replaced Check mag levels in AM  4.  GERD-Protonix  5.  DVT prophylaxis-Lovenox  Independent  and ambulatory at baseline    All the records are reviewed and case discussed with Care Management/Social Workerr. Management plans discussed with the patient, family and they  are in agreement.  CODE STATUS: Full code  TOTAL TIME TAKING CARE OF THIS PATIENT: 36 minutes.   POSSIBLE D/C IN 2-3 DAYS, DEPENDING ON CLINICAL CONDITION.   Gladstone Lighter M.D on 01/18/2018 at 12:44 PM  Between 7am to 6pm - Pager - [phone]  After 6pm go to [email or web address] - password [email or web address] [facility]  Office  [phone]  CC: Primary care physician; Juline Patch, MD

## 2018-01-18 NOTE — Care Management Important Message (Signed)
Important Message  Patient Details  Name: Kaitlyn Oliver MRN: [id number] Date of Birth: Jul 20, 1933   Medicare Important Message Given:  Yes    [name] 01/18/2018, 10:56 AM

## 2018-01-18 NOTE — Progress Notes (Signed)
[facility] Progress Note  1 Day Post-Op  Subjective: No acute events overnight. She notes diffuse abdominal soreness and some incisional pain. No complaints of fever, chills, nausea, or emesis. Has had sips of her clear liquid diet without adverse event. Not mobilizing yet. No flatus  Objective: Vital signs in last 24 hours: Temp:  [97.5 F (36.4 C)-98.7 F (37.1 C)] 98.6 F (37 C) (11/07 0449) Pulse Rate:  [62-73] 68 (11/07 0449) Resp:  [11-21] 20 (11/07 0449) BP: (106-137)/(57-78) 122/66 (11/07 0449) SpO2:  [86 %-97 %] 93 % (11/07 0449) Last BM Date: 01/15/18  Intake/Output from previous day: 11/06 0701 - 11/07 0700 In: 2003.1 [I.V.:2003.1] Out: 640 [Urine:565; Blood:75] Intake/Output this shift: No intake/output data recorded.  PE: Gen:  Alert, NAD, pleasant Pulm:  Normal effort Abd: Soft, incisional tenderness, non-distended, midline laparotomy incision C/D/I without erythema or drainage Skin: warm and dry, no rashes  Psych: A&Ox3   Lab Results:  Recent Labs    01/17/18 0603 01/18/18 0339  WBC 7.4 9.0  HGB 12.1 12.0  HCT 36.2 36.3  PLT 139* 138*   BMET Recent Labs    01/17/18 0603 01/18/18 0339  NA 137 139  K 3.1* 3.3*  CL 100 103  CO2 27 26  GLUCOSE 112* 137*  BUN 12 16  CREATININE 0.95 0.98  CALCIUM 9.2 9.0   PT/INR Recent Labs    01/17/18 0603  LABPROT 13.8  INR 1.07   CMP     Component Value Date/Time   NA 139 01/18/2018 0339   NA 142 02/15/2017 1137   NA 141 09/09/2011 0445   K 3.3 (L) 01/18/2018 0339   K 3.9 09/09/2011 0445   CL 103 01/18/2018 0339   CL 107 09/09/2011 0445   CO2 26 01/18/2018 0339   CO2 30 09/09/2011 0445   GLUCOSE 137 (H) 01/18/2018 0339   GLUCOSE 114 (H) 09/09/2011 0445   BUN 16 01/18/2018 0339   BUN 18 02/15/2017 1137   BUN 15 09/09/2011 0445   CREATININE 0.98 01/18/2018 0339   CREATININE 1.14 01/16/2014 1520   CALCIUM 9.0 01/18/2018 0339   CALCIUM 8.7 09/09/2011 0445   PROT 6.9  01/17/2018 0603   ALBUMIN 3.8 01/17/2018 0603   ALBUMIN 4.6 02/15/2017 1137   AST 27 01/17/2018 0603   ALT 30 01/17/2018 0603   ALKPHOS 58 01/17/2018 0603   BILITOT 1.4 (H) 01/17/2018 0603   GFRNONAA 52 (L) 01/18/2018 0339   GFRNONAA 49 (L) 01/16/2014 1520   GFRNONAA 50 (L) 12/09/2011 1109   GFRAA >60 01/18/2018 0339   GFRAA 59 (L) 01/16/2014 1520   GFRAA 58 (L) 12/09/2011 1109   Lipase     Component Value Date/Time   LIPASE 35 01/15/2018 1514       Studies/Results: Mr Abdomen W Wo Contrast  Result Date: 01/16/2018 CLINICAL DATA:  Pancreatic adenocarcinoma. EXAM: MRI ABDOMEN WITHOUT AND WITH CONTRAST TECHNIQUE: Multiplanar multisequence MR imaging of the abdomen was performed both before and after the administration of intravenous contrast. CONTRAST:  9 cc Gadavist. COMPARISON:  01/15/2018 FINDINGS: Lower chest: No pleural effusion identified. Hepatobiliary: Hepatic steatosis. The liver has a diffusely irregular contour with relative hypertrophy of the lateral segment and caudate lobe of liver. Imaging findings compatible with cirrhosis. No hyperenhancing liver lesions identified. No abnormal areas of washout identified Pancreas: There is no main duct dilatation or pancreatic inflammation identified. There are several cystic lesions identified within the head of pancreas these appear unilocular without internal septation or  nodularity. The largest measures 1.8 cm, image 22/4. Several smaller T2 hyperintense cystic foci are noted within the body and tail of pancreas measuring up to 5 mm. No solid enhancing pancreatic mass lesion identified. Spleen: The spleen measures 11.7 cm in cranial caudal dimension. No focal splenic abnormality. Adrenals/Urinary Tract: Normal appearance of the adrenal glands. Bilateral renal cortical scarring is identified right greater than left. T1 hyperintense lesion arising from the posterior cortex of the left kidney measures 1.2 cm, image 44/17. There is no  internal enhancement identified within this structure. Scattered T2 hyperintense lesions within both kidneys are again noted. These measure less than 1 cm and are too small to reliably characterize. No solid enhancing mass or hydronephrosis noted. Stomach/Bowel: The stomach appears unremarkable. There is a duodenal diverticula within the left upper quadrant of the abdomen measuring 3.6 cm, image 21/3. The right lower quadrant small bowel mass is better imaged on CT from 01/15/2018. Vascular/Lymphatic: Aortic atherosclerosis. No aneurysm. No retroperitoneal adenopathy. Right lower quadrant soft tissue mass is better seen on CT from 01/15/2018. Other:  No ascites. Musculoskeletal: No suspicious bone lesions identified. IMPRESSION: 1. Cystic lesions within the head body and tail of pancreas are again noted. Findings are favored to represent either pancreatic pseudocyst, or cystic neoplasm of the pancreas. Follow-up imaging in 12 months with repeat MRI of the pancreas is advised. 2. Cirrhosis of the liver.  No focal liver lesions noted. 3. Right lower quadrant lesions identified on recent CT are better seen on 05/15/2017. 4. Duodenal diverticula Electronically Signed   By: Kerby Moors M.D.   On: 01/16/2018 15:52    Anti-infectives: Anti-infectives (From admission, onward)   Start     Dose/Rate Route Frequency Ordered Stop   01/17/18 0600  ertapenem (INVANZ) 1,000 mg in sodium chloride 0.9 % 100 mL IVPB     1 g 200 mL/hr over 30 Minutes Intravenous  Once 01/16/18 1613 01/17/18 1407       Assessment/Plan  Small Bowel Neoplasm Kaitlyn Oliver is a 84 y.o. female with a small bowel neoplasm (differential including lymphoma vs carcinoid) who is 1 day s/p exploratory laparotomy and small bowel resection with primary anastomosis which is complicated by pertinent co-morbidities including anxiety/depression, history of Breast CA, HTN, HLD, thyroid disease, obesity, and former tobacco abuse.     - Clear  liquid diet + boost supplementation, advance as tolerated  - Continue IVF, wean as diet advancing  - Monitor abdominal exam and on-going bowel function  - Discontinue foley catheter  - Mild hypokalemia, replete, morning BMP  - Surgical pathology pending  - Pain control (Minimize Narcotics)   - Mobilize  - DVT Prophylaxis  -- Edison Simon , PA-C [facility] 01/18/2018, 7:20 AM [phone] M-F: 7am - 4pm

## 2018-01-19 ENCOUNTER — Telehealth: Payer: Self-pay

## 2018-01-19 ENCOUNTER — Telehealth: Payer: Self-pay | Admitting: Family Medicine

## 2018-01-19 LAB — BASIC METABOLIC PANEL
ANION GAP: 7 (ref 5–15)
BUN: 26 mg/dL — ABNORMAL HIGH (ref 8–23)
CO2: 25 mmol/L (ref 22–32)
CREATININE: 1.01 mg/dL — AB (ref 0.44–1.00)
Calcium: 8.5 mg/dL — ABNORMAL LOW (ref 8.9–10.3)
Chloride: 107 mmol/L (ref 98–111)
GFR calc non Af Amer: 50 mL/min — ABNORMAL LOW (ref >60)
GFR, EST AFRICAN AMERICAN: 58 mL/min — AB (ref >60)
Glucose, Bld: 96 mg/dL (ref 70–99)
Potassium: 3.2 mmol/L — ABNORMAL LOW (ref 3.5–5.1)
SODIUM: 139 mmol/L (ref 135–145)

## 2018-01-19 LAB — MAGNESIUM: MAGNESIUM: 1.8 mg/dL (ref 1.7–2.4)

## 2018-01-19 MED ORDER — POTASSIUM CHLORIDE 20 MEQ PO PACK
40.0000 meq | PACK | Freq: Once | ORAL | Status: AC
  Administered 2018-01-19: 09:00:00 40 meq via ORAL
  Filled 2018-01-19: qty 2

## 2018-01-19 MED ORDER — SODIUM CHLORIDE 0.9 % IV SOLN: INTRAVENOUS | Status: DC

## 2018-01-19 MED ORDER — ENSURE ENLIVE PO LIQD: 237.0000 mL | Freq: Two times a day (BID) | ORAL | 12 refills | Status: DC

## 2018-01-19 MED ORDER — POTASSIUM CHLORIDE CRYS ER 20 MEQ PO TBCR: 40.0000 meq | EXTENDED_RELEASE_TABLET | Freq: Once | ORAL | Status: DC

## 2018-01-19 MED ORDER — POTASSIUM CHLORIDE IN NACL 20-0.9 MEQ/L-% IV SOLN
INTRAVENOUS | Status: DC
  Administered 2018-01-19: 09:00:00 via INTRAVENOUS
  Filled 2018-01-19 (×4): qty 1000

## 2018-01-19 MED ORDER — OXYCODONE HCL 5 MG PO TABS: 5.0000 mg | ORAL_TABLET | Freq: Four times a day (QID) | ORAL | 0 refills | Status: DC | PRN

## 2018-01-19 MED ORDER — ENSURE ENLIVE PO LIQD
237.0000 mL | Freq: Two times a day (BID) | ORAL | Status: DC
  Administered 2018-01-19: 11:00:00 237 mL via ORAL

## 2018-01-19 NOTE — Telephone Encounter (Signed)
error 

## 2018-01-19 NOTE — Care Management Note (Signed)
Case Management Note  Patient Details  Name: Kaitlyn Oliver MRN: [id number] Date of Birth: January 29, 1934  Subjective/Objective:   Patient to be discharged per MD order. Orders in place for home health services. Patient prefers [facility]. Referral placed with Malachy Mood who agrees to accept patient for RN and PT care. No DME needs. Family to transport.                   Action/Plan:   Expected Discharge Date:  01/19/18               Expected Discharge Plan:  [place]  In-House Referral:     Discharge planning Services  CM Consult  Post Acute Care Choice:  Home Health Choice offered to:  Patient, Adult Children  DME Arranged:    DME Agency:     HH Arranged:  RN, PT HH Agency:  [facility]  Status of Service:  Completed, signed off  If discussed at [place] of Stay Meetings, dates discussed:    Additional Comments:  Latanya Maudlin, RN 01/19/2018, 12:11 PM

## 2018-01-19 NOTE — Care Management (Signed)
RNCM consulted on patient for possible home health needs. Patient has support from multiple family members. Patient is independent at baseline but reports some increasing weakness after her surgery. Patient has used Home health in the past for a knee replacement. RNCM team will follow to assist with transition of care when appropriate.  Ines Bloomer RN BSN RNCM [phone]

## 2018-01-19 NOTE — Discharge Summary (Signed)
Kaitlyn Oliver at [facility] NAME: Kaitlyn Oliver    MR#:  [id number]  DATE OF BIRTH:  [birth year]  DATE OF ADMISSION:  01/15/2018   ADMITTING PHYSICIAN: Lance Coon, MD  DATE OF DISCHARGE:  01/19/18  PRIMARY CARE PHYSICIAN: Juline Patch, MD   ADMISSION DIAGNOSIS:   Pancreatic cyst [K86.2] Epigastric pain [R10.13] Kidney cysts [N28.1]  DISCHARGE DIAGNOSIS:   Principal Problem:   Pancreatic cyst Active Problems:   Essential (primary) hypertension   Hypothyroidism   GERD (gastroesophageal reflux disease)   Anxiety and depression   Pancreas cyst   Small bowel tumor   SECONDARY DIAGNOSIS:   Past Medical History:  Diagnosis Date  . Anxiety   . Arthritis    thumbs, hands  . Cancer [facility]) 1991   bilateral breast  . Depression   . GERD (gastroesophageal reflux disease)   . Headache    occasional - pinched nerve in neck  . Hypertension   . Hypothyroidism   . Thyroid disease   . Wears dentures    full upper, partial lower    HOSPITAL COURSE:   84 year old female with past medical history significant for GERD, hypertension who is relatively very active at baseline presents to hospital secondary to worsening epigastric pain and also nausea.  1.  Recurrent abdominal pain- thickening of small bowel wall- concern for malignancy - also has a pancreatic cyst and also necrotic lymph node in the abdomen.  Noted on CT. -MRI of the abdomen was done which confirmed the above findings. -Appreciate surgical consult.  S/p ex lap and small bowel resection and side to side anastomoses- POD # 2 today -Doing much better, able to get up and ambulate.  Diet advanced to regular as having bowel movements.  2.  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  s/p ex lap and segmental resection of the small bowel  - biopsies have been sent -Further management after the biopsy results- can be done as outpatient.  CEA  and CA 19-9 within normal limits.  3.  Hypokalemia- replaced  4.  GERD-prilosec  5. HTN- on norvasc  Independent and ambulatory at baseline   DISCHARGE CONDITIONS:   Guarded  CONSULTS OBTAINED:   Treatment Team:  Jules Husbands, MD Sindy Guadeloupe, MD  DRUG ALLERGIES:   Allergies  Allergen Reactions  . Adhesive [Tape] Dermatitis    Can not use plastic tape. Paper tape ok   . Penicillin G Itching    Has patient had a PCN reaction causing immediate rash, facial/tongue/throat swelling, SOB or lightheadedness with hypotension: No Has patient had a PCN reaction causing severe rash involving mucus membranes or skin necrosis: No Has patient had a PCN reaction that required hospitalization: No Has patient had a PCN reaction occurring within the last 10 years: No If all of the above answers are "NO", then may proceed with Cephalosporin use.   Marland Kitchen Hydrocodone-Acetaminophen Itching and Rash   DISCHARGE MEDICATIONS:   Allergies as of 01/19/2018      Reactions   Adhesive [tape] Dermatitis   Can not use plastic tape. Paper tape ok    Penicillin G Itching   Has patient had a PCN reaction causing immediate rash, facial/tongue/throat swelling, SOB or lightheadedness with hypotension: No Has patient had a PCN reaction causing severe rash involving mucus membranes or skin necrosis: No Has patient had a PCN reaction that required hospitalization: No Has patient had a PCN reaction occurring within the last  10 years: No If all of the above answers are "NO", then may proceed with Cephalosporin use.   Hydrocodone-acetaminophen Itching, Rash      Medication List    TAKE these medications   acetaminophen 325 MG tablet Commonly known as:  TYLENOL Take 1-2 tablets (325-650 mg total) by mouth every 6 (six) hours as needed for mild pain (pain score 1-3 or temp > 100.5).   amLODipine 5 MG tablet Commonly known as:  NORVASC Take 5 mg by mouth daily.   feeding supplement (ENSURE ENLIVE)  Liqd Take 237 mLs by mouth 2 (two) times daily between meals.   omeprazole 20 MG capsule Commonly known as:  PRILOSEC Take 20 mg by mouth daily.   oxyCODONE 5 MG immediate release tablet Commonly known as:  Oxy IR/ROXICODONE Take 1-2 tablets (5-10 mg total) by mouth every 6 (six) hours as needed for moderate pain or severe pain.        DISCHARGE INSTRUCTIONS:   1. PCP f/u in 1-2 weeks 2. Oncology f/u in 1 week 3. Surgery f/u in 1 week  DIET:   Cardiac diet  ACTIVITY:   Activity as tolerated  OXYGEN:   Home Oxygen: No.  Oxygen Delivery: room air  DISCHARGE LOCATION:   home   If you experience worsening of your admission symptoms, develop shortness of breath, life threatening emergency, suicidal or homicidal thoughts you must seek medical attention immediately by calling 911 or calling your MD immediately  if symptoms less severe.  You Must read complete instructions/literature along with all the possible adverse reactions/side effects for all the Medicines you take and that have been prescribed to you. Take any new Medicines after you have completely understood and accpet all the possible adverse reactions/side effects.   Please note  You were cared for by a hospitalist during your hospital stay. If you have any questions about your discharge medications or the care you received while you were in the hospital after you are discharged, you can call the unit and asked to speak with the hospitalist on call if the hospitalist that took care of you is not available. Once you are discharged, your primary care physician will handle any further medical issues. Please note that NO REFILLS for any discharge medications will be authorized once you are discharged, as it is imperative that you return to your primary care physician (or establish a relationship with a primary care physician if you do not have one) for your aftercare needs so that they can reassess your need for  medications and monitor your lab values.    On the day of Discharge:  VITAL SIGNS:   Blood pressure 140/71, pulse 64, temperature 98.5 F (36.9 C), temperature source Oral, resp. rate 17, height 5\' 5"  (1.651 m), weight 89.8 kg, SpO2 93 %.  PHYSICAL EXAMINATION:   GENERAL:  84 y.o.-year-old elderly patient lying in the bed with no acute distress.  EYES: Pupils equal, round, reactive to light and accommodation. No scleral icterus. Extraocular muscles intact.  HEENT: Head atraumatic, normocephalic. Oropharynx and nasopharynx clear.  NECK:  Supple, no jugular venous distention. No thyroid enlargement, no tenderness.  LUNGS: Normal breath sounds bilaterally, no wheezing, rales,rhonchi or crepitation. No use of accessory muscles of respiration.  Decreased bibasilar breath sounds CARDIOVASCULAR: S1, S2 normal. No rubs, or gallops.  2/6 systolic murmur is present ABDOMEN: Soft, improved tenderness around the incision site,  nondistended. good Bowel sounds present. No organomegaly or mass.  EXTREMITIES: No pedal edema, cyanosis,  or clubbing.  NEUROLOGIC: Cranial nerves II through XII are intact. Muscle strength 5/5 in all extremities. Sensation intact. Gait not checked.  PSYCHIATRIC: The patient is alert and oriented x 3.  SKIN: No obvious rash, lesion, or ulcer.   DATA REVIEW:   CBC Recent Labs  Lab 01/18/18 0339  WBC 9.0  HGB 12.0  HCT 36.3  PLT 138*    Chemistries  Recent Labs  Lab 01/17/18 0603  01/19/18 0449  NA 137   < > 139  K 3.1*   < > 3.2*  CL 100   < > 107  CO2 27   < > 25  GLUCOSE 112*   < > 96  BUN 12   < > 26*  CREATININE 0.95   < > 1.01*  CALCIUM 9.2   < > 8.5*  MG  --   --  1.8  AST 27  --   --   ALT 30  --   --   ALKPHOS 58  --   --   BILITOT 1.4*  --   --    < > = values in this interval not displayed.     Microbiology Results  Results for orders placed or performed in visit on 09/05/17  Stool C-Diff Toxin Assay     Status: None   Collection Time:  09/06/17  8:15 AM  Result Value Ref Range Status   C difficile Toxins A+B, EIA Negative Negative Final    RADIOLOGY:  No results found.   Management plans discussed with the patient, family and they are in agreement.  CODE STATUS:     Code Status Orders  (From admission, onward)         Start     Ordered   01/16/18 0036  Full code  Continuous     01/16/18 0035        Code Status History    Date Active Date Inactive Code Status Order ID Comments User Context   11/29/2017 1343 12/01/2017 1723 Full Code [id number]  Gorden Harms, MD Inpatient   05/30/2017 1027 06/01/2017 1831 Full Code [id number]  Hessie Knows, MD Inpatient   08/30/2016 1444 08/30/2016 1855 Full Code [id number]  Hessie Knows, MD Inpatient    Advance Directive Documentation     Most Recent Value  Type of Advance Directive  Healthcare Power of Attorney  Pre-existing out of facility DNR order (yellow form or pink MOST form)  -  "MOST" Form in Place?  -      TOTAL TIME TAKING CARE OF THIS PATIENT: 38 minutes.    Gladstone Lighter M.D on 01/19/2018 at 10:40 AM  Between 7am to 6pm - Pager - [phone]  After 6pm go to [email or web address] - Proofreader  [facility]  Office  [phone]  CC: Primary care physician; Juline Patch, MD   Note: This dictation was prepared with Dragon dictation along with smaller phrase technology. Any transcriptional errors that result from this process are unintentional.

## 2018-01-19 NOTE — Telephone Encounter (Signed)
Copied from [place] [id number]. Topic: General - Other >> Jan 19, 2018  5:02 PM Keene Breath wrote:  Error

## 2018-01-19 NOTE — Progress Notes (Signed)
[facility] Progress Note  2 Days Post-Op   Subjective: No acute events overnight. Patient notes improvement in her abdominal soreness. She has been mobilizing. Tolerating diet without nausea or emesis. Endorses passing flatus.    Objective: Vital signs in last 24 hours: Temp:  [97.8 F (36.6 C)-98.1 F (36.7 C)] 97.8 F (36.6 C) (11/08 0506) Pulse Rate:  [68-77] 68 (11/08 0506) Resp:  [17-20] 17 (11/08 0506) BP: (122-137)/(65-75) 127/75 (11/08 0506) SpO2:  [91 %-92 %] 91 % (11/08 0506) Last BM Date: 01/15/18  Intake/Output from previous day: 11/07 0701 - 11/08 0700 In: 1343.3 [I.V.:1343.3] Out: 200 [Urine:200] Intake/Output this shift: No intake/output data recorded.  PE: Gen:  Alert, NAD, pleasant Pulm:  Normal effort Abd: Soft, improved incisional tenderness, non-distended, midline laparotomy incision C/D/I without erythema or drainage Skin: warm and dry, no rashes  Psych: A&Ox3   Lab Results:  Recent Labs    01/17/18 0603 01/18/18 0339  WBC 7.4 9.0  HGB 12.1 12.0  HCT 36.2 36.3  PLT 139* 138*   BMET Recent Labs    01/18/18 0339 01/19/18 0449  NA 139 139  K 3.3* 3.2*  CL 103 107  CO2 26 25  GLUCOSE 137* 96  BUN 16 26*  CREATININE 0.98 1.01*  CALCIUM 9.0 8.5*   PT/INR Recent Labs    01/17/18 0603  LABPROT 13.8  INR 1.07   CMP     Component Value Date/Time   NA 139 01/19/2018 0449   NA 142 02/15/2017 1137   NA 141 09/09/2011 0445   K 3.2 (L) 01/19/2018 0449   K 3.9 09/09/2011 0445   CL 107 01/19/2018 0449   CL 107 09/09/2011 0445   CO2 25 01/19/2018 0449   CO2 30 09/09/2011 0445   GLUCOSE 96 01/19/2018 0449   GLUCOSE 114 (H) 09/09/2011 0445   BUN 26 (H) 01/19/2018 0449   BUN 18 02/15/2017 1137   BUN 15 09/09/2011 0445   CREATININE 1.01 (H) 01/19/2018 0449   CREATININE 1.14 01/16/2014 1520   CALCIUM 8.5 (L) 01/19/2018 0449   CALCIUM 8.7 09/09/2011 0445   PROT 6.9 01/17/2018 0603   ALBUMIN 3.8 01/17/2018 0603   ALBUMIN 4.6 02/15/2017 1137   AST 27 01/17/2018 0603   ALT 30 01/17/2018 0603   ALKPHOS 58 01/17/2018 0603   BILITOT 1.4 (H) 01/17/2018 0603   GFRNONAA 50 (L) 01/19/2018 0449   GFRNONAA 49 (L) 01/16/2014 1520   GFRNONAA 50 (L) 12/09/2011 1109   GFRAA 58 (L) 01/19/2018 0449   GFRAA 59 (L) 01/16/2014 1520   GFRAA 58 (L) 12/09/2011 1109   Lipase     Component Value Date/Time   LIPASE 35 01/15/2018 1514       Studies/Results: No results found.  Anti-infectives: Anti-infectives (From admission, onward)   Start     Dose/Rate Route Frequency Ordered Stop   01/17/18 0600  ertapenem (INVANZ) 1,000 mg in sodium chloride 0.9 % 100 mL IVPB     1 g 200 mL/hr over 30 Minutes Intravenous  Once 01/16/18 1613 01/17/18 1407       Assessment/Plan  Small Bowel Neoplasm Kaitlyn Oliver is a 84 y.o. female with a small bowel neoplasm (differential including lymphoma vs carcinoid) who is 2 days s/p exploratory laparotomy and small bowel resection with primary anastomosis and mild AKI and hypokalemia which is complicated by pertinent co-morbidities including anxiety/depression, history of Breast CA, HTN, HLD, thyroid disease, obesity, and former tobacco abuse.                           -  Regular diet + Ensure supplementation             - Continue IVF given AKI             - Monitor abdominal exam and on-going bowel function             - Discontinue foley catheter             - Mild hypokalemia, replete, morning BMP             - Surgical pathology pending             - Pain control (Minimize Narcotics)              - Mobilize, PT following and recommending HHPT             - DVT Prophylaxis  - Medicine primary, appreciate their help  -- Edison Simon , PA-C [facility] 01/19/2018, 7:56 AM [phone] M-F: 7am - 4pm

## 2018-01-22 ENCOUNTER — Telehealth: Payer: Self-pay

## 2018-01-22 NOTE — Telephone Encounter (Signed)
Transition Care Management Follow-up Telephone Call  Date of discharge and from where: 01/19/18  How have you been since you were released from the hospital? Feeling much better   Any questions or concerns? Yes  pt feels like she may be developing an allergic reactions to pain medication oxycodone due to welps on buttocks but they are not bothersome and she typically only takes pain medication once a day. Advised may take benadryl but if sxs worsen or persist to STOP medication and notify our office.   Items Reviewed:  Did the pt receive and understand the discharge instructions provided? Yes  no activity restrictions, keep incisions site clean and dry  Medications obtained and verified? Yes   Any new allergies since your discharge? No   Dietary orders reviewed? Yes normal diet  Do you have support at home? Yes   Functional Questionnaire: (I = Independent and D = Dependent) ADLs: I  Bathing/Dressing- I  Meal Prep- I  Eating- I  Maintaining continence- I  Transferring/Ambulation- I  Managing Meds- I  Follow up appointments reviewed:   PCP Hospital f/u appt confirmed? Yes  Scheduled to see Dr. Ronnald Ramp on 02/02/18 @ 2:40.  [facility] f/u appt confirmed? Yes  Scheduled to see Dr. Janese Banks (oncology) on 01/25/18 @ 1:00.  [facility] f/u appt confirmed? Yes  Scheduled to see Dr. Dahlia Byes (general surgery)  on 01/29/18 @ 2:00.  Are transportation arrangements needed? No   If their condition worsens, is the pt aware to call PCP or go to the Emergency Dept.? YES  Was the patient provided with contact information for the PCP's office or ED? Yes  Was to pt encouraged to call back with questions or concerns? Yes  Pt appreciative of call; per pt home health ordered in hospital for nursing and physical therapy through [facility], states she has not heard from them yet, possibly due to late d/c on Friday. Advised patient if she does not hear from them today to call  me back at the office to help coordinate care.

## 2018-01-23 DIAGNOSIS — M1990 Unspecified osteoarthritis, unspecified site: Secondary | ICD-10-CM | POA: Diagnosis not present

## 2018-01-23 DIAGNOSIS — Z853 Personal history of malignant neoplasm of breast: Secondary | ICD-10-CM | POA: Diagnosis not present

## 2018-01-23 DIAGNOSIS — Z483 Aftercare following surgery for neoplasm: Secondary | ICD-10-CM | POA: Diagnosis not present

## 2018-01-23 DIAGNOSIS — K219 Gastro-esophageal reflux disease without esophagitis: Secondary | ICD-10-CM | POA: Diagnosis not present

## 2018-01-23 DIAGNOSIS — I10 Essential (primary) hypertension: Secondary | ICD-10-CM | POA: Diagnosis not present

## 2018-01-23 DIAGNOSIS — C179 Malignant neoplasm of small intestine, unspecified: Secondary | ICD-10-CM | POA: Diagnosis not present

## 2018-01-23 DIAGNOSIS — E039 Hypothyroidism, unspecified: Secondary | ICD-10-CM | POA: Diagnosis not present

## 2018-01-23 DIAGNOSIS — R69 Illness, unspecified: Secondary | ICD-10-CM | POA: Diagnosis not present

## 2018-01-23 DIAGNOSIS — N281 Cyst of kidney, acquired: Secondary | ICD-10-CM | POA: Diagnosis not present

## 2018-01-23 LAB — SURGICAL PATHOLOGY

## 2018-01-24 NOTE — Telephone Encounter (Signed)
Left msg to follow up with patient to make sure she had been contacted by [facility] for home health services.

## 2018-01-25 ENCOUNTER — Other Ambulatory Visit: Payer: Medicare HMO

## 2018-01-25 DIAGNOSIS — Z483 Aftercare following surgery for neoplasm: Secondary | ICD-10-CM | POA: Diagnosis not present

## 2018-01-25 DIAGNOSIS — E039 Hypothyroidism, unspecified: Secondary | ICD-10-CM | POA: Diagnosis not present

## 2018-01-25 DIAGNOSIS — C179 Malignant neoplasm of small intestine, unspecified: Secondary | ICD-10-CM | POA: Diagnosis not present

## 2018-01-25 DIAGNOSIS — R69 Illness, unspecified: Secondary | ICD-10-CM | POA: Diagnosis not present

## 2018-01-25 DIAGNOSIS — M1990 Unspecified osteoarthritis, unspecified site: Secondary | ICD-10-CM | POA: Diagnosis not present

## 2018-01-25 DIAGNOSIS — K219 Gastro-esophageal reflux disease without esophagitis: Secondary | ICD-10-CM | POA: Diagnosis not present

## 2018-01-25 DIAGNOSIS — Z853 Personal history of malignant neoplasm of breast: Secondary | ICD-10-CM | POA: Diagnosis not present

## 2018-01-25 DIAGNOSIS — I10 Essential (primary) hypertension: Secondary | ICD-10-CM | POA: Diagnosis not present

## 2018-01-25 DIAGNOSIS — N281 Cyst of kidney, acquired: Secondary | ICD-10-CM | POA: Diagnosis not present

## 2018-01-25 NOTE — Progress Notes (Signed)
Tumor Board Documentation  Kaitlyn Oliver was presented by Dr Janese Banks at our Tumor Board on 01/25/2018, which included representatives from medical oncology, radiation oncology, internal medicine, navigation, pathology, radiology, surgical, pharmacy, research, palliative care, pulmonology.  Kaitlyn Oliver currently presents as a new patient with history of the following treatments: surgical intervention(s).  Additionally, we reviewed previous medical and familial history, history of present illness, and recent lab results along with all available histopathologic and imaging studies. The tumor board considered available treatment options and made the following recommendations: Additional observation    The following procedures/referrals were also placed: No orders of the defined types were placed in this encounter.   Clinical Trial Status: not discussed   Staging used:    National site-specific guidelines   were discussed with respect to the case.  Tumor board is a meeting of clinicians from various specialty areas who evaluate and discuss patients for whom a multidisciplinary approach is being considered. Final determinations in the plan of care are those of the provider(s). The responsibility for follow up of recommendations given during tumor board is that of the provider.   Today's extended care, comprehensive team conference, Kaitlyn Oliver was not present for the discussion and was not examined.   Multidisciplinary Tumor Board is a multidisciplinary case peer review process.  Decisions discussed in the Multidisciplinary Tumor Board reflect the opinions of the specialists present at the conference without having examined the patient.  Ultimately, treatment and diagnostic decisions rest with the primary provider(s) and the patient.

## 2018-01-26 ENCOUNTER — Encounter: Payer: Self-pay | Admitting: Oncology

## 2018-01-26 ENCOUNTER — Inpatient Hospital Stay: Payer: Medicare HMO

## 2018-01-26 ENCOUNTER — Inpatient Hospital Stay: Payer: Medicare HMO | Attending: Oncology | Admitting: Oncology

## 2018-01-26 VITALS — BP 144/87 | HR 67 | Temp 97.3°F | Resp 18 | Ht 65.0 in | Wt 199.0 lb

## 2018-01-26 DIAGNOSIS — Z9010 Acquired absence of unspecified breast and nipple: Secondary | ICD-10-CM | POA: Insufficient documentation

## 2018-01-26 DIAGNOSIS — C7A8 Other malignant neuroendocrine tumors: Secondary | ICD-10-CM

## 2018-01-26 DIAGNOSIS — E039 Hypothyroidism, unspecified: Secondary | ICD-10-CM | POA: Diagnosis not present

## 2018-01-26 DIAGNOSIS — Z853 Personal history of malignant neoplasm of breast: Secondary | ICD-10-CM | POA: Diagnosis not present

## 2018-01-26 DIAGNOSIS — Z87891 Personal history of nicotine dependence: Secondary | ICD-10-CM | POA: Insufficient documentation

## 2018-01-26 DIAGNOSIS — I10 Essential (primary) hypertension: Secondary | ICD-10-CM | POA: Diagnosis not present

## 2018-01-26 DIAGNOSIS — Z7189 Other specified counseling: Secondary | ICD-10-CM

## 2018-01-26 DIAGNOSIS — F329 Major depressive disorder, single episode, unspecified: Secondary | ICD-10-CM | POA: Diagnosis not present

## 2018-01-26 DIAGNOSIS — R69 Illness, unspecified: Secondary | ICD-10-CM | POA: Diagnosis not present

## 2018-01-26 NOTE — Progress Notes (Signed)
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  Patient also was admitted for an episode of bowel obstruction about 2 months ago which resolved conservatively.  CT abdomen done during this admission showed an abnormal small bowel loop in the right lower quadrant and adjacent 4 cm necrotic mesenteric lesion which could be a potential lymph node.  Patient also noted to have hypoattenuating cystic lesions in the head of the pancreas.  Hepatic cirrhosis.  This was followed by MRI of the abdomen with and without contrast which again showed multiple cystic lesions in the pancreas either pancreatic pseudocyst or cystic neoplasm of the pancreas.   Patient had exploratory laparotomy and resection of the small bowel tumor.  Pathology showed well-differentiated neuroendocrine tumor grade 1, 3.7 cm mitotic rate less than 2 per 10 high-power fields.  Ki-67 less than 3%.  Tumor invades through the muscularis propria into subserosal tissue.  All margins negative.  L VIN PNI present.  Large mesenteric mass greater than 2 cm present.  Regional lymph nodes 5 out of 14 were involved.  pT3N2  Interval history-she is slowly recovering from her recent surgery.  Reports mild soreness in her abdomen.  Bowel movements are regular.  ECOG PS- 1 Pain scale- 4 Opioid associated constipation-  no  Review of systems- Review of Systems  Constitutional: Negative for chills, fever, malaise/fatigue and weight loss.  HENT: Negative for congestion, ear discharge and nosebleeds.   Eyes: Negative for blurred vision.  Respiratory: Negative for cough, hemoptysis, sputum production, shortness of breath and wheezing.   Cardiovascular: Negative for chest pain, palpitations, orthopnea and claudication.  Gastrointestinal: Positive for abdominal pain. Negative for blood in stool, constipation, diarrhea, heartburn, melena, nausea and vomiting.  Genitourinary: Negative for dysuria, flank pain, frequency, hematuria and urgency.  Musculoskeletal: Negative for back pain, joint pain and myalgias.  Skin: Negative for rash.  Neurological: Negative for dizziness, tingling, focal weakness, seizures, weakness and headaches.  Endo/Heme/Allergies: Does not bruise/bleed easily.  Psychiatric/Behavioral: Negative for depression and suicidal ideas. The patient does not have insomnia.       Allergies  Allergen Reactions  . Adhesive [Tape] Dermatitis    Can not use plastic tape. Paper tape ok   . Penicillin G Itching    Has patient had a PCN reaction causing immediate rash, facial/tongue/throat swelling, SOB or lightheadedness with hypotension: No Has patient had a PCN reaction causing severe rash involving mucus membranes or skin necrosis: No Has patient had a PCN reaction that required hospitalization: No Has patient had a PCN reaction occurring within the last 10 years: No If all of the above answers are "NO", then may proceed with Cephalosporin use.   Marland Kitchen Hydrocodone-Acetaminophen Itching and Rash     Past Medical History:  Diagnosis Date  . Anxiety   . Arthritis    thumbs, hands  . Cancer [facility]) 1991   bilateral breast  . Depression   . GERD (gastroesophageal reflux disease)   .  Headache    occasional - pinched nerve in neck  . Hypertension   . Hypothyroidism   . Thyroid disease   . Wears  dentures    full upper, partial lower     Past Surgical History:  Procedure Laterality Date  . BOWEL RESECTION  01/17/2018   Procedure: SMALL BOWEL RESECTION;  Surgeon: Jules Husbands, MD;  Location: [facility] ORS;  Service: General;;  . BREAST SURGERY Bilateral 1991   mastectomy  . CATARACT EXTRACTION Bilateral   . CHOLECYSTECTOMY    . COLONOSCOPY  2013   Dr Gustavo Lah  . HALLUX VALGUS AUSTIN Right 06/03/2015   Procedure: HALLUX VALGUS AUSTIN;  Surgeon: Samara Deist, DPM;  Location: [place];  Service: Podiatry;  Laterality: Right;  WITH POPLITEAL  . KNEE ARTHROSCOPY WITH MEDIAL MENISECTOMY Right 08/30/2016   Procedure: KNEE ARTHROSCOPY WITH PARTIAL MEDIAL MENISECTOMY and Partial Lateral Menisectomy;  Surgeon: Hessie Knows, MD;  Location: [facility] OR[facility];  Service: Orthopedics;  Laterality: Right;  . KNEE ARTHROSCOPY WITH SUBCHONDROPLASTY Right 08/30/2016   Procedure: KNEE ARTHROSCOPY WITH SUBCHONDROPLASTY;  Surgeon: Hessie Knows, MD;  Location: [facility];  Service: Orthopedics;  Laterality: Right;  . LAPAROTOMY N/A 01/17/2018   Procedure: EXPLORATORY LAPAROTOMY;  Surgeon: Jules Husbands, MD;  Location: [facility] OR[facility];  Service: General;  Laterality: N/A;  . MASTECTOMY Bilateral   . ROTATOR CUFF REPAIR Bilateral   . TONSILLECTOMY    . TOTAL KNEE ARTHROPLASTY Left   . TOTAL KNEE ARTHROPLASTY Right 05/30/2017   Procedure: TOTAL KNEE ARTHROPLASTY;  Surgeon: Hessie Knows, MD;  Location: [facility];  Service: Orthopedics;  Laterality: Right;  Marland Kitchen VAGINAL HYSTERECTOMY      Social History   Socioeconomic History  . Marital status: Soil scientist    Spouse name: Not on file  . Number of children: 1  . Years of education: Not on file  . Highest education level: 12th grade  Occupational History  . Occupation: Retired  Scientific laboratory technician  . Financial resource strain: Not hard at all  . Food insecurity:    Worry: Never true    Inability: Never true  . Transportation needs:    Medical: No     Non-medical: No  Tobacco Use  . Smoking status: Former Smoker    Packs/day: 0.25    Years: 10.00    Pack years: 2.50    Types: Cigarettes    Last attempt to quit: 1991    Years since quitting: 28.8  . Smokeless tobacco: Never Used  . Tobacco comment: smoking cessation materials not required  Substance and Sexual Activity  . Alcohol use: No    Alcohol/week: 0.0 standard drinks  . Drug use: No  . Sexual activity: Not Currently  Lifestyle  . Physical activity:    Days per week: 2 days    Minutes per session: 60 min  . Stress: Not at all  Relationships  . Social connections:    Talks on phone: Patient refused    Gets together: Patient refused    Attends religious service: Patient refused    Active member of club or organization: Patient refused    Attends meetings of clubs or organizations: Patient refused    Relationship status: Patient refused  . Intimate partner violence:    Fear of current or ex partner: No    Emotionally abused: No    Physically abused: No    Forced sexual activity: No  Other Topics Concern  . Not on file  Social History Narrative  .  Not on file    Family History  Problem Relation Age of Onset  . Pulmonary embolism Mother   . Healthy Father      Current Outpatient Medications:  .  amLODipine (NORVASC) 5 MG tablet, Take 5 mg by mouth daily., Disp: , Rfl: 3 .  omeprazole (PRILOSEC) 20 MG capsule, Take 20 mg by mouth daily., Disp: , Rfl:  .  oxyCODONE (OXY IR/ROXICODONE) 5 MG immediate release tablet, Take 1-2 tablets (5-10 mg total) by mouth every 6 (six) hours as needed for moderate pain or severe pain., Disp: 25 tablet, Rfl: 0 .  acetaminophen (TYLENOL) 325 MG tablet, Take 1-2 tablets (325-650 mg total) by mouth every 6 (six) hours as needed for mild pain (pain score 1-3 or temp > 100.5). (Patient not taking: Reported on 01/26/2018), Disp: , Rfl:  .  feeding supplement, ENSURE ENLIVE, (ENSURE ENLIVE) LIQD, Take 237 mLs by mouth 2 (two) times daily  between meals. (Patient not taking: Reported on 01/26/2018), Disp: 237 mL, Rfl: 12  Physical exam:  Vitals:   01/26/18 0847  BP: (!) 144/87  Pulse: 67  Resp: 18  Temp: (!) 97.3 F (36.3 C)  TempSrc: Tympanic  SpO2: 98%  Weight: 199 lb (90.3 kg)  Height: '5\' 5"'$  (1.651 m)   Physical Exam  Constitutional: She is oriented to person, place, and time. She appears well-developed and well-nourished.  HENT:  Head: Normocephalic and atraumatic.  Eyes: Pupils are equal, round, and reactive to light. EOM are normal.  Neck: Normal range of motion.  Cardiovascular: Normal rate, regular rhythm and normal heart sounds.  Pulmonary/Chest: Effort normal and breath sounds normal.  Abdominal:  Midline scar of recent surgery is healing well and is well opposed.  No signs of inflammation.  Surgical staples in place and overlying dressing  Neurological: She is alert and oriented to person, place, and time.  Skin: Skin is warm and dry.     CMP Latest Ref Rng & Units 01/19/2018  Glucose 70 - 99 mg/dL 96  BUN 8 - 23 mg/dL 26(H)  Creatinine 0.44 - 1.00 mg/dL 1.01(H)  Sodium 135 - 145 mmol/L 139  Potassium 3.5 - 5.1 mmol/L 3.2(L)  Chloride 98 - 111 mmol/L 107  CO2 22 - 32 mmol/L 25  Calcium 8.9 - 10.3 mg/dL 8.5(L)  Total Protein 6.5 - 8.1 g/dL -  Total Bilirubin 0.3 - 1.2 mg/dL -  Alkaline Phos 38 - 126 U/L -  AST 15 - 41 U/L -  ALT 0 - 44 U/L -   CBC Latest Ref Rng & Units 01/18/2018  WBC 4.0 - 10.5 K/uL 9.0  Hemoglobin 12.0 - 15.0 g/dL 12.0  Hematocrit 36.0 - 46.0 % 36.3  Platelets 150 - 400 K/uL 138(L)    No images are attached to the encounter.  Mr Abdomen W Wo Contrast  Result Date: 01/16/2018 CLINICAL DATA:  Pancreatic adenocarcinoma. EXAM: MRI ABDOMEN WITHOUT AND WITH CONTRAST TECHNIQUE: Multiplanar multisequence MR imaging of the abdomen was performed both before and after the administration of intravenous contrast. CONTRAST:  9 cc Gadavist. COMPARISON:  01/15/2018 FINDINGS: Lower  chest: No pleural effusion identified. Hepatobiliary: Hepatic steatosis. The liver has a diffusely irregular contour with relative hypertrophy of the lateral segment and caudate lobe of liver. Imaging findings compatible with cirrhosis. No hyperenhancing liver lesions identified. No abnormal areas of washout identified Pancreas: There is no main duct dilatation or pancreatic inflammation identified. There are several cystic lesions identified within the head of pancreas these appear  unilocular without internal septation or nodularity. The largest measures 1.8 cm, image 22/4. Several smaller T2 hyperintense cystic foci are noted within the body and tail of pancreas measuring up to 5 mm. No solid enhancing pancreatic mass lesion identified. Spleen: The spleen measures 11.7 cm in cranial caudal dimension. No focal splenic abnormality. Adrenals/Urinary Tract: Normal appearance of the adrenal glands. Bilateral renal cortical scarring is identified right greater than left. T1 hyperintense lesion arising from the posterior cortex of the left kidney measures 1.2 cm, image 44/17. There is no internal enhancement identified within this structure. Scattered T2 hyperintense lesions within both kidneys are again noted. These measure less than 1 cm and are too small to reliably characterize. No solid enhancing mass or hydronephrosis noted. Stomach/Bowel: The stomach appears unremarkable. There is a duodenal diverticula within the left upper quadrant of the abdomen measuring 3.6 cm, image 21/3. The right lower quadrant small bowel mass is better imaged on CT from 01/15/2018. Vascular/Lymphatic: Aortic atherosclerosis. No aneurysm. No retroperitoneal adenopathy. Right lower quadrant soft tissue mass is better seen on CT from 01/15/2018. Other:  No ascites. Musculoskeletal: No suspicious bone lesions identified. IMPRESSION: 1. Cystic lesions within the head body and tail of pancreas are again noted. Findings are favored to represent  either pancreatic pseudocyst, or cystic neoplasm of the pancreas. Follow-up imaging in 12 months with repeat MRI of the pancreas is advised. 2. Cirrhosis of the liver.  No focal liver lesions noted. 3. Right lower quadrant lesions identified on recent CT are better seen on 05/15/2017. 4. Duodenal diverticula Electronically Signed   By: Kerby Moors M.D.   On: 01/16/2018 15:52   Ct Abdomen Pelvis W Contrast  Result Date: 01/15/2018 CLINICAL DATA:  Epigastric pain starting last night. Nausea. Prior cholecystectomy and hysterectomy. EXAM: CT ABDOMEN AND PELVIS WITH CONTRAST TECHNIQUE: Multidetector CT imaging of the abdomen and pelvis was performed using the standard protocol following bolus administration of intravenous contrast. CONTRAST:  72m OMNIPAQUE IOHEXOL 300 MG/ML SOLN, 102mOMNIPAQUE IOHEXOL 300 MG/ML SOLN COMPARISON:  None. FINDINGS: Lower chest: Coronary artery calcification is evident. Hepatobiliary: Nodular hepatic contour is consistent with cirrhosis. Gallbladder surgically absent. No intrahepatic or extrahepatic biliary dilation. Pancreas: 17 x 11 mm cystic lesion identified in the head of the pancreas (33/2). Adjacent 12 mm pancreatic head cyst identified on image 31/series 2. No dilatation of the main pancreatic duct. Spleen: No splenomegaly. No focal mass lesion. Adrenals/Urinary Tract: No adrenal nodule or mass. Cortical scarring noted in the kidneys bilaterally, right greater than left with tiny bilateral nonobstructing renal stones. 14 mm exophytic lesion in the upper pole of the left kidney has attenuation higher than would be expected for a simple cyst. 9 mm low-density lesion in the upper pole the right kidney is too small to characterize. No evidence for hydroureter. The urinary bladder appears normal for the degree of distention. Stomach/Bowel: Stomach is nondistended. No gastric wall thickening. No evidence of outlet obstruction. Duodenum is normally positioned as is the ligament of  Treitz. No small bowel dilatation. There is a segment of small bowel in the right lower quadrant (69/2) head appears mildly distended and shows some wall thickening. Just cranial to and contiguous with this abnormal small bowel segment is a 3.3 x 2.1 x 3.9 cm necrotic appearing soft tissue lesion, potentially a lymph node. This is well demonstrated on coronal image 43 of series 5. Other adjacent smaller lymph nodes are evident. The terminal ileum is normal. The appendix is not visualized, but there  is no edema or inflammation in the region of the cecum. Diverticuli are seen scattered along the entire length of the colon without CT findings of diverticulitis. Vascular/Lymphatic: There is abdominal aortic atherosclerosis without aneurysm. No gastrohepatic or hepatoduodenal ligament lymphadenopathy. No retroperitoneal lymphadenopathy. No pelvic sidewall lymphadenopathy. Reproductive: Uterus surgically absent.  There is no adnexal mass. Other: No intraperitoneal free fluid. Musculoskeletal: No worrisome lytic or sclerotic osseous abnormality. IMPRESSION: 1. Abnormal small bowel loop in the right lower quadrant with an adjacent 4 cm necrotic mesenteric lesion, potentially a lymph node. Other smaller lymph nodes are seen in the adjacent mesentery. There is no associated edema or inflammation to suggest that this represents inflammatory bowel disease. Imaging features are not characteristic for diverticulum. As such, small-bowel neoplasm, including carcinoid is a consideration. Lymphoma also possible. 2. Adjacent small hypoattenuating lesions in the pancreatic head appears cystic. These do not have classic appearance for adenocarcinoma and there is no associated ductal dilatation. However given the findings described immediately above, MRI of the pancreas without with contrast suggested to further evaluate. 3. Nodular hepatic contour is compatible with cirrhosis. 4. Bilateral renal cortical scarring with bilateral tiny  nonobstructing renal stones. 14 mm exophytic lesion upper pole left kidney cannot be definitively characterized. If abdominal MRIs performed, attention to the left kidney recommended. Otherwise surveillance may be warranted. Electronically Signed   By: Misty Stanley M.D.   On: 01/15/2018 20:36     Assessment and plan- Patient is a 84 y.o. 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  Patient found to have a well-differentiated neuroendocrine carcinoma of the.  It was grade 1 and Ki-67 was less than 3%.  Margins are negative.  She therefore does not require any adjuvant treatment for this at this time.  Discussed etiology and natural history of neuroendocrine tumor.  I will obtain a baseline chromogranin A as well as 24-hour urine 5 HIAA.  I will obtain a baseline gallium PET scan to make sure that there is no evidence of metastatic disease.  Discussed that there is no role for routine surveillance imaging for neuroendocrine tumors but we could consider that for the first 2 years.  I will see her back tentatively in 3 weeks time to discuss the results of her dotatate scan as well as blood work.  Patient also found to have pancreatic cysts and will need an MRI abdomen with and without contrast in 1 years time for surveillance  Cancer Staging Small bowel tumor Staging form: Small Intestine - Other Histologies, AJCC 8th Edition - Pathologic stage from 01/26/2018: pT3, pN2, cM0 - Signed by Sindy Guadeloupe, MD on 01/26/2018     Visit Diagnosis 1. Neuroendocrine carcinoma of small bowel ([place])   2. Goals of care, counseling/discussion      Dr. Randa Evens, MD, MPH [facility] at [facility] [phone] 01/26/2018 12:28 PM

## 2018-01-29 ENCOUNTER — Ambulatory Visit (INDEPENDENT_AMBULATORY_CARE_PROVIDER_SITE_OTHER): Payer: Medicare HMO | Admitting: Surgery

## 2018-01-29 ENCOUNTER — Encounter: Payer: Self-pay | Admitting: Surgery

## 2018-01-29 ENCOUNTER — Other Ambulatory Visit: Payer: Self-pay

## 2018-01-29 VITALS — BP 168/88 | HR 67 | Temp 98.2°F | Resp 13 | Ht 65.0 in | Wt 198.2 lb

## 2018-01-29 DIAGNOSIS — C7A8 Other malignant neuroendocrine tumors: Secondary | ICD-10-CM

## 2018-01-29 NOTE — Patient Instructions (Signed)
Patient needs to follow up as needed. Call the office with any questions or concerns.

## 2018-01-29 NOTE — Progress Notes (Signed)
S/p sb resection for neuroendocrine tumor SB Path d/w pt in detail + PO, + BM Doing very well  PE NAD Abd: soft, nt, incision c/d/i, staples removed. nO infection  A/p Doing very well Await oncology recs Pet/ct to be done in the near future No surgical issues at this time

## 2018-01-30 LAB — CHROMOGRANIN A: Chromogranin A: 5 nmol/L (ref 0–5)

## 2018-01-31 DIAGNOSIS — I10 Essential (primary) hypertension: Secondary | ICD-10-CM | POA: Diagnosis not present

## 2018-01-31 DIAGNOSIS — Z9010 Acquired absence of unspecified breast and nipple: Secondary | ICD-10-CM | POA: Diagnosis not present

## 2018-01-31 DIAGNOSIS — C7A8 Other malignant neuroendocrine tumors: Secondary | ICD-10-CM | POA: Diagnosis not present

## 2018-01-31 DIAGNOSIS — R69 Illness, unspecified: Secondary | ICD-10-CM | POA: Diagnosis not present

## 2018-01-31 DIAGNOSIS — Z87891 Personal history of nicotine dependence: Secondary | ICD-10-CM | POA: Diagnosis not present

## 2018-01-31 DIAGNOSIS — Z853 Personal history of malignant neoplasm of breast: Secondary | ICD-10-CM | POA: Diagnosis not present

## 2018-01-31 DIAGNOSIS — E039 Hypothyroidism, unspecified: Secondary | ICD-10-CM | POA: Diagnosis not present

## 2018-02-01 ENCOUNTER — Other Ambulatory Visit: Payer: Self-pay

## 2018-02-01 DIAGNOSIS — C7A8 Other malignant neuroendocrine tumors: Secondary | ICD-10-CM

## 2018-02-02 ENCOUNTER — Encounter: Payer: Self-pay | Admitting: Family Medicine

## 2018-02-02 ENCOUNTER — Ambulatory Visit (INDEPENDENT_AMBULATORY_CARE_PROVIDER_SITE_OTHER): Payer: Medicare HMO | Admitting: Family Medicine

## 2018-02-02 VITALS — BP 120/82 | HR 68 | Ht 65.0 in | Wt 198.0 lb

## 2018-02-02 DIAGNOSIS — Z09 Encounter for follow-up examination after completed treatment for conditions other than malignant neoplasm: Secondary | ICD-10-CM

## 2018-02-02 NOTE — Progress Notes (Signed)
Date:  02/02/2018   Name:  Kaitlyn Oliver   DOB:  11/06/1933   MRN:  [id number]   Chief Complaint: Follow-up (follow up- d/c 11/8- had a section of bowel removed- still having diarrhea) Pt was recently admitted to [facility] on 01/16/18 for abdominal painand was discharged on 01/19/18. Transition of care call placed on 01/22/18.     Review of Systems  Constitutional: Negative.  Negative for chills, fatigue, fever and unexpected weight change.  HENT: Negative for congestion, ear discharge, ear pain, rhinorrhea, sinus pressure, sneezing and sore throat.   Eyes: Negative for photophobia, pain, discharge, redness and itching.  Respiratory: Negative for cough, shortness of breath, wheezing and stridor.   Gastrointestinal: Negative for abdominal pain, blood in stool, constipation, diarrhea, nausea and vomiting.  Endocrine: Negative for cold intolerance, heat intolerance, polydipsia, polyphagia and polyuria.  Genitourinary: Negative for dysuria, flank pain, frequency, hematuria, menstrual problem, pelvic pain, urgency, vaginal bleeding and vaginal discharge.  Musculoskeletal: Negative for arthralgias, back pain and myalgias.  Skin: Negative for rash.  Allergic/Immunologic: Negative for environmental allergies and food allergies.  Neurological: Negative for dizziness, weakness, light-headedness, numbness and headaches.  Hematological: Negative for adenopathy. Does not bruise/bleed easily.  Psychiatric/Behavioral: Negative for dysphoric mood. The patient is not nervous/anxious.     Patient Active Problem List   Diagnosis Date Noted  . 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 Goals of care, counseling/discussion 01/26/2018  . Small bowel tumor   . Pancreatic cyst 01/15/2018  . Anxiety and depression 01/15/2018  . Pancreas cyst 01/15/2018  . Bowel obstruction ([place]) 11/29/2017  . Small bowel obstruction ([place])   . Thoracoabdominal aortic aneurysm (TAAA) ([place])  11/19/2017  . Status post total knee replacement using cement, right 05/30/2017  . GERD (gastroesophageal reflux disease) 12/22/2015  . Adenomatous polyp 09/11/2014  . Bilateral cataracts 09/11/2014  . Gastric catarrh 09/11/2014  . Gastroduodenal ulcer 09/11/2014  . Hypothyroidism 09/11/2014  . Cardiac murmur 10/08/2013  . Essential (primary) hypertension 10/08/2013  . Combined fat and carbohydrate induced hyperlipemia 10/08/2013    Allergies  Allergen Reactions  . Adhesive [Tape] Dermatitis    Can not use plastic tape. Paper tape ok   . Penicillin G Itching    Has patient had a PCN reaction causing immediate rash, facial/tongue/throat swelling, SOB or lightheadedness with hypotension: No Has patient had a PCN reaction causing severe rash involving mucus membranes or skin necrosis: No Has patient had a PCN reaction that required hospitalization: No Has patient had a PCN reaction occurring within the last 10 years: No If all of the above answers are "NO", then may proceed with Cephalosporin use.   Marland Kitchen Hydrocodone-Acetaminophen Itching and Rash    Past Surgical History:  Procedure Laterality Date  . BOWEL RESECTION  01/17/2018   Procedure: SMALL BOWEL RESECTION;  Surgeon: Jules Husbands, MD;  Location: [facility];  Service: General;;  . BREAST SURGERY Bilateral 1991   mastectomy  . CATARACT EXTRACTION Bilateral   . CHOLECYSTECTOMY    . COLONOSCOPY  2013   Dr Gustavo Lah  . HALLUX VALGUS AUSTIN Right 06/03/2015   Procedure: HALLUX VALGUS AUSTIN;  Surgeon: Samara Deist, DPM;  Location: [facility];  Service: Podiatry;  Laterality: Right;  WITH POPLITEAL  . KNEE ARTHROSCOPY WITH MEDIAL MENISECTOMY Right 08/30/2016   Procedure: KNEE ARTHROSCOPY WITH PARTIAL MEDIAL MENISECTOMY and Partial Lateral Menisectomy;  Surgeon: Hessie Knows, MD;  Location: [facility];  Service: Orthopedics;  Laterality: Right;  . KNEE  ARTHROSCOPY WITH SUBCHONDROPLASTY Right 08/30/2016   Procedure: KNEE  ARTHROSCOPY WITH SUBCHONDROPLASTY;  Surgeon: Hessie Knows, MD;  Location: [facility];  Service: Orthopedics;  Laterality: Right;  . LAPAROTOMY N/A 01/17/2018   Procedure: EXPLORATORY LAPAROTOMY;  Surgeon: Jules Husbands, MD;  Location: [facility];  Service: General;  Laterality: N/A;  . MASTECTOMY Bilateral   . ROTATOR CUFF REPAIR Bilateral   . TONSILLECTOMY    . TOTAL KNEE ARTHROPLASTY Left   . TOTAL KNEE ARTHROPLASTY Right 05/30/2017   Procedure: TOTAL KNEE ARTHROPLASTY;  Surgeon: Hessie Knows, MD;  Location: [facility];  Service: Orthopedics;  Laterality: Right;  Marland Kitchen VAGINAL HYSTERECTOMY      Social History   Tobacco Use  . Smoking status: Former Smoker    Packs/day: 0.25    Years: 10.00    Pack years: 2.50    Types: Cigarettes    Last attempt to quit: 1991    Years since quitting: 28.9  . Smokeless tobacco: Never Used  . Tobacco comment: smoking cessation materials not required  Substance Use Topics  . Alcohol use: No    Alcohol/week: 0.0 standard drinks  . Drug use: No     Medication list has been reviewed and updated.  Current Meds  Medication Sig  . acetaminophen (TYLENOL) 325 MG tablet Take 1-2 tablets (325-650 mg total) by mouth every 6 (six) hours as needed for mild pain (pain score 1-3 or temp > 100.5).  Marland Kitchen amLODipine (NORVASC) 5 MG tablet Take 5 mg by mouth daily.  . feeding supplement, ENSURE ENLIVE, (ENSURE ENLIVE) LIQD Take 237 mLs by mouth 2 (two) times daily between meals.  Marland Kitchen omeprazole (PRILOSEC) 20 MG capsule Take 20 mg by mouth daily.    PHQ 2/9 Scores 02/02/2018 08/15/2017 04/06/2017 06/07/2016  PHQ - 2 Score 0 0 0 0  PHQ- 9 Score 0 1 - -    Physical Exam  Constitutional: No distress.  HENT:  Head: Normocephalic and atraumatic.  Right Ear: External ear normal.  Left Ear: External ear normal.  Nose: Nose normal.  Mouth/Throat: Oropharynx is clear and moist.  Eyes: Pupils are equal, round, and reactive to light. Conjunctivae and EOM are normal. Right eye  exhibits no discharge. Left eye exhibits no discharge.  Neck: Normal range of motion. Neck supple. No JVD present. No thyromegaly present.  Cardiovascular: Normal rate, regular rhythm, normal heart sounds and intact distal pulses. Exam reveals no gallop and no friction rub.  No murmur heard. Pulmonary/Chest: Effort normal and breath sounds normal. She has no wheezes. She has no rales. She exhibits no tenderness.  Abdominal: Soft. Bowel sounds are normal. She exhibits no mass. There is no tenderness. There is no guarding.  Musculoskeletal: Normal range of motion. She exhibits no edema.  Lymphadenopathy:    She has no cervical adenopathy.  Neurological: She is alert. She has normal reflexes.  Skin: Skin is warm and dry. She is not diaphoretic.  Nursing note and vitals reviewed.   BP 120/82   Pulse 68   Ht 5\' 5"  (1.651 m)   Wt 198 lb (89.8 kg)   BMI 32.95 kg/m   Assessment and Plan:  1. 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  Patient is being followed by oncology at this time.  Patient is stable with no pain or issues at this time.   Dr. [facility]  02/02/2018

## 2018-02-05 DIAGNOSIS — I2510 Atherosclerotic heart disease of native coronary artery without angina pectoris: Secondary | ICD-10-CM | POA: Diagnosis not present

## 2018-02-05 DIAGNOSIS — I712 Thoracic aortic aneurysm, without rupture: Secondary | ICD-10-CM | POA: Diagnosis not present

## 2018-02-05 DIAGNOSIS — E782 Mixed hyperlipidemia: Secondary | ICD-10-CM | POA: Diagnosis not present

## 2018-02-05 DIAGNOSIS — I10 Essential (primary) hypertension: Secondary | ICD-10-CM | POA: Diagnosis not present

## 2018-02-05 LAB — 5 HIAA, QUANTITATIVE, URINE, 24 HOUR
5 HIAA UR: 4.2 mg/L
5-HIAA,QUANT.,24 HR URINE: 2.3 mg/(24.h) (ref 0.0–14.9)
Total Volume: 550

## 2018-02-06 ENCOUNTER — Telehealth: Payer: Self-pay

## 2018-02-06 NOTE — Telephone Encounter (Signed)
Spoke with the patient to inform her that the PET scan has been approved and [facility] arrive at 10:00 AM for a 10:30 appointment. The patient was also informed the she is not to eat anything 2 prior to the visit, but ok to drink plenty of water. The patient was agreeable and understanding to keep schedule appointment.

## 2018-02-09 DIAGNOSIS — K219 Gastro-esophageal reflux disease without esophagitis: Secondary | ICD-10-CM | POA: Diagnosis not present

## 2018-02-09 DIAGNOSIS — N281 Cyst of kidney, acquired: Secondary | ICD-10-CM | POA: Diagnosis not present

## 2018-02-09 DIAGNOSIS — M1990 Unspecified osteoarthritis, unspecified site: Secondary | ICD-10-CM | POA: Diagnosis not present

## 2018-02-09 DIAGNOSIS — C179 Malignant neoplasm of small intestine, unspecified: Secondary | ICD-10-CM | POA: Diagnosis not present

## 2018-02-09 DIAGNOSIS — Z853 Personal history of malignant neoplasm of breast: Secondary | ICD-10-CM | POA: Diagnosis not present

## 2018-02-09 DIAGNOSIS — Z483 Aftercare following surgery for neoplasm: Secondary | ICD-10-CM | POA: Diagnosis not present

## 2018-02-09 DIAGNOSIS — E039 Hypothyroidism, unspecified: Secondary | ICD-10-CM | POA: Diagnosis not present

## 2018-02-09 DIAGNOSIS — R69 Illness, unspecified: Secondary | ICD-10-CM | POA: Diagnosis not present

## 2018-02-09 DIAGNOSIS — I10 Essential (primary) hypertension: Secondary | ICD-10-CM | POA: Diagnosis not present

## 2018-02-14 ENCOUNTER — Ambulatory Visit
Admission: RE | Admit: 2018-02-14 | Discharge: 2018-02-14 | Disposition: A | Discharge status: Home / Self Care | Payer: Medicare HMO | Source: Ambulatory Visit | Attending: Oncology | Admitting: Oncology

## 2018-02-14 DIAGNOSIS — R918 Other nonspecific abnormal finding of lung field: Secondary | ICD-10-CM | POA: Insufficient documentation

## 2018-02-14 DIAGNOSIS — Z9049 Acquired absence of other specified parts of digestive tract: Secondary | ICD-10-CM | POA: Diagnosis not present

## 2018-02-14 DIAGNOSIS — C7A8 Other malignant neuroendocrine tumors: Secondary | ICD-10-CM | POA: Diagnosis not present

## 2018-02-14 DIAGNOSIS — C7A019 Malignant carcinoid tumor of the small intestine, unspecified portion: Secondary | ICD-10-CM | POA: Diagnosis not present

## 2018-02-14 MED ORDER — GALLIUM GA 68 DOTATATE IV KIT
5.0000 | PACK | Freq: Once | INTRAVENOUS | Status: AC
  Administered 2018-02-14: 5.99 via INTRAVENOUS

## 2018-02-16 ENCOUNTER — Encounter: Payer: Self-pay | Admitting: Oncology

## 2018-02-16 ENCOUNTER — Inpatient Hospital Stay: Payer: Medicare HMO | Attending: Oncology | Admitting: Oncology

## 2018-02-16 ENCOUNTER — Other Ambulatory Visit: Payer: Self-pay

## 2018-02-16 VITALS — BP 157/81 | HR 60 | Temp 96.9°F | Resp 18 | Wt 196.2 lb

## 2018-02-16 DIAGNOSIS — C7A8 Other malignant neuroendocrine tumors: Secondary | ICD-10-CM

## 2018-02-16 DIAGNOSIS — Z79899 Other long term (current) drug therapy: Secondary | ICD-10-CM | POA: Diagnosis not present

## 2018-02-16 DIAGNOSIS — Z87891 Personal history of nicotine dependence: Secondary | ICD-10-CM | POA: Insufficient documentation

## 2018-02-16 NOTE — Progress Notes (Signed)
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 Patient also was admitted for an episode of bowel obstruction about 2 months ago which resolved conservatively. CT abdomen done during this admission showed an abnormal small bowel loop in the right lower quadrant and adjacent 4 cm necrotic mesenteric lesion which could be a potential lymph node. Patient also noted to have hypoattenuating cystic lesions in the head of the pancreas. Hepatic cirrhosis. This was followed by MRI of the abdomen with and without contrast which again showed multiple cystic lesions in the pancreas either pancreatic pseudocyst or cystic neoplasm of the pancreas.   Patient had exploratory laparotomy and resection of the small bowel tumor.  Pathology showed well-differentiated neuroendocrine tumor grade 1, 3.7 cm mitotic rate less than 2 per 10 high-power fields.  Ki-67 less than 3%.  Tumor invades through the muscularis propria into subserosal tissue.  All margins negative.  L VIN PNI present.  Large mesenteric mass greater than 2 cm present.  Regional lymph nodes 5 out of 14 were involved.  pT3N2  Interval history-reports occasional diarrhea since her abdominal surgery which is gradually improving.  Denies other complaints at this time  ECOG PS- 1 Pain scale- 0 Opioid associated constipation-  no  Review of systems- Review of Systems  Constitutional: Negative for chills, fever, malaise/fatigue and weight loss.  HENT: Negative for congestion, ear discharge and nosebleeds.   Eyes: Negative for blurred vision.  Respiratory: Negative for cough, hemoptysis, sputum production, shortness of breath and wheezing.   Cardiovascular: Negative for chest pain, palpitations, orthopnea and claudication.  Gastrointestinal: Positive for diarrhea. Negative for abdominal pain, blood in stool, constipation, heartburn, melena, nausea and vomiting.  Genitourinary: Negative for dysuria, flank pain, frequency, hematuria and urgency.  Musculoskeletal: Negative for back pain, joint pain and myalgias.  Skin: Negative for rash.  Neurological: Negative for dizziness, tingling, focal weakness, seizures, weakness and headaches.  Endo/Heme/Allergies: Does not bruise/bleed easily.  Psychiatric/Behavioral: Negative for depression and suicidal ideas. The patient does not have insomnia.        Allergies  Allergen Reactions  . Adhesive [Tape] Dermatitis    Can not use plastic tape. Paper tape ok   . Penicillin G Itching    Has patient had a PCN reaction causing immediate rash, facial/tongue/throat swelling, SOB or lightheadedness with hypotension: No Has patient had a PCN reaction causing severe rash involving mucus membranes or skin necrosis: No Has patient had a PCN reaction that required hospitalization: No Has patient had a PCN reaction occurring within the last 10 years: No If all of the above answers are "NO", then may proceed with Cephalosporin use.   Marland Kitchen Hydrocodone-Acetaminophen Itching and Rash     Past Medical History:  Diagnosis Date  . Anxiety   . Arthritis    thumbs, hands  . Cancer [facility]) 1991   bilateral breast  . Depression   . GERD (gastroesophageal reflux disease)   . Headache  occasional - pinched nerve in neck  . Hypertension   . Hypothyroidism   . Thyroid disease   . Wears  dentures    full upper, partial lower     Past Surgical History:  Procedure Laterality Date  . BOWEL RESECTION  01/17/2018   Procedure: SMALL BOWEL RESECTION;  Surgeon: Jules Husbands, MD;  Location: [facility];  Service: General;;  . BREAST SURGERY Bilateral 1991   mastectomy  . CATARACT EXTRACTION Bilateral   . CHOLECYSTECTOMY    . COLONOSCOPY  2013   Dr Gustavo Lah  . HALLUX VALGUS AUSTIN Right 06/03/2015   Procedure: HALLUX VALGUS AUSTIN;  Surgeon: Samara Deist, DPM;  Location: [place];  Service: Podiatry;  Laterality: Right;  WITH POPLITEAL  . KNEE ARTHROSCOPY WITH MEDIAL MENISECTOMY Right 08/30/2016   Procedure: KNEE ARTHROSCOPY WITH PARTIAL MEDIAL MENISECTOMY and Partial Lateral Menisectomy;  Surgeon: Hessie Knows, MD;  Location: [facility];  Service: Orthopedics;  Laterality: Right;  . KNEE ARTHROSCOPY WITH SUBCHONDROPLASTY Right 08/30/2016   Procedure: KNEE ARTHROSCOPY WITH SUBCHONDROPLASTY;  Surgeon: Hessie Knows, MD;  Location: [facility];  Service: Orthopedics;  Laterality: Right;  . LAPAROTOMY N/A 01/17/2018   Procedure: EXPLORATORY LAPAROTOMY;  Surgeon: Jules Husbands, MD;  Location: [facility];  Service: General;  Laterality: N/A;  . MASTECTOMY Bilateral   . ROTATOR CUFF REPAIR Bilateral   . TONSILLECTOMY    . TOTAL KNEE ARTHROPLASTY Left   . TOTAL KNEE ARTHROPLASTY Right 05/30/2017   Procedure: TOTAL KNEE ARTHROPLASTY;  Surgeon: Hessie Knows, MD;  Location: [facility];  Service: Orthopedics;  Laterality: Right;  Marland Kitchen VAGINAL HYSTERECTOMY      Social History   Socioeconomic History  . Marital status: Soil scientist    Spouse name: Not on file  . Number of children: 1  . Years of education: Not on file  . Highest education level: 12th grade  Occupational History  . Occupation: Retired  Scientific laboratory technician  . Financial resource strain: Not hard at all  . Food insecurity:    Worry: Never true    Inability: Never true  . Transportation needs:    Medical: No     Non-medical: No  Tobacco Use  . Smoking status: Former Smoker    Packs/day: 0.25    Years: 10.00    Pack years: 2.50    Types: Cigarettes    Last attempt to quit: 1991    Years since quitting: 28.9  . Smokeless tobacco: Never Used  . Tobacco comment: smoking cessation materials not required  Substance and Sexual Activity  . Alcohol use: No    Alcohol/week: 0.0 standard drinks  . Drug use: No  . Sexual activity: Not Currently  Lifestyle  . Physical activity:    Days per week: 2 days    Minutes per session: 60 min  . Stress: Not at all  Relationships  . Social connections:    Talks on phone: Patient refused    Gets together: Patient refused    Attends religious service: Patient refused    Active member of club or organization: Patient refused    Attends meetings of clubs or organizations: Patient refused    Relationship status: Patient refused  . Intimate partner violence:    Fear of current or ex partner: No    Emotionally abused: No    Physically abused: No    Forced sexual activity: No  Other Topics Concern  . Not on file  Social History Narrative  . Not on file  Family History  Problem Relation Age of Onset  . Pulmonary embolism Mother   . Healthy Father      Current Outpatient Medications:  .  amLODipine (NORVASC) 5 MG tablet, Take 5 mg by mouth daily., Disp: , Rfl: 3 .  omeprazole (PRILOSEC) 20 MG capsule, Take 20 mg by mouth daily., Disp: , Rfl:  .  acetaminophen (TYLENOL) 325 MG tablet, Take 1-2 tablets (325-650 mg total) by mouth every 6 (six) hours as needed for mild pain (pain score 1-3 or temp > 100.5). (Patient not taking: Reported on 02/16/2018), Disp: , Rfl:  .  feeding supplement, ENSURE ENLIVE, (ENSURE ENLIVE) LIQD, Take 237 mLs by mouth 2 (two) times daily between meals. (Patient not taking: Reported on 02/16/2018), Disp: 237 mL, Rfl: 12 .  oxyCODONE (OXY IR/ROXICODONE) 5 MG immediate release tablet, Take 1-2 tablets (5-10 mg total) by mouth every 6  (six) hours as needed for moderate pain or severe pain. (Patient not taking: Reported on 02/02/2018), Disp: 25 tablet, Rfl: 0  Physical exam:  Vitals:   02/16/18 0955  BP: (!) 157/81  Pulse: 60  Resp: 18  Temp: (!) 96.9 F (36.1 C)  TempSrc: Tympanic  Weight: 196 lb 3.2 oz (89 kg)   Physical Exam  Constitutional: She is oriented to person, place, and time. She appears well-developed and well-nourished.  HENT:  Head: Normocephalic and atraumatic.  Eyes: Pupils are equal, round, and reactive to light. EOM are normal.  Neck: Normal range of motion.  Cardiovascular: Normal rate, regular rhythm and normal heart sounds.  Pulmonary/Chest: Effort normal and breath sounds normal.  Abdominal: Soft. Bowel sounds are normal.  Neurological: She is alert and oriented to person, place, and time.  Skin: Skin is warm and dry.     CMP Latest Ref Rng & Units 01/19/2018  Glucose 70 - 99 mg/dL 96  BUN 8 - 23 mg/dL 26(H)  Creatinine 0.44 - 1.00 mg/dL 1.01(H)  Sodium 135 - 145 mmol/L 139  Potassium 3.5 - 5.1 mmol/L 3.2(L)  Chloride 98 - 111 mmol/L 107  CO2 22 - 32 mmol/L 25  Calcium 8.9 - 10.3 mg/dL 8.5(L)  Total Protein 6.5 - 8.1 g/dL -  Total Bilirubin 0.3 - 1.2 mg/dL -  Alkaline Phos 38 - 126 U/L -  AST 15 - 41 U/L -  ALT 0 - 44 U/L -   CBC Latest Ref Rng & Units 01/18/2018  WBC 4.0 - 10.5 K/uL 9.0  Hemoglobin 12.0 - 15.0 g/dL 12.0  Hematocrit 36.0 - 46.0 % 36.3  Platelets 150 - 400 K/uL 138(L)    No images are attached to the encounter.  Nm Pet (netspot Ga 68 Dotatate) Skull Base To Mid Thigh  Result Date: 02/14/2018 CLINICAL DATA:  Neuroendocrine tumor of the small bowel. Small bowel resection 01/17/2018. EXAM: NUCLEAR MEDICINE PET SKULL BASE TO THIGH TECHNIQUE: 5.99 mCi Ga 70 DOTATATE was injected intravenously. Full-ring PET imaging was performed from the skull base to thigh after the radiotracer. CT data was obtained and used for attenuation correction and anatomic localization.  COMPARISON:  01/16/2018 FINDINGS: NECK No radiotracer activity in neck lymph nodes. Incidental CT findings: None CHEST No radiotracer accumulation within mediastinal or hilar lymph nodes. Small pulmonary nodule in the RIGHT lower lobe along the fissure measures 4 mm (image 94/3). No associated radiotracer activity. Second nodule at the LEFT lung base measuring 7 mm (image 99/3) without associated radiotracer activity. Incidental CT finding:None ABDOMEN/PELVIS Post a partial RIGHT hemicolectomy. No focal activity  in the RIGHT lower quadrant to suggest residual disease or metastatic adenopathy. No retroperitoneal periportal adenopathy to suggest distant metastatic disease. No focal activity in the liver to suggest hepatic metastasis. No focal activity within the small bowel or colon to localize primary neuroendocrine tumor. Physiologic activity noted in the liver, spleen, adrenal glands and kidneys. Incidental CT findings:Liver has a nodular contour suggesting underlying cirrhosis. Postcholecystectomy. Atherosclerotic calcification of the aorta. Post hysterectomy SKELETON No focal activity to suggest skeletal metastasis. Incidental CT findings:None IMPRESSION: 1. No focal activity in the RIGHT lower quadrant following partial RIGHT hemicolectomy to suggest residual well differentiated neuroendocrine tumor. 2. No lymph node activity to suggest nodal metastasis. 3. No evidence solid organ metastasis to liver or lungs. 4. Small pulmonary nodules without activity are favored unrelated. Non-contrast chest CT at 6-12 months is recommended. If the nodule is stable at time of repeat CT, then future CT at 18-24 months (from today's scan) is considered optional for low-risk patients, but is recommended for high-risk patients. This recommendation follows the consensus statement: Guidelines for Management of Incidental Pulmonary Nodules Detected on CT Images:From the Fleischner Society 2017; published online before print  (10.1148/radiol.[id number]). Electronically Signed   By: Suzy Bouchard M.D.   On: 02/14/2018 13:18     Assessment and plan- Patient is a 84 y.o. 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  Baseline chromogranin A as well as 24-hour urine HIAA was normal.  I will see her back in 6 months time with CT chest abdomen and pelvis with contrast prior along with CBC CMP and chromogranin A levels.  Given that she had pulmonary nodules this can be monitored as well with a repeat scan in 6 months   Visit Diagnosis 1. 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 Randa Evens, MD, MPH [facility] [phone] 02/16/2018 1:13 PM

## 2018-02-16 NOTE — Progress Notes (Signed)
Here for follow up. Stated feeling " sore " but no pain from abd surg on 01/17/18   Stated diarrhea improving and having one bm per day now .

## 2018-04-11 ENCOUNTER — Ambulatory Visit: Payer: Self-pay

## 2018-04-12 ENCOUNTER — Ambulatory Visit: Payer: Self-pay

## 2018-05-15 ENCOUNTER — Ambulatory Visit (INDEPENDENT_AMBULATORY_CARE_PROVIDER_SITE_OTHER): Payer: Medicare HMO | Admitting: Family Medicine

## 2018-05-15 ENCOUNTER — Encounter: Payer: Self-pay | Admitting: Family Medicine

## 2018-05-15 VITALS — BP 148/92 | HR 63 | Ht 65.0 in | Wt 194.0 lb

## 2018-05-15 DIAGNOSIS — R110 Nausea: Secondary | ICD-10-CM | POA: Diagnosis not present

## 2018-05-15 DIAGNOSIS — K7460 Unspecified cirrhosis of liver: Secondary | ICD-10-CM | POA: Diagnosis not present

## 2018-05-15 DIAGNOSIS — K279 Peptic ulcer, site unspecified, unspecified as acute or chronic, without hemorrhage or perforation: Secondary | ICD-10-CM

## 2018-05-15 DIAGNOSIS — I10 Essential (primary) hypertension: Secondary | ICD-10-CM

## 2018-05-15 DIAGNOSIS — K911 Postgastric surgery syndromes: Secondary | ICD-10-CM | POA: Diagnosis not present

## 2018-05-15 DIAGNOSIS — R197 Diarrhea, unspecified: Secondary | ICD-10-CM | POA: Diagnosis not present

## 2018-05-15 MED ORDER — HYDROCHLOROTHIAZIDE 12.5 MG PO TABS: 12.5000 mg | ORAL_TABLET | Freq: Every day | ORAL | 0 refills | Status: DC

## 2018-05-15 NOTE — Progress Notes (Signed)
Date:  05/15/2018   Name:  Kaitlyn Oliver   DOB:  Oct 21, 1933   MRN:  [id number]   Chief Complaint: Hypertension (175/105 at [facility])  Hypertension  This is a chronic problem. The current episode started 1 to 4 weeks ago. The problem has been waxing and waning since onset. The problem is uncontrolled. Pertinent negatives include no anxiety, blurred vision, chest pain, headaches, malaise/fatigue, neck pain, orthopnea, palpitations, peripheral edema, PND, shortness of breath or sweats. There are no associated agents to hypertension. Past treatments include calcium channel blockers. There are no compliance problems.     Review of Systems  Constitutional: Negative for chills, fever and malaise/fatigue.  HENT: Negative for drooling, ear discharge, ear pain and sore throat.   Eyes: Negative for blurred vision.  Respiratory: Negative for cough, shortness of breath and wheezing.   Cardiovascular: Negative for chest pain, palpitations, orthopnea, leg swelling and PND.  Gastrointestinal: Negative for abdominal pain, blood in stool, constipation, diarrhea and nausea.  Endocrine: Negative for polydipsia.  Genitourinary: Negative for dysuria, frequency, hematuria and urgency.  Musculoskeletal: Negative for back pain, myalgias and neck pain.  Skin: Negative for rash.  Allergic/Immunologic: Negative for environmental allergies.  Neurological: Negative for dizziness and headaches.  Hematological: Does not bruise/bleed easily.  Psychiatric/Behavioral: Negative for suicidal ideas. The patient is not nervous/anxious.     Patient Active Problem List   Diagnosis Date Noted  . 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 Goals of care, counseling/discussion 01/26/2018  . Small bowel tumor   . Pancreatic cyst 01/15/2018  . Anxiety and depression 01/15/2018  . Pancreas cyst 01/15/2018  . Bowel obstruction ([place]) 11/29/2017  . Small bowel obstruction ([place])   . Thoracoabdominal aortic  aneurysm (TAAA) ([place]) 11/19/2017  . Status post total knee replacement using cement, right 05/30/2017  . GERD (gastroesophageal reflux disease) 12/22/2015  . Adenomatous polyp 09/11/2014  . Bilateral cataracts 09/11/2014  . Gastric catarrh 09/11/2014  . Gastroduodenal ulcer 09/11/2014  . Hypothyroidism 09/11/2014  . Cardiac murmur 10/08/2013  . Essential (primary) hypertension 10/08/2013  . Combined fat and carbohydrate induced hyperlipemia 10/08/2013    Allergies  Allergen Reactions  . Adhesive [Tape] Dermatitis    Can not use plastic tape. Paper tape ok   . Penicillin G Itching    Has patient had a PCN reaction causing immediate rash, facial/tongue/throat swelling, SOB or lightheadedness with hypotension: No Has patient had a PCN reaction causing severe rash involving mucus membranes or skin necrosis: No Has patient had a PCN reaction that required hospitalization: No Has patient had a PCN reaction occurring within the last 10 years: No If all of the above answers are "NO", then may proceed with Cephalosporin use.   Marland Kitchen Hydrocodone-Acetaminophen Itching and Rash    Past Surgical History:  Procedure Laterality Date  . BOWEL RESECTION  01/17/2018   Procedure: SMALL BOWEL RESECTION;  Surgeon: Jules Husbands, MD;  Location: [facility];  Service: General;;  . BREAST SURGERY Bilateral 1991   mastectomy  . CATARACT EXTRACTION Bilateral   . CHOLECYSTECTOMY    . COLONOSCOPY  2013   Dr Gustavo Lah  . HALLUX VALGUS AUSTIN Right 06/03/2015   Procedure: HALLUX VALGUS AUSTIN;  Surgeon: Samara Deist, DPM;  Location: [facility];  Service: Podiatry;  Laterality: Right;  WITH POPLITEAL  . KNEE ARTHROSCOPY WITH MEDIAL MENISECTOMY Right 08/30/2016   Procedure: KNEE ARTHROSCOPY WITH PARTIAL MEDIAL MENISECTOMY and Partial Lateral Menisectomy;  Surgeon: Hessie Knows, MD;  Location:  [facility];  Service: Orthopedics;  Laterality: Right;  . KNEE ARTHROSCOPY WITH SUBCHONDROPLASTY Right 08/30/2016    Procedure: KNEE ARTHROSCOPY WITH SUBCHONDROPLASTY;  Surgeon: Hessie Knows, MD;  Location: [facility];  Service: Orthopedics;  Laterality: Right;  . LAPAROTOMY N/A 01/17/2018   Procedure: EXPLORATORY LAPAROTOMY;  Surgeon: Jules Husbands, MD;  Location: [facility];  Service: General;  Laterality: N/A;  . MASTECTOMY Bilateral   . ROTATOR CUFF REPAIR Bilateral   . TONSILLECTOMY    . TOTAL KNEE ARTHROPLASTY Left   . TOTAL KNEE ARTHROPLASTY Right 05/30/2017   Procedure: TOTAL KNEE ARTHROPLASTY;  Surgeon: Hessie Knows, MD;  Location: [facility];  Service: Orthopedics;  Laterality: Right;  Marland Kitchen VAGINAL HYSTERECTOMY      Social History   Tobacco Use  . Smoking status: Former Smoker    Packs/day: 0.25    Years: 10.00    Pack years: 2.50    Types: Cigarettes    Last attempt to quit: 1991    Years since quitting: 29.1  . Smokeless tobacco: Never Used  . Tobacco comment: smoking cessation materials not required  Substance Use Topics  . Alcohol use: No    Alcohol/week: 0.0 standard drinks  . Drug use: No     Medication list has been reviewed and updated.  Current Meds  Medication Sig  . acetaminophen (TYLENOL) 325 MG tablet Take 1-2 tablets (325-650 mg total) by mouth every 6 (six) hours as needed for mild pain (pain score 1-3 or temp > 100.5).  Marland Kitchen amLODipine (NORVASC) 5 MG tablet Take 5 mg by mouth daily.  Marland Kitchen omeprazole (PRILOSEC) 20 MG capsule Take 20 mg by mouth daily.    PHQ 2/9 Scores 02/02/2018 08/15/2017 04/06/2017 06/07/2016  PHQ - 2 Score 0 0 0 0  PHQ- 9 Score 0 1 - -    Physical Exam Vitals signs and nursing note reviewed.  Constitutional:      Appearance: She is well-developed.  HENT:     Head: Normocephalic.     Right Ear: Tympanic membrane, ear canal and external ear normal.     Left Ear: Tympanic membrane, ear canal and external ear normal.  Eyes:     General: Lids are everted, no foreign bodies appreciated. No scleral icterus.       Left eye: No foreign body or hordeolum.      Conjunctiva/sclera: Conjunctivae normal.     Right eye: Right conjunctiva is not injected.     Left eye: Left conjunctiva is not injected.     Pupils: Pupils are equal, round, and reactive to light.  Neck:     Musculoskeletal: Normal range of motion and neck supple.     Thyroid: No thyromegaly.     Vascular: No JVD.     Trachea: No tracheal deviation.  Cardiovascular:     Rate and Rhythm: Normal rate and regular rhythm.     Heart sounds: Normal heart sounds. No murmur. No friction rub. No gallop.   Pulmonary:     Effort: Pulmonary effort is normal. No respiratory distress.     Breath sounds: Normal breath sounds. No wheezing or rales.  Abdominal:     General: Bowel sounds are normal.     Palpations: Abdomen is soft. There is no mass.     Tenderness: There is no abdominal tenderness. There is no guarding or rebound.  Musculoskeletal: Normal range of motion.        General: No tenderness.  Lymphadenopathy:     Cervical: No cervical  adenopathy.  Skin:    General: Skin is warm.     Findings: No rash.  Neurological:     Mental Status: She is alert and oriented to person, place, and time.     Cranial Nerves: No cranial nerve deficit.     Deep Tendon Reflexes: Reflexes normal.  Psychiatric:        Mood and Affect: Mood is not anxious or depressed.     BP (!) 148/92   Pulse 63   Ht 5\' 5"  (1.651 m)   Wt 194 lb (88 kg)   SpO2 97%   BMI 32.28 kg/m   Assessment and Plan: 1. Essential (primary) hypertension Chronic. Patient is followed by Dr Nehemiah Massed. She was last seen in November of 2019. Patient needs to get in with cardiology before May, which is her original scheduled appointment. Refer to cardiology.  - Ambulatory referral to Cardiology  2. Gastroduodenal ulcer Acute. Patient is having nausea and diarrhea since her surgery with GI. Scheduled appt with Chrstianne London today at 11:00 for further eval and treatment.  3. Postsurgical dumping syndrome Patient c/o chronic  nausea and diarrhea since surgery with GI. Scheduled appt with Jacqulyn Liner today at 11:00

## 2018-05-15 NOTE — Patient Instructions (Signed)
Eating Plan for Dumping Syndrome  Dumping syndrome is a term that describes a group of symptoms that occur when the stomach empties too quickly. This most often happens after weight loss (bariatric) or stomach (gastric) surgery. Changing how and what you eat can often help to control these symptoms. Work with your health care provider or a diet and nutrition specialist (dietitian) to make specific changes to what and how you eat to help prevent and treat symptoms of dumping syndrome. What are tips for following this plan? Reading food labels  Check food labels for added sugar. Avoid eating simple carbohydrates and foods that have added sugar, such as fruit juices, white bread, pastries, and sweets. Foods that are high in sugar are especially likely to cause symptoms.  Check food ingredients for whole grains. Try to make sure that at least half of your daily servings of grains come from whole grains. These foods are high in fiber, which helps to slow down digestion. Shopping  Avoid dairy products if they aggravate your symptoms. Try lactose-free dairy products instead.  Buy and eat foods that have complex carbohydrates, such as whole grains, vegetables, and beans.  Avoid processed foods. These may have added sugar and fat. Cooking  Your health care provider or dietitian may suggest that you increase the thickness of foods by adding food thickening products, such as pectin or corn starch. Follow instructions from your health care provider or dietitian on how to thicken foods. Meal planning  Eat 5-6 small meals each day instead of 3 large meals.  Do not drink liquids with meals. ? Avoid drinking liquids for 30 minutes before meals. ? Avoid drinking liquids for at least 30 minutes after meals.  Eat a balanced diet. A balanced diet includes: ? 6-8 oz of grains each day. ? 2-3 cups of vegetables each day. ? 1-2 cups of fruit each day. ? 2-3 cups of dairy each day. ? Your dietitian may  determine how much meat and other protein foods you should eat based on your body weight.  Eat one food that is high in protein with each meal and snack. Lifestyle   Do not drink alcohol.  Lie down for 15-30 minutes after meals. This will help to prevent light-headedness and will slow down the emptying of your stomach.  Follow your health care provider's instructions about what fluids you should avoid. Generally, you should avoid drinks that have: ? Sugar. ? Carbonation. ? Caffeine. General information  Avoid eating very hot or very cold foods. These may aggravate your symptoms.  Cut food into small pieces and chew thoroughly before you swallow.  Take a fiber supplement if recommended by your health care provider.  Take protein or other nutritional supplements as told by your health care provider or dietitian.  Take over-the-counter and prescription medicines only as told by your health care provider. What foods can I eat?  Fruits All fresh, frozen, and canned fruits that are packed in water. Unsweetened applesauce. Vegetables All fresh, frozen, and canned vegetables. Grains Whole-grain bread. Brown rice. Whole-grain pasta. Corn tortillas. Multigrain crackers. Meats and other proteins Lean meats. Chicken. Fish. Eggs. Tofu. Deli meat. Nuts and seeds. Unsweetened nut butters. Beans. Dairy Low-fat or fat-free milk. Low-fat plain yogurt. Light yogurt. Cheese. Lactose-free dairy products. Beverages Water. Sugar-free, non-carbonated soft drinks. Unsweetened decaf tea. Unsweetened decaf coffee. Sugar-free hot chocolate. Unsweetened soy, almond, or rice milk. Condiments Sugar substitutes. Sugar-free jam and sugar-free jelly. Sugar-free syrup. Sweets and desserts Sugar-free gelatin. Sugar-free pudding. Sugar-free  ice cream. Fats and oils Butter. Vegetable oil. Salad dressings. Mayonnaise. Avocado. Cream cheese. Other Broth. All spices and herbs. Over-the-counter fiber  supplements. The items listed above may not be a complete list of foods and beverages you can eat. Contact a dietitian for more options. What foods are not recommended? Fruits Fruits that are dried with added sugar. Canned fruit in syrup. Fruit juice. Vegetables Breaded or fried vegetables. Grains Cereals with added sugar. Pastries. Cakes. Sweet and white breads. Flavored instant oatmeal. Meats and other proteins Breaded or fried meats. Meats that are cooked with glazes or sweetened sauces. Dairy Flavored milk. Sugar-sweetened yogurt. Milkshakes. Beverages Hot chocolate. Regular sugar carbonated beverages. Diet carbonated beverages. Sweetened coconut milk. Sweetened coffee drinks. Caffeinated coffee or tea. Condiments Honey. Sugar. Syrup. Jam and jelly. Molasses. Agave nectar. Sweets and desserts Cakes. Candies. Ice Cream. Donuts. Puddings. Pies. Chocolate. Fats and oils Frostings. Sweetened nut butters. Other Candied nuts. Candied fruit. The items listed above may not be a complete list of foods and beverages you should avoid. Contact a dietitian for more information. Summary  Dumping syndrome is a term that is used to describe a group of symptoms that occur when the stomach empties too quickly. Changing how and what you eat can often help to control these symptoms.  Your health care provider or dietitian may recommend specific changes to your diet to help prevent and treat symptoms of dumping syndrome. General recommendations include eating small meals, not drinking liquids 30 minutes before or after a meal, and avoiding sugar. This information is not intended to replace advice given to you by your health care provider. Make sure you discuss any questions you have with your health care provider. Document Released: 02/17/2011 Document Revised: 03/01/2017 Document Reviewed: 03/01/2017 Elsevier Interactive Patient Education  2019 [place] Syndrome Dumping syndrome is a  combination of symptoms that occur when food passes through the stomach too quickly. This is most often caused by weight loss (bariatric) or stomach (gastric) surgery. There are two types of dumping syndrome, and some people have both types:  Early dumping syndrome causes symptoms that occur soon after eating (10-30 minutes).  Late dumping syndrome causes symptoms that occur a few hours after eating (2-3 hours). When you eat and drink, your stomach needs time to digest everything. With dumping syndrome, undigested food and stomach fluids move too quickly into the small intestine, causing symptoms of early dumping syndrome. In other cases, large amounts of sugar move into the small intestine, which causes the pancreas to release too much of the hormone insulin. As a result, your blood sugar can get too low (hypoglycemia), and you may get symptoms of late dumping syndrome. What are the causes?  Early dumping syndrome is caused by large amounts of undigested food and liquids passing into the upper part of your small intestine.  Late dumping syndrome is caused by large amounts of sugar from your diet moving into your small intestine. What increases the risk? Certain types of stomach surgery can lead to dumping syndrome. You may be at risk for dumping syndrome if you have had:  Surgery for ulcers or stomach cancer.  Weight loss surgery (bariatric surgery).  Surgery to treat severe heartburn (gastroesophageal reflux). What are the signs or symptoms? Early dumping syndrome Symptoms of early dumping syndrome may include:  Nausea.  Fullness or bloating.  Cramps.  Vomiting.  Feeling flushed.  Sweating.  Feeling dizzy.  A fast or irregular heartbeat (palpitations).  Diarrhea.  Headache. Late dumping  syndrome Symptoms of late dumping syndrome may include:  Dizziness.  Weakness.  Sweating.  Palpitations.  Hunger.  Confusion. How is this diagnosed? This condition may be  diagnosed based on:  Your symptoms and medical history.  A physical exam.  Tests, such as: ? A modified glucose tolerance test. This blood test measures insulin secretion after you have a certain type of sugary (glucose) drink. ? Gastric emptying test. This test involves eating a bland meal that includes a material that shows up easily on an X-ray. After eating the meal, you have X-rays to find out how long it takes the stomach to empty. ? Upper endoscopy. In this exam, a health care provider puts a flexible telescope (endoscope) through your mouth and throat to check the inside of your stomach. How is this treated? For most people, this condition can be managed with diet changes. You may need to change your diet, nutrition, and eating habits. Working with a dietitian can be helpful. Other treatments may include:  Medicine that slows down stomach emptying and reduces insulin secretion. This medicine may be given by injection.  Medicine that slows down the digestion of sugar. This medicine is usually used to treat type 2 diabetes. Sometimes it can also relieve symptoms of late dumping syndrome.  Surgery. For severe cases of dumping syndrome, reconstructive stomach surgery may be needed if other treatments are not working. Follow these instructions at home: Eating strategies  Avoid drinking liquids for 30 minutes before meals.  Avoid drinking liquids for at least 30 minutes after meals.  Lie down for 15-30 minutes after meals. This will help to prevent light-headedness and will slow down the emptying of your stomach.  Eat smaller portions of food. Cut food into small pieces and chew thoroughly before you swallow.  Work with a Microbiologist to change your diet and maintain good nutrition. What to eat and drink   Try to make sure that at least half of your daily servings of grains come from whole grains. These foods are high in fiber, which helps to slow down digestion.  Eat more complex  carbohydrates, such as whole grains, vegetables, and beans.  Avoid eating simple carbohydrates and foods that have added sugar, such as fruit juices, white bread, pastries, and sweets. Foods that are high in sugar are especially likely to bring about symptoms.  With each meal and snack, eat one food that is high in protein.  Avoid dairy products if they aggravate your symptoms. Try lactose-free dairy products instead.  Avoid drinking alcohol.  Take a fiber supplement if recommended by your health care provider.  Increase the thickness of foods by adding plant-based thickening agents, such as pectin.  Avoid eating very hot or very cold foods. They may aggravate your symptoms. General instructions  Take over-the-counter and prescription medicines only as told by your health care provider.  Keep all follow-up visits as told by your health care provider. This is important. Contact a health care provider if you:  Notice that your symptoms change or get worse.  Find that diet changes do not help your symptoms.  Have trouble maintaining a healthy weight. Get help right away if you:  Become dehydrated.  Have severe dizziness.  Notice that your heartbeat becomes very irregular. Summary  Dumping syndrome is a combination of symptoms that occur when food passes through the stomach too quickly.  Symptoms may include nausea, vomiting, diarrhea, dizziness, weakness, or hunger.  This condition is often treated with dietary changes and medicines.  Work with a Microbiologist to change your diet and maintain good nutrition. This information is not intended to replace advice given to you by your health care provider. Make sure you discuss any questions you have with your health care provider. Document Released: 01/22/2004 Document Revised: 01/03/2017 Document Reviewed: 01/03/2017 Elsevier Interactive Patient Education  2019 [employer].

## 2018-05-16 ENCOUNTER — Other Ambulatory Visit
Admission: RE | Admit: 2018-05-16 | Discharge: 2018-05-16 | Disposition: A | Discharge status: Home / Self Care | Payer: Medicare HMO | Source: Ambulatory Visit | Attending: Gastroenterology | Admitting: Gastroenterology

## 2018-05-16 DIAGNOSIS — R197 Diarrhea, unspecified: Secondary | ICD-10-CM | POA: Diagnosis not present

## 2018-05-16 LAB — C DIFFICILE QUICK SCREEN W PCR REFLEX
C Diff antigen: NEGATIVE
C Diff interpretation: NOT DETECTED
C Diff toxin: NEGATIVE

## 2018-05-16 LAB — GASTROINTESTINAL PANEL BY PCR, STOOL (REPLACES STOOL CULTURE)
Adenovirus F40/41: NOT DETECTED
Astrovirus: NOT DETECTED
CRYPTOSPORIDIUM: NOT DETECTED
Campylobacter species: NOT DETECTED
Cyclospora cayetanensis: NOT DETECTED
ENTAMOEBA HISTOLYTICA: NOT DETECTED
ENTEROAGGREGATIVE E COLI (EAEC): NOT DETECTED
Enteropathogenic E coli (EPEC): NOT DETECTED
Enterotoxigenic E coli (ETEC): NOT DETECTED
Giardia lamblia: NOT DETECTED
Norovirus GI/GII: NOT DETECTED
Plesimonas shigelloides: NOT DETECTED
Rotavirus A: NOT DETECTED
SALMONELLA SPECIES: NOT DETECTED
SHIGA LIKE TOXIN PRODUCING E COLI (STEC): NOT DETECTED
SHIGELLA/ENTEROINVASIVE E COLI (EIEC): NOT DETECTED
Sapovirus (I, II, IV, and V): NOT DETECTED
Vibrio cholerae: NOT DETECTED
Vibrio species: NOT DETECTED
YERSINIA ENTEROCOLITICA: NOT DETECTED

## 2018-05-17 DIAGNOSIS — I10 Essential (primary) hypertension: Secondary | ICD-10-CM | POA: Diagnosis not present

## 2018-05-19 LAB — PANCREATIC ELASTASE, FECAL: Pancreatic Elastase-1, Stool: 129 ug Elast./g — ABNORMAL LOW (ref >200)

## 2018-06-01 SURGERY — Surgical Case
Anesthesia: *Unknown

## 2018-06-27 ENCOUNTER — Ambulatory Visit: Payer: Self-pay | Admitting: Family Medicine

## 2018-07-27 DIAGNOSIS — Z853 Personal history of malignant neoplasm of breast: Secondary | ICD-10-CM | POA: Diagnosis not present

## 2018-07-27 DIAGNOSIS — E669 Obesity, unspecified: Secondary | ICD-10-CM | POA: Diagnosis not present

## 2018-07-27 DIAGNOSIS — K219 Gastro-esophageal reflux disease without esophagitis: Secondary | ICD-10-CM | POA: Diagnosis not present

## 2018-07-27 DIAGNOSIS — Z87891 Personal history of nicotine dependence: Secondary | ICD-10-CM | POA: Diagnosis not present

## 2018-07-27 DIAGNOSIS — I10 Essential (primary) hypertension: Secondary | ICD-10-CM | POA: Diagnosis not present

## 2018-07-27 DIAGNOSIS — Z880 Allergy status to penicillin: Secondary | ICD-10-CM | POA: Diagnosis not present

## 2018-07-27 DIAGNOSIS — Z7982 Long term (current) use of aspirin: Secondary | ICD-10-CM | POA: Diagnosis not present

## 2018-08-07 DIAGNOSIS — H353131 Nonexudative age-related macular degeneration, bilateral, early dry stage: Secondary | ICD-10-CM | POA: Diagnosis not present

## 2018-08-07 DIAGNOSIS — Z961 Presence of intraocular lens: Secondary | ICD-10-CM | POA: Diagnosis not present

## 2018-08-14 ENCOUNTER — Other Ambulatory Visit: Payer: Self-pay

## 2018-08-14 DIAGNOSIS — I2510 Atherosclerotic heart disease of native coronary artery without angina pectoris: Secondary | ICD-10-CM | POA: Diagnosis not present

## 2018-08-14 DIAGNOSIS — R011 Cardiac murmur, unspecified: Secondary | ICD-10-CM | POA: Diagnosis not present

## 2018-08-14 DIAGNOSIS — I712 Thoracic aortic aneurysm, without rupture: Secondary | ICD-10-CM | POA: Diagnosis not present

## 2018-08-14 DIAGNOSIS — I10 Essential (primary) hypertension: Secondary | ICD-10-CM | POA: Diagnosis not present

## 2018-08-14 DIAGNOSIS — E782 Mixed hyperlipidemia: Secondary | ICD-10-CM | POA: Diagnosis not present

## 2018-08-15 ENCOUNTER — Inpatient Hospital Stay: Payer: Medicare HMO | Attending: Oncology

## 2018-08-15 ENCOUNTER — Telehealth: Payer: Self-pay | Admitting: *Deleted

## 2018-08-15 ENCOUNTER — Other Ambulatory Visit: Payer: Self-pay | Admitting: Oncology

## 2018-08-15 ENCOUNTER — Ambulatory Visit
Admission: RE | Admit: 2018-08-15 | Discharge: 2018-08-15 | Disposition: A | Discharge status: Home / Self Care | Payer: Medicare HMO | Source: Ambulatory Visit | Attending: Oncology | Admitting: Oncology

## 2018-08-15 ENCOUNTER — Other Ambulatory Visit: Payer: Self-pay

## 2018-08-15 DIAGNOSIS — Z9013 Acquired absence of bilateral breasts and nipples: Secondary | ICD-10-CM | POA: Diagnosis not present

## 2018-08-15 DIAGNOSIS — Z885 Allergy status to narcotic agent status: Secondary | ICD-10-CM | POA: Diagnosis not present

## 2018-08-15 DIAGNOSIS — E039 Hypothyroidism, unspecified: Secondary | ICD-10-CM | POA: Diagnosis not present

## 2018-08-15 DIAGNOSIS — M1990 Unspecified osteoarthritis, unspecified site: Secondary | ICD-10-CM | POA: Insufficient documentation

## 2018-08-15 DIAGNOSIS — Z79899 Other long term (current) drug therapy: Secondary | ICD-10-CM | POA: Insufficient documentation

## 2018-08-15 DIAGNOSIS — Z888 Allergy status to other drugs, medicaments and biological substances status: Secondary | ICD-10-CM | POA: Diagnosis not present

## 2018-08-15 DIAGNOSIS — C7A8 Other malignant neuroendocrine tumors: Secondary | ICD-10-CM | POA: Insufficient documentation

## 2018-08-15 DIAGNOSIS — Z880 Allergy status to penicillin: Secondary | ICD-10-CM | POA: Diagnosis not present

## 2018-08-15 DIAGNOSIS — F329 Major depressive disorder, single episode, unspecified: Secondary | ICD-10-CM | POA: Diagnosis not present

## 2018-08-15 DIAGNOSIS — J439 Emphysema, unspecified: Secondary | ICD-10-CM | POA: Diagnosis not present

## 2018-08-15 DIAGNOSIS — Z9049 Acquired absence of other specified parts of digestive tract: Secondary | ICD-10-CM | POA: Diagnosis not present

## 2018-08-15 DIAGNOSIS — Z8249 Family history of ischemic heart disease and other diseases of the circulatory system: Secondary | ICD-10-CM | POA: Diagnosis not present

## 2018-08-15 DIAGNOSIS — F419 Anxiety disorder, unspecified: Secondary | ICD-10-CM | POA: Insufficient documentation

## 2018-08-15 DIAGNOSIS — N83202 Unspecified ovarian cyst, left side: Secondary | ICD-10-CM | POA: Insufficient documentation

## 2018-08-15 DIAGNOSIS — R69 Illness, unspecified: Secondary | ICD-10-CM | POA: Diagnosis not present

## 2018-08-15 DIAGNOSIS — K7460 Unspecified cirrhosis of liver: Secondary | ICD-10-CM | POA: Diagnosis not present

## 2018-08-15 DIAGNOSIS — K5730 Diverticulosis of large intestine without perforation or abscess without bleeding: Secondary | ICD-10-CM | POA: Diagnosis not present

## 2018-08-15 DIAGNOSIS — R918 Other nonspecific abnormal finding of lung field: Secondary | ICD-10-CM | POA: Diagnosis not present

## 2018-08-15 DIAGNOSIS — Z87891 Personal history of nicotine dependence: Secondary | ICD-10-CM | POA: Diagnosis not present

## 2018-08-15 DIAGNOSIS — I10 Essential (primary) hypertension: Secondary | ICD-10-CM | POA: Diagnosis not present

## 2018-08-15 DIAGNOSIS — K862 Cyst of pancreas: Secondary | ICD-10-CM | POA: Diagnosis not present

## 2018-08-15 LAB — COMPREHENSIVE METABOLIC PANEL
ALT: 43 U/L (ref 0–44)
AST: 60 U/L — ABNORMAL HIGH (ref 15–41)
Albumin: 4.2 g/dL (ref 3.5–5.0)
Alkaline Phosphatase: 77 U/L (ref 38–126)
Anion gap: 11 (ref 5–15)
BUN: 15 mg/dL (ref 8–23)
CO2: 24 mmol/L (ref 22–32)
Calcium: 9.6 mg/dL (ref 8.9–10.3)
Chloride: 103 mmol/L (ref 98–111)
Creatinine, Ser: 0.96 mg/dL (ref 0.44–1.00)
GFR calc Af Amer: >60 mL/min (ref >60)
GFR calc non Af Amer: 55 mL/min — ABNORMAL LOW (ref >60)
Glucose, Bld: 114 mg/dL — ABNORMAL HIGH (ref 70–99)
Potassium: 3.1 mmol/L — ABNORMAL LOW (ref 3.5–5.1)
Sodium: 138 mmol/L (ref 135–145)
Total Bilirubin: 0.7 mg/dL (ref 0.3–1.2)
Total Protein: 7.6 g/dL (ref 6.5–8.1)

## 2018-08-15 LAB — CBC WITH DIFFERENTIAL/PLATELET
Abs Immature Granulocytes: 0.02 10*3/uL (ref 0.00–0.07)
Basophils Absolute: 0.1 10*3/uL (ref 0.0–0.1)
Basophils Relative: 1 %
Eosinophils Absolute: 0.1 10*3/uL (ref 0.0–0.5)
Eosinophils Relative: 2 %
HCT: 36.6 % (ref 36.0–46.0)
Hemoglobin: 12 g/dL (ref 12.0–15.0)
Immature Granulocytes: 0 %
Lymphocytes Relative: 29 %
Lymphs Abs: 1.7 10*3/uL (ref 0.7–4.0)
MCH: 29.6 pg (ref 26.0–34.0)
MCHC: 32.8 g/dL (ref 30.0–36.0)
MCV: 90.4 fL (ref 80.0–100.0)
Monocytes Absolute: 0.5 10*3/uL (ref 0.1–1.0)
Monocytes Relative: 8 %
Neutro Abs: 3.5 10*3/uL (ref 1.7–7.7)
Neutrophils Relative %: 60 %
Platelets: 181 10*3/uL (ref 150–400)
RBC: 4.05 MIL/uL (ref 3.87–5.11)
RDW: 14.6 % (ref 11.5–15.5)
WBC: 5.9 10*3/uL (ref 4.0–10.5)
nRBC: 0 % (ref 0.0–0.2)

## 2018-08-15 MED ORDER — IOHEXOL 300 MG/ML  SOLN
100.0000 mL | Freq: Once | INTRAMUSCULAR | Status: AC | PRN
  Administered 2018-08-15: 100 mL via INTRAVENOUS

## 2018-08-15 MED ORDER — POTASSIUM CHLORIDE CRYS ER 20 MEQ PO TBCR: 20.0000 meq | EXTENDED_RELEASE_TABLET | Freq: Every day | ORAL | 0 refills | Status: DC

## 2018-08-15 NOTE — Telephone Encounter (Signed)
-----   Message from Sindy Guadeloupe, MD sent at 08/15/2018 10:04 AM EDT ----- Judeen Hammans- Potassium low. I will send in 20 meq for 1 week. Thanks, Archana  Dr. Ronnald Ramp- she has had chronic hypokalemia. Can you please follow up? Thank you, Astrid Divine

## 2018-08-15 NOTE — Telephone Encounter (Signed)
Called patient and let her know that her potassium was low on her lab work and dr Janese Banks said that she sent rx for potassium to her drug store.  The patient states she will go to pharmacy tom.  She states that everytime anyone  Checks it is is low. I told her that a message was sent to her PCP to check up on it for the future. She is good with the plan

## 2018-08-17 ENCOUNTER — Ambulatory Visit: Payer: Medicare HMO | Admitting: Oncology

## 2018-08-20 ENCOUNTER — Other Ambulatory Visit: Payer: Self-pay

## 2018-08-20 ENCOUNTER — Encounter: Payer: Self-pay | Admitting: Family Medicine

## 2018-08-20 ENCOUNTER — Ambulatory Visit (INDEPENDENT_AMBULATORY_CARE_PROVIDER_SITE_OTHER): Payer: Medicare HMO | Admitting: Family Medicine

## 2018-08-20 VITALS — BP 130/80 | HR 64 | Ht 65.0 in | Wt 190.0 lb

## 2018-08-20 DIAGNOSIS — M7702 Medial epicondylitis, left elbow: Secondary | ICD-10-CM | POA: Diagnosis not present

## 2018-08-20 DIAGNOSIS — E876 Hypokalemia: Secondary | ICD-10-CM

## 2018-08-20 DIAGNOSIS — E034 Atrophy of thyroid (acquired): Secondary | ICD-10-CM | POA: Diagnosis not present

## 2018-08-20 MED ORDER — MELOXICAM 7.5 MG PO TABS: 7.5000 mg | ORAL_TABLET | Freq: Every day | ORAL | 0 refills | Status: DC

## 2018-08-20 NOTE — Patient Instructions (Signed)
Golfer's Elbow    Golfer's elbow, also called medial epicondylitis, is a condition that results from inflammation of the strong bands of tissue (tendons) that attach your forearm muscles to the inside of your bone at the elbow. These tendons affect the muscles that bend the palm toward the wrist (flexion).  This condition is called golfer's elbow because it is more common among people who constantly bend and twist their wrists, such as golfers. This injury usually results from overuse. Tendons also become less flexible with age.  This condition causes elbow pain that may spread to your forearm and upper arm. The pain may get worse when you bend your wrist downward.  What are the causes?  This condition is an overuse injury that is caused by:  Repeatedly flexing, turning, or twisting your wrist.  Constantly gripping objects with your hands.  What increases the risk?  This condition is more likely to develop in people who play golf or tennis or have jobs that require the constant use of their hands. This injury is more common among:  Carpenters.  Gardeners.  Musicians.  Bricklayers.  Typists.  What are the signs or symptoms?  Symptoms of this condition include:  Pain near the inner elbow or forearm.  Reduced grip strength.  How is this diagnosed?  This condition is diagnosed based on your symptoms, medical history, and physical exam. During the exam, your health care provider may test your grip strength and move your wrist to check for pain. You may also have an MRI to confirm the diagnosis, look for other issues, and check for tears in the ligaments, muscles, or tendons.  How is this treated?  Treatment for this condition includes:  Stopping all activities that make you bend or twist your wrist until your pain and other symptoms go away.  Icing your wrist to relieve pain.  Taking NSAIDs or getting corticosteroid injections to reduce pain and swelling.  Doing stretches, range-of-motion, and strengthening exercises  (physical therapy) as told by your health care provider.  In rare cases, surgery may be needed if your condition does not improve.  Follow these instructions at home:  If directed, apply ice to the injured area.  Put ice in a plastic bag.  Place a towel between your skin and the bag.  Leave the ice on for 20 minutes, 2-3 times a day.  Move your fingers often to avoid stiffness.  Raise (elevate) the injured area above the level of your heart while you are sitting or lying down.  Return to your normal activities as told by your health care provider. Ask your health care provider what activities are safe for you.  Do exercises as told by your health care provider.  Do not use tobacco products, including cigarettes, chewing tobacco, or e-cigarettes. If you need help quitting, ask your health care provider.  Take over-the-counter and prescription medicines only as told by your health care provider.  Keep all follow-up visits as told by your health care provider. This is important.  How is this prevented?  Warm up and stretch before being active.  Cool down and stretch after being active.  Give your body time to rest between periods of activity.  Make sure to use equipment that fits you.  Be safe and responsible while being active to avoid falls.  Do at least 150 minutes of moderate-intensity exercise each week, such as brisk walking or water aerobics.  Maintain physical fitness, including:  Strength.  Flexibility.  Cardiovascular fitness.    Endurance.  Perform exercises to strengthen the forearm muscles.  Slow your golf swing to reduce shock in the arm when making contact with the ball, if you play golf.  Contact a health care provider if:  Your pain does not improve or it gets worse.  You notice numbness in your hand.  Get help right away if:  Your pain is severe.  You cannot move your wrist.  This information is not intended to replace advice given to you by your health care provider. Make sure you discuss any questions  you have with your health care provider.  Document Released: 02/28/2005 Document Revised: 11/17/2017 Document Reviewed: 11/10/2014  Elsevier Interactive Patient Education  2019 [employer]

## 2018-08-20 NOTE — Progress Notes (Signed)
Date:  08/20/2018   Name:  Kaitlyn Oliver   DOB:  [birth year]   MRN:  [id number]   Chief Complaint: Fatigue (needs thyroid recheked- been off med for 10 months) and potassium (needs  recheck)  Thyroid Problem  Presents for initial visit. Symptoms include cold intolerance, diarrhea, dry skin, fatigue, hair loss, heat intolerance and weight loss. Patient reports no anxiety, constipation, depressed mood, diaphoresis, hoarse voice, leg swelling, nail problem, palpitations, visual change or weight gain. The symptoms have been worsening. Improvement on treatment: worse / not taking levothyroxine.  Arm Pain   The incident occurred more than 1 week ago (2 weeks). There was no injury mechanism (planted garden). The pain is present in the left elbow (medial aspect). The quality of the pain is described as aching. The pain is mild. The pain has been fluctuating since the incident. Associated symptoms include numbness. Pertinent negatives include no chest pain, muscle weakness or tingling. She has tried NSAIDs for the symptoms.    Review of Systems  Constitutional: Positive for fatigue and weight loss. Negative for chills, diaphoresis, fever and weight gain.  HENT: Negative for drooling, ear discharge, ear pain, hoarse voice and sore throat.   Respiratory: Negative for cough, shortness of breath and wheezing.   Cardiovascular: Negative for chest pain, palpitations and leg swelling.  Gastrointestinal: Positive for diarrhea. Negative for abdominal pain, blood in stool, constipation and nausea.  Endocrine: Positive for cold intolerance and heat intolerance. Negative for polydipsia.  Genitourinary: Negative for dysuria, frequency, hematuria and urgency.  Musculoskeletal: Negative for back pain, myalgias and neck pain.  Skin: Negative for rash.  Allergic/Immunologic: Negative for environmental allergies.  Neurological: Positive for numbness. Negative for dizziness, tingling and headaches.   Hematological: Does not bruise/bleed easily.  Psychiatric/Behavioral: Negative for suicidal ideas. The patient is not nervous/anxious.     Patient Active Problem List   Diagnosis Date Noted  . 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 Goals of care, counseling/discussion 01/26/2018  . Small bowel tumor   . Pancreatic cyst 01/15/2018  . Anxiety and depression 01/15/2018  . Pancreas cyst 01/15/2018  . Bowel obstruction ([place]) 11/29/2017  . Small bowel obstruction ([place])   . 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 Status post total knee replacement using cement, right 05/30/2017  . GERD (gastroesophageal reflux disease) 12/22/2015  . Adenomatous polyp 09/11/2014  . Bilateral cataracts 09/11/2014  . Gastric catarrh 09/11/2014  . Gastroduodenal ulcer 09/11/2014  . Hypothyroidism 09/11/2014  . Cardiac murmur 10/08/2013  . Essential (primary) hypertension 10/08/2013  . Combined fat and carbohydrate induced hyperlipemia 10/08/2013    Allergies  Allergen Reactions  . Adhesive [Tape] Dermatitis    Can not use plastic tape. Paper tape ok   . Penicillin G Itching    Has patient had a PCN reaction causing immediate rash, facial/tongue/throat swelling, SOB or lightheadedness with hypotension: No Has patient had a PCN reaction causing severe rash involving mucus membranes or skin necrosis: No Has patient had a PCN reaction that required hospitalization: No Has patient had a PCN reaction occurring within the last 10 years: No If all of the above answers are "NO", then may proceed with Cephalosporin use.   Marland Kitchen Hydrocodone-Acetaminophen Itching and Rash    Past Surgical History:  Procedure Laterality Date  . BOWEL RESECTION  01/17/2018   Procedure: SMALL BOWEL RESECTION;  Surgeon: Jules Husbands, MD;  Location: [facility];  Service: General;;  . BREAST SURGERY Bilateral 1991  mastectomy  . CATARACT EXTRACTION Bilateral   . CHOLECYSTECTOMY    .  COLONOSCOPY  2013   Dr Gustavo Lah  . HALLUX VALGUS AUSTIN Right 06/03/2015   Procedure: HALLUX VALGUS AUSTIN;  Surgeon: Samara Deist, DPM;  Location: [facility];  Service: Podiatry;  Laterality: Right;  WITH POPLITEAL  . KNEE ARTHROSCOPY WITH MEDIAL MENISECTOMY Right 08/30/2016   Procedure: KNEE ARTHROSCOPY WITH PARTIAL MEDIAL MENISECTOMY and Partial Lateral Menisectomy;  Surgeon: Hessie Knows, MD;  Location: [facility];  Service: Orthopedics;  Laterality: Right;  . KNEE ARTHROSCOPY WITH SUBCHONDROPLASTY Right 08/30/2016   Procedure: KNEE ARTHROSCOPY WITH SUBCHONDROPLASTY;  Surgeon: Hessie Knows, MD;  Location: [facility];  Service: Orthopedics;  Laterality: Right;  . LAPAROTOMY N/A 01/17/2018   Procedure: EXPLORATORY LAPAROTOMY;  Surgeon: Jules Husbands, MD;  Location: [facility];  Service: General;  Laterality: N/A;  . MASTECTOMY Bilateral   . ROTATOR CUFF REPAIR Bilateral   . TONSILLECTOMY    . TOTAL KNEE ARTHROPLASTY Left   . TOTAL KNEE ARTHROPLASTY Right 05/30/2017   Procedure: TOTAL KNEE ARTHROPLASTY;  Surgeon: Hessie Knows, MD;  Location: [facility];  Service: Orthopedics;  Laterality: Right;  Marland Kitchen VAGINAL HYSTERECTOMY      Social History   Tobacco Use  . Smoking status: Former Smoker    Packs/day: 0.25    Years: 10.00    Pack years: 2.50    Types: Cigarettes    Last attempt to quit: 1991    Years since quitting: 29.4  . Smokeless tobacco: Never Used  . Tobacco comment: smoking cessation materials not required  Substance Use Topics  . Alcohol use: No    Alcohol/week: 0.0 standard drinks  . Drug use: No     Medication list has been reviewed and updated.  Current Meds  Medication Sig  . acetaminophen (TYLENOL) 325 MG tablet Take 1-2 tablets (325-650 mg total) by mouth every 6 (six) hours as needed for mild pain (pain score 1-3 or temp > 100.5).  Marland Kitchen amLODipine (NORVASC) 5 MG tablet Take 5 mg by mouth daily.  . hydrochlorothiazide (HYDRODIURIL) 12.5 MG tablet Take 1 tablet  (12.5 mg total) by mouth daily.  Marland Kitchen omeprazole (PRILOSEC) 20 MG capsule Take 20 mg by mouth daily.  . potassium chloride SA (K-DUR) 20 MEQ tablet Take 1 tablet (20 mEq total) by mouth daily for 10 days.    PHQ 2/9 Scores 02/02/2018 08/15/2017 04/06/2017 06/07/2016  PHQ - 2 Score 0 0 0 0  PHQ- 9 Score 0 1 - -    BP Readings from Last 3 Encounters:  08/20/18 130/80  05/15/18 (!) 148/92  02/16/18 (!) 157/81    Physical Exam Vitals signs and nursing note reviewed.  Constitutional:      General: She is not in acute distress.    Appearance: She is not diaphoretic.  HENT:     Head: Normocephalic and atraumatic.     Right Ear: Tympanic membrane, ear canal and external ear normal.     Left Ear: Tympanic membrane, ear canal and external ear normal.     Nose: Nose normal.  Eyes:     General:        Right eye: No discharge.        Left eye: No discharge.     Conjunctiva/sclera: Conjunctivae normal.     Pupils: Pupils are equal, round, and reactive to light.  Neck:     Musculoskeletal: Normal range of motion and neck supple.     Thyroid: No thyromegaly.  Vascular: No JVD.  Cardiovascular:     Rate and Rhythm: Normal rate and regular rhythm.     Heart sounds: Normal heart sounds. No murmur. No friction rub. No gallop.   Pulmonary:     Effort: Pulmonary effort is normal.     Breath sounds: Normal breath sounds.  Abdominal:     General: Bowel sounds are normal.     Palpations: Abdomen is soft. There is no mass.     Tenderness: There is no abdominal tenderness. There is no guarding.  Musculoskeletal: Normal range of motion.     Left elbow: She exhibits normal range of motion. Tenderness found. Medial epicondyle tenderness noted.  Lymphadenopathy:     Cervical: No cervical adenopathy.  Skin:    General: Skin is warm and dry.  Neurological:     Mental Status: She is alert.     Deep Tendon Reflexes: Reflexes are normal and symmetric.     Wt Readings from Last 3 Encounters:   08/20/18 190 lb (86.2 kg)  05/15/18 194 lb (88 kg)  02/16/18 196 lb 3.2 oz (89 kg)    BP 130/80   Pulse 64   Ht 5\' 5"  (1.651 m)   Wt 190 lb (86.2 kg)   BMI 31.62 kg/m  Assessment and Plan: 1. Medial epicondylitis of left elbow Acute.  Patient has been doing some significant gardening over the past several weeks.  Patient has developed a pain on the inner aspect of her left elbow which is exacerbated by flexion of the wrist.  She has tenderness over the medial epicondyle consistent with for the elbow.  Patient was started on meloxicam 7.5 mg daily patient was also encouraged to take Tylenol during the day for breakthrough pain. - meloxicam (MOBIC) 7.5 MG tablet; Take 1 tablet (7.5 mg total) by mouth daily.  Dispense: 30 tablet; Refill: 0  2. Hypothyroidism due to acquired atrophy of thyroid Patient has a history of hypothyroidism that she was initially put on levothyroxine this was thought to be continued however patient actually had discontinued this thinking that it had cured the problem.  Patient has been having significant fatigue with weight gain.  We will check a TSH and acknowledge if she has persistence of her hypothyroid and will address accordingly. - TSH  3. Hypokalemia Recently started on 20 mEq KCl for hypokalemia likely secondary to diuresis.  We will check a serum potassium determine if she is sufficiently supplemented with her hypokalemia. - Potassium

## 2018-08-21 ENCOUNTER — Other Ambulatory Visit: Payer: Self-pay

## 2018-08-21 DIAGNOSIS — E034 Atrophy of thyroid (acquired): Secondary | ICD-10-CM

## 2018-08-21 DIAGNOSIS — E876 Hypokalemia: Secondary | ICD-10-CM

## 2018-08-21 LAB — POTASSIUM: Potassium: 4.1 mmol/L (ref 3.5–5.2)

## 2018-08-21 LAB — CHROMOGRANIN A REBASELINE
Chromogranin A (ng/mL): 596.6 ng/mL — ABNORMAL HIGH (ref 0.0–101.8)
Chromogranin A: 14 nmol/L — ABNORMAL HIGH (ref 0–5)

## 2018-08-21 LAB — TSH: TSH: 7.08 u[IU]/mL — ABNORMAL HIGH (ref 0.450–4.500)

## 2018-08-21 MED ORDER — LEVOTHYROXINE SODIUM 50 MCG PO TABS: 50.0000 ug | ORAL_TABLET | Freq: Every day | ORAL | 1 refills | Status: DC

## 2018-08-21 MED ORDER — POTASSIUM CHLORIDE CRYS ER 20 MEQ PO TBCR: 20.0000 meq | EXTENDED_RELEASE_TABLET | Freq: Every day | ORAL | 1 refills | Status: DC

## 2018-08-23 ENCOUNTER — Other Ambulatory Visit: Payer: Self-pay

## 2018-08-24 ENCOUNTER — Other Ambulatory Visit: Payer: Self-pay

## 2018-08-24 ENCOUNTER — Inpatient Hospital Stay (HOSPITAL_BASED_OUTPATIENT_CLINIC_OR_DEPARTMENT_OTHER): Payer: Medicare HMO | Admitting: Oncology

## 2018-08-24 VITALS — BP 132/83 | HR 64 | Temp 97.7°F | Resp 18 | Wt 198.3 lb

## 2018-08-24 DIAGNOSIS — K7460 Unspecified cirrhosis of liver: Secondary | ICD-10-CM | POA: Diagnosis not present

## 2018-08-24 DIAGNOSIS — F419 Anxiety disorder, unspecified: Secondary | ICD-10-CM

## 2018-08-24 DIAGNOSIS — I10 Essential (primary) hypertension: Secondary | ICD-10-CM | POA: Diagnosis not present

## 2018-08-24 DIAGNOSIS — Z885 Allergy status to narcotic agent status: Secondary | ICD-10-CM

## 2018-08-24 DIAGNOSIS — E039 Hypothyroidism, unspecified: Secondary | ICD-10-CM | POA: Diagnosis not present

## 2018-08-24 DIAGNOSIS — Z9049 Acquired absence of other specified parts of digestive tract: Secondary | ICD-10-CM

## 2018-08-24 DIAGNOSIS — K862 Cyst of pancreas: Secondary | ICD-10-CM | POA: Diagnosis not present

## 2018-08-24 DIAGNOSIS — Z880 Allergy status to penicillin: Secondary | ICD-10-CM

## 2018-08-24 DIAGNOSIS — R918 Other nonspecific abnormal finding of lung field: Secondary | ICD-10-CM

## 2018-08-24 DIAGNOSIS — M1990 Unspecified osteoarthritis, unspecified site: Secondary | ICD-10-CM | POA: Diagnosis not present

## 2018-08-24 DIAGNOSIS — Z87891 Personal history of nicotine dependence: Secondary | ICD-10-CM

## 2018-08-24 DIAGNOSIS — Z888 Allergy status to other drugs, medicaments and biological substances status: Secondary | ICD-10-CM

## 2018-08-24 DIAGNOSIS — N83202 Unspecified ovarian cyst, left side: Secondary | ICD-10-CM | POA: Diagnosis not present

## 2018-08-24 DIAGNOSIS — Z79899 Other long term (current) drug therapy: Secondary | ICD-10-CM

## 2018-08-24 DIAGNOSIS — K5730 Diverticulosis of large intestine without perforation or abscess without bleeding: Secondary | ICD-10-CM

## 2018-08-24 DIAGNOSIS — F329 Major depressive disorder, single episode, unspecified: Secondary | ICD-10-CM

## 2018-08-24 DIAGNOSIS — Z9013 Acquired absence of bilateral breasts and nipples: Secondary | ICD-10-CM

## 2018-08-24 DIAGNOSIS — C7A8 Other malignant neuroendocrine tumors: Secondary | ICD-10-CM | POA: Diagnosis not present

## 2018-08-24 DIAGNOSIS — R69 Illness, unspecified: Secondary | ICD-10-CM | POA: Diagnosis not present

## 2018-08-24 DIAGNOSIS — Z8249 Family history of ischemic heart disease and other diseases of the circulatory system: Secondary | ICD-10-CM

## 2018-08-24 NOTE — Progress Notes (Signed)
Patient does not offer any problems today. She started levothyroxine by PCP

## 2018-08-25 ENCOUNTER — Other Ambulatory Visit: Payer: Self-pay | Admitting: *Deleted

## 2018-08-25 DIAGNOSIS — C7A8 Other malignant neuroendocrine tumors: Secondary | ICD-10-CM

## 2018-08-27 ENCOUNTER — Encounter: Payer: Self-pay | Admitting: Oncology

## 2018-08-27 NOTE — Progress Notes (Signed)
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 Patient also was admitted for an episode of bowel obstruction about 2 months ago which resolved conservatively. CT abdomen done during this admission showed an abnormal small bowel loop in the right lower quadrant and adjacent 4 cm necrotic mesenteric lesion which could be a potential lymph node. Patient also noted to have hypoattenuating cystic lesions in the head of the pancreas. Hepatic cirrhosis. This was followed by MRI of the abdomen with and without contrast which again showed multiple cystic lesions in the pancreas either pancreatic pseudocyst or cystic neoplasm of the pancreas.  Patient had exploratory laparotomy and resection of the small bowel tumor. Pathology showed well-differentiated neuroendocrine tumor grade 1, 3.7 cm mitotic rate less than 2 per 10 high-power fields. Ki-67 less than 3%. Tumor invades through the muscularis propria into subserosal tissue. All margins negative. L VIN PNI present. Large mesenteric mass greater than 2 cm present. Regional lymph nodes 5 out of 14 were involved. pT3N2  Interval history-diarrhea and abdominal pain have improved.  Bowel movements are now regular.  She has some baseline fatigue but denies other complaints at this time.  ECOG PS- 1 Pain scale-  0   Review of systems- Review of Systems  Constitutional: Negative for chills, fever, malaise/fatigue and weight loss.  HENT: Negative for congestion, ear discharge and nosebleeds.   Eyes: Negative for blurred vision.  Respiratory: Negative for cough, hemoptysis, sputum production, shortness of breath and wheezing.   Cardiovascular: Negative for chest pain, palpitations, orthopnea and claudication.  Gastrointestinal: Negative for abdominal pain, blood in stool, constipation, diarrhea, heartburn, melena, nausea and vomiting.  Genitourinary: Negative for dysuria, flank pain, frequency, hematuria and urgency.  Musculoskeletal: Negative for back pain, joint pain and myalgias.  Skin: Negative for rash.  Neurological: Negative for dizziness, tingling, focal weakness, seizures, weakness and headaches.  Endo/Heme/Allergies: Does not bruise/bleed easily.  Psychiatric/Behavioral: Negative for depression and suicidal ideas. The patient does not have insomnia.       Allergies  Allergen Reactions   Adhesive [Tape] Dermatitis    Can not use plastic tape. Paper tape ok    Penicillin G Itching    Has patient had a PCN reaction causing immediate rash, facial/tongue/throat swelling, SOB or lightheadedness with hypotension: No Has patient had a PCN reaction causing severe rash involving mucus membranes or skin necrosis: No Has patient had a PCN reaction that required hospitalization: No Has patient had a PCN reaction occurring within the last 10 years: No If all of the above answers are "NO", then may proceed with Cephalosporin use.    Hydrocodone-Acetaminophen Itching and Rash     Past Medical History:  Diagnosis Date   Anxiety    Arthritis    thumbs, hands   Cancer ([place]) 1991   bilateral breast   Depression    GERD (gastroesophageal reflux disease)    Headache    occasional - pinched nerve in neck  Hypertension    Hypothyroidism    Thyroid disease    Wears dentures     full upper, partial lower     Past Surgical History:  Procedure Laterality Date   BOWEL RESECTION  01/17/2018   Procedure: SMALL BOWEL RESECTION;  Surgeon: Jules Husbands, MD;  Location: [facility];  Service: General;;   BREAST SURGERY Bilateral 1991   mastectomy   CATARACT EXTRACTION Bilateral    CHOLECYSTECTOMY     COLONOSCOPY  2013   Dr Aura Camps AUSTIN Right 06/03/2015   Procedure: HALLUX VALGUS AUSTIN;  Surgeon: Samara Deist, DPM;  Location: [place];  Service: Podiatry;  Laterality: Right;  WITH POPLITEAL   KNEE ARTHROSCOPY WITH MEDIAL MENISECTOMY Right 08/30/2016   Procedure: KNEE ARTHROSCOPY WITH PARTIAL MEDIAL MENISECTOMY and Partial Lateral Menisectomy;  Surgeon: Hessie Knows, MD;  Location: [facility];  Service: Orthopedics;  Laterality: Right;   KNEE ARTHROSCOPY WITH SUBCHONDROPLASTY Right 08/30/2016   Procedure: KNEE ARTHROSCOPY WITH SUBCHONDROPLASTY;  Surgeon: Hessie Knows, MD;  Location: [facility];  Service: Orthopedics;  Laterality: Right;   LAPAROTOMY N/A 01/17/2018   Procedure: EXPLORATORY LAPAROTOMY;  Surgeon: Jules Husbands, MD;  Location: [facility];  Service: General;  Laterality: N/A;   MASTECTOMY Bilateral    ROTATOR CUFF REPAIR Bilateral    TONSILLECTOMY     TOTAL KNEE ARTHROPLASTY Left    TOTAL KNEE ARTHROPLASTY Right 05/30/2017   Procedure: TOTAL KNEE ARTHROPLASTY;  Surgeon: Hessie Knows, MD;  Location: [facility];  Service: Orthopedics;  Laterality: Right;   VAGINAL HYSTERECTOMY      Social History   Socioeconomic History   Marital status: Soil scientist    Spouse name: Not on file   Number of children: 1   Years of education: Not on file   Highest education level: 12th grade  Occupational History   Occupation: Retired  Scientist, product/process development strain: Not hard at [employer]    Worry: Never true    Inability: Never true   Transportation needs    Medical: No    Non-medical: No   Tobacco Use   Smoking status: Former Smoker    Packs/day: 0.25    Years: 10.00    Pack years: 2.50    Types: Cigarettes    Quit date: 1991    Years since quitting: 29.4   Smokeless tobacco: Never Used   Tobacco comment: smoking cessation materials not required  Substance and Sexual Activity   Alcohol use: No    Alcohol/week: 0.0 standard drinks   Drug use: No   Sexual activity: Not Currently  Lifestyle   Physical activity    Days per week: 2 days    Minutes per session: 60 min   Stress: Not at all  Relationships   Social connections    Talks on phone: Patient refused    Gets together: Patient refused    Attends religious service: Patient refused    Active member of club or organization: Patient refused    Attends meetings of clubs or organizations: Patient refused    Relationship status: Patient refused   Intimate partner violence    Fear of current or ex partner: No    Emotionally abused: No    Physically abused: No    Forced sexual activity: No  Other Topics Concern   Not on file  Social History Narrative   Not on file    Family History  Problem Relation Age of Onset  Pulmonary embolism Mother    Healthy Father      Current Outpatient Medications:    acetaminophen (TYLENOL) 325 MG tablet, Take 1-2 tablets (325-650 mg total) by mouth every 6 (six) hours as needed for mild pain (pain score 1-3 or temp > 100.5)., Disp: , Rfl:    amLODipine (NORVASC) 5 MG tablet, Take 5 mg by mouth daily., Disp: , Rfl: 3   hydrochlorothiazide (HYDRODIURIL) 12.5 MG tablet, Take 1 tablet (12.5 mg total) by mouth daily., Disp: 90 tablet, Rfl: 0   levothyroxine (SYNTHROID) 50 MCG tablet, Take 1 tablet (50 mcg total) by mouth daily., Disp: 30 tablet, Rfl: 1   meloxicam (MOBIC) 7.5 MG tablet, Take 1 tablet (7.5 mg total) by mouth daily., Disp: 30 tablet, Rfl: 0   omeprazole (PRILOSEC) 20 MG capsule, Take 20 mg by mouth daily., Disp: , Rfl:    potassium chloride SA  (K-DUR) 20 MEQ tablet, Take 1 tablet (20 mEq total) by mouth daily for 10 days., Disp: 90 tablet, Rfl: 1  Physical exam:  Vitals:   08/24/18 1014  BP: 132/83  Pulse: 64  Resp: 18  Temp: 97.7 F (36.5 C)  Weight: 198 lb 4.8 oz (89.9 kg)   Physical Exam Constitutional:      General: She is not in acute distress. HENT:     Head: Normocephalic and atraumatic.  Eyes:     Pupils: Pupils are equal, round, and reactive to light.  Neck:     Musculoskeletal: Normal range of motion.  Cardiovascular:     Rate and Rhythm: Normal rate and regular rhythm.     Heart sounds: Normal heart sounds.  Pulmonary:     Effort: Pulmonary effort is normal.     Breath sounds: Normal breath sounds.  Abdominal:     General: Bowel sounds are normal.     Palpations: Abdomen is soft.  Skin:    General: Skin is warm and dry.  Neurological:     Mental Status: She is alert and oriented to person, place, and time.      CMP Latest Ref Rng & Units 08/20/2018  Glucose 70 - 99 mg/dL -  BUN 8 - 23 mg/dL -  Creatinine 0.44 - 1.00 mg/dL -  Sodium 135 - 145 mmol/L -  Potassium 3.5 - 5.2 mmol/L 4.1  Chloride 98 - 111 mmol/L -  CO2 22 - 32 mmol/L -  Calcium 8.9 - 10.3 mg/dL -  Total Protein 6.5 - 8.1 g/dL -  Total Bilirubin 0.3 - 1.2 mg/dL -  Alkaline Phos 38 - 126 U/L -  AST 15 - 41 U/L -  ALT 0 - 44 U/L -   CBC Latest Ref Rng & Units 08/15/2018  WBC 4.0 - 10.5 K/uL 5.9  Hemoglobin 12.0 - 15.0 g/dL 12.0  Hematocrit 36.0 - 46.0 % 36.6  Platelets 150 - 400 K/uL 181    No images are attached to the encounter.  Ct Chest W Contrast  Result Date: 08/15/2018 CLINICAL DATA:  History of carcinoid resection November 2019. EXAM: CT CHEST, ABDOMEN, AND PELVIS WITH CONTRAST TECHNIQUE: Multidetector CT imaging of the chest, abdomen and pelvis was performed following the standard protocol during bolus administration of intravenous contrast. CONTRAST:  197m OMNIPAQUE IOHEXOL 300 MG/ML  SOLN COMPARISON:  PET-CT  02/14/2018 and CT scan 01/15/2018 FINDINGS: CT CHEST FINDINGS Cardiovascular: The heart is normal in size and stable. No pericardial effusion. Stable tortuosity, mild ectasia and calcification of the thoracic aorta and branch vessels. Stable  three-vessel coronary artery calcifications. Mediastinum/Nodes: No mediastinal or hilar mass or lymphadenopathy. Small scattered lymph nodes are stable. The esophagus is grossly normal. Lungs/Pleura: Stable emphysematous changes and areas of pulmonary scarring. Stable 4 mm right lower lobe pulmonary nodule on image number 90 of series 4. Stable 7 mm left lower lobe pulmonary nodule on image number 92. Stable 3.5 mm right lower lobe pulmonary nodule on image number 104. Stable 3 mm right upper lobe pulmonary nodule on image number 41 No new pulmonary lesions. No acute pulmonary findings. No pleural effusion. No interstitial lung disease or bronchiectasis. Musculoskeletal: Status post bilateral mastectomies. No chest wall tumor, supraclavicular or axillary adenopathy. No significant bony findings. CT ABDOMEN PELVIS FINDINGS Hepatobiliary: Stable cirrhotic changes involving the liver but no focal hepatic lesions or intrahepatic biliary dilatation. Small calcified granuloma is noted. The gallbladder is surgically absent. No common bile duct dilatation. Pancreas: Stable cystic lesions in the pancreatic head. No new lesions. Moderate pancreatic atrophy. No acute inflammation Spleen: The spleen is upper limits of normal in size. No focal lesions. Adrenals/Urinary Tract: The adrenal glands are unremarkable and stable. The right kidney demonstrates chronic cortical scarring changes and a few calcifications. No mass or hydronephrosis. The left kidney demonstrates small exophytic lesions measuring 40-50 Hounsfield units. These were shown to be hemorrhagic cysts on prior MRI abdomen. No worrisome lesions or hydronephrosis. The bladder is unremarkable. Stomach/Bowel: The stomach, duodenum,  small bowel and colon are grossly normal. No acute inflammatory changes, mass lesions or obstructive findings. Surgical changes in the right lower quadrant from a previous small bowel resection. Advanced sigmoid colon diverticulosis but no findings for acute diverticulitis. Vascular/Lymphatic: Advanced atherosclerotic calcifications involving the aorta iliac arteries and branch vessels. No aneurysm or dissection. The major venous structures are patent. No mesenteric or retroperitoneal mass or adenopathy. No residual or recurrent right lower quadrant adenopathy. Reproductive: The uterus is surgically absent. Both ovaries are still present and appear normal. Small left ovarian cyst is noted. Other: No pelvic mass or adenopathy. No free pelvic fluid collections. No inguinal mass or adenopathy. No abdominal wall hernia or subcutaneous lesions. Musculoskeletal: No significant bony findings. No findings suspicious for metastatic disease. IMPRESSION: 1. Stable emphysematous changes and stable small scattered pulmonary nodules. No new findings and no mediastinal or hilar adenopathy. 2. Status post small bowel resection. No findings for residual or recurrent carcinoma tumor or adenopathy. No findings for metastatic disease involving the liver. 3. Stable cirrhotic changes involving the liver but no worrisome hepatic lesions. 4. Stable cystic lesions in the pancreatic head. 5. Stable hyperdense cyst involving the left kidney. 6. Stable advanced vascular disease. Electronically Signed   By: Marijo Sanes M.D.   On: 08/15/2018 14:29   Ct Abdomen Pelvis W Contrast  Result Date: 08/15/2018 CLINICAL DATA:  History of carcinoid resection November 2019. EXAM: CT CHEST, ABDOMEN, AND PELVIS WITH CONTRAST TECHNIQUE: Multidetector CT imaging of the chest, abdomen and pelvis was performed following the standard protocol during bolus administration of intravenous contrast. CONTRAST:  117m OMNIPAQUE IOHEXOL 300 MG/ML  SOLN COMPARISON:   PET-CT 02/14/2018 and CT scan 01/15/2018 FINDINGS: CT CHEST FINDINGS Cardiovascular: The heart is normal in size and stable. No pericardial effusion. Stable tortuosity, mild ectasia and calcification of the thoracic aorta and branch vessels. Stable three-vessel coronary artery calcifications. Mediastinum/Nodes: No mediastinal or hilar mass or lymphadenopathy. Small scattered lymph nodes are stable. The esophagus is grossly normal. Lungs/Pleura: Stable emphysematous changes and areas of pulmonary scarring. Stable 4 mm right lower lobe  pulmonary nodule on image number 90 of series 4. Stable 7 mm left lower lobe pulmonary nodule on image number 92. Stable 3.5 mm right lower lobe pulmonary nodule on image number 104. Stable 3 mm right upper lobe pulmonary nodule on image number 41 No new pulmonary lesions. No acute pulmonary findings. No pleural effusion. No interstitial lung disease or bronchiectasis. Musculoskeletal: Status post bilateral mastectomies. No chest wall tumor, supraclavicular or axillary adenopathy. No significant bony findings. CT ABDOMEN PELVIS FINDINGS Hepatobiliary: Stable cirrhotic changes involving the liver but no focal hepatic lesions or intrahepatic biliary dilatation. Small calcified granuloma is noted. The gallbladder is surgically absent. No common bile duct dilatation. Pancreas: Stable cystic lesions in the pancreatic head. No new lesions. Moderate pancreatic atrophy. No acute inflammation Spleen: The spleen is upper limits of normal in size. No focal lesions. Adrenals/Urinary Tract: The adrenal glands are unremarkable and stable. The right kidney demonstrates chronic cortical scarring changes and a few calcifications. No mass or hydronephrosis. The left kidney demonstrates small exophytic lesions measuring 40-50 Hounsfield units. These were shown to be hemorrhagic cysts on prior MRI abdomen. No worrisome lesions or hydronephrosis. The bladder is unremarkable. Stomach/Bowel: The stomach,  duodenum, small bowel and colon are grossly normal. No acute inflammatory changes, mass lesions or obstructive findings. Surgical changes in the right lower quadrant from a previous small bowel resection. Advanced sigmoid colon diverticulosis but no findings for acute diverticulitis. Vascular/Lymphatic: Advanced atherosclerotic calcifications involving the aorta iliac arteries and branch vessels. No aneurysm or dissection. The major venous structures are patent. No mesenteric or retroperitoneal mass or adenopathy. No residual or recurrent right lower quadrant adenopathy. Reproductive: The uterus is surgically absent. Both ovaries are still present and appear normal. Small left ovarian cyst is noted. Other: No pelvic mass or adenopathy. No free pelvic fluid collections. No inguinal mass or adenopathy. No abdominal wall hernia or subcutaneous lesions. Musculoskeletal: No significant bony findings. No findings suspicious for metastatic disease. IMPRESSION: 1. Stable emphysematous changes and stable small scattered pulmonary nodules. No new findings and no mediastinal or hilar adenopathy. 2. Status post small bowel resection. No findings for residual or recurrent carcinoma tumor or adenopathy. No findings for metastatic disease involving the liver. 3. Stable cirrhotic changes involving the liver but no worrisome hepatic lesions. 4. Stable cystic lesions in the pancreatic head. 5. Stable hyperdense cyst involving the left kidney. 6. Stable advanced vascular disease. Electronically Signed   By: Marijo Sanes M.D.   On: 08/15/2018 14:29     Assessment and plan- Patient is a 85 y.o. 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  She is here to discuss the results of her CT scan  I have reviewed CT chest abdomen pelvis images independently and discussed findings with the patient.  Patient was noted to have lung nodules on her prior CT scan which have been  followed and are essentially stable.CT abdomen does not reveal any evidence of recurrent metastatic disease.  We did check her chromogranin level which was previously 5 and now reported at 14 nmol/L.  I will plan to get a repeat chromogranin A level in 3 months and I will see her back in 6 months with repeat CT chest abdomen pelvis with contrast   Visit Diagnosis 1. Neuroendocrine carcinoma of small bowel ([place])   2. Lung nodules      Dr. Randa Evens, MD, MPH [facility] [phone] 08/27/2018 6:53 AM

## 2018-09-05 ENCOUNTER — Other Ambulatory Visit: Payer: Self-pay

## 2018-09-05 DIAGNOSIS — K279 Peptic ulcer, site unspecified, unspecified as acute or chronic, without hemorrhage or perforation: Secondary | ICD-10-CM

## 2018-09-05 DIAGNOSIS — F329 Major depressive disorder, single episode, unspecified: Secondary | ICD-10-CM

## 2018-09-05 DIAGNOSIS — F419 Anxiety disorder, unspecified: Secondary | ICD-10-CM

## 2018-09-05 DIAGNOSIS — F32A Depression, unspecified: Secondary | ICD-10-CM

## 2018-09-05 MED ORDER — OMEPRAZOLE 20 MG PO CPDR: 20.0000 mg | DELAYED_RELEASE_CAPSULE | Freq: Every day | ORAL | 5 refills | Status: DC

## 2018-09-05 MED ORDER — BUSPIRONE HCL 7.5 MG PO TABS: 7.5000 mg | ORAL_TABLET | Freq: Two times a day (BID) | ORAL | 1 refills | Status: DC

## 2018-09-05 NOTE — Progress Notes (Unsigned)
Sent in omeprazole and buspar

## 2018-09-25 ENCOUNTER — Other Ambulatory Visit: Payer: Self-pay

## 2018-09-25 ENCOUNTER — Encounter: Payer: Self-pay | Admitting: Family Medicine

## 2018-09-25 ENCOUNTER — Telehealth: Payer: Self-pay | Admitting: Family Medicine

## 2018-09-25 ENCOUNTER — Ambulatory Visit (INDEPENDENT_AMBULATORY_CARE_PROVIDER_SITE_OTHER): Payer: Medicare HMO | Admitting: Family Medicine

## 2018-09-25 VITALS — BP 138/80 | HR 60 | Ht 65.0 in | Wt 198.0 lb

## 2018-09-25 DIAGNOSIS — J0100 Acute maxillary sinusitis, unspecified: Secondary | ICD-10-CM

## 2018-09-25 MED ORDER — DOXYCYCLINE HYCLATE 100 MG PO TABS: 100.0000 mg | ORAL_TABLET | Freq: Two times a day (BID) | ORAL | 0 refills | Status: DC

## 2018-09-25 NOTE — Progress Notes (Signed)
Date:  09/25/2018   Name:  Kaitlyn Oliver   DOB:  02/08/1934   MRN:  [id number]   Chief Complaint: Sinusitis (L) sided head/ facial pressure. Glands in neck feel swollen)  Sinusitis This is a new problem. The current episode started in the past 7 days (4 days ago). The problem has been waxing and waning since onset. There has been no fever. The pain is moderate. Associated symptoms include congestion and sinus pressure. Pertinent negatives include no chills, coughing, diaphoresis, ear pain, headaches, hoarse voice, neck pain, shortness of breath, sneezing, sore throat or swollen glands. Past treatments include nothing. The treatment provided moderate relief.    Review of Systems  Constitutional: Negative.  Negative for chills, diaphoresis, fatigue, fever and unexpected weight change.  HENT: Positive for congestion and sinus pressure. Negative for ear discharge, ear pain, hoarse voice, rhinorrhea, sneezing and sore throat.   Eyes: Negative for photophobia, pain, discharge, redness and itching.  Respiratory: Negative for cough, shortness of breath, wheezing and stridor.   Gastrointestinal: Negative for abdominal pain, blood in stool, constipation, diarrhea, nausea and vomiting.  Endocrine: Negative for cold intolerance, heat intolerance, polydipsia, polyphagia and polyuria.  Genitourinary: Negative for dysuria, flank pain, frequency, hematuria, menstrual problem, pelvic pain, urgency, vaginal bleeding and vaginal discharge.  Musculoskeletal: Negative for arthralgias, back pain, myalgias and neck pain.  Skin: Negative for rash.  Allergic/Immunologic: Negative for environmental allergies and food allergies.  Neurological: Negative for dizziness, weakness, light-headedness, numbness and headaches.  Hematological: Negative for adenopathy. Does not bruise/bleed easily.  Psychiatric/Behavioral: Negative for dysphoric mood. The patient is not nervous/anxious.     Patient Active Problem List    Diagnosis Date Noted  . Hypokalemia 08/20/2018  . 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 Goals of care, counseling/discussion 01/26/2018  . Small bowel tumor   . Pancreatic cyst 01/15/2018  . Anxiety and depression 01/15/2018  . Pancreas cyst 01/15/2018  . Bowel obstruction ([place]) 11/29/2017  . Small bowel obstruction ([place])   . Thoracoabdominal aortic aneurysm (TAAA) ([place]) 11/19/2017  . Status post total knee replacement using cement, right 05/30/2017  . GERD (gastroesophageal reflux disease) 12/22/2015  . Adenomatous polyp 09/11/2014  . Bilateral cataracts 09/11/2014  . Gastric catarrh 09/11/2014  . Gastroduodenal ulcer 09/11/2014  . Hypothyroidism 09/11/2014  . Cardiac murmur 10/08/2013  . Essential (primary) hypertension 10/08/2013  . Combined fat and carbohydrate induced hyperlipemia 10/08/2013    Allergies  Allergen Reactions  . Adhesive [Tape] Dermatitis    Can not use plastic tape. Paper tape ok   . Penicillin G Itching    Has patient had a PCN reaction causing immediate rash, facial/tongue/throat swelling, SOB or lightheadedness with hypotension: No Has patient had a PCN reaction causing severe rash involving mucus membranes or skin necrosis: No Has patient had a PCN reaction that required hospitalization: No Has patient had a PCN reaction occurring within the last 10 years: No If all of the above answers are "NO", then may proceed with Cephalosporin use.   Marland Kitchen Hydrocodone-Acetaminophen Itching and Rash    Past Surgical History:  Procedure Laterality Date  . BOWEL RESECTION  01/17/2018   Procedure: SMALL BOWEL RESECTION;  Surgeon: Jules Husbands, MD;  Location: [facility];  Service: General;;  . BREAST SURGERY Bilateral 1991   mastectomy  . CATARACT EXTRACTION Bilateral   . CHOLECYSTECTOMY    . COLONOSCOPY  2013   Dr Gustavo Lah  . HALLUX VALGUS AUSTIN Right 06/03/2015   Procedure: HALLUX  VALGUS AUSTIN;  Surgeon: Samara Deist, DPM;  Location:  [facility];  Service: Podiatry;  Laterality: Right;  WITH POPLITEAL  . KNEE ARTHROSCOPY WITH MEDIAL MENISECTOMY Right 08/30/2016   Procedure: KNEE ARTHROSCOPY WITH PARTIAL MEDIAL MENISECTOMY and Partial Lateral Menisectomy;  Surgeon: Hessie Knows, MD;  Location: [facility];  Service: Orthopedics;  Laterality: Right;  . KNEE ARTHROSCOPY WITH SUBCHONDROPLASTY Right 08/30/2016   Procedure: KNEE ARTHROSCOPY WITH SUBCHONDROPLASTY;  Surgeon: Hessie Knows, MD;  Location: [facility];  Service: Orthopedics;  Laterality: Right;  . LAPAROTOMY N/A 01/17/2018   Procedure: EXPLORATORY LAPAROTOMY;  Surgeon: Jules Husbands, MD;  Location: [facility];  Service: General;  Laterality: N/A;  . MASTECTOMY Bilateral   . ROTATOR CUFF REPAIR Bilateral   . TONSILLECTOMY    . TOTAL KNEE ARTHROPLASTY Left   . TOTAL KNEE ARTHROPLASTY Right 05/30/2017   Procedure: TOTAL KNEE ARTHROPLASTY;  Surgeon: Hessie Knows, MD;  Location: [facility];  Service: Orthopedics;  Laterality: Right;  Marland Kitchen VAGINAL HYSTERECTOMY      Social History   Tobacco Use  . Smoking status: Former Smoker    Packs/day: 0.25    Years: 10.00    Pack years: 2.50    Types: Cigarettes    Quit date: 1991    Years since quitting: 29.5  . Smokeless tobacco: Never Used  . Tobacco comment: smoking cessation materials not required  Substance Use Topics  . Alcohol use: No    Alcohol/week: 0.0 standard drinks  . Drug use: No     Medication list has been reviewed and updated.  Current Meds  Medication Sig  . acetaminophen (TYLENOL) 325 MG tablet Take 1-2 tablets (325-650 mg total) by mouth every 6 (six) hours as needed for mild pain (pain score 1-3 or temp > 100.5).  Marland Kitchen amLODipine (NORVASC) 5 MG tablet Take 5 mg by mouth daily.  . busPIRone (BUSPAR) 7.5 MG tablet Take 1 tablet (7.5 mg total) by mouth 2 (two) times daily.  . hydrochlorothiazide (HYDRODIURIL) 12.5 MG tablet Take 1 tablet (12.5 mg total) by mouth daily.  Marland Kitchen levothyroxine (SYNTHROID) 50 MCG  tablet Take 1 tablet (50 mcg total) by mouth daily.  . meloxicam (MOBIC) 7.5 MG tablet Take 1 tablet (7.5 mg total) by mouth daily.  Marland Kitchen omeprazole (PRILOSEC) 20 MG capsule Take 1 capsule (20 mg total) by mouth daily.    PHQ 2/9 Scores 09/25/2018 02/02/2018 08/15/2017 04/06/2017  PHQ - 2 Score 0 0 0 0  PHQ- 9 Score 0 0 1 -    BP Readings from Last 3 Encounters:  09/25/18 138/80  08/24/18 132/83  08/20/18 130/80    Physical Exam Vitals signs and nursing note reviewed.  Constitutional:      Appearance: She is well-developed.  HENT:     Head: Normocephalic.     Right Ear: Tympanic membrane, ear canal and external ear normal.     Left Ear: Tympanic membrane, ear canal and external ear normal.     Nose: Nose normal.  Eyes:     General: Lids are everted, no foreign bodies appreciated. No scleral icterus.       Left eye: No foreign body or hordeolum.     Conjunctiva/sclera: Conjunctivae normal.     Right eye: Right conjunctiva is not injected.     Left eye: Left conjunctiva is not injected.     Pupils: Pupils are equal, round, and reactive to light.  Neck:     Musculoskeletal: Normal range of motion and neck supple.  Thyroid: No thyromegaly.     Vascular: No JVD.     Trachea: No tracheal deviation.  Cardiovascular:     Rate and Rhythm: Normal rate and regular rhythm.     Heart sounds: Normal heart sounds. No murmur. No friction rub. No gallop.   Pulmonary:     Effort: Pulmonary effort is normal. No respiratory distress.     Breath sounds: Normal breath sounds. No wheezing or rales.  Abdominal:     General: Bowel sounds are normal.     Palpations: Abdomen is soft. There is no mass.     Tenderness: There is no abdominal tenderness. There is no guarding or rebound.  Musculoskeletal: Normal range of motion.        General: No tenderness.  Lymphadenopathy:     Cervical: No cervical adenopathy.  Skin:    General: Skin is warm.     Findings: No rash.  Neurological:     Mental  Status: She is alert and oriented to person, place, and time.     Cranial Nerves: No cranial nerve deficit.     Deep Tendon Reflexes: Reflexes normal.  Psychiatric:        Mood and Affect: Mood is not anxious or depressed.     Wt Readings from Last 3 Encounters:  09/25/18 198 lb (89.8 kg)  08/24/18 198 lb 4.8 oz (89.9 kg)  08/20/18 190 lb (86.2 kg)    BP 138/80   Pulse 60   Ht 5\' 5"  (1.651 m)   Wt 198 lb (89.8 kg)   BMI 32.95 kg/m   Assessment and Plan:  1. Acute non-recurrent maxillary sinusitis Acute.  Persistent.  Patient has discomfort in the left maxillary sinus area.  Patient will initiate doxycycline 100 mg twice a day for 10 days. - doxycycline (VIBRA-TABS) 100 MG tablet; Take 1 tablet (100 mg total) by mouth 2 (two) times daily.  Dispense: 20 tablet; Refill: 0

## 2018-09-25 NOTE — Telephone Encounter (Signed)
She needs to be seen, I don't know what that could be- usually not connected?

## 2018-09-25 NOTE — Telephone Encounter (Signed)
Patient called in and is having headaches and mouth soreness.

## 2018-10-12 ENCOUNTER — Other Ambulatory Visit: Payer: Self-pay | Admitting: Family Medicine

## 2018-11-18 ENCOUNTER — Other Ambulatory Visit: Payer: Self-pay | Admitting: Family Medicine

## 2018-11-18 DIAGNOSIS — E034 Atrophy of thyroid (acquired): Secondary | ICD-10-CM

## 2018-11-22 DIAGNOSIS — R69 Illness, unspecified: Secondary | ICD-10-CM | POA: Diagnosis not present

## 2018-11-23 ENCOUNTER — Other Ambulatory Visit: Payer: Self-pay

## 2018-11-26 ENCOUNTER — Other Ambulatory Visit: Payer: Self-pay

## 2018-11-26 ENCOUNTER — Inpatient Hospital Stay: Payer: Medicare HMO | Attending: Hematology and Oncology

## 2018-11-26 ENCOUNTER — Ambulatory Visit: Payer: Medicare HMO | Admitting: Oncology

## 2018-11-26 DIAGNOSIS — Z87891 Personal history of nicotine dependence: Secondary | ICD-10-CM | POA: Diagnosis not present

## 2018-11-26 DIAGNOSIS — C7A8 Other malignant neuroendocrine tumors: Secondary | ICD-10-CM | POA: Diagnosis not present

## 2018-11-26 DIAGNOSIS — K219 Gastro-esophageal reflux disease without esophagitis: Secondary | ICD-10-CM | POA: Diagnosis not present

## 2018-11-26 DIAGNOSIS — R945 Abnormal results of liver function studies: Secondary | ICD-10-CM | POA: Insufficient documentation

## 2018-11-26 DIAGNOSIS — Z79899 Other long term (current) drug therapy: Secondary | ICD-10-CM | POA: Diagnosis not present

## 2018-11-26 DIAGNOSIS — E039 Hypothyroidism, unspecified: Secondary | ICD-10-CM | POA: Diagnosis not present

## 2018-11-26 DIAGNOSIS — I10 Essential (primary) hypertension: Secondary | ICD-10-CM | POA: Insufficient documentation

## 2018-11-26 LAB — CBC WITH DIFFERENTIAL/PLATELET
Abs Immature Granulocytes: 0.01 10*3/uL (ref 0.00–0.07)
Basophils Absolute: 0 10*3/uL (ref 0.0–0.1)
Basophils Relative: 1 %
Eosinophils Absolute: 0.1 10*3/uL (ref 0.0–0.5)
Eosinophils Relative: 2 %
HCT: 36.1 % (ref 36.0–46.0)
Hemoglobin: 12.2 g/dL (ref 12.0–15.0)
Immature Granulocytes: 0 %
Lymphocytes Relative: 33 %
Lymphs Abs: 1.7 10*3/uL (ref 0.7–4.0)
MCH: 29.5 pg (ref 26.0–34.0)
MCHC: 33.8 g/dL (ref 30.0–36.0)
MCV: 87.4 fL (ref 80.0–100.0)
Monocytes Absolute: 0.4 10*3/uL (ref 0.1–1.0)
Monocytes Relative: 9 %
Neutro Abs: 2.9 10*3/uL (ref 1.7–7.7)
Neutrophils Relative %: 55 %
Platelets: 146 10*3/uL — ABNORMAL LOW (ref 150–400)
RBC: 4.13 MIL/uL (ref 3.87–5.11)
RDW: 15.8 % — ABNORMAL HIGH (ref 11.5–15.5)
WBC: 5.1 10*3/uL (ref 4.0–10.5)
nRBC: 0 % (ref 0.0–0.2)

## 2018-11-26 LAB — COMPREHENSIVE METABOLIC PANEL
ALT: 48 U/L — ABNORMAL HIGH (ref 0–44)
AST: 61 U/L — ABNORMAL HIGH (ref 15–41)
Albumin: 4 g/dL (ref 3.5–5.0)
Alkaline Phosphatase: 65 U/L (ref 38–126)
Anion gap: 11 (ref 5–15)
BUN: 15 mg/dL (ref 8–23)
CO2: 25 mmol/L (ref 22–32)
Calcium: 9.6 mg/dL (ref 8.9–10.3)
Chloride: 100 mmol/L (ref 98–111)
Creatinine, Ser: 0.99 mg/dL (ref 0.44–1.00)
GFR calc Af Amer: >60 mL/min (ref >60)
GFR calc non Af Amer: 53 mL/min — ABNORMAL LOW (ref >60)
Glucose, Bld: 123 mg/dL — ABNORMAL HIGH (ref 70–99)
Potassium: 3.6 mmol/L (ref 3.5–5.1)
Sodium: 136 mmol/L (ref 135–145)
Total Bilirubin: 0.7 mg/dL (ref 0.3–1.2)
Total Protein: 7.3 g/dL (ref 6.5–8.1)

## 2018-11-27 LAB — CHROMOGRANIN A: Chromogranin A (ng/mL): 625.7 ng/mL — ABNORMAL HIGH (ref 0.0–101.8)

## 2018-11-30 ENCOUNTER — Other Ambulatory Visit: Payer: Self-pay

## 2018-11-30 ENCOUNTER — Inpatient Hospital Stay (HOSPITAL_BASED_OUTPATIENT_CLINIC_OR_DEPARTMENT_OTHER): Payer: Medicare HMO | Admitting: Oncology

## 2018-11-30 ENCOUNTER — Encounter: Payer: Self-pay | Admitting: Oncology

## 2018-11-30 DIAGNOSIS — Z853 Personal history of malignant neoplasm of breast: Secondary | ICD-10-CM

## 2018-11-30 DIAGNOSIS — K219 Gastro-esophageal reflux disease without esophagitis: Secondary | ICD-10-CM | POA: Diagnosis not present

## 2018-11-30 DIAGNOSIS — Z87891 Personal history of nicotine dependence: Secondary | ICD-10-CM

## 2018-11-30 DIAGNOSIS — R945 Abnormal results of liver function studies: Secondary | ICD-10-CM

## 2018-11-30 DIAGNOSIS — R7989 Other specified abnormal findings of blood chemistry: Secondary | ICD-10-CM

## 2018-11-30 DIAGNOSIS — C7A8 Other malignant neuroendocrine tumors: Secondary | ICD-10-CM

## 2018-11-30 DIAGNOSIS — Z79899 Other long term (current) drug therapy: Secondary | ICD-10-CM | POA: Diagnosis not present

## 2018-11-30 DIAGNOSIS — I10 Essential (primary) hypertension: Secondary | ICD-10-CM | POA: Diagnosis not present

## 2018-11-30 NOTE — Progress Notes (Addendum)
I connected with Kaitlyn Oliver on 11/30/18 at  8:45 AM EDT by video enabled telemedicine visit and verified that I am speaking with the correct person using two identifiers.   I discussed the limitations, risks, security and privacy concerns of performing an evaluation and management service by telemedicine and the availability of in-person appointments. I also discussed with the patient that there may be a patient responsible charge related to this service. The patient expressed understanding and agreed to proceed.  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 Patient also was admitted for an episode of bowel obstruction about 2 months ago which resolved conservatively. CT abdomen done during this admission showed an abnormal small bowel loop in the right lower quadrant and adjacent 4 cm necrotic mesenteric lesion which could be a potential lymph node. Patient also noted to have hypoattenuating cystic lesions in the head of the pancreas. Hepatic cirrhosis. This was followed by MRI of the abdomen with and without contrast which again showed multiple cystic lesions in the pancreas either pancreatic pseudocyst or cystic neoplasm of the pancreas.  Patient had exploratory laparotomy and resection of the small bowel tumor. Pathology showed well-differentiated neuroendocrine tumor grade 1, 3.7 cm mitotic rate less than 2 per 10 high-power fields. Ki-67 less than 3%. Tumor invades through the muscularis propria into subserosal tissue. All  margins negative. L VIN PNI present. Large mesenteric mass greater than 2 cm present. Regional lymph nodes 5 out of 14 were involved. pT3N2  Interval history: She feels well and denies any symptoms of flushing abdominal pain diarrhea or palpitations.  Her appetite and weight have been stable.   Review of Systems  Constitutional: Negative for chills, fever, malaise/fatigue and weight loss.  HENT: Negative for congestion, ear discharge and nosebleeds.   Eyes: Negative for blurred vision.  Respiratory: Negative for cough, hemoptysis, sputum production, shortness of breath and wheezing.   Cardiovascular: Negative for chest pain, palpitations, orthopnea and claudication.  Gastrointestinal: Negative for abdominal pain, blood in stool, constipation, diarrhea, heartburn, melena, nausea and vomiting.  Genitourinary: Negative for dysuria, flank pain, frequency, hematuria and urgency.  Musculoskeletal: Negative for back pain, joint pain and myalgias.  Skin: Negative for rash.  Neurological: Negative for dizziness, tingling, focal weakness, seizures, weakness and headaches.  Endo/Heme/Allergies: Does not bruise/bleed easily.  Psychiatric/Behavioral: Negative for depression and suicidal ideas. The patient does not have insomnia.     Allergies  Allergen Reactions  . Adhesive [Tape] Dermatitis    Can not use plastic tape. Paper tape ok   . Penicillin G Itching    Has patient had a PCN reaction causing immediate rash, facial/tongue/throat swelling, SOB or lightheadedness with hypotension: No Has patient had a PCN reaction causing severe rash involving mucus membranes or skin necrosis: No Has patient had a PCN reaction that required hospitalization: No Has patient had a PCN reaction occurring within the last 10 years: No If all of the above answers are "NO", then may proceed with Cephalosporin use.   Marland Kitchen Hydrocodone-Acetaminophen Itching and Rash    Past Medical History:  Diagnosis Date  .  Anxiety   . Arthritis    thumbs, hands  . Cancer [facility]) 1991   bilateral breast  . Depression   . GERD (gastroesophageal reflux disease)   . Headache    occasional - pinched nerve in neck  . Hypertension   . Hypothyroidism   . Thyroid disease   . Wears dentures    full upper, partial lower    Past Surgical History:  Procedure Laterality Date  . BOWEL RESECTION  01/17/2018   Procedure: SMALL BOWEL RESECTION;  Surgeon: Jules Husbands, MD;  Location: [facility];  Service: General;;  . BREAST SURGERY Bilateral 1991   mastectomy  . CATARACT EXTRACTION Bilateral   . CHOLECYSTECTOMY    . COLONOSCOPY  2013   Dr Gustavo Lah  . HALLUX VALGUS AUSTIN Right 06/03/2015   Procedure: HALLUX VALGUS AUSTIN;  Surgeon: Samara Deist, DPM;  Location: [place];  Service: Podiatry;  Laterality: Right;  WITH POPLITEAL  . KNEE ARTHROSCOPY WITH MEDIAL MENISECTOMY Right 08/30/2016   Procedure: KNEE ARTHROSCOPY WITH PARTIAL MEDIAL MENISECTOMY and Partial Lateral Menisectomy;  Surgeon: Hessie Knows, MD;  Location: [facility];  Service: Orthopedics;  Laterality: Right;  . KNEE ARTHROSCOPY WITH SUBCHONDROPLASTY Right 08/30/2016   Procedure: KNEE ARTHROSCOPY WITH SUBCHONDROPLASTY;  Surgeon: Hessie Knows, MD;  Location: [facility];  Service: Orthopedics;  Laterality: Right;  . LAPAROTOMY N/A 01/17/2018   Procedure: EXPLORATORY LAPAROTOMY;  Surgeon: Jules Husbands, MD;  Location: [facility];  Service: General;  Laterality: N/A;  . MASTECTOMY Bilateral   . ROTATOR CUFF REPAIR Bilateral   . TONSILLECTOMY    . TOTAL KNEE ARTHROPLASTY Left   . TOTAL KNEE ARTHROPLASTY Right 05/30/2017   Procedure: TOTAL KNEE ARTHROPLASTY;  Surgeon: Hessie Knows, MD;  Location: [facility];  Service: Orthopedics;  Laterality: Right;  Marland Kitchen VAGINAL HYSTERECTOMY      Social History   Socioeconomic History  . Marital status: Soil scientist    Spouse name: Not on file  . Number of children: 1  . Years of education: Not on file  .  Highest education level: 12th grade  Occupational History  . Occupation: Retired  Scientific laboratory technician  . Financial resource strain: Not hard at all  . Food insecurity    Worry: Never true    Inability: Never true  . Transportation needs    Medical: No    Non-medical: No  Tobacco Use  . Smoking status: Former Smoker    Packs/day: 0.25    Years: 10.00    Pack years: 2.50    Types: Cigarettes    Quit date: 1991    Years since quitting: 29.7  . Smokeless tobacco: Never Used  . Tobacco comment: smoking cessation materials not required  Substance and Sexual Activity  . Alcohol use: No    Alcohol/week: 0.0 standard drinks  . Drug use: No  . Sexual activity: Not Currently  Lifestyle  . Physical activity    Days per week: 2 days    Minutes per session: 60 min  . Stress: Not at all  Relationships  . Social Herbalist on phone: Patient refused    Gets together: Patient refused    Attends religious service: Patient refused    Active member of club or organization: Patient refused    Attends meetings of clubs or organizations: Patient refused    Relationship status: Patient refused  . Intimate partner violence    Fear of current or ex partner: No    Emotionally abused: No  Physically abused: No    Forced sexual activity: No  Other Topics Concern  . Not on file  Social History Narrative  . Not on file    Family History  Problem Relation Age of Onset  . Pulmonary embolism Mother   . Healthy Father      Current Outpatient Medications:  .  acetaminophen (TYLENOL) 325 MG tablet, Take 1-2 tablets (325-650 mg total) by mouth every 6 (six) hours as needed for mild pain (pain score 1-3 or temp > 100.5)., Disp: , Rfl:  .  amLODipine (NORVASC) 5 MG tablet, Take 5 mg by mouth daily., Disp: , Rfl: 3 .  busPIRone (BUSPAR) 7.5 MG tablet, Take 1 tablet (7.5 mg total) by mouth 2 (two) times daily. (Patient taking differently: Take 7.5 mg by mouth 2 (two) times daily as needed. ),  Disp: 60 tablet, Rfl: 1 .  EUTHYROX 50 MCG tablet, Take 1 tablet by mouth once daily, Disp: 30 tablet, Rfl: 0 .  hydrochlorothiazide (HYDRODIURIL) 12.5 MG tablet, Take 1 tablet by mouth once daily, Disp: 90 tablet, Rfl: 0 .  omeprazole (PRILOSEC) 20 MG capsule, Take 1 capsule (20 mg total) by mouth daily., Disp: 30 capsule, Rfl: 5  No results found.  No images are attached to the encounter.   CMP Latest Ref Rng & Units 11/26/2018  Glucose 70 - 99 mg/dL 123(H)  BUN 8 - 23 mg/dL 15  Creatinine 0.44 - 1.00 mg/dL 0.99  Sodium 135 - 145 mmol/L 136  Potassium 3.5 - 5.1 mmol/L 3.6  Chloride 98 - 111 mmol/L 100  CO2 22 - 32 mmol/L 25  Calcium 8.9 - 10.3 mg/dL 9.6  Total Protein 6.5 - 8.1 g/dL 7.3  Total Bilirubin 0.3 - 1.2 mg/dL 0.7  Alkaline Phos 38 - 126 U/L 65  AST 15 - 41 U/L 61(H)  ALT 0 - 44 U/L 48(H)   CBC Latest Ref Rng & Units 11/26/2018  WBC 4.0 - 10.5 K/uL 5.1  Hemoglobin 12.0 - 15.0 g/dL 12.2  Hematocrit 36.0 - 46.0 % 36.1  Platelets 150 - 400 K/uL 146(L)     Observation/objective: Appears in no acute distress of a video visit today.  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  Patient had a last scan in June 2020 which did not show any evidence of recurrence or metastatic disease from her carcinoid.  However her chromogranin A levels continue to remain elevatedLevels on 11/26/2018 was 625 which were elevated as compared to a prior level of 596 in June 2020.  Even when her levels were elevated in June scans were negative for any active carcinoid.  Discussed that we could proceed getting a dotatate scan at this time given her persistently elevated chromogranin A levels to see if she has any evidence of distant metastatic disease since small bowel carcinoid's can recur frequently  2. Abnormal LFT's: He has had mild persistent elevation of her AST and ALT in the  past.  She sees Dr. Gustavo Lah.  We will get in touch with [facility] GI to follow-up on the LFT abnormality  Follow-up instructions: I will call her with the results of the repeat scan.  She will keep her appointment with me in December as scheduled  I discussed the assessment and treatment plan with the patient. The patient was provided an opportunity to ask questions and all were answered. The patient agreed with the  plan and demonstrated an understanding of the instructions.   The patient was advised to call back or seek an in-person evaluation if the symptoms worsen or if the condition fails to improve as anticipated.   Visit Diagnosis: 1. 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 Abnormal LFTs     Dr. Randa Evens, MD, MPH [facility] [phone] 11/30/2018 10:01 AM

## 2018-11-30 NOTE — Progress Notes (Signed)
Pt to have video visit to get results of labs.

## 2018-12-03 DIAGNOSIS — R69 Illness, unspecified: Secondary | ICD-10-CM | POA: Diagnosis not present

## 2018-12-10 DIAGNOSIS — C50111 Malignant neoplasm of central portion of right female breast: Secondary | ICD-10-CM | POA: Diagnosis not present

## 2018-12-10 DIAGNOSIS — C50112 Malignant neoplasm of central portion of left female breast: Secondary | ICD-10-CM | POA: Diagnosis not present

## 2018-12-12 ENCOUNTER — Encounter: Payer: Self-pay | Admitting: *Deleted

## 2018-12-12 NOTE — Telephone Encounter (Signed)
I called pt about appt as well as send a my chart message. I did leave message for pt. She called back and she is agreeable [facility] at 2:30.  She knows to come 15 min early and that a staff member will call her Friday before scan to go over instructions.

## 2018-12-17 ENCOUNTER — Other Ambulatory Visit: Payer: Self-pay

## 2018-12-17 ENCOUNTER — Encounter
Admission: RE | Admit: 2018-12-17 | Discharge: 2018-12-17 | Disposition: A | Discharge status: Home / Self Care | Payer: Medicare HMO | Source: Ambulatory Visit | Attending: Oncology | Admitting: Oncology

## 2018-12-17 DIAGNOSIS — C7A019 Malignant carcinoid tumor of the small intestine, unspecified portion: Secondary | ICD-10-CM | POA: Diagnosis not present

## 2018-12-17 DIAGNOSIS — C7A8 Other malignant neuroendocrine tumors: Secondary | ICD-10-CM | POA: Diagnosis not present

## 2018-12-17 MED ORDER — GALLIUM GA 68 DOTATATE IV KIT
4.8500 | PACK | Freq: Once | INTRAVENOUS | Status: AC | PRN
  Administered 2018-12-17: 5.25 via INTRAVENOUS

## 2018-12-18 ENCOUNTER — Telehealth: Payer: Self-pay | Admitting: *Deleted

## 2018-12-18 NOTE — Telephone Encounter (Signed)
Called pt's house and got voicemail. Left a message that he pet scan did not show any cancer. Since this scan was negative, MD would like to move the scan she has scheduled for dec of this year to March of next year. I asked pt to call me back after she gets this message so we can communicate to each other.

## 2019-01-16 ENCOUNTER — Telehealth: Payer: Self-pay | Admitting: *Deleted

## 2019-01-16 NOTE — Telephone Encounter (Signed)
-----   Message from Sindy Guadeloupe, MD sent at 12/18/2018  8:29 AM EDT ----- Please let her know that scan did not show any evidence of her prior tumor. We can move her next scan to march instead of December. Thanks, Presenter, broadcasting

## 2019-01-16 NOTE — Telephone Encounter (Signed)
Called pt again today since I had not heard from her on my last call to her and message left but she did not call me back. Today the pt answered and I asked her if she got my message that her scan did not show any tumor and instead of doing scan in December , Dr. Janese Banks is rec: to move it to march and see her then. Pt. Did get my message and is ok with moving scan and md appt to March 2021. I told her that I will send message to scheduler and she will make the new appt and send the appts in the mail to pt and she is agreeable.

## 2019-01-30 ENCOUNTER — Other Ambulatory Visit: Payer: Self-pay | Admitting: Family Medicine

## 2019-01-30 DIAGNOSIS — E034 Atrophy of thyroid (acquired): Secondary | ICD-10-CM

## 2019-02-05 ENCOUNTER — Telehealth: Payer: Self-pay

## 2019-02-05 NOTE — Telephone Encounter (Signed)
Pt called in stating her brother died unexpectedly this morning. "could y'all call me something in?" We called in Alprazolam 0.25mg  q12 hrs #30 to [place]

## 2019-02-12 ENCOUNTER — Other Ambulatory Visit: Payer: Self-pay

## 2019-02-12 DIAGNOSIS — K279 Peptic ulcer, site unspecified, unspecified as acute or chronic, without hemorrhage or perforation: Secondary | ICD-10-CM

## 2019-02-12 MED ORDER — OMEPRAZOLE 20 MG PO CPDR: 20.0000 mg | DELAYED_RELEASE_CAPSULE | Freq: Every day | ORAL | 0 refills | Status: DC

## 2019-02-20 ENCOUNTER — Ambulatory Visit: Payer: Medicare HMO

## 2019-02-20 ENCOUNTER — Other Ambulatory Visit: Payer: Medicare HMO

## 2019-02-22 ENCOUNTER — Ambulatory Visit: Payer: Medicare HMO | Admitting: Oncology

## 2019-02-26 DIAGNOSIS — I10 Essential (primary) hypertension: Secondary | ICD-10-CM | POA: Diagnosis not present

## 2019-02-26 DIAGNOSIS — I712 Thoracic aortic aneurysm, without rupture: Secondary | ICD-10-CM | POA: Diagnosis not present

## 2019-02-26 DIAGNOSIS — E782 Mixed hyperlipidemia: Secondary | ICD-10-CM | POA: Diagnosis not present

## 2019-02-26 DIAGNOSIS — I25118 Atherosclerotic heart disease of native coronary artery with other forms of angina pectoris: Secondary | ICD-10-CM | POA: Diagnosis not present

## 2019-03-01 ENCOUNTER — Encounter: Payer: Self-pay | Admitting: Family Medicine

## 2019-03-01 ENCOUNTER — Other Ambulatory Visit: Payer: Self-pay

## 2019-03-01 ENCOUNTER — Ambulatory Visit (INDEPENDENT_AMBULATORY_CARE_PROVIDER_SITE_OTHER): Payer: Medicare HMO | Admitting: Family Medicine

## 2019-03-01 VITALS — BP 120/64 | HR 64 | Ht 65.0 in | Wt 198.0 lb

## 2019-03-01 DIAGNOSIS — K279 Peptic ulcer, site unspecified, unspecified as acute or chronic, without hemorrhage or perforation: Secondary | ICD-10-CM

## 2019-03-01 DIAGNOSIS — F329 Major depressive disorder, single episode, unspecified: Secondary | ICD-10-CM

## 2019-03-01 DIAGNOSIS — F32A Depression, unspecified: Secondary | ICD-10-CM

## 2019-03-01 DIAGNOSIS — I10 Essential (primary) hypertension: Secondary | ICD-10-CM

## 2019-03-01 DIAGNOSIS — R69 Illness, unspecified: Secondary | ICD-10-CM | POA: Diagnosis not present

## 2019-03-01 DIAGNOSIS — F419 Anxiety disorder, unspecified: Secondary | ICD-10-CM

## 2019-03-01 DIAGNOSIS — E034 Atrophy of thyroid (acquired): Secondary | ICD-10-CM | POA: Diagnosis not present

## 2019-03-01 DIAGNOSIS — D509 Iron deficiency anemia, unspecified: Secondary | ICD-10-CM

## 2019-03-01 MED ORDER — AMLODIPINE BESYLATE 5 MG PO TABS: 5.0000 mg | ORAL_TABLET | Freq: Every day | ORAL | 1 refills | Status: DC

## 2019-03-01 MED ORDER — BUSPIRONE HCL 7.5 MG PO TABS: 7.5000 mg | ORAL_TABLET | Freq: Two times a day (BID) | ORAL | 1 refills | Status: DC

## 2019-03-01 MED ORDER — HYDROCHLOROTHIAZIDE 12.5 MG PO TABS: 12.5000 mg | ORAL_TABLET | Freq: Every day | ORAL | 1 refills | Status: DC

## 2019-03-01 MED ORDER — PANTOPRAZOLE SODIUM 40 MG PO TBEC: 40.0000 mg | DELAYED_RELEASE_TABLET | Freq: Every day | ORAL | 1 refills | Status: DC

## 2019-03-01 NOTE — Progress Notes (Signed)
Date:  03/01/2019   Name:  Kaitlyn Oliver   DOB:  [birth year]   MRN:  [id number]   Chief Complaint: Hypertension, Gastroesophageal Reflux, Anxiety (PHQ9=4 and GAD7=0), and Anemia (low iron when tried to give blood)  Hypertension This is a chronic problem. The current episode started more than 1 year ago. The problem has been gradually improving since onset. The problem is controlled. Associated symptoms include anxiety. Pertinent negatives include no blurred vision, chest pain, headaches, malaise/fatigue, neck pain, orthopnea, palpitations, peripheral edema, PND, shortness of breath or sweats. There are no associated agents to hypertension. There are no known risk factors for coronary artery disease. Past treatments include calcium channel blockers and diuretics. The current treatment provides moderate improvement. There are no compliance problems.  There is no history of angina, kidney disease, CAD/MI, CVA, heart failure, left ventricular hypertrophy, PVD or retinopathy. There is no history of chronic renal disease, a hypertension causing med or renovascular disease.  Gastroesophageal Reflux She reports no abdominal pain, no belching, no chest pain, no choking, no coughing, no dysphagia, no heartburn, no hoarse voice, no nausea, no sore throat or no wheezing. This is a chronic problem. The current episode started more than 1 year ago. The problem occurs occasionally. The problem has been gradually improving. The symptoms are aggravated by certain foods. Pertinent negatives include no anemia, fatigue, melena, muscle weakness, orthopnea or weight loss. nausea. She has tried a PPI for the symptoms. The treatment provided moderate relief.  Anxiety Presents for follow-up visit. Symptoms include excessive worry and nervous/anxious behavior. Patient reports no chest pain, compulsions, confusion, decreased concentration, depressed mood, dizziness, dry mouth, feeling of choking, hyperventilation,  impotence, insomnia, irritability, malaise, muscle tension, nausea, obsessions, palpitations, panic, restlessness, shortness of breath or suicidal ideas. Symptoms occur occasionally. The severity of symptoms is mild. The quality of sleep is good.   Her past medical history is significant for anemia.  Anemia Presents for follow-up visit. There has been no abdominal pain, anorexia, bruising/bleeding easily, confusion, fever, leg swelling, light-headedness, malaise/fatigue, pallor, palpitations, paresthesias or weight loss. Signs of blood loss that are not present include hematemesis, hematochezia, melena, menorrhagia and vaginal bleeding. There is no history of chronic renal disease or heart failure. There are no compliance problems.     Lab Results  Component Value Date   CREATININE 0.99 11/26/2018   BUN 15 11/26/2018   NA 136 11/26/2018   K 3.6 11/26/2018   CL 100 11/26/2018   CO2 25 11/26/2018   Lab Results  Component Value Date   CHOL 227 (H) 02/15/2017   HDL 33 (L) 02/15/2017   LDLCALC Comment 02/15/2017   TRIG 534 (H) 02/15/2017   CHOLHDL 6.9 (H) 02/15/2017   Lab Results  Component Value Date   TSH 7.080 (H) 08/20/2018   No results found for: HGBA1C   Review of Systems  Constitutional: Negative for chills, fatigue, fever, irritability, malaise/fatigue and weight loss.  HENT: Negative for drooling, ear discharge, ear pain, hoarse voice and sore throat.   Eyes: Negative for blurred vision.  Respiratory: Negative for cough, choking, shortness of breath and wheezing.   Cardiovascular: Negative for chest pain, palpitations, orthopnea, leg swelling and PND.  Gastrointestinal: Negative for abdominal pain, anorexia, blood in stool, constipation, diarrhea, dysphagia, heartburn, hematemesis, hematochezia, melena and nausea.  Endocrine: Negative for polydipsia.  Genitourinary: Negative for dysuria, frequency, hematuria, impotence, menorrhagia, urgency and vaginal bleeding.    Musculoskeletal: Negative for back pain, myalgias, muscle weakness and neck pain.  Skin: Negative for pallor and rash.  Allergic/Immunologic: Negative for environmental allergies.  Neurological: Negative for dizziness, light-headedness, headaches and paresthesias.  Hematological: Does not bruise/bleed easily.  Psychiatric/Behavioral: Negative for confusion, decreased concentration and suicidal ideas. The patient is nervous/anxious. The patient does not have insomnia.     Patient Active Problem List   Diagnosis Date Noted  . Hypokalemia 08/20/2018  . 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 Goals of care, counseling/discussion 01/26/2018  . Small bowel tumor   . Pancreatic cyst 01/15/2018  . Anxiety and depression 01/15/2018  . Pancreas cyst 01/15/2018  . Bowel obstruction ([place]) 11/29/2017  . Small bowel obstruction ([place])   . 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 Status post total knee replacement using cement, right 05/30/2017  . GERD (gastroesophageal reflux disease) 12/22/2015  . Adenomatous polyp 09/11/2014  . Bilateral cataracts 09/11/2014  . Gastric catarrh 09/11/2014  . Gastroduodenal ulcer 09/11/2014  . Hypothyroidism 09/11/2014  . Cardiac murmur 10/08/2013  . Essential (primary) hypertension 10/08/2013  . Combined fat and carbohydrate induced hyperlipemia 10/08/2013    Allergies  Allergen Reactions  . Adhesive [Tape] Dermatitis    Can not use plastic tape. Paper tape ok   . Penicillin G Itching    Has patient had a PCN reaction causing immediate rash, facial/tongue/throat swelling, SOB or lightheadedness with hypotension: No Has patient had a PCN reaction causing severe rash involving mucus membranes or skin necrosis: No Has patient had a PCN reaction that required hospitalization: No Has patient had a PCN reaction occurring within the last 10 years: No If all of the above answers are "NO", then may proceed with Cephalosporin  use.   Marland Kitchen Hydrocodone-Acetaminophen Itching and Rash    Past Surgical History:  Procedure Laterality Date  . BOWEL RESECTION  01/17/2018   Procedure: SMALL BOWEL RESECTION;  Surgeon: Jules Husbands, MD;  Location: [facility];  Service: General;;  . BREAST SURGERY Bilateral 1991   mastectomy  . CATARACT EXTRACTION Bilateral   . CHOLECYSTECTOMY    . COLONOSCOPY  2013   Dr Gustavo Lah  . HALLUX VALGUS AUSTIN Right 06/03/2015   Procedure: HALLUX VALGUS AUSTIN;  Surgeon: Samara Deist, DPM;  Location: [facility];  Service: Podiatry;  Laterality: Right;  WITH POPLITEAL  . KNEE ARTHROSCOPY WITH MEDIAL MENISECTOMY Right 08/30/2016   Procedure: KNEE ARTHROSCOPY WITH PARTIAL MEDIAL MENISECTOMY and Partial Lateral Menisectomy;  Surgeon: Hessie Knows, MD;  Location: [facility];  Service: Orthopedics;  Laterality: Right;  . KNEE ARTHROSCOPY WITH SUBCHONDROPLASTY Right 08/30/2016   Procedure: KNEE ARTHROSCOPY WITH SUBCHONDROPLASTY;  Surgeon: Hessie Knows, MD;  Location: [facility];  Service: Orthopedics;  Laterality: Right;  . LAPAROTOMY N/A 01/17/2018   Procedure: EXPLORATORY LAPAROTOMY;  Surgeon: Jules Husbands, MD;  Location: [facility];  Service: General;  Laterality: N/A;  . MASTECTOMY Bilateral   . ROTATOR CUFF REPAIR Bilateral   . TONSILLECTOMY    . TOTAL KNEE ARTHROPLASTY Left   . TOTAL KNEE ARTHROPLASTY Right 05/30/2017   Procedure: TOTAL KNEE ARTHROPLASTY;  Surgeon: Hessie Knows, MD;  Location: [facility];  Service: Orthopedics;  Laterality: Right;  Marland Kitchen VAGINAL HYSTERECTOMY      Social History   Tobacco Use  . Smoking status: Former Smoker    Packs/day: 0.25    Years: 10.00    Pack years: 2.50    Types: Cigarettes    Quit date: 1991    Years since quitting: 29.9  . Smokeless tobacco: Never Used  . Tobacco  comment: smoking cessation materials not required  Substance Use Topics  . Alcohol use: No    Alcohol/week: 0.0 standard drinks  . Drug use: No     Medication list has been  reviewed and updated.  Current Meds  Medication Sig  . acetaminophen (TYLENOL) 325 MG tablet Take 1-2 tablets (325-650 mg total) by mouth every 6 (six) hours as needed for mild pain (pain score 1-3 or temp > 100.5).  Marland Kitchen amLODipine (NORVASC) 5 MG tablet Take 5 mg by mouth daily.  . busPIRone (BUSPAR) 7.5 MG tablet Take 1 tablet (7.5 mg total) by mouth 2 (two) times daily. (Patient taking differently: Take 7.5 mg by mouth 2 (two) times daily as needed. )  . hydrochlorothiazide (HYDRODIURIL) 12.5 MG tablet Take 1 tablet by mouth once daily  . omeprazole (PRILOSEC) 20 MG capsule Take 1 capsule (20 mg total) by mouth daily.    PHQ 2/9 Scores 03/01/2019 09/25/2018 02/02/2018 08/15/2017  PHQ - 2 Score 1 0 0 0  PHQ- 9 Score 4 0 0 1    BP Readings from Last 3 Encounters:  03/01/19 120/64  09/25/18 138/80  08/24/18 132/83    Physical Exam Vitals and nursing note reviewed.  Constitutional:      Appearance: She is well-developed.  HENT:     Head: Normocephalic.     Right Ear: Tympanic membrane, ear canal and external ear normal.     Left Ear: Tympanic membrane, ear canal and external ear normal.     Nose: Nose normal.     Mouth/Throat:     Mouth: Mucous membranes are moist.  Eyes:     General: Lids are everted, no foreign bodies appreciated. No scleral icterus.       Left eye: No foreign body or hordeolum.     Conjunctiva/sclera: Conjunctivae normal.     Right eye: Right conjunctiva is not injected.     Left eye: Left conjunctiva is not injected.     Pupils: Pupils are equal, round, and reactive to light.  Neck:     Thyroid: No thyromegaly.     Vascular: No JVD.     Trachea: No tracheal deviation.  Cardiovascular:     Rate and Rhythm: Normal rate and regular rhythm.     Heart sounds: Normal heart sounds. No murmur. No friction rub. No gallop.   Pulmonary:     Effort: Pulmonary effort is normal. No respiratory distress.     Breath sounds: Normal breath sounds. No wheezing or rales.    Abdominal:     General: Bowel sounds are normal.     Palpations: Abdomen is soft. There is no mass.     Tenderness: There is no abdominal tenderness. There is no guarding or rebound.  Musculoskeletal:        General: No tenderness. Normal range of motion.     Cervical back: Normal range of motion and neck supple.  Lymphadenopathy:     Cervical: No cervical adenopathy.  Skin:    General: Skin is warm.     Findings: No rash.  Neurological:     Mental Status: She is alert and oriented to person, place, and time.     Cranial Nerves: No cranial nerve deficit.     Deep Tendon Reflexes: Reflexes normal.  Psychiatric:        Mood and Affect: Mood is not anxious or depressed.     Wt Readings from Last 3 Encounters:  03/01/19 198 lb (89.8 kg)  09/25/18 198  lb (89.8 kg)  08/24/18 198 lb 4.8 oz (89.9 kg)    BP 120/64   Pulse 64   Ht 5\' 5"  (1.651 m)   Wt 198 lb (89.8 kg)   BMI 32.95 kg/m   Assessment and Plan:  1. Anxiety and depression PHQ score is 4 and gad score is 0 we will continue buspirone 7.5 mg 1 twice a day.  Patient's depression seems to be under control but patient and I discussed the possibility ongoing on SSRI if this should become a concern. - busPIRone (BUSPAR) 7.5 MG tablet; Take 1 tablet (7.5 mg total) by mouth 2 (two) times daily.  Dispense: 180 tablet; Refill: 1  2. Gastroduodenal ulcer Patient with history of duodenal ulcer.  It is currently not being controlled with omeprazole 20 mg.  We will switch to pantoprazole 40 mg once a day. - pantoprazole (PROTONIX) 40 MG tablet; Take 1 tablet (40 mg total) by mouth daily.  Dispense: 90 tablet; Refill: 1  3. Hypothyroidism due to acquired atrophy of thyroid Patient has a history of hypothyroid but has been unable to tolerate levothyroxine in the past.  We will check a TSH to see if there is been any further decrease in function and if so will we discussed the possibilities. - TSH  4. Essential (primary)  hypertension Chronic.  Controlled.  Stable.  Continue hydrochlorothiazide 12.5 mg once a day and amlodipine 5 mg once a day will check renal function panel. - hydrochlorothiazide (HYDRODIURIL) 12.5 MG tablet; Take 1 tablet (12.5 mg total) by mouth daily.  Dispense: 90 tablet; Refill: 1 - amLODipine (NORVASC) 5 MG tablet; Take 1 tablet (5 mg total) by mouth daily.  Dispense: 90 tablet; Refill: 1 - Renal Function Panel  5. Iron deficiency anemia, unspecified iron deficiency anemia type Patient has a history of iron deficiency anemia this is currently thought to be stable we will check a hemoglobin with a CBC to determine if if there is a stabilization of hemoglobin/hematocrit. - CBC with Differential/Platelet

## 2019-03-02 LAB — CBC WITH DIFFERENTIAL/PLATELET
Basophils Absolute: 0 10*3/uL (ref 0.0–0.2)
Basos: 0 %
EOS (ABSOLUTE): 0.1 10*3/uL (ref 0.0–0.4)
Eos: 1 %
Hematocrit: 39 % (ref 34.0–46.6)
Hemoglobin: 13 g/dL (ref 11.1–15.9)
Immature Grans (Abs): 0 10*3/uL (ref 0.0–0.1)
Immature Granulocytes: 0 %
Lymphocytes Absolute: 1.6 10*3/uL (ref 0.7–3.1)
Lymphs: 28 %
MCH: 28.9 pg (ref 26.6–33.0)
MCHC: 33.3 g/dL (ref 31.5–35.7)
MCV: 87 fL (ref 79–97)
Monocytes Absolute: 0.4 10*3/uL (ref 0.1–0.9)
Monocytes: 8 %
Neutrophils Absolute: 3.5 10*3/uL (ref 1.4–7.0)
Neutrophils: 63 %
Platelets: 162 10*3/uL (ref 150–450)
RBC: 4.5 x10E6/uL (ref 3.77–5.28)
RDW: 14.6 % (ref 11.7–15.4)
WBC: 5.6 10*3/uL (ref 3.4–10.8)

## 2019-03-02 LAB — RENAL FUNCTION PANEL
Albumin: 4.5 g/dL (ref 3.6–4.6)
BUN/Creatinine Ratio: 9 — ABNORMAL LOW (ref 12–28)
BUN: 9 mg/dL (ref 8–27)
CO2: 24 mmol/L (ref 20–29)
Calcium: 9.9 mg/dL (ref 8.7–10.3)
Chloride: 97 mmol/L (ref 96–106)
Creatinine, Ser: 0.96 mg/dL (ref 0.57–1.00)
GFR calc Af Amer: 63 mL/min/{1.73_m2} (ref >59)
GFR calc non Af Amer: 55 mL/min/{1.73_m2} — ABNORMAL LOW (ref >59)
Glucose: 160 mg/dL — ABNORMAL HIGH (ref 65–99)
Phosphorus: 3.4 mg/dL (ref 3.0–4.3)
Potassium: 3.4 mmol/L — ABNORMAL LOW (ref 3.5–5.2)
Sodium: 138 mmol/L (ref 134–144)

## 2019-03-02 LAB — TSH: TSH: 3.82 u[IU]/mL (ref 0.450–4.500)

## 2019-03-04 ENCOUNTER — Other Ambulatory Visit: Payer: Self-pay

## 2019-03-04 DIAGNOSIS — K219 Gastro-esophageal reflux disease without esophagitis: Secondary | ICD-10-CM

## 2019-03-04 MED ORDER — OMEPRAZOLE 20 MG PO CPDR: 20.0000 mg | DELAYED_RELEASE_CAPSULE | Freq: Every day | ORAL | 3 refills | Status: DC

## 2019-03-04 NOTE — Progress Notes (Unsigned)
Sent in omeprazole in place of pantoprazole

## 2019-03-06 ENCOUNTER — Emergency Department
Admission: EM | Admit: 2019-03-06 | Discharge: 2019-03-06 | Disposition: A | Discharge status: Home / Self Care | Payer: Medicare HMO | Attending: Emergency Medicine | Admitting: Emergency Medicine

## 2019-03-06 ENCOUNTER — Ambulatory Visit (INDEPENDENT_AMBULATORY_CARE_PROVIDER_SITE_OTHER)
Admission: EM | Admit: 2019-03-06 | Discharge: 2019-03-06 | Disposition: A | Discharge status: Other Acute Inpatient Hospital | Payer: Medicare HMO | Source: Home / Self Care | Attending: Family Medicine | Admitting: Family Medicine

## 2019-03-06 ENCOUNTER — Emergency Department: Payer: Medicare HMO

## 2019-03-06 ENCOUNTER — Encounter: Payer: Self-pay | Admitting: Emergency Medicine

## 2019-03-06 ENCOUNTER — Other Ambulatory Visit: Payer: Self-pay

## 2019-03-06 DIAGNOSIS — Z853 Personal history of malignant neoplasm of breast: Secondary | ICD-10-CM | POA: Diagnosis not present

## 2019-03-06 DIAGNOSIS — E039 Hypothyroidism, unspecified: Secondary | ICD-10-CM | POA: Diagnosis not present

## 2019-03-06 DIAGNOSIS — U071 COVID-19: Secondary | ICD-10-CM

## 2019-03-06 DIAGNOSIS — K5730 Diverticulosis of large intestine without perforation or abscess without bleeding: Secondary | ICD-10-CM | POA: Diagnosis not present

## 2019-03-06 DIAGNOSIS — R197 Diarrhea, unspecified: Secondary | ICD-10-CM | POA: Diagnosis not present

## 2019-03-06 DIAGNOSIS — Z87891 Personal history of nicotine dependence: Secondary | ICD-10-CM | POA: Diagnosis not present

## 2019-03-06 DIAGNOSIS — Z96653 Presence of artificial knee joint, bilateral: Secondary | ICD-10-CM | POA: Diagnosis not present

## 2019-03-06 DIAGNOSIS — Z9013 Acquired absence of bilateral breasts and nipples: Secondary | ICD-10-CM | POA: Insufficient documentation

## 2019-03-06 DIAGNOSIS — E860 Dehydration: Secondary | ICD-10-CM | POA: Insufficient documentation

## 2019-03-06 DIAGNOSIS — I10 Essential (primary) hypertension: Secondary | ICD-10-CM | POA: Diagnosis not present

## 2019-03-06 DIAGNOSIS — Z79899 Other long term (current) drug therapy: Secondary | ICD-10-CM | POA: Diagnosis not present

## 2019-03-06 DIAGNOSIS — E876 Hypokalemia: Secondary | ICD-10-CM | POA: Diagnosis not present

## 2019-03-06 DIAGNOSIS — I716 Thoracoabdominal aortic aneurysm, without rupture: Secondary | ICD-10-CM | POA: Diagnosis not present

## 2019-03-06 DIAGNOSIS — A09 Infectious gastroenteritis and colitis, unspecified: Secondary | ICD-10-CM

## 2019-03-06 DIAGNOSIS — R110 Nausea: Secondary | ICD-10-CM | POA: Diagnosis not present

## 2019-03-06 DIAGNOSIS — R52 Pain, unspecified: Secondary | ICD-10-CM | POA: Diagnosis present

## 2019-03-06 LAB — CBC WITH DIFFERENTIAL/PLATELET
Abs Immature Granulocytes: 0.01 10*3/uL (ref 0.00–0.07)
Basophils Absolute: 0 10*3/uL (ref 0.0–0.1)
Basophils Relative: 0 %
Eosinophils Absolute: 0 10*3/uL (ref 0.0–0.5)
Eosinophils Relative: 0 %
HCT: 38.2 % (ref 36.0–46.0)
Hemoglobin: 12.9 g/dL (ref 12.0–15.0)
Immature Granulocytes: 0 %
Lymphocytes Relative: 26 %
Lymphs Abs: 1.2 10*3/uL (ref 0.7–4.0)
MCH: 28.5 pg (ref 26.0–34.0)
MCHC: 33.8 g/dL (ref 30.0–36.0)
MCV: 84.3 fL (ref 80.0–100.0)
Monocytes Absolute: 0.3 10*3/uL (ref 0.1–1.0)
Monocytes Relative: 7 %
Neutro Abs: 3.1 10*3/uL (ref 1.7–7.7)
Neutrophils Relative %: 67 %
Platelets: 112 10*3/uL — ABNORMAL LOW (ref 150–400)
RBC: 4.53 MIL/uL (ref 3.87–5.11)
RDW: 15.3 % (ref 11.5–15.5)
WBC: 4.7 10*3/uL (ref 4.0–10.5)
nRBC: 0 % (ref 0.0–0.2)

## 2019-03-06 LAB — LIPASE, BLOOD
Lipase: 33 U/L (ref 11–51)
Lipase: 44 U/L (ref 11–51)

## 2019-03-06 LAB — COMPREHENSIVE METABOLIC PANEL
ALT: 43 U/L (ref 0–44)
ALT: 44 U/L (ref 0–44)
AST: 63 U/L — ABNORMAL HIGH (ref 15–41)
AST: 62 U/L — ABNORMAL HIGH (ref 15–41)
Albumin: 3.9 g/dL (ref 3.5–5.0)
Albumin: 4 g/dL (ref 3.5–5.0)
Alkaline Phosphatase: 61 U/L (ref 38–126)
Alkaline Phosphatase: 62 U/L (ref 38–126)
Anion gap: 10 (ref 5–15)
Anion gap: 16 — ABNORMAL HIGH (ref 5–15)
BUN: 14 mg/dL (ref 8–23)
BUN: 15 mg/dL (ref 8–23)
CO2: 22 mmol/L (ref 22–32)
CO2: 21 mmol/L — ABNORMAL LOW (ref 22–32)
Calcium: 9.2 mg/dL (ref 8.9–10.3)
Calcium: 9.5 mg/dL (ref 8.9–10.3)
Chloride: 102 mmol/L (ref 98–111)
Chloride: 100 mmol/L (ref 98–111)
Creatinine, Ser: 1 mg/dL (ref 0.44–1.00)
Creatinine, Ser: 0.76 mg/dL (ref 0.44–1.00)
GFR calc Af Amer: >60 mL/min (ref >60)
GFR calc Af Amer: >60 mL/min (ref >60)
GFR calc non Af Amer: 52 mL/min — ABNORMAL LOW (ref >60)
GFR calc non Af Amer: >60 mL/min (ref >60)
Glucose, Bld: 104 mg/dL — ABNORMAL HIGH (ref 70–99)
Glucose, Bld: 109 mg/dL — ABNORMAL HIGH (ref 70–99)
Potassium: 2.6 mmol/L — CL (ref 3.5–5.1)
Potassium: 3 mmol/L — ABNORMAL LOW (ref 3.5–5.1)
Sodium: 134 mmol/L — ABNORMAL LOW (ref 135–145)
Sodium: 137 mmol/L (ref 135–145)
Total Bilirubin: 0.6 mg/dL (ref 0.3–1.2)
Total Bilirubin: 0.6 mg/dL (ref 0.3–1.2)
Total Protein: 7.7 g/dL (ref 6.5–8.1)
Total Protein: 7.7 g/dL (ref 6.5–8.1)

## 2019-03-06 LAB — CBC
HCT: 37.9 % (ref 36.0–46.0)
Hemoglobin: 12.9 g/dL (ref 12.0–15.0)
MCH: 27.6 pg (ref 26.0–34.0)
MCHC: 34 g/dL (ref 30.0–36.0)
MCV: 81.2 fL (ref 80.0–100.0)
Platelets: 129 10*3/uL — ABNORMAL LOW (ref 150–400)
RBC: 4.67 MIL/uL (ref 3.87–5.11)
RDW: 15.4 % (ref 11.5–15.5)
WBC: 5 10*3/uL (ref 4.0–10.5)
nRBC: 0 % (ref 0.0–0.2)

## 2019-03-06 LAB — URINALYSIS, COMPLETE (UACMP) WITH MICROSCOPIC
Bilirubin Urine: NEGATIVE
Glucose, UA: NEGATIVE mg/dL
Hgb urine dipstick: NEGATIVE
Ketones, ur: 5 mg/dL — AB
Nitrite: NEGATIVE
Protein, ur: 100 mg/dL — AB
Specific Gravity, Urine: 1.027 (ref 1.005–1.030)
pH: 6 (ref 5.0–8.0)

## 2019-03-06 LAB — MAGNESIUM: Magnesium: 1.8 mg/dL (ref 1.7–2.4)

## 2019-03-06 LAB — LACTIC ACID, PLASMA: Lactic Acid, Venous: 1.7 mmol/L (ref 0.5–1.9)

## 2019-03-06 LAB — SARS CORONAVIRUS 2 AG (30 MIN TAT): SARS Coronavirus 2 Ag: POSITIVE — AB

## 2019-03-06 MED ORDER — IOHEXOL 300 MG/ML  SOLN
100.0000 mL | Freq: Once | INTRAMUSCULAR | Status: AC | PRN
  Administered 2019-03-06: 100 mL via INTRAVENOUS

## 2019-03-06 MED ORDER — ONDANSETRON 4 MG PO TBDP: 4.0000 mg | ORAL_TABLET | Freq: Three times a day (TID) | ORAL | 0 refills | Status: DC | PRN

## 2019-03-06 MED ORDER — SODIUM CHLORIDE 0.9 % IV BOLUS
1000.0000 mL | Freq: Once | INTRAVENOUS | Status: AC
  Administered 2019-03-06: 1000 mL via INTRAVENOUS

## 2019-03-06 MED ORDER — POTASSIUM CHLORIDE ER 10 MEQ PO TBCR: 10.0000 meq | EXTENDED_RELEASE_TABLET | Freq: Two times a day (BID) | ORAL | 0 refills | Status: DC

## 2019-03-06 MED ORDER — ONDANSETRON 4 MG PO TBDP
4.0000 mg | ORAL_TABLET | Freq: Once | ORAL | Status: AC
  Administered 2019-03-06: 4 mg via ORAL

## 2019-03-06 MED ORDER — POTASSIUM CHLORIDE CRYS ER 20 MEQ PO TBCR
40.0000 meq | EXTENDED_RELEASE_TABLET | Freq: Once | ORAL | Status: AC
  Administered 2019-03-06: 40 meq via ORAL
  Filled 2019-03-06: qty 2

## 2019-03-06 NOTE — Discharge Instructions (Signed)
Your Covid test from earlier today was positive, and the Coronavirus infection is causing your diarrhea and dehydration. Your CT scan was okay, and your other lab tests were reassuring. You should continue to take a potassium supplement until your diarrhea resolves. Return to the ER if you have high fever, vomiting and inability to drink fluids, shortness of breath, or other new concerns.

## 2019-03-06 NOTE — Discharge Instructions (Signed)
Go to the ER.  You need IV potassium.  Take care  Dr. Lacinda Axon

## 2019-03-06 NOTE — ED Notes (Signed)
E-signature not working at this time. Pt verbalized understanding of D/C instructions, prescriptions and follow up care with no further questions at this time. Pt in NAD and wheeled to lobby at time of D/C.  

## 2019-03-06 NOTE — ED Provider Notes (Signed)
[facility] Emergency Department Provider Note  ____________________________________________  Time seen: Approximately 6:47 PM  I have reviewed the triage vital signs and the nursing notes.   HISTORY  Chief Complaint Diarrhea and Abnormal Lab    HPI Kaitlyn Oliver is a 85 y.o. female with a history of breast cancer, GERD, hypertension who was sent to the ED today due to low potassium.  She first went to urgent care to be evaluated for multiple symptoms including watery diarrhea, nausea, diffuse body aches, malaise, decreased appetite for the past week.  Gradual onset, constant, waxing waning, no aggravating or alleviating factors.  Denies focal chest pain shortness of breath abdominal pain back pain headache vision changes paresthesias weakness dizziness or syncope.  Reviewed electronic medical records.  Labs urgent care showed a potassium of 2.6.  A Covid test was done and was positive today.      Past Medical History:  Diagnosis Date  . Anxiety   . Arthritis    thumbs, hands  . Cancer [facility]) 1991   bilateral breast  . Depression   . GERD (gastroesophageal reflux disease)   . Headache    occasional - pinched nerve in neck  . Hypertension   . Hypothyroidism   . Thyroid disease   . Wears dentures    full upper, partial lower     Patient Active Problem List   Diagnosis Date Noted  . Hypokalemia 08/20/2018  . 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 Goals of care, counseling/discussion 01/26/2018  . Small bowel tumor   . Pancreatic cyst 01/15/2018  . Anxiety and depression 01/15/2018  . Pancreas cyst 01/15/2018  . Bowel obstruction ([place]) 11/29/2017  . Small bowel obstruction ([place])   . Thoracoabdominal aortic aneurysm (TAAA) ([place]) 11/19/2017  . Status post total knee replacement using cement, right 05/30/2017  . GERD (gastroesophageal reflux disease) 12/22/2015  . Adenomatous polyp 09/11/2014  . Bilateral cataracts  09/11/2014  . Gastric catarrh 09/11/2014  . Gastroduodenal ulcer 09/11/2014  . Hypothyroidism 09/11/2014  . Cardiac murmur 10/08/2013  . Essential (primary) hypertension 10/08/2013  . Combined fat and carbohydrate induced hyperlipemia 10/08/2013     Past Surgical History:  Procedure Laterality Date  . BOWEL RESECTION  01/17/2018   Procedure: SMALL BOWEL RESECTION;  Surgeon: Jules Husbands, MD;  Location: [facility] ORS;  Service: General;;  . BREAST SURGERY Bilateral 1991   mastectomy  . CATARACT EXTRACTION Bilateral   . CHOLECYSTECTOMY    . COLONOSCOPY  2013   Dr Gustavo Lah  . HALLUX VALGUS AUSTIN Right 06/03/2015   Procedure: HALLUX VALGUS AUSTIN;  Surgeon: Samara Deist, DPM;  Location: [place];  Service: Podiatry;  Laterality: Right;  WITH POPLITEAL  . KNEE ARTHROSCOPY WITH MEDIAL MENISECTOMY Right 08/30/2016   Procedure: KNEE ARTHROSCOPY WITH PARTIAL MEDIAL MENISECTOMY and Partial Lateral Menisectomy;  Surgeon: Hessie Knows, MD;  Location: [facility];  Service: Orthopedics;  Laterality: Right;  . KNEE ARTHROSCOPY WITH SUBCHONDROPLASTY Right 08/30/2016   Procedure: KNEE ARTHROSCOPY WITH SUBCHONDROPLASTY;  Surgeon: Hessie Knows, MD;  Location: [facility];  Service: Orthopedics;  Laterality: Right;  . LAPAROTOMY N/A 01/17/2018   Procedure: EXPLORATORY LAPAROTOMY;  Surgeon: Jules Husbands, MD;  Location: [facility];  Service: General;  Laterality: N/A;  . MASTECTOMY Bilateral   . ROTATOR CUFF REPAIR Bilateral   . TONSILLECTOMY    . TOTAL KNEE ARTHROPLASTY Left   . TOTAL KNEE ARTHROPLASTY Right 05/30/2017   Procedure: TOTAL KNEE ARTHROPLASTY;  Surgeon: Hessie Knows,  MD;  Location: [facility];  Service: Orthopedics;  Laterality: Right;  Marland Kitchen VAGINAL HYSTERECTOMY       Prior to Admission medications   Medication Sig Start Date End Date Taking? Authorizing Provider  acetaminophen (TYLENOL) 325 MG tablet Take 1-2 tablets (325-650 mg total) by mouth every 6 (six) hours as needed for mild  pain (pain score 1-3 or temp > 100.5). 06/01/17   Duanne Guess, PA-C  amLODipine (NORVASC) 5 MG tablet Take 1 tablet (5 mg total) by mouth daily. 03/01/19   Juline Patch, MD  busPIRone (BUSPAR) 7.5 MG tablet Take 1 tablet (7.5 mg total) by mouth 2 (two) times daily. 03/01/19   Juline Patch, MD  EUTHYROX 50 MCG tablet Take 1 tablet by mouth once daily 01/31/19   Juline Patch, MD  hydrochlorothiazide (HYDRODIURIL) 12.5 MG tablet Take 1 tablet (12.5 mg total) by mouth daily. 03/01/19   Juline Patch, MD  omeprazole (PRILOSEC) 20 MG capsule Take 1 capsule (20 mg total) by mouth daily. 03/04/19   Juline Patch, MD  ondansetron (ZOFRAN ODT) 4 MG disintegrating tablet Take 1 tablet (4 mg total) by mouth every 8 (eight) hours as needed for nausea or vomiting. 03/06/19   Carrie Mew, MD  potassium chloride (KLOR-CON) 10 MEQ tablet Take 1 tablet (10 mEq total) by mouth 2 (two) times daily for 10 days. 03/06/19 03/16/19  Carrie Mew, MD     Allergies Adhesive [tape], Penicillin g, and Hydrocodone-acetaminophen   Family History  Problem Relation Age of Onset  . Pulmonary embolism Mother   . Healthy Father     Social History Social History   Tobacco Use  . Smoking status: Former Smoker    Packs/day: 0.25    Years: 10.00    Pack years: 2.50    Types: Cigarettes    Quit date: 1991    Years since quitting: 29.9  . Smokeless tobacco: Never Used  . Tobacco comment: smoking cessation materials not required  Substance Use Topics  . Alcohol use: No    Alcohol/week: 0.0 standard drinks  . Drug use: No    Review of Systems  Constitutional:   No fever or chills.  ENT:   No sore throat. No rhinorrhea. Cardiovascular:   No chest pain or syncope. Respiratory:   No dyspnea or cough. Gastrointestinal:   Negative for abdominal pain, positive diarrhea Musculoskeletal:   Negative for focal pain or swelling.  Positive diffuse body aches All other systems reviewed and are  negative except as documented above in ROS and HPI.  ____________________________________________   PHYSICAL EXAM:  VITAL SIGNS: ED Triage Vitals  Enc Vitals Group     BP 03/06/19 1622 (!) 165/100     Pulse Rate 03/06/19 1622 71     Resp 03/06/19 1622 16     Temp 03/06/19 1622 99.6 F (37.6 C)     Temp Source 03/06/19 1622 Oral     SpO2 03/06/19 1622 94 %     Weight 03/06/19 1623 180 lb (81.6 kg)     Height 03/06/19 1623 5\' 5"  (1.651 m)     Head Circumference --      Peak Flow --      Pain Score 03/06/19 1623 10     Pain Loc --      Pain Edu? --      Excl. in [place]? --     Vital signs reviewed, nursing assessments reviewed.   Constitutional:   Alert and oriented.  Non-toxic appearance. Eyes:   Conjunctivae are normal. EOMI. PERRL. ENT      Head:   Normocephalic and atraumatic.      Nose:   Wearing a mask.      Mouth/Throat:   Wearing a mask.      Neck:   No meningismus. Full ROM. Hematological/Lymphatic/Immunilogical:   No cervical lymphadenopathy. Cardiovascular:   RRR. Symmetric bilateral radial and DP pulses.  No murmurs. Cap refill less than 2 seconds. Respiratory:   Normal respiratory effort without tachypnea/retractions. Breath sounds are clear and equal bilaterally. No wheezes/rales/rhonchi. Gastrointestinal:   Soft with diffuse left-sided tenderness. Non distended. There is no CVA tenderness.  No rebound, rigidity, or guarding. Musculoskeletal:   Normal range of motion in all extremities. No joint effusions.  No lower extremity tenderness.  No edema. Neurologic:   Normal speech and language.  Motor grossly intact. No acute focal neurologic deficits are appreciated.  Skin:    Skin is warm, dry and intact. No rash noted.  No petechiae, purpura, or bullae.  ____________________________________________    LABS (pertinent positives/negatives) (all labs ordered are listed, but only abnormal results are displayed) Labs Reviewed  COMPREHENSIVE METABOLIC PANEL -  Abnormal; Notable for the following components:      Result Value   Potassium 3.0 (*)    CO2 21 (*)    Glucose, Bld 109 (*)    AST 62 (*)    Anion gap 16 (*)    All other components within normal limits  CBC - Abnormal; Notable for the following components:   Platelets 129 (*)    All other components within normal limits  URINALYSIS, COMPLETE (UACMP) WITH MICROSCOPIC - Abnormal; Notable for the following components:   Color, Urine YELLOW (*)    APPearance CLOUDY (*)    Ketones, ur 5 (*)    Protein, ur 100 (*)    Leukocytes,Ua TRACE (*)    Bacteria, UA RARE (*)    All other components within normal limits  LIPASE, BLOOD  MAGNESIUM  LACTIC ACID, PLASMA   ____________________________________________   EKG    ____________________________________________    RADIOLOGY  CT abd  Result Date: 03/06/2019 CLINICAL DATA:  85 year old female presenting with diarrhea and nausea. Positive COVID-19. history of neuroendocrine tumor of the small bowel. EXAM: CT ABDOMEN AND PELVIS WITH CONTRAST TECHNIQUE: Multidetector CT imaging of the abdomen and pelvis was performed using the standard protocol following bolus administration of intravenous contrast. CONTRAST:  178mL OMNIPAQUE IOHEXOL 300 MG/ML  SOLN COMPARISON:  CT abdomen pelvis dated 01/15/2018. FINDINGS: Lower chest: Partially visualized peripheral and subpleural hazy and ground-glass densities most consistent with multifocal pneumonia, likely atypical or viral in etiology including COVID-19. There is coronary vascular calcification and calcification of the mitral annulus. No intra-abdominal free air or free fluid. Hepatobiliary: Cirrhosis with fatty infiltration of the liver. Several small calcified liver granuloma noted. No intrahepatic biliary ductal dilatation. Cholecystectomy. Pancreas: Similar appearance of hypodense lesions in the head and neck of the pancreas as the CT of 2019. Follow-up as per recommendation of the prior MRI of  01/16/2018. No peripancreatic inflammatory changes. No dilatation of the main pancreatic duct or gland atrophy. Spleen: Normal in size without focal abnormality. Adrenals/Urinary Tract: The adrenal glands are unremarkable. Areas of scarring in the inferior aspect of the right kidney. Several bilateral renal hypodense lesions are not well characterized but appears similar to prior CT. Several small bilateral nonobstructing renal calculi measure up to 3-4 mm. There is no hydronephrosis on either  side. There is symmetric enhancement and excretion of contrast by both kidneys. The visualized ureters appear unremarkable. The urinary bladder is collapsed. Stomach/Bowel: There is sigmoid diverticulosis without active inflammatory changes. There is postsurgical changes of distal small bowel resection with anastomotic suture. There is a small hiatal hernia. There is no bowel obstruction or active inflammation. The appendix is normal as visualized. Vascular/Lymphatic: Advanced aortoiliac atherosclerotic disease. The IVC is unremarkable. No portal venous gas. There is no adenopathy. Reproductive: Hysterectomy. A 1 cm hypodense lesion in the left ovary is not characterized but may represent a cyst. Other: Midline vertical anterior abdominal wall incisional scar. Musculoskeletal: Osteopenia with degenerative changes of the spine. No acute osseous pathology. Grade 1 L4-L5 anterolisthesis. IMPRESSION: 1. No acute intra-abdominal or pelvic pathology. No bowel obstruction or active inflammation. 2. Sigmoid diverticulosis. 3. Cirrhosis. 4. Stable hypodense lesions in the head and neck of the pancreas as seen on the prior CT. Follow-up as per recommendation of the prior MRI of 01/16/2018. 5. Aortic Atherosclerosis (ICD10-I70.0). Electronically Signed   By: Anner Crete M.D.   On: 03/06/2019 18:56     ____________________________________________   PROCEDURES Procedures  ____________________________________________  DIFFERENTIAL DIAGNOSIS   COVID-19 infection, viral syndrome, dehydration, hypokalemia, diverticulitis  CLINICAL IMPRESSION / ASSESSMENT AND PLAN / ED COURSE  Medications ordered in the ED: Medications  potassium chloride SA (KLOR-CON) CR tablet 40 mEq (40 mEq Oral Given 03/06/19 1638)  sodium chloride 0.9 % bolus 1,000 mL (1,000 mLs Intravenous New Bag/Given 03/06/19 1841)  iohexol (OMNIPAQUE) 300 MG/ML solution 100 mL (100 mLs Intravenous Contrast Given 03/06/19 1828)    Pertinent labs & imaging results that were available during my care of the patient were reviewed by me and considered in my medical decision making (see chart for details).  Kaitlyn Oliver was evaluated in Emergency Department on 03/06/2019 for the symptoms described in the history of present illness. She was evaluated in the context of the global COVID-19 pandemic, which necessitated consideration that the patient might be at risk for infection with the SARS-CoV-2 virus that causes COVID-19. Institutional protocols and algorithms that pertain to the evaluation of patients at risk for COVID-19 are in a state of rapid change based on information released by regulatory bodies including the CDC and federal and state organizations. These policies and algorithms were followed during the patient's care in the ED.     Clinical Course as of Mar 05 1934  Wed Mar 06, 2019  1814 Patient presents with body aches, malaise and fatigue, loss of appetite, diarrhea.  Influenza-like illness concerning for COVID-19.  She already had 2 Covid tests done today at urgent care so no need to repeat.  She is not hypoxic or in respiratory distress.  She does have abdominal tenderness and clinically dehydrated, so obtain a CT scan of the abdomen and pelvis and give IV fluids.  Check a lactate due to her elevated anion gap.    [PS]  1902 Patient informed of Covid result.  She is comfortable taking oral potassium.  Her level here in the ED is 3.0 and she already took 40 mEq of oral potassium.  I will continue her on her supplement, prescription for Zofran which has worked very well to control her symptoms, plan for outpatient follow-up.  Return precautions discussed with the patient.   [PS]    Clinical Course User Index [PS] Carrie Mew, MD    ----------------------------------------- 7:36 PM on 03/06/2019 -----------------------------------------  CT scan unremarkable, stable for discharge.  Tolerating  oral intake.   ____________________________________________   FINAL CLINICAL IMPRESSION(S) / ED DIAGNOSES    Final diagnoses:  [id number] virus infection  Dehydration  Diarrhea of infectious origin     ED Discharge Orders         Ordered    ondansetron (ZOFRAN ODT) 4 MG disintegrating tablet  Every 8 hours PRN     03/06/19 1935    potassium chloride (KLOR-CON) 10 MEQ tablet  2 times daily     03/06/19 1935          Portions of this note were generated with dragon dictation software. Dictation errors may occur despite best attempts at proofreading.   Carrie Mew, MD 03/06/19 [phone]

## 2019-03-06 NOTE — ED Triage Notes (Signed)
Patient presents to the ED with diarrhea x 1 week, nausea and body aches.  Patient states, "I haven't had solid foods in a long time."  Patient states she lost her sense of taste and smell years ago.  Patient is alert and oriented x 4 at this time.  No obvious distress at this time.

## 2019-03-06 NOTE — ED Provider Notes (Addendum)
[facility]    CSN: [id number] Arrival date & time: 03/06/19  1308  History   Chief Complaint Chief Complaint  Patient presents with  . Generalized Body Aches   HPI  85 year old female presents with generalized body aches, weakness, nausea, diarrhea.  Patient reports that she did not feel well on Friday.  Worsening symptoms on Saturday.  She has continued to not feel well since that time.  She reports diffuse body aches, weakness, nausea, and diarrhea.  She denies any vomiting but states that she feels like she could vomit.  She has had little to no p.o. intake.  She is able to tolerate water but nothing else.  She reports an approximate 10 pound weight loss since she has not been feeling well.  No documented fever.  No reported Covid exposure.  She has a history of carcinoid.  Last PET scan was clean.  No known relieving factors.  She states that her pain is severe.  No other reported symptoms.  No other complaints.  PMH, Surgical Hx, Family Hx, Social History reviewed and updated as below.  Past Medical History:  Diagnosis Date  . Anxiety   . Arthritis    thumbs, hands  . Cancer [facility]) 1991   bilateral breast  . Depression   . GERD (gastroesophageal reflux disease)   . Headache    occasional - pinched nerve in neck  . Hypertension   . Hypothyroidism   . Thyroid disease   . Wears dentures    full upper, partial lower   Patient Active Problem List   Diagnosis Date Noted  . Hypokalemia 08/20/2018  . 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 Goals of care, counseling/discussion 01/26/2018  . Small bowel tumor   . Pancreatic cyst 01/15/2018  . Anxiety and depression 01/15/2018  . Pancreas cyst 01/15/2018  . Bowel obstruction ([place]) 11/29/2017  . Small bowel obstruction ([place])   . Thoracoabdominal aortic aneurysm (TAAA) ([place]) 11/19/2017  . Status post total knee replacement using cement, right 05/30/2017  . GERD (gastroesophageal reflux  disease) 12/22/2015  . Adenomatous polyp 09/11/2014  . Bilateral cataracts 09/11/2014  . Gastric catarrh 09/11/2014  . Gastroduodenal ulcer 09/11/2014  . Hypothyroidism 09/11/2014  . Cardiac murmur 10/08/2013  . Essential (primary) hypertension 10/08/2013  . Combined fat and carbohydrate induced hyperlipemia 10/08/2013   Past Surgical History:  Procedure Laterality Date  . BOWEL RESECTION  01/17/2018   Procedure: SMALL BOWEL RESECTION;  Surgeon: Jules Husbands, MD;  Location: [facility];  Service: General;;  . BREAST SURGERY Bilateral 1991   mastectomy  . CATARACT EXTRACTION Bilateral   . CHOLECYSTECTOMY    . COLONOSCOPY  2013   Dr Gustavo Lah  . HALLUX VALGUS AUSTIN Right 06/03/2015   Procedure: HALLUX VALGUS AUSTIN;  Surgeon: Samara Deist, DPM;  Location: [facility];  Service: Podiatry;  Laterality: Right;  WITH POPLITEAL  . KNEE ARTHROSCOPY WITH MEDIAL MENISECTOMY Right 08/30/2016   Procedure: KNEE ARTHROSCOPY WITH PARTIAL MEDIAL MENISECTOMY and Partial Lateral Menisectomy;  Surgeon: Hessie Knows, MD;  Location: [facility];  Service: Orthopedics;  Laterality: Right;  . KNEE ARTHROSCOPY WITH SUBCHONDROPLASTY Right 08/30/2016   Procedure: KNEE ARTHROSCOPY WITH SUBCHONDROPLASTY;  Surgeon: Hessie Knows, MD;  Location: [facility];  Service: Orthopedics;  Laterality: Right;  . LAPAROTOMY N/A 01/17/2018   Procedure: EXPLORATORY LAPAROTOMY;  Surgeon: Jules Husbands, MD;  Location: [facility];  Service: General;  Laterality: N/A;  . MASTECTOMY Bilateral   . ROTATOR CUFF REPAIR Bilateral   .  TONSILLECTOMY    . TOTAL KNEE ARTHROPLASTY Left   . TOTAL KNEE ARTHROPLASTY Right 05/30/2017   Procedure: TOTAL KNEE ARTHROPLASTY;  Surgeon: Hessie Knows, MD;  Location: [facility];  Service: Orthopedics;  Laterality: Right;  Marland Kitchen VAGINAL HYSTERECTOMY     OB History   No obstetric history on file.    Home Medications    Prior to Admission medications   Medication Sig Start Date End Date Taking?  Authorizing Provider  acetaminophen (TYLENOL) 325 MG tablet Take 1-2 tablets (325-650 mg total) by mouth every 6 (six) hours as needed for mild pain (pain score 1-3 or temp > 100.5). 06/01/17  Yes Duanne Guess, PA-C  amLODipine (NORVASC) 5 MG tablet Take 1 tablet (5 mg total) by mouth daily. 03/01/19  Yes Juline Patch, MD  busPIRone (BUSPAR) 7.5 MG tablet Take 1 tablet (7.5 mg total) by mouth 2 (two) times daily. 03/01/19  Yes Juline Patch, MD  EUTHYROX 50 MCG tablet Take 1 tablet by mouth once daily 01/31/19  Yes Juline Patch, MD  hydrochlorothiazide (HYDRODIURIL) 12.5 MG tablet Take 1 tablet (12.5 mg total) by mouth daily. 03/01/19  Yes Juline Patch, MD  omeprazole (PRILOSEC) 20 MG capsule Take 1 capsule (20 mg total) by mouth daily. 03/04/19  Yes Juline Patch, MD  ondansetron (ZOFRAN ODT) 4 MG disintegrating tablet Take 1 tablet (4 mg total) by mouth every 8 (eight) hours as needed for nausea or vomiting. 03/06/19   Carrie Mew, MD  potassium chloride (KLOR-CON) 10 MEQ tablet Take 1 tablet (10 mEq total) by mouth 2 (two) times daily for 10 days. 03/06/19 03/16/19  Carrie Mew, MD    Family History Family History  Problem Relation Age of Onset  . Pulmonary embolism Mother   . Healthy Father     Social History Social History   Tobacco Use  . Smoking status: Former Smoker    Packs/day: 0.25    Years: 10.00    Pack years: 2.50    Types: Cigarettes    Quit date: 1991    Years since quitting: 30.0  . Smokeless tobacco: Never Used  . Tobacco comment: smoking cessation materials not required  Substance Use Topics  . Alcohol use: No    Alcohol/week: 0.0 standard drinks  . Drug use: No     Allergies   Adhesive [tape], Penicillin g, and Hydrocodone-acetaminophen   Review of Systems Review of Systems  Constitutional: Positive for appetite change and unexpected weight change.  Gastrointestinal: Positive for diarrhea and nausea.  Musculoskeletal:        Body aches.  Neurological: Positive for weakness.  All other systems reviewed and are negative.  Physical Exam Triage Vital Signs ED Triage Vitals  Enc Vitals Group     BP 03/06/19 1346 (!) 151/83     Pulse Rate 03/06/19 1346 69     Resp 03/06/19 1346 16     Temp 03/06/19 1346 98.1 F (36.7 C)     Temp Source 03/06/19 1346 Oral     SpO2 03/06/19 1346 97 %     Weight 03/06/19 1345 185 lb (83.9 kg)     Height 03/06/19 1345 5\' 5"  (1.651 m)     Head Circumference --      Peak Flow --      Pain Score 03/06/19 1344 10     Pain Loc --      Pain Edu? --      Excl. in [place]? --  Updated Vital Signs BP (!) 151/83 (BP Location: Left Arm)   Pulse 69   Temp 98.1 F (36.7 C) (Oral)   Resp 16   Ht 5\' 5"  (1.651 m)   Wt 83.9 kg   SpO2 97%   BMI 30.79 kg/m   Visual Acuity Right Eye Distance:   Left Eye Distance:   Bilateral Distance:    Right Eye Near:   Left Eye Near:    Bilateral Near:     Physical Exam Vitals and nursing note reviewed.  Constitutional:      Appearance: She is not diaphoretic.     Comments: Patient appears fatigued and mildly ill.  In no apparent distress.  HENT:     Head: Normocephalic and atraumatic.     Nose: Nose normal.     Mouth/Throat:     Mouth: Mucous membranes are dry.  Eyes:     General: No scleral icterus.       Right eye: No discharge.        Left eye: No discharge.     Conjunctiva/sclera: Conjunctivae normal.  Cardiovascular:     Rate and Rhythm: Normal rate and regular rhythm.  Pulmonary:     Effort: Pulmonary effort is normal.     Breath sounds: Normal breath sounds. No wheezing, rhonchi or rales.  Abdominal:     General: There is no distension.     Palpations: Abdomen is soft.     Tenderness: There is no abdominal tenderness. There is no guarding or rebound.  Skin:    General: Skin is warm.     Findings: No rash.  Neurological:     Mental Status: She is alert.     Comments: No apparent focal deficits.  Psychiatric:         Mood and Affect: Mood normal.        Behavior: Behavior normal.    UC Treatments / Results  Labs (all labs ordered are listed, but only abnormal results are displayed) Labs Reviewed  SARS CORONAVIRUS 2 AG (30 MIN TAT) - Abnormal; Notable for the following components:      Result Value   SARS Coronavirus 2 Ag POSITIVE (*)    All other components within normal limits  CBC WITH DIFFERENTIAL/PLATELET - Abnormal; Notable for the following components:   Platelets 112 (*)    All other components within normal limits  COMPREHENSIVE METABOLIC PANEL - Abnormal; Notable for the following components:   Sodium 134 (*)    Potassium 2.6 (*)    Glucose, Bld 104 (*)    AST 63 (*)    GFR calc non Af Amer 52 (*)    All other components within normal limits  NOVEL CORONAVIRUS, NAA (HOSP ORDER, SEND-OUT TO REF LAB; TAT 18-24 HRS)  LIPASE, BLOOD    EKG   Radiology CT abd  Result Date: 03/06/2019 CLINICAL DATA:  85 year old female presenting with diarrhea and nausea. Positive COVID-19. history of neuroendocrine tumor of the small bowel. EXAM: CT ABDOMEN AND PELVIS WITH CONTRAST TECHNIQUE: Multidetector CT imaging of the abdomen and pelvis was performed using the standard protocol following bolus administration of intravenous contrast. CONTRAST:  133mL OMNIPAQUE IOHEXOL 300 MG/ML  SOLN COMPARISON:  CT abdomen pelvis dated 01/15/2018. FINDINGS: Lower chest: Partially visualized peripheral and subpleural hazy and ground-glass densities most consistent with multifocal pneumonia, likely atypical or viral in etiology including COVID-19. There is coronary vascular calcification and calcification of the mitral annulus. No intra-abdominal free air or free fluid. Hepatobiliary: Cirrhosis  with fatty infiltration of the liver. Several small calcified liver granuloma noted. No intrahepatic biliary ductal dilatation. Cholecystectomy. Pancreas: Similar appearance of hypodense lesions in the head and neck of the pancreas  as the CT of 2019. Follow-up as per recommendation of the prior MRI of 01/16/2018. No peripancreatic inflammatory changes. No dilatation of the main pancreatic duct or gland atrophy. Spleen: Normal in size without focal abnormality. Adrenals/Urinary Tract: The adrenal glands are unremarkable. Areas of scarring in the inferior aspect of the right kidney. Several bilateral renal hypodense lesions are not well characterized but appears similar to prior CT. Several small bilateral nonobstructing renal calculi measure up to 3-4 mm. There is no hydronephrosis on either side. There is symmetric enhancement and excretion of contrast by both kidneys. The visualized ureters appear unremarkable. The urinary bladder is collapsed. Stomach/Bowel: There is sigmoid diverticulosis without active inflammatory changes. There is postsurgical changes of distal small bowel resection with anastomotic suture. There is a small hiatal hernia. There is no bowel obstruction or active inflammation. The appendix is normal as visualized. Vascular/Lymphatic: Advanced aortoiliac atherosclerotic disease. The IVC is unremarkable. No portal venous gas. There is no adenopathy. Reproductive: Hysterectomy. A 1 cm hypodense lesion in the left ovary is not characterized but may represent a cyst. Other: Midline vertical anterior abdominal wall incisional scar. Musculoskeletal: Osteopenia with degenerative changes of the spine. No acute osseous pathology. Grade 1 L4-L5 anterolisthesis. IMPRESSION: 1. No acute intra-abdominal or pelvic pathology. No bowel obstruction or active inflammation. 2. Sigmoid diverticulosis. 3. Cirrhosis. 4. Stable hypodense lesions in the head and neck of the pancreas as seen on the prior CT. Follow-up as per recommendation of the prior MRI of 01/16/2018. 5. Aortic Atherosclerosis (ICD10-I70.0). Electronically Signed   By: Anner Crete M.D.   On: 03/06/2019 18:56    Procedures Procedures (including critical care  time)  Medications Ordered in UC Medications  ondansetron (ZOFRAN-ODT) disintegrating tablet 4 mg (4 mg Oral Given 03/06/19 1431)    Initial Impression / Assessment and Plan / UC Course  I have reviewed the triage vital signs and the nursing notes.  Pertinent labs & imaging results that were available during my care of the patient were reviewed by me and considered in my medical decision making (see chart for details).    85 year old female presents with generalized weakness, nausea, diarrhea, body aches, and ongoing poor p.o. intake and weight loss.  + COVID. Labs obtained and revealed marked hypokalemia at 2.6.  Patient hemodynamically stable.  Patient is being sent to the ER for potassium repletion, fluids.  Final Clinical Impressions(s) / UC Diagnoses   Final diagnoses:  Hypokalemia  COVID-19     Discharge Instructions     Go to the ER.  You need IV potassium.  Take care  Dr. Lacinda Axon     ED Prescriptions    None     PDMP not reviewed this encounter.     Coral Spikes, DO 03/07/19 1200

## 2019-03-06 NOTE — ED Notes (Addendum)
Pt given sandwich tray and beverage.  

## 2019-03-06 NOTE — ED Notes (Signed)
Patient transported to CT 

## 2019-03-06 NOTE — ED Triage Notes (Signed)
Patient states that she has been having body aches, weakness, nausea and vomiting since last Friday. Patient states that she can tolerate ice water, reports 10lbs of weight loss since this started.

## 2019-03-07 ENCOUNTER — Telehealth: Payer: Self-pay | Admitting: Emergency Medicine

## 2019-03-07 ENCOUNTER — Telehealth: Payer: Self-pay | Admitting: Unknown Physician Specialty

## 2019-03-07 LAB — NOVEL CORONAVIRUS, NAA (HOSP ORDER, SEND-OUT TO REF LAB; TAT 18-24 HRS): SARS-CoV-2, NAA: DETECTED — AB

## 2019-03-07 NOTE — Telephone Encounter (Signed)
Pt tested positive on rapid, however I called her and spoke to her regarding quarantine times, etc. She states she is taking something for the diarrhea and is feeling much better. All questions answered.

## 2019-03-07 NOTE — Telephone Encounter (Signed)
Called to discuss with patient about Covid symptoms and the use of bamlanivimab, a monoclonal antibody infusion for those with mild to moderate Covid symptoms and at a high risk of hospitalization.  Pt has had symptoms for 9-10 days and does not qualify for infusion at this time.    Quarantine information given.

## 2019-03-18 ENCOUNTER — Other Ambulatory Visit: Payer: Self-pay

## 2019-03-18 ENCOUNTER — Telehealth: Payer: Self-pay

## 2019-03-18 DIAGNOSIS — K649 Unspecified hemorrhoids: Secondary | ICD-10-CM

## 2019-03-18 MED ORDER — HYDROCORTISONE (PERIANAL) 2.5 % EX CREA: 1.0000 "application " | TOPICAL_CREAM | Freq: Two times a day (BID) | CUTANEOUS | 1 refills | Status: DC

## 2019-03-18 NOTE — Progress Notes (Unsigned)
Sent in Anusol cream

## 2019-03-18 NOTE — Telephone Encounter (Signed)
Pt called to see if something could be sent in for hemorrhoids- sent in Anusol cream to [facility] [place]

## 2019-04-18 ENCOUNTER — Telehealth: Payer: Self-pay | Admitting: Oncology

## 2019-04-18 NOTE — Telephone Encounter (Signed)
MD will not be in the office on 05-24-19. Appt has been changed to 05-27-19. Patient is aware.

## 2019-05-15 ENCOUNTER — Other Ambulatory Visit: Payer: Self-pay | Admitting: *Deleted

## 2019-05-15 ENCOUNTER — Inpatient Hospital Stay: Payer: Medicare HMO | Attending: Oncology

## 2019-05-15 ENCOUNTER — Other Ambulatory Visit: Payer: Self-pay

## 2019-05-15 DIAGNOSIS — I10 Essential (primary) hypertension: Secondary | ICD-10-CM | POA: Diagnosis not present

## 2019-05-15 DIAGNOSIS — D3A8 Other benign neuroendocrine tumors: Secondary | ICD-10-CM | POA: Insufficient documentation

## 2019-05-15 DIAGNOSIS — K219 Gastro-esophageal reflux disease without esophagitis: Secondary | ICD-10-CM | POA: Diagnosis not present

## 2019-05-15 DIAGNOSIS — Z008 Encounter for other general examination: Secondary | ICD-10-CM | POA: Diagnosis not present

## 2019-05-15 DIAGNOSIS — C7A011 Malignant carcinoid tumor of the jejunum: Secondary | ICD-10-CM | POA: Diagnosis present

## 2019-05-15 DIAGNOSIS — C7A8 Other malignant neuroendocrine tumors: Secondary | ICD-10-CM

## 2019-05-15 DIAGNOSIS — R32 Unspecified urinary incontinence: Secondary | ICD-10-CM | POA: Diagnosis not present

## 2019-05-15 DIAGNOSIS — Z853 Personal history of malignant neoplasm of breast: Secondary | ICD-10-CM | POA: Diagnosis not present

## 2019-05-15 DIAGNOSIS — E669 Obesity, unspecified: Secondary | ICD-10-CM | POA: Diagnosis not present

## 2019-05-15 DIAGNOSIS — Z880 Allergy status to penicillin: Secondary | ICD-10-CM | POA: Diagnosis not present

## 2019-05-15 DIAGNOSIS — Z87891 Personal history of nicotine dependence: Secondary | ICD-10-CM | POA: Diagnosis not present

## 2019-05-15 LAB — COMPREHENSIVE METABOLIC PANEL
ALT: 46 U/L — ABNORMAL HIGH (ref 0–44)
AST: 56 U/L — ABNORMAL HIGH (ref 15–41)
Albumin: 4.2 g/dL (ref 3.5–5.0)
Alkaline Phosphatase: 64 U/L (ref 38–126)
Anion gap: 11 (ref 5–15)
BUN: 13 mg/dL (ref 8–23)
CO2: 25 mmol/L (ref 22–32)
Calcium: 10.4 mg/dL — ABNORMAL HIGH (ref 8.9–10.3)
Chloride: 98 mmol/L (ref 98–111)
Creatinine, Ser: 0.88 mg/dL (ref 0.44–1.00)
GFR calc Af Amer: >60 mL/min (ref >60)
GFR calc non Af Amer: >60 mL/min (ref >60)
Glucose, Bld: 93 mg/dL (ref 70–99)
Potassium: 3.4 mmol/L — ABNORMAL LOW (ref 3.5–5.1)
Sodium: 134 mmol/L — ABNORMAL LOW (ref 135–145)
Total Bilirubin: 1 mg/dL (ref 0.3–1.2)
Total Protein: 7.5 g/dL (ref 6.5–8.1)

## 2019-05-16 LAB — CHROMOGRANIN A: Chromogranin A (ng/mL): 365 ng/mL — ABNORMAL HIGH (ref 0.0–101.8)

## 2019-05-22 ENCOUNTER — Ambulatory Visit
Admission: RE | Admit: 2019-05-22 | Discharge: 2019-05-22 | Disposition: A | Discharge status: Home / Self Care | Payer: Medicare HMO | Source: Ambulatory Visit | Attending: Oncology | Admitting: Oncology

## 2019-05-22 ENCOUNTER — Other Ambulatory Visit: Payer: Medicare HMO

## 2019-05-22 ENCOUNTER — Other Ambulatory Visit: Payer: Self-pay

## 2019-05-22 DIAGNOSIS — C7A8 Other malignant neuroendocrine tumors: Secondary | ICD-10-CM

## 2019-05-22 DIAGNOSIS — R918 Other nonspecific abnormal finding of lung field: Secondary | ICD-10-CM | POA: Diagnosis not present

## 2019-05-22 DIAGNOSIS — K7460 Unspecified cirrhosis of liver: Secondary | ICD-10-CM | POA: Diagnosis not present

## 2019-05-22 MED ORDER — IOHEXOL 300 MG/ML  SOLN
100.0000 mL | Freq: Once | INTRAMUSCULAR | Status: AC | PRN
  Administered 2019-05-22: 100 mL via INTRAVENOUS

## 2019-05-24 ENCOUNTER — Ambulatory Visit: Payer: Medicare HMO | Admitting: Oncology

## 2019-05-27 ENCOUNTER — Inpatient Hospital Stay (HOSPITAL_BASED_OUTPATIENT_CLINIC_OR_DEPARTMENT_OTHER): Payer: Medicare HMO | Admitting: Oncology

## 2019-05-27 ENCOUNTER — Encounter: Payer: Self-pay | Admitting: Oncology

## 2019-05-27 DIAGNOSIS — C7A8 Other malignant neuroendocrine tumors: Secondary | ICD-10-CM

## 2019-05-27 NOTE — Progress Notes (Signed)
Go over ct results. Wants refill of omeprazole

## 2019-05-29 NOTE — Progress Notes (Signed)
I connected with Kaitlyn Oliver on 05/29/19 at  2:45 PM EDT by video enabled telemedicine visit and verified that I am speaking with the correct person using two identifiers.   I discussed the limitations, risks, security and privacy concerns of performing an evaluation and management service by telemedicine and the availability of in-person appointments. I also discussed with the patient that there may be a patient responsible charge related to this service. The patient expressed understanding and agreed to proceed.  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 Patient also was admitted for an episode of bowel obstruction about 2 months ago which resolved conservatively. CT abdomen done during this admission showed an abnormal small bowel loop in the right lower quadrant and adjacent 4 cm necrotic mesenteric lesion which could be a potential lymph node. Patient also noted to have hypoattenuating cystic lesions in the head of the pancreas. Hepatic cirrhosis. This was followed by MRI of the abdomen with and without contrast which again showed multiple cystic lesions in the pancreas either pancreatic pseudocyst or cystic neoplasm of the pancreas.  Patient had exploratory laparotomy and resection of the small bowel tumor. Pathology showed well-differentiated neuroendocrine tumor grade 1, 3.7 cm mitotic rate less than 2 per 10 high-power fields. Ki-67 less than 3%. Tumor invades through the muscularis propria into subserosal tissue. All margins negative. L VIN PNI present. Large mesenteric mass  greater than 2 cm present. Regional lymph nodes 5 out of 14 were involved. pT3N2. She is currently under surveillance   Interval history : Patient reports feeling well. Denies any complaints at this time. Denies any palpitations/ diarrhea   Review of Systems  Constitutional: Negative for chills, fever, malaise/fatigue and weight loss.  HENT: Negative for congestion, ear discharge and nosebleeds.   Eyes: Negative for blurred vision.  Respiratory: Negative for cough, hemoptysis, sputum production, shortness of breath and wheezing.   Cardiovascular: Negative for chest pain, palpitations, orthopnea and claudication.  Gastrointestinal: Negative for abdominal pain, blood in stool, constipation, diarrhea, heartburn, melena, nausea and vomiting.  Genitourinary: Negative for dysuria, flank pain, frequency, hematuria and urgency.  Musculoskeletal: Negative for back pain, joint pain and myalgias.  Skin: Negative for rash.  Neurological: Negative for dizziness, tingling, focal weakness, seizures, weakness and headaches.  Endo/Heme/Allergies: Does not bruise/bleed easily.  Psychiatric/Behavioral: Negative for depression and suicidal ideas. The patient does not have insomnia.     Allergies  Allergen Reactions  . Adhesive [Tape] Dermatitis    Can not use plastic tape. Paper tape ok   . Penicillin G Itching    Has patient had a PCN reaction causing immediate rash, facial/tongue/throat swelling, SOB or lightheadedness with hypotension: No Has patient had a PCN reaction causing severe rash involving mucus membranes or skin necrosis: No Has patient had a PCN reaction that required hospitalization: No Has patient had a PCN reaction occurring within the last 10 years: No If all of the above answers are "NO", then may proceed with Cephalosporin use.   Marland Kitchen Hydrocodone-Acetaminophen Itching and Rash    Past Medical History:  Diagnosis Date  . Anxiety   . Arthritis  thumbs, hands  . Cancer [facility])  1991   bilateral breast  . Depression   . GERD (gastroesophageal reflux disease)   . Headache    occasional - pinched nerve in neck  . Hypertension   . Hypothyroidism   . Thyroid disease   . Wears dentures    full upper, partial lower    Past Surgical History:  Procedure Laterality Date  . BOWEL RESECTION  01/17/2018   Procedure: SMALL BOWEL RESECTION;  Surgeon: Jules Husbands, MD;  Location: [facility] ORS;  Service: General;;  . BREAST SURGERY Bilateral 1991   mastectomy  . CATARACT EXTRACTION Bilateral   . CHOLECYSTECTOMY    . COLONOSCOPY  2013   Dr Gustavo Lah  . HALLUX VALGUS AUSTIN Right 06/03/2015   Procedure: HALLUX VALGUS AUSTIN;  Surgeon: Samara Deist, DPM;  Location: [place];  Service: Podiatry;  Laterality: Right;  WITH POPLITEAL  . KNEE ARTHROSCOPY WITH MEDIAL MENISECTOMY Right 08/30/2016   Procedure: KNEE ARTHROSCOPY WITH PARTIAL MEDIAL MENISECTOMY and Partial Lateral Menisectomy;  Surgeon: Hessie Knows, MD;  Location: [facility];  Service: Orthopedics;  Laterality: Right;  . KNEE ARTHROSCOPY WITH SUBCHONDROPLASTY Right 08/30/2016   Procedure: KNEE ARTHROSCOPY WITH SUBCHONDROPLASTY;  Surgeon: Hessie Knows, MD;  Location: [facility];  Service: Orthopedics;  Laterality: Right;  . LAPAROTOMY N/A 01/17/2018   Procedure: EXPLORATORY LAPAROTOMY;  Surgeon: Jules Husbands, MD;  Location: [facility];  Service: General;  Laterality: N/A;  . MASTECTOMY Bilateral   . ROTATOR CUFF REPAIR Bilateral   . TONSILLECTOMY    . TOTAL KNEE ARTHROPLASTY Left   . TOTAL KNEE ARTHROPLASTY Right 05/30/2017   Procedure: TOTAL KNEE ARTHROPLASTY;  Surgeon: Hessie Knows, MD;  Location: [facility];  Service: Orthopedics;  Laterality: Right;  Marland Kitchen VAGINAL HYSTERECTOMY      Social History   Socioeconomic History  . Marital status: Soil scientist    Spouse name: Not on file  . Number of children: 1  . Years of education: Not on file  . Highest education level: 12th grade  Occupational History  .  Occupation: Retired  Tobacco Use  . Smoking status: Former Smoker    Packs/day: 0.25    Years: 10.00    Pack years: 2.50    Types: Cigarettes    Quit date: 1991    Years since quitting: 30.2  . Smokeless tobacco: Never Used  . Tobacco comment: smoking cessation materials not required  Substance and Sexual Activity  . Alcohol use: No    Alcohol/week: 0.0 standard drinks  . Drug use: No  . Sexual activity: Not Currently  Other Topics Concern  . Not on file  Social History Narrative  . Not on file   Social Determinants of Health   Financial Resource Strain:   . Difficulty of Paying Living Expenses:   Food Insecurity:   . Worried About Charity fundraiser in the Last Year:   . Arboriculturist in the Last Year:   Transportation Needs:   . Film/video editor (Medical):   Marland Kitchen Lack of Transportation (Non-Medical):   Physical Activity:   . Days of Exercise per Week:   . Minutes of Exercise per Session:   Stress:   . Feeling of Stress :   Social Connections:   . Frequency of Communication with Friends and Family:   . Frequency of Social Gatherings with Friends and Family:   . Attends Religious Services:   . Active Member of Clubs or Organizations:   .  Attends Archivist Meetings:   Marland Kitchen Marital Status:   Intimate Partner Violence:   . Fear of Current or Ex-Partner:   . Emotionally Abused:   Marland Kitchen Physically Abused:   . Sexually Abused:     Family History  Problem Relation Age of Onset  . Pulmonary embolism Mother   . Healthy Father      Current Outpatient Medications:  .  acetaminophen (TYLENOL) 325 MG tablet, Take 1-2 tablets (325-650 mg total) by mouth every 6 (six) hours as needed for mild pain (pain score 1-3 or temp > 100.5)., Disp: , Rfl:  .  amLODipine (NORVASC) 5 MG tablet, Take 1 tablet (5 mg total) by mouth daily., Disp: 90 tablet, Rfl: 1 .  busPIRone (BUSPAR) 7.5 MG tablet, Take 1 tablet (7.5 mg total) by mouth 2 (two) times daily., Disp: 180 tablet,  Rfl: 1 .  hydrochlorothiazide (HYDRODIURIL) 12.5 MG tablet, Take 1 tablet (12.5 mg total) by mouth daily., Disp: 90 tablet, Rfl: 1 .  omeprazole (PRILOSEC) 20 MG capsule, Take 1 capsule (20 mg total) by mouth daily., Disp: 30 capsule, Rfl: 3  CT Chest W Contrast  Result Date: 05/22/2019 CLINICAL DATA:  History of neuroendocrine tumor. EXAM: CT CHEST, ABDOMEN, AND PELVIS WITH CONTRAST TECHNIQUE: Multidetector CT imaging of the chest, abdomen and pelvis was performed following the standard protocol during bolus administration of intravenous contrast. CONTRAST:  150m OMNIPAQUE IOHEXOL 300 MG/ML  SOLN COMPARISON:  PET-CT 12/17/2018 and CT scan 03/06/2019 FINDINGS: CT CHEST FINDINGS Cardiovascular: The heart is normal in size for age. No pericardial effusion. Mild tortuosity and ectasia of the thoracic aorta but no focal aneurysm or dissection. The branch vessels are patent. Stable coronary artery calcifications. The pulmonary arteries appear normal. Mediastinum/Nodes: No mediastinal or hilar mass or adenopathy. The esophagus is grossly normal. The thyroid gland is unremarkable. Lungs/Pleura: Stable mild emphysematous changes. Mild peripheral pulmonary scarring type changes. No infiltrates, edema or effusions. No worrisome pulmonary lesions. A few small stable pulmonary nodules unchanged since 08/15/2018 stable 4.5 mm left lower lobe nodule on image number 91/4. Stable 3.5 mm right lower lobe nodule on image number 94/4. Musculoskeletal: No significant bony findings. CT ABDOMEN PELVIS FINDINGS Hepatobiliary: Stable cirrhotic changes involving the liver but no focal hepatic lesions or intrahepatic biliary dilatation. The gallbladder is surgically absent. No common bile duct dilatation. Pancreas: Stable small cystic areas in the pancreatic head, likely postinflammatory cyst. No change since prior studies. No pancreatic ductal dilatation. Spleen: Spleen is mildly enlarged but appears stable. Adrenals/Urinary Tract:  The adrenal glands and kidneys are unremarkable. No worrisome adrenal or renal lesions. Small scattered renal cysts. Stable scarring changes involving the right kidney likely due to prior infarctions or infection. The bladder is unremarkable. Stomach/Bowel: The stomach, duodenum, small bowel and colon are unremarkable. No acute inflammatory changes, mass lesions or obstructive findings. The terminal ileum is normal. Evidence of prior bowel surgery with small bowel anastomosis in the right upper pelvis. No findings suspicious for recurrent tumor. Moderate scattered colonic diverticulosis without findings for acute diverticulitis. Vascular/Lymphatic: Stable advanced atherosclerotic calcifications involving the aorta iliac arteries. No aneurysm or dissection. The major venous structures are patent. No mesenteric or retroperitoneal mass or adenopathy. Reproductive: The uterus is surgically absent. Both ovaries are still present and appear normal. Other: No pelvic mass or adenopathy. No free pelvic fluid collections. No inguinal mass or adenopathy. No abdominal wall hernia or subcutaneous lesions. Musculoskeletal: No significant bony findings. IMPRESSION: 1. Stable CT appearance of the chest,  abdomen and pelvis. No findings for recurrent tumor, adenopathy or metastatic disease. 2. Stable small pulmonary nodules, likely benign. 3. Stable cirrhotic changes involving the liver with mild splenomegaly. No ascites. 4. Stable small cystic areas in the pancreatic head, likely postinflammatory cysts. 5. Stable advanced atherosclerotic calcifications involving the aorta and iliac arteries. Aortic Atherosclerosis (ICD10-I70.0). Electronically Signed   By: Marijo Sanes M.D.   On: 05/22/2019 13:02   CT Abdomen Pelvis W Contrast  Result Date: 05/22/2019 CLINICAL DATA:  History of neuroendocrine tumor. EXAM: CT CHEST, ABDOMEN, AND PELVIS WITH CONTRAST TECHNIQUE: Multidetector CT imaging of the chest, abdomen and pelvis was  performed following the standard protocol during bolus administration of intravenous contrast. CONTRAST:  19m OMNIPAQUE IOHEXOL 300 MG/ML  SOLN COMPARISON:  PET-CT 12/17/2018 and CT scan 03/06/2019 FINDINGS: CT CHEST FINDINGS Cardiovascular: The heart is normal in size for age. No pericardial effusion. Mild tortuosity and ectasia of the thoracic aorta but no focal aneurysm or dissection. The branch vessels are patent. Stable coronary artery calcifications. The pulmonary arteries appear normal. Mediastinum/Nodes: No mediastinal or hilar mass or adenopathy. The esophagus is grossly normal. The thyroid gland is unremarkable. Lungs/Pleura: Stable mild emphysematous changes. Mild peripheral pulmonary scarring type changes. No infiltrates, edema or effusions. No worrisome pulmonary lesions. A few small stable pulmonary nodules unchanged since 08/15/2018 stable 4.5 mm left lower lobe nodule on image number 91/4. Stable 3.5 mm right lower lobe nodule on image number 94/4. Musculoskeletal: No significant bony findings. CT ABDOMEN PELVIS FINDINGS Hepatobiliary: Stable cirrhotic changes involving the liver but no focal hepatic lesions or intrahepatic biliary dilatation. The gallbladder is surgically absent. No common bile duct dilatation. Pancreas: Stable small cystic areas in the pancreatic head, likely postinflammatory cyst. No change since prior studies. No pancreatic ductal dilatation. Spleen: Spleen is mildly enlarged but appears stable. Adrenals/Urinary Tract: The adrenal glands and kidneys are unremarkable. No worrisome adrenal or renal lesions. Small scattered renal cysts. Stable scarring changes involving the right kidney likely due to prior infarctions or infection. The bladder is unremarkable. Stomach/Bowel: The stomach, duodenum, small bowel and colon are unremarkable. No acute inflammatory changes, mass lesions or obstructive findings. The terminal ileum is normal. Evidence of prior bowel surgery with small  bowel anastomosis in the right upper pelvis. No findings suspicious for recurrent tumor. Moderate scattered colonic diverticulosis without findings for acute diverticulitis. Vascular/Lymphatic: Stable advanced atherosclerotic calcifications involving the aorta iliac arteries. No aneurysm or dissection. The major venous structures are patent. No mesenteric or retroperitoneal mass or adenopathy. Reproductive: The uterus is surgically absent. Both ovaries are still present and appear normal. Other: No pelvic mass or adenopathy. No free pelvic fluid collections. No inguinal mass or adenopathy. No abdominal wall hernia or subcutaneous lesions. Musculoskeletal: No significant bony findings. IMPRESSION: 1. Stable CT appearance of the chest, abdomen and pelvis. No findings for recurrent tumor, adenopathy or metastatic disease. 2. Stable small pulmonary nodules, likely benign. 3. Stable cirrhotic changes involving the liver with mild splenomegaly. No ascites. 4. Stable small cystic areas in the pancreatic head, likely postinflammatory cysts. 5. Stable advanced atherosclerotic calcifications involving the aorta and iliac arteries. Aortic Atherosclerosis (ICD10-I70.0). Electronically Signed   By: PMarijo SanesM.D.   On: 05/22/2019 13:02    No images are attached to the encounter.   CMP Latest Ref Rng & Units 05/15/2019  Glucose 70 - 99 mg/dL 93  BUN 8 - 23 mg/dL 13  Creatinine 0.44 - 1.00 mg/dL 0.88  Sodium 135 - 145 mmol/L 134(L)  Potassium 3.5 - 5.1 mmol/L 3.4(L)  Chloride 98 - 111 mmol/L 98  CO2 22 - 32 mmol/L 25  Calcium 8.9 - 10.3 mg/dL 10.4(H)  Total Protein 6.5 - 8.1 g/dL 7.5  Total Bilirubin 0.3 - 1.2 mg/dL 1.0  Alkaline Phos 38 - 126 U/L 64  AST 15 - 41 U/L 56(H)  ALT 0 - 44 U/L 46(H)   CBC Latest Ref Rng & Units 03/06/2019  WBC 4.0 - 10.5 K/uL 5.0  Hemoglobin 12.0 - 15.0 g/dL 12.9  Hematocrit 36.0 - 46.0 % 37.9  Platelets 150 - 400 K/uL 129(L)     Observation/objective:appears in no acute  distress over video visit today. Breathing is non labored.   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  This is a routine follow-up visit to discuss CT scan results  CT scan presently shows no concerning findings of recurrence.  She has stable changes of cirrhosis and splenomegaly which has remained unchanged.she does have elevated chromogranin A levels which have overall been trending down.  Today is 365 as compared to 625 in September 2020.  Continue to monitor    Follow-up instructions: I will see her back in 6 months time with CBC with differential CMP and chromogranin A level  I discussed the assessment and treatment plan with the patient. The patient was provided an opportunity to ask questions and all were answered. The patient agreed with the plan and demonstrated an understanding of the instructions.   The patient was advised to call back or seek an in-person evaluation if the symptoms worsen or if the condition fails to improve as anticipated.   Visit Diagnosis: 1. Neuroendocrine carcinoma of small bowel ([place])     Dr. Randa Evens, MD, MPH [facility] Tel- [phone] 05/29/2019 1:32 PM

## 2019-05-29 NOTE — Addendum Note (Signed)
Addended by: Randa Evens C on: 05/29/2019 01:40 PM   Modules accepted: Orders

## 2019-07-04 ENCOUNTER — Ambulatory Visit (INDEPENDENT_AMBULATORY_CARE_PROVIDER_SITE_OTHER): Payer: Medicare HMO | Admitting: Family Medicine

## 2019-07-04 ENCOUNTER — Encounter: Payer: Self-pay | Admitting: Family Medicine

## 2019-07-04 ENCOUNTER — Other Ambulatory Visit: Payer: Self-pay

## 2019-07-04 DIAGNOSIS — F329 Major depressive disorder, single episode, unspecified: Secondary | ICD-10-CM | POA: Diagnosis not present

## 2019-07-04 DIAGNOSIS — F419 Anxiety disorder, unspecified: Secondary | ICD-10-CM | POA: Diagnosis not present

## 2019-07-04 DIAGNOSIS — K219 Gastro-esophageal reflux disease without esophagitis: Secondary | ICD-10-CM | POA: Diagnosis not present

## 2019-07-04 DIAGNOSIS — R69 Illness, unspecified: Secondary | ICD-10-CM | POA: Diagnosis not present

## 2019-07-04 DIAGNOSIS — I10 Essential (primary) hypertension: Secondary | ICD-10-CM | POA: Diagnosis not present

## 2019-07-04 DIAGNOSIS — F32A Depression, unspecified: Secondary | ICD-10-CM

## 2019-07-04 MED ORDER — BUSPIRONE HCL 7.5 MG PO TABS: 7.5000 mg | ORAL_TABLET | Freq: Two times a day (BID) | ORAL | 1 refills | Status: DC

## 2019-07-04 MED ORDER — AMLODIPINE BESYLATE 5 MG PO TABS: 5.0000 mg | ORAL_TABLET | Freq: Every day | ORAL | 1 refills | Status: DC

## 2019-07-04 MED ORDER — OMEPRAZOLE 20 MG PO CPDR: 20.0000 mg | DELAYED_RELEASE_CAPSULE | Freq: Every day | ORAL | 1 refills | Status: DC

## 2019-07-04 MED ORDER — HYDROCHLOROTHIAZIDE 12.5 MG PO TABS: 12.5000 mg | ORAL_TABLET | Freq: Every day | ORAL | 1 refills | Status: DC

## 2019-07-04 NOTE — Progress Notes (Signed)
Date:  07/04/2019   Name:  Kaitlyn Oliver   DOB:  08-05-1933   MRN:  [id number]   Chief Complaint: Hypertension, Anxiety, and Gastroesophageal Reflux  Hypertension This is a chronic problem. The current episode started more than 1 year ago. The problem has been gradually improving since onset. The problem is controlled. Associated symptoms include anxiety. Pertinent negatives include no blurred vision, chest pain, headaches, malaise/fatigue, neck pain, orthopnea, palpitations, peripheral edema, PND, shortness of breath or sweats. There are no associated agents to hypertension. Risk factors for coronary artery disease include dyslipidemia. Past treatments include calcium channel blockers and diuretics. The current treatment provides moderate improvement. There are no compliance problems.  There is no history of angina, kidney disease, CAD/MI, CVA, heart failure, left ventricular hypertrophy, PVD or retinopathy. There is no history of chronic renal disease, a hypertension causing med or renovascular disease.  Anxiety Presents for follow-up visit. Symptoms include excessive worry and nervous/anxious behavior. Patient reports no chest pain, compulsions, confusion, decreased concentration, depressed mood, dizziness, dry mouth, feeling of choking, hyperventilation, impotence, insomnia, irritability, malaise, muscle tension, nausea, obsessions, palpitations, panic, restlessness or shortness of breath. Symptoms occur occasionally.    Gastroesophageal Reflux She reports no abdominal pain, no belching, no chest pain, no choking, no coughing, no dysphagia, no early satiety, no globus sensation, no heartburn, no hoarse voice, no nausea, no sore throat, no stridor, no tooth decay or no wheezing. This is a chronic problem. The current episode started more than 1 year ago. The problem has been gradually improving. The symptoms are aggravated by certain foods. Pertinent negatives include no fatigue.    Lab  Results  Component Value Date   CREATININE 0.88 05/15/2019   BUN 13 05/15/2019   NA 134 (L) 05/15/2019   K 3.4 (L) 05/15/2019   CL 98 05/15/2019   CO2 25 05/15/2019   Lab Results  Component Value Date   CHOL 227 (H) 02/15/2017   HDL 33 (L) 02/15/2017   LDLCALC Comment 02/15/2017   TRIG 534 (H) 02/15/2017   CHOLHDL 6.9 (H) 02/15/2017   Lab Results  Component Value Date   TSH 3.820 03/01/2019   No results found for: HGBA1C Lab Results  Component Value Date   WBC 5.0 03/06/2019   HGB 12.9 03/06/2019   HCT 37.9 03/06/2019   MCV 81.2 03/06/2019   PLT 129 (L) 03/06/2019   Lab Results  Component Value Date   ALT 46 (H) 05/15/2019   AST 56 (H) 05/15/2019   ALKPHOS 64 05/15/2019   BILITOT 1.0 05/15/2019     Review of Systems  Constitutional: Negative.  Negative for chills, fatigue, fever, irritability, malaise/fatigue and unexpected weight change.  HENT: Negative for congestion, ear discharge, ear pain, hoarse voice, rhinorrhea, sinus pressure, sneezing and sore throat.   Eyes: Negative for blurred vision, photophobia, pain, discharge, redness and itching.  Respiratory: Negative for cough, choking, shortness of breath, wheezing and stridor.   Cardiovascular: Negative for chest pain, palpitations, orthopnea and PND.  Gastrointestinal: Negative for abdominal pain, blood in stool, constipation, diarrhea, dysphagia, heartburn, nausea and vomiting.  Endocrine: Negative for cold intolerance, heat intolerance, polydipsia, polyphagia and polyuria.  Genitourinary: Negative for dysuria, flank pain, frequency, hematuria, impotence, menstrual problem, pelvic pain, urgency, vaginal bleeding and vaginal discharge.  Musculoskeletal: Negative for arthralgias, back pain, myalgias and neck pain.  Skin: Negative for rash.  Allergic/Immunologic: Negative for environmental allergies and food allergies.  Neurological: Negative for dizziness, weakness, light-headedness, numbness and headaches.  Hematological: Negative for adenopathy. Does not bruise/bleed easily.  Psychiatric/Behavioral: Negative for confusion, decreased concentration and dysphoric mood. The patient is nervous/anxious. The patient does not have insomnia.     Patient Active Problem List   Diagnosis Date Noted  . Hypokalemia 08/20/2018  . 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 Goals of care, counseling/discussion 01/26/2018  . Small bowel tumor   . Pancreatic cyst 01/15/2018  . Anxiety and depression 01/15/2018  . Pancreas cyst 01/15/2018  . Bowel obstruction ([place]) 11/29/2017  . Small bowel obstruction ([place])   . Thoracoabdominal aortic aneurysm (TAAA) ([place]) 11/19/2017  . Status post total knee replacement using cement, right 05/30/2017  . GERD (gastroesophageal reflux disease) 12/22/2015  . Adenomatous polyp 09/11/2014  . Bilateral cataracts 09/11/2014  . Gastric catarrh 09/11/2014  . Gastroduodenal ulcer 09/11/2014  . Hypothyroidism 09/11/2014  . Cardiac murmur 10/08/2013  . Essential (primary) hypertension 10/08/2013  . Combined fat and carbohydrate induced hyperlipemia 10/08/2013    Allergies  Allergen Reactions  . Adhesive [Tape] Dermatitis    Can not use plastic tape. Paper tape ok   . Penicillin G Itching    Has patient had a PCN reaction causing immediate rash, facial/tongue/throat swelling, SOB or lightheadedness with hypotension: No Has patient had a PCN reaction causing severe rash involving mucus membranes or skin necrosis: No Has patient had a PCN reaction that required hospitalization: No Has patient had a PCN reaction occurring within the last 10 years: No If all of the above answers are "NO", then may proceed with Cephalosporin use.   Marland Kitchen Hydrocodone-Acetaminophen Itching and Rash    Past Surgical History:  Procedure Laterality Date  . BOWEL RESECTION  01/17/2018   Procedure: SMALL BOWEL RESECTION;  Surgeon: Jules Husbands, MD;  Location: [facility];  Service:  General;;  . BREAST SURGERY Bilateral 1991   mastectomy  . CATARACT EXTRACTION Bilateral   . CHOLECYSTECTOMY    . COLONOSCOPY  2013   Dr Gustavo Lah  . HALLUX VALGUS AUSTIN Right 06/03/2015   Procedure: HALLUX VALGUS AUSTIN;  Surgeon: Samara Deist, DPM;  Location: [place];  Service: Podiatry;  Laterality: Right;  WITH POPLITEAL  . KNEE ARTHROSCOPY WITH MEDIAL MENISECTOMY Right 08/30/2016   Procedure: KNEE ARTHROSCOPY WITH PARTIAL MEDIAL MENISECTOMY and Partial Lateral Menisectomy;  Surgeon: Hessie Knows, MD;  Location: [facility];  Service: Orthopedics;  Laterality: Right;  . KNEE ARTHROSCOPY WITH SUBCHONDROPLASTY Right 08/30/2016   Procedure: KNEE ARTHROSCOPY WITH SUBCHONDROPLASTY;  Surgeon: Hessie Knows, MD;  Location: [facility];  Service: Orthopedics;  Laterality: Right;  . LAPAROTOMY N/A 01/17/2018   Procedure: EXPLORATORY LAPAROTOMY;  Surgeon: Jules Husbands, MD;  Location: [facility];  Service: General;  Laterality: N/A;  . MASTECTOMY Bilateral   . ROTATOR CUFF REPAIR Bilateral   . TONSILLECTOMY    . TOTAL KNEE ARTHROPLASTY Left   . TOTAL KNEE ARTHROPLASTY Right 05/30/2017   Procedure: TOTAL KNEE ARTHROPLASTY;  Surgeon: Hessie Knows, MD;  Location: [facility];  Service: Orthopedics;  Laterality: Right;  Marland Kitchen VAGINAL HYSTERECTOMY      Social History   Tobacco Use  . Smoking status: Former Smoker    Packs/day: 0.25    Years: 10.00    Pack years: 2.50    Types: Cigarettes    Quit date: 1991    Years since quitting: 30.3  . Smokeless tobacco: Never Used  . Tobacco comment: smoking cessation materials not required  Substance Use Topics  . Alcohol use: No    Alcohol/week:  0.0 standard drinks  . Drug use: No     Medication list has been reviewed and updated.  Current Meds  Medication Sig  . acetaminophen (TYLENOL) 325 MG tablet Take 1-2 tablets (325-650 mg total) by mouth every 6 (six) hours as needed for mild pain (pain score 1-3 or temp > 100.5).  Marland Kitchen amLODipine (NORVASC)  5 MG tablet Take 1 tablet (5 mg total) by mouth daily.  . busPIRone (BUSPAR) 7.5 MG tablet Take 1 tablet (7.5 mg total) by mouth 2 (two) times daily.  . hydrochlorothiazide (HYDRODIURIL) 12.5 MG tablet Take 1 tablet (12.5 mg total) by mouth daily.  Marland Kitchen omeprazole (PRILOSEC) 20 MG capsule Take 1 capsule (20 mg total) by mouth daily.    PHQ 2/9 Scores 07/04/2019 03/01/2019 09/25/2018 02/02/2018  PHQ - 2 Score 0 1 0 0  PHQ- 9 Score 0 4 0 0    BP Readings from Last 3 Encounters:  07/04/19 130/80  03/06/19 (!) 145/89  03/06/19 (!) 151/83    Physical Exam Vitals and nursing note reviewed.  Constitutional:      Appearance: She is well-developed.  HENT:     Head: Normocephalic.     Right Ear: Tympanic membrane, ear canal and external ear normal.     Left Ear: Tympanic membrane, ear canal and external ear normal.     Nose: Nose normal. No congestion or rhinorrhea.     Mouth/Throat:     Mouth: Mucous membranes are moist.  Eyes:     General: Lids are everted, no foreign bodies appreciated. No scleral icterus.       Left eye: No foreign body or hordeolum.     Conjunctiva/sclera: Conjunctivae normal.     Right eye: Right conjunctiva is not injected.     Left eye: Left conjunctiva is not injected.     Pupils: Pupils are equal, round, and reactive to light.  Neck:     Thyroid: No thyromegaly.     Vascular: No JVD.     Trachea: No tracheal deviation.  Cardiovascular:     Rate and Rhythm: Normal rate and regular rhythm.     Heart sounds: Normal heart sounds. No murmur. No friction rub. No gallop.   Pulmonary:     Effort: Pulmonary effort is normal. No respiratory distress.     Breath sounds: Normal breath sounds. No wheezing, rhonchi or rales.  Abdominal:     General: Bowel sounds are normal.     Palpations: Abdomen is soft. There is no mass.     Tenderness: There is no abdominal tenderness. There is no guarding or rebound.  Musculoskeletal:        General: No tenderness. Normal range of  motion.     Cervical back: Normal range of motion and neck supple.  Lymphadenopathy:     Cervical: No cervical adenopathy.  Skin:    General: Skin is warm.     Capillary Refill: Capillary refill takes less than 2 seconds.     Findings: No rash.  Neurological:     Mental Status: She is alert and oriented to person, place, and time.     Cranial Nerves: No cranial nerve deficit.     Deep Tendon Reflexes: Reflexes normal.  Psychiatric:        Mood and Affect: Mood is not anxious or depressed.     Wt Readings from Last 3 Encounters:  07/04/19 193 lb (87.5 kg)  03/06/19 180 lb (81.6 kg)  03/06/19 185 lb (83.9 kg)  BP 130/80   Pulse 64   Ht 5\' 5"  (1.651 m)   Wt 193 lb (87.5 kg)   BMI 32.12 kg/m    Assessment and Plan: 1. Anxiety and depression Chronic.  Controlled.  Stable.  Continue buspirone 7.5 mg twice a day. - busPIRone (BUSPAR) 7.5 MG tablet; Take 1 tablet (7.5 mg total) by mouth 2 (two) times daily.  Dispense: 180 tablet; Refill: 1  2. Essential (primary) hypertension Chronic.  Controlled.  Stable.  Continue hydrochlorothiazide 12.5 mg and amlodipine 5 mg once a day. - hydrochlorothiazide (HYDRODIURIL) 12.5 MG tablet; Take 1 tablet (12.5 mg total) by mouth daily.  Dispense: 90 tablet; Refill: 1 - amLODipine (NORVASC) 5 MG tablet; Take 1 tablet (5 mg total) by mouth daily.  Dispense: 90 tablet; Refill: 1  3. Gastroesophageal reflux disease without esophagitis Chronic.  Controlled.  Stable.  Patient was unable to tolerate pantoprazole but has been doing well on omeprazole 20 mg once a day. - omeprazole (PRILOSEC) 20 MG capsule; Take 1 capsule (20 mg total) by mouth daily.  Dispense: 90 capsule; Refill: 1

## 2019-08-15 DIAGNOSIS — R69 Illness, unspecified: Secondary | ICD-10-CM | POA: Diagnosis not present

## 2019-08-22 ENCOUNTER — Ambulatory Visit: Payer: Medicare HMO | Admitting: Family Medicine

## 2019-09-03 ENCOUNTER — Other Ambulatory Visit: Payer: Self-pay

## 2019-09-03 ENCOUNTER — Ambulatory Visit (INDEPENDENT_AMBULATORY_CARE_PROVIDER_SITE_OTHER): Payer: Medicare HMO | Admitting: Family Medicine

## 2019-09-03 ENCOUNTER — Encounter: Payer: Self-pay | Admitting: Family Medicine

## 2019-09-03 VITALS — BP 138/70 | HR 76 | Ht 65.0 in | Wt 198.0 lb

## 2019-09-03 DIAGNOSIS — E034 Atrophy of thyroid (acquired): Secondary | ICD-10-CM | POA: Diagnosis not present

## 2019-09-03 NOTE — Progress Notes (Signed)
Date:  09/03/2019   Name:  Kaitlyn Oliver   DOB:  Mar 08, 1934   MRN:  [id number]   Chief Complaint: Hypothyroidism (wants thyroid checked)  Thyroid Problem Presents for follow-up visit. Symptoms include fatigue and heat intolerance. Patient reports no anxiety, cold intolerance, constipation, depressed mood, diaphoresis, diarrhea, dry skin, hair loss, hoarse voice, leg swelling, menstrual problem, nail problem, palpitations, tremors, visual change, weight gain or weight loss. The symptoms have been worsening.    Lab Results  Component Value Date   CREATININE 0.88 05/15/2019   BUN 13 05/15/2019   NA 134 (L) 05/15/2019   K 3.4 (L) 05/15/2019   CL 98 05/15/2019   CO2 25 05/15/2019   Lab Results  Component Value Date   CHOL 227 (H) 02/15/2017   HDL 33 (L) 02/15/2017   LDLCALC Comment 02/15/2017   TRIG 534 (H) 02/15/2017   CHOLHDL 6.9 (H) 02/15/2017   Lab Results  Component Value Date   TSH 3.820 03/01/2019   No results found for: HGBA1C Lab Results  Component Value Date   WBC 5.0 03/06/2019   HGB 12.9 03/06/2019   HCT 37.9 03/06/2019   MCV 81.2 03/06/2019   PLT 129 (L) 03/06/2019   Lab Results  Component Value Date   ALT 46 (H) 05/15/2019   AST 56 (H) 05/15/2019   ALKPHOS 64 05/15/2019   BILITOT 1.0 05/15/2019     Review of Systems  Constitutional: Positive for fatigue. Negative for chills, diaphoresis, fever, unexpected weight change, weight gain and weight loss.  HENT: Negative for congestion, ear discharge, ear pain, hoarse voice, rhinorrhea, sinus pressure, sneezing and sore throat.   Eyes: Negative for photophobia, pain, discharge, redness and itching.  Respiratory: Negative for cough, shortness of breath, wheezing and stridor.   Cardiovascular: Negative for palpitations.  Gastrointestinal: Negative for abdominal pain, blood in stool, constipation, diarrhea, nausea and vomiting.  Endocrine: Positive for heat intolerance. Negative for cold intolerance,  polydipsia, polyphagia and polyuria.  Genitourinary: Negative for dysuria, flank pain, frequency, hematuria, menstrual problem, pelvic pain, urgency, vaginal bleeding and vaginal discharge.  Musculoskeletal: Negative for arthralgias, back pain and myalgias.  Skin: Negative for rash.  Allergic/Immunologic: Negative for environmental allergies and food allergies.  Neurological: Negative for dizziness, tremors, weakness, light-headedness, numbness and headaches.  Hematological: Negative for adenopathy. Does not bruise/bleed easily.  Psychiatric/Behavioral: Negative for dysphoric mood. The patient is not nervous/anxious.     Patient Active Problem List   Diagnosis Date Noted  . Hypokalemia 08/20/2018  . 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 Goals of care, counseling/discussion 01/26/2018  . Small bowel tumor   . Pancreatic cyst 01/15/2018  . Anxiety and depression 01/15/2018  . Pancreas cyst 01/15/2018  . Bowel obstruction ([place]) 11/29/2017  . Small bowel obstruction ([place])   . 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 Status post total knee replacement using cement, right 05/30/2017  . GERD (gastroesophageal reflux disease) 12/22/2015  . Adenomatous polyp 09/11/2014  . Bilateral cataracts 09/11/2014  . Gastric catarrh 09/11/2014  . Gastroduodenal ulcer 09/11/2014  . Hypothyroidism 09/11/2014  . Cardiac murmur 10/08/2013  . Essential (primary) hypertension 10/08/2013  . Combined fat and carbohydrate induced hyperlipemia 10/08/2013    Allergies  Allergen Reactions  . Adhesive [Tape] Dermatitis    Can not use plastic tape. Paper tape ok   . Penicillin G Itching    Has patient had a PCN reaction causing immediate rash, facial/tongue/throat swelling, SOB or lightheadedness with hypotension: No  Has patient had a PCN reaction causing severe rash involving mucus membranes or skin necrosis: No Has patient had a PCN reaction that required  hospitalization: No Has patient had a PCN reaction occurring within the last 10 years: No If all of the above answers are "NO", then may proceed with Cephalosporin use.   Marland Kitchen Hydrocodone-Acetaminophen Itching and Rash    Past Surgical History:  Procedure Laterality Date  . BOWEL RESECTION  01/17/2018   Procedure: SMALL BOWEL RESECTION;  Surgeon: Jules Husbands, MD;  Location: [facility];  Service: General;;  . BREAST SURGERY Bilateral 1991   mastectomy  . CATARACT EXTRACTION Bilateral   . CHOLECYSTECTOMY    . COLONOSCOPY  2013   Dr Gustavo Lah  . HALLUX VALGUS AUSTIN Right 06/03/2015   Procedure: HALLUX VALGUS AUSTIN;  Surgeon: Samara Deist, DPM;  Location: [place];  Service: Podiatry;  Laterality: Right;  WITH POPLITEAL  . KNEE ARTHROSCOPY WITH MEDIAL MENISECTOMY Right 08/30/2016   Procedure: KNEE ARTHROSCOPY WITH PARTIAL MEDIAL MENISECTOMY and Partial Lateral Menisectomy;  Surgeon: Hessie Knows, MD;  Location: [facility];  Service: Orthopedics;  Laterality: Right;  . KNEE ARTHROSCOPY WITH SUBCHONDROPLASTY Right 08/30/2016   Procedure: KNEE ARTHROSCOPY WITH SUBCHONDROPLASTY;  Surgeon: Hessie Knows, MD;  Location: [facility];  Service: Orthopedics;  Laterality: Right;  . LAPAROTOMY N/A 01/17/2018   Procedure: EXPLORATORY LAPAROTOMY;  Surgeon: Jules Husbands, MD;  Location: [facility];  Service: General;  Laterality: N/A;  . MASTECTOMY Bilateral   . ROTATOR CUFF REPAIR Bilateral   . TONSILLECTOMY    . TOTAL KNEE ARTHROPLASTY Left   . TOTAL KNEE ARTHROPLASTY Right 05/30/2017   Procedure: TOTAL KNEE ARTHROPLASTY;  Surgeon: Hessie Knows, MD;  Location: [facility];  Service: Orthopedics;  Laterality: Right;  Marland Kitchen VAGINAL HYSTERECTOMY      Social History   Tobacco Use  . Smoking status: Former Smoker    Packs/day: 0.25    Years: 10.00    Pack years: 2.50    Types: Cigarettes    Quit date: 1991    Years since quitting: 30.4  . Smokeless tobacco: Never Used  . Tobacco comment: smoking  cessation materials not required  Vaping Use  . Vaping Use: Never used  Substance Use Topics  . Alcohol use: No    Alcohol/week: 0.0 standard drinks  . Drug use: No     Medication list has been reviewed and updated.  Current Meds  Medication Sig  . acetaminophen (TYLENOL) 325 MG tablet Take 1-2 tablets (325-650 mg total) by mouth every 6 (six) hours as needed for mild pain (pain score 1-3 or temp > 100.5).  Marland Kitchen amLODipine (NORVASC) 5 MG tablet Take 1 tablet (5 mg total) by mouth daily.  . busPIRone (BUSPAR) 7.5 MG tablet Take 1 tablet (7.5 mg total) by mouth 2 (two) times daily.  . hydrochlorothiazide (HYDRODIURIL) 12.5 MG tablet Take 1 tablet (12.5 mg total) by mouth daily.  Marland Kitchen omeprazole (PRILOSEC) 20 MG capsule Take 1 capsule (20 mg total) by mouth daily.    PHQ 2/9 Scores 09/03/2019 07/04/2019 03/01/2019 09/25/2018  PHQ - 2 Score 0 0 1 0  PHQ- 9 Score 5 0 4 0    GAD 7 : Generalized Anxiety Score 09/03/2019 07/04/2019 03/01/2019  Nervous, Anxious, on Edge 0 0 0  Control/stop worrying 0 0 0  Worry too much - different things 0 0 0  Trouble relaxing 0 0 0  Restless 0 0 0  Easily annoyed or irritable 0 0 0  Afraid - awful might happen 0 0 0  Total GAD 7 Score 0 0 0  Anxiety Difficulty - Not difficult at all -    BP Readings from Last 3 Encounters:  09/03/19 138/70  07/04/19 130/80  03/06/19 (!) 145/89    Physical Exam Vitals and nursing note reviewed.  Constitutional:      Appearance: She is well-developed.  HENT:     Head: Normocephalic.     Right Ear: Tympanic membrane, ear canal and external ear normal.     Left Ear: Tympanic membrane, ear canal and external ear normal.     Nose: Nose normal.  Eyes:     General: Lids are everted, no foreign bodies appreciated. No scleral icterus.       Left eye: No foreign body or hordeolum.     Conjunctiva/sclera: Conjunctivae normal.     Right eye: Right conjunctiva is not injected.     Left eye: Left conjunctiva is not  injected.     Pupils: Pupils are equal, round, and reactive to light.  Neck:     Thyroid: No thyromegaly.     Vascular: No JVD.     Trachea: No tracheal deviation.  Cardiovascular:     Rate and Rhythm: Normal rate and regular rhythm.     Heart sounds: Normal heart sounds. No murmur heard.  No friction rub. No gallop.   Pulmonary:     Effort: Pulmonary effort is normal. No respiratory distress.     Breath sounds: Normal breath sounds. No wheezing, rhonchi or rales.  Abdominal:     General: Bowel sounds are normal.     Palpations: Abdomen is soft. There is no mass.     Tenderness: There is no abdominal tenderness. There is no guarding or rebound.  Musculoskeletal:        General: No tenderness. Normal range of motion.     Cervical back: Normal range of motion and neck supple.  Lymphadenopathy:     Cervical: No cervical adenopathy.  Skin:    General: Skin is warm.     Capillary Refill: Capillary refill takes less than 2 seconds.     Coloration: Skin is not jaundiced or pale.     Findings: No bruising, erythema, lesion or rash.  Neurological:     Mental Status: She is alert and oriented to person, place, and time.     Cranial Nerves: No cranial nerve deficit.     Deep Tendon Reflexes: Reflexes normal.  Psychiatric:        Mood and Affect: Mood is not anxious or depressed.     Wt Readings from Last 3 Encounters:  09/03/19 198 lb (89.8 kg)  07/04/19 193 lb (87.5 kg)  03/06/19 180 lb (81.6 kg)    BP 138/70   Pulse 76   Ht 5\' 5"  (1.651 m)   Wt 198 lb (89.8 kg)   BMI 32.95 kg/m   Assessment and Plan: 1. Hypothyroidism due to acquired atrophy of thyroid Chronic.  Uncontrolled.  Patient is not taking medication due to it upsetting her stomach.  Currently having issues with fatigue and hot flashes.  We will proceed with a TSH and if elevated may be take her levothyroxine with pectin/applesauce. - TSH

## 2019-09-04 ENCOUNTER — Other Ambulatory Visit: Payer: Self-pay

## 2019-09-04 ENCOUNTER — Ambulatory Visit: Payer: Self-pay

## 2019-09-04 LAB — TSH: TSH: 4.84 u[IU]/mL — ABNORMAL HIGH (ref 0.450–4.500)

## 2019-09-04 MED ORDER — LEVOTHYROXINE SODIUM 25 MCG PO TABS: 25.0000 ug | ORAL_TABLET | Freq: Every day | ORAL | 1 refills | Status: DC

## 2019-09-04 MED ORDER — POTASSIUM CHLORIDE CRYS ER 20 MEQ PO TBCR: 20.0000 meq | EXTENDED_RELEASE_TABLET | Freq: Every day | ORAL | 0 refills | Status: DC

## 2019-09-04 NOTE — Telephone Encounter (Signed)
Patient called, left VM on home phone to return the call to the office. Cell mailbox is full.

## 2019-09-04 NOTE — Telephone Encounter (Signed)
Patient called and says she had a tick bite to the inside of her right ankle that was removed on 08/25/19. She says she saw Dr. Ronnald Ramp yesterday and didn't think about it until she woke up this morning and saw the redness to the area. She says the area is red, oblong shaped, about the size of the first digit of her index finger to the tip. She says there is no soreness when touched, no swelling, but there is a hard knot felt inside of where the tick was attached. I asked has she applied any antibiotic ointment after the bite, she denies. I advised I'm not sure if she will need to come back in since was seen yesterday, however this is a new issue. She says just discuss with Dr. Ronnald Ramp and give her a call back and she will do whatever Dr. Ronnald Ramp wants her to do. I advised this will be sent to Dr. Ronnald Ramp and she will receive a call back.   Answer Assessment - Initial Assessment Questions 1. TYPE of TICK: "Is it a wood tick or a deer tick?" If unsure, ask: "What size was the tick?" "Did it look more like a watermelon seed or a poppy seed?"      Small tick 2. LOCATION: "Where is the tick bite located?"      Right ankle; it's an oblong area from the tip of my index finger to the first joint 3. ONSET: "How long do you think the tick was attached before you removed it?" (Hours or days)      I don't know; removed 1 week ago on 08/25/19 4. TETANUS: "When was the last tetanus booster?"      Unsure 5. PREGNANCY: "Is there any chance you are pregnant?" "When was your last menstrual period?"     No  Protocols used: TICK BITE-A-AH

## 2019-09-04 NOTE — Telephone Encounter (Signed)
Patient called and she says she's not having fever, chills or nausea. She says her foot is swollen and some of the swelling is related to the tick bite. Appointment scheduled for tomorrow at 0800 with Dr. Ronnald Ramp.

## 2019-09-04 NOTE — Telephone Encounter (Signed)
Called pt Left VM to call back.    Calling to see if she was having any fever, chills, or nausea. If pt is having symptoms she need to be seen. If not give [place] a call to schedule an appt if she starts having any symptoms.   Will route result note to [facility] Nurse Triage for follow up when patient returns call to clinic.     KP

## 2019-09-05 ENCOUNTER — Encounter: Payer: Self-pay | Admitting: Family Medicine

## 2019-09-05 ENCOUNTER — Other Ambulatory Visit: Payer: Self-pay

## 2019-09-05 ENCOUNTER — Ambulatory Visit (INDEPENDENT_AMBULATORY_CARE_PROVIDER_SITE_OTHER): Payer: Medicare HMO | Admitting: Family Medicine

## 2019-09-05 VITALS — BP 146/70 | HR 68 | Ht 65.0 in | Wt 194.0 lb

## 2019-09-05 DIAGNOSIS — L03115 Cellulitis of right lower limb: Secondary | ICD-10-CM

## 2019-09-05 MED ORDER — DOXYCYCLINE HYCLATE 100 MG PO TABS: 100.0000 mg | ORAL_TABLET | Freq: Two times a day (BID) | ORAL | 0 refills | Status: DC

## 2019-09-05 NOTE — Progress Notes (Signed)
Date:  09/05/2019   Name:  Kaitlyn Oliver   DOB:  27-Feb-1934   MRN:  [id number]   Chief Complaint: Tick Removal (X1-2 weeks, right foot (back), swollen, red, not painful, in the yard)  Rash This is a new problem. The current episode started yesterday. The problem has been gradually worsening since onset. The affected locations include the right ankle and right foot. The rash is characterized by redness. She was exposed to an insect bite/sting (tick bite). Pertinent negatives include no congestion, cough, diarrhea, eye pain, fatigue, fever, rhinorrhea, shortness of breath, sore throat or vomiting.    Lab Results  Component Value Date   CREATININE 0.88 05/15/2019   BUN 13 05/15/2019   NA 134 (L) 05/15/2019   K 3.4 (L) 05/15/2019   CL 98 05/15/2019   CO2 25 05/15/2019   Lab Results  Component Value Date   CHOL 227 (H) 02/15/2017   HDL 33 (L) 02/15/2017   LDLCALC Comment 02/15/2017   TRIG 534 (H) 02/15/2017   CHOLHDL 6.9 (H) 02/15/2017   Lab Results  Component Value Date   TSH 4.840 (H) 09/03/2019   No results found for: HGBA1C Lab Results  Component Value Date   WBC 5.0 03/06/2019   HGB 12.9 03/06/2019   HCT 37.9 03/06/2019   MCV 81.2 03/06/2019   PLT 129 (L) 03/06/2019   Lab Results  Component Value Date   ALT 46 (H) 05/15/2019   AST 56 (H) 05/15/2019   ALKPHOS 64 05/15/2019   BILITOT 1.0 05/15/2019     Review of Systems  Constitutional: Negative.  Negative for chills, fatigue, fever and unexpected weight change.  HENT: Negative for congestion, ear discharge, ear pain, postnasal drip, rhinorrhea, sinus pressure, sneezing and sore throat.   Eyes: Negative for photophobia, pain, discharge, redness and itching.  Respiratory: Negative for cough, shortness of breath, wheezing and stridor.   Gastrointestinal: Negative for abdominal pain, blood in stool, constipation, diarrhea, nausea and vomiting.  Endocrine: Negative for cold intolerance, heat intolerance,  polydipsia, polyphagia and polyuria.  Genitourinary: Negative for dysuria, flank pain, frequency, hematuria, menstrual problem, pelvic pain, urgency, vaginal bleeding and vaginal discharge.  Musculoskeletal: Negative for arthralgias, back pain and myalgias.  Skin: Positive for rash.  Allergic/Immunologic: Negative for environmental allergies and food allergies.  Neurological: Negative for dizziness, weakness, light-headedness, numbness and headaches.  Hematological: Negative for adenopathy. Does not bruise/bleed easily.  Psychiatric/Behavioral: Negative for dysphoric mood. The patient is not nervous/anxious.     Patient Active Problem List   Diagnosis Date Noted  . Hypokalemia 08/20/2018  . 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 Goals of care, counseling/discussion 01/26/2018  . Small bowel tumor   . Pancreatic cyst 01/15/2018  . Anxiety and depression 01/15/2018  . Pancreas cyst 01/15/2018  . Bowel obstruction ([place]) 11/29/2017  . Small bowel obstruction ([place])   . Thoracoabdominal aortic aneurysm (TAAA) ([place]) 11/19/2017  . Status post total knee replacement using cement, right 05/30/2017  . GERD (gastroesophageal reflux disease) 12/22/2015  . Adenomatous polyp 09/11/2014  . Bilateral cataracts 09/11/2014  . Gastric catarrh 09/11/2014  . Gastroduodenal ulcer 09/11/2014  . Hypothyroidism 09/11/2014  . Cardiac murmur 10/08/2013  . Essential (primary) hypertension 10/08/2013  . Combined fat and carbohydrate induced hyperlipemia 10/08/2013    Allergies  Allergen Reactions  . Adhesive [Tape] Dermatitis    Can not use plastic tape. Paper tape ok   . Penicillin G Itching    Has patient had a PCN  reaction causing immediate rash, facial/tongue/throat swelling, SOB or lightheadedness with hypotension: No Has patient had a PCN reaction causing severe rash involving mucus membranes or skin necrosis: No Has patient had a PCN reaction that required hospitalization:  No Has patient had a PCN reaction occurring within the last 10 years: No If all of the above answers are "NO", then may proceed with Cephalosporin use.   Marland Kitchen Hydrocodone-Acetaminophen Itching and Rash    Past Surgical History:  Procedure Laterality Date  . BOWEL RESECTION  01/17/2018   Procedure: SMALL BOWEL RESECTION;  Surgeon: Jules Husbands, MD;  Location: [facility];  Service: General;;  . BREAST SURGERY Bilateral 1991   mastectomy  . CATARACT EXTRACTION Bilateral   . CHOLECYSTECTOMY    . COLONOSCOPY  2013   Dr Gustavo Lah  . HALLUX VALGUS AUSTIN Right 06/03/2015   Procedure: HALLUX VALGUS AUSTIN;  Surgeon: Samara Deist, DPM;  Location: [place];  Service: Podiatry;  Laterality: Right;  WITH POPLITEAL  . KNEE ARTHROSCOPY WITH MEDIAL MENISECTOMY Right 08/30/2016   Procedure: KNEE ARTHROSCOPY WITH PARTIAL MEDIAL MENISECTOMY and Partial Lateral Menisectomy;  Surgeon: Hessie Knows, MD;  Location: [facility];  Service: Orthopedics;  Laterality: Right;  . KNEE ARTHROSCOPY WITH SUBCHONDROPLASTY Right 08/30/2016   Procedure: KNEE ARTHROSCOPY WITH SUBCHONDROPLASTY;  Surgeon: Hessie Knows, MD;  Location: [facility];  Service: Orthopedics;  Laterality: Right;  . LAPAROTOMY N/A 01/17/2018   Procedure: EXPLORATORY LAPAROTOMY;  Surgeon: Jules Husbands, MD;  Location: [facility];  Service: General;  Laterality: N/A;  . MASTECTOMY Bilateral   . ROTATOR CUFF REPAIR Bilateral   . TONSILLECTOMY    . TOTAL KNEE ARTHROPLASTY Left   . TOTAL KNEE ARTHROPLASTY Right 05/30/2017   Procedure: TOTAL KNEE ARTHROPLASTY;  Surgeon: Hessie Knows, MD;  Location: [facility];  Service: Orthopedics;  Laterality: Right;  Marland Kitchen VAGINAL HYSTERECTOMY      Social History   Tobacco Use  . Smoking status: Former Smoker    Packs/day: 0.25    Years: 10.00    Pack years: 2.50    Types: Cigarettes    Quit date: 1991    Years since quitting: 30.4  . Smokeless tobacco: Never Used  . Tobacco comment: smoking cessation materials  not required  Vaping Use  . Vaping Use: Never used  Substance Use Topics  . Alcohol use: No    Alcohol/week: 0.0 standard drinks  . Drug use: No     Medication list has been reviewed and updated.  Current Meds  Medication Sig  . acetaminophen (TYLENOL) 325 MG tablet Take 1-2 tablets (325-650 mg total) by mouth every 6 (six) hours as needed for mild pain (pain score 1-3 or temp > 100.5).  Marland Kitchen amLODipine (NORVASC) 5 MG tablet Take 1 tablet (5 mg total) by mouth daily. (Patient taking differently: Take 5 mg by mouth daily. )  . busPIRone (BUSPAR) 7.5 MG tablet Take 1 tablet (7.5 mg total) by mouth 2 (two) times daily.  Marland Kitchen levothyroxine (SYNTHROID) 25 MCG tablet Take 1 tablet (25 mcg total) by mouth daily before breakfast.  . omeprazole (PRILOSEC) 20 MG capsule Take 1 capsule (20 mg total) by mouth daily.  . potassium chloride SA (KLOR-CON) 20 MEQ tablet Take 1 tablet (20 mEq total) by mouth daily for 10 days.    PHQ 2/9 Scores 09/05/2019 09/03/2019 07/04/2019 03/01/2019  PHQ - 2 Score 1 0 0 1  PHQ- 9 Score 1 5 0 4    GAD 7 : Generalized Anxiety Score 09/05/2019  09/03/2019 07/04/2019 03/01/2019  Nervous, Anxious, on Edge 0 0 0 0  Control/stop worrying 0 0 0 0  Worry too much - different things 0 0 0 0  Trouble relaxing 0 0 0 0  Restless 0 0 0 0  Easily annoyed or irritable 2 0 0 0  Afraid - awful might happen 0 0 0 0  Total GAD 7 Score 2 0 0 0  Anxiety Difficulty Not difficult at all - Not difficult at all -    BP Readings from Last 3 Encounters:  09/05/19 (!) 146/70  09/03/19 138/70  07/04/19 130/80    Physical Exam Vitals reviewed.  Constitutional:      Appearance: She is well-developed.  HENT:     Head: Normocephalic.     Right Ear: Tympanic membrane, ear canal and external ear normal.     Left Ear: Tympanic membrane, ear canal and external ear normal.     Nose: Nose normal.  Eyes:     General: Lids are everted, no foreign bodies appreciated. No scleral icterus.        Left eye: No foreign body or hordeolum.     Conjunctiva/sclera: Conjunctivae normal.     Right eye: Right conjunctiva is not injected.     Left eye: Left conjunctiva is not injected.     Pupils: Pupils are equal, round, and reactive to light.  Neck:     Thyroid: No thyromegaly.     Vascular: No JVD.     Trachea: No tracheal deviation.  Cardiovascular:     Rate and Rhythm: Normal rate and regular rhythm.     Heart sounds: Normal heart sounds. No murmur heard.  No friction rub. No gallop.   Pulmonary:     Effort: Pulmonary effort is normal. No respiratory distress.     Breath sounds: Normal breath sounds. No wheezing, rhonchi or rales.  Abdominal:     General: Bowel sounds are normal.     Palpations: Abdomen is soft. There is no mass.     Tenderness: There is no abdominal tenderness. There is no guarding or rebound.  Musculoskeletal:        General: No tenderness. Normal range of motion.     Cervical back: Normal range of motion and neck supple.  Lymphadenopathy:     Cervical: No cervical adenopathy.  Skin:    General: Skin is warm.     Findings: Erythema present. No rash.  Neurological:     Mental Status: She is alert and oriented to person, place, and time.     Cranial Nerves: No cranial nerve deficit.     Deep Tendon Reflexes: Reflexes normal.  Psychiatric:        Mood and Affect: Mood is not anxious or depressed.     Wt Readings from Last 3 Encounters:  09/05/19 194 lb (88 kg)  09/03/19 198 lb (89.8 kg)  07/04/19 193 lb (87.5 kg)    BP (!) 146/70   Pulse 68   Ht 5\' 5"  (1.651 m)   Wt 194 lb (88 kg)   SpO2 97%   BMI 32.28 kg/m   Assessment and Plan: 1. Cellulitis of right lower extremity Acute.  Persistent.  Relatively stable with no fever or localized pain.  Tick bite of presumably was described as a deer tick is gradually healing.  There is mild erythema of the right foot and ankle.  We will treat with doxycycline 100 mg twice a day. - doxycycline (VIBRA-TABS)  100 MG tablet;  Take 1 tablet (100 mg total) by mouth 2 (two) times daily.  Dispense: 20 tablet; Refill: 0

## 2019-09-10 ENCOUNTER — Other Ambulatory Visit: Payer: Self-pay

## 2019-09-10 ENCOUNTER — Telehealth: Payer: Self-pay | Admitting: Family Medicine

## 2019-09-10 DIAGNOSIS — E034 Atrophy of thyroid (acquired): Secondary | ICD-10-CM

## 2019-09-10 DIAGNOSIS — H353131 Nonexudative age-related macular degeneration, bilateral, early dry stage: Secondary | ICD-10-CM | POA: Diagnosis not present

## 2019-09-10 DIAGNOSIS — Z961 Presence of intraocular lens: Secondary | ICD-10-CM | POA: Diagnosis not present

## 2019-09-10 NOTE — Progress Notes (Signed)
Ref place for endo for thyroid per pt

## 2019-09-10 NOTE — Telephone Encounter (Signed)
Put referral in per pt request

## 2019-09-10 NOTE — Telephone Encounter (Signed)
Copied from [place] [id number]. Topic: General - Other >> Sep 10, 2019  9:58 AM Antonieta Iba C wrote: Reason for CRM: pt called in to speak with CMA. Pt says that she would like to have a second opinion about her Thyroid. Pt would like to have referral to provider Dolores Frame connell at [facility].   Please assist.

## 2019-10-02 ENCOUNTER — Other Ambulatory Visit: Payer: Self-pay

## 2019-10-02 ENCOUNTER — Other Ambulatory Visit: Payer: Medicare HMO

## 2019-10-02 DIAGNOSIS — I10 Essential (primary) hypertension: Secondary | ICD-10-CM

## 2019-10-02 DIAGNOSIS — E034 Atrophy of thyroid (acquired): Secondary | ICD-10-CM | POA: Diagnosis not present

## 2019-10-02 MED ORDER — HYDROCHLOROTHIAZIDE 12.5 MG PO CAPS: 12.5000 mg | ORAL_CAPSULE | Freq: Every day | ORAL | 1 refills | Status: DC

## 2019-10-03 ENCOUNTER — Other Ambulatory Visit: Payer: Self-pay

## 2019-10-03 LAB — THYROID PANEL WITH TSH
Free Thyroxine Index: 1.7 (ref 1.2–4.9)
T3 Uptake Ratio: 24 % (ref 24–39)
T4, Total: 7.2 ug/dL (ref 4.5–12.0)
TSH: 3.24 u[IU]/mL (ref 0.450–4.500)

## 2019-10-03 MED ORDER — LEVOTHYROXINE SODIUM 25 MCG PO TABS: 25.0000 ug | ORAL_TABLET | Freq: Every day | ORAL | 5 refills | Status: DC

## 2019-10-16 ENCOUNTER — Other Ambulatory Visit: Payer: Self-pay

## 2019-10-17 ENCOUNTER — Other Ambulatory Visit: Payer: Self-pay

## 2019-10-17 DIAGNOSIS — E034 Atrophy of thyroid (acquired): Secondary | ICD-10-CM

## 2019-10-18 LAB — THYROID PANEL WITH TSH
Free Thyroxine Index: 1.6 (ref 1.2–4.9)
T3 Uptake Ratio: 24 % (ref 24–39)
T4, Total: 6.7 ug/dL (ref 4.5–12.0)
TSH: 4.6 u[IU]/mL — ABNORMAL HIGH (ref 0.450–4.500)

## 2019-10-28 DIAGNOSIS — E039 Hypothyroidism, unspecified: Secondary | ICD-10-CM | POA: Diagnosis not present

## 2019-11-22 ENCOUNTER — Other Ambulatory Visit: Payer: Self-pay

## 2019-11-22 ENCOUNTER — Inpatient Hospital Stay: Payer: Medicare HMO | Admitting: Oncology

## 2019-11-22 ENCOUNTER — Encounter: Payer: Self-pay | Admitting: Oncology

## 2019-11-22 ENCOUNTER — Inpatient Hospital Stay: Payer: Medicare HMO | Attending: Oncology

## 2019-11-22 VITALS — BP 146/80 | HR 60 | Temp 97.8°F | Resp 16 | Ht 65.0 in | Wt 194.6 lb

## 2019-11-22 DIAGNOSIS — C7A8 Other malignant neuroendocrine tumors: Secondary | ICD-10-CM

## 2019-11-22 LAB — CBC WITH DIFFERENTIAL/PLATELET
Abs Immature Granulocytes: 0.01 10*3/uL (ref 0.00–0.07)
Basophils Absolute: 0 10*3/uL (ref 0.0–0.1)
Basophils Relative: 1 %
Eosinophils Absolute: 0.1 10*3/uL (ref 0.0–0.5)
Eosinophils Relative: 2 %
HCT: 37.7 % (ref 36.0–46.0)
Hemoglobin: 12.6 g/dL (ref 12.0–15.0)
Immature Granulocytes: 0 %
Lymphocytes Relative: 35 %
Lymphs Abs: 1.8 10*3/uL (ref 0.7–4.0)
MCH: 29.4 pg (ref 26.0–34.0)
MCHC: 33.4 g/dL (ref 30.0–36.0)
MCV: 87.9 fL (ref 80.0–100.0)
Monocytes Absolute: 0.4 10*3/uL (ref 0.1–1.0)
Monocytes Relative: 8 %
Neutro Abs: 2.8 10*3/uL (ref 1.7–7.7)
Neutrophils Relative %: 54 %
Platelets: 150 10*3/uL (ref 150–400)
RBC: 4.29 MIL/uL (ref 3.87–5.11)
RDW: 15.6 % — ABNORMAL HIGH (ref 11.5–15.5)
WBC: 5.2 10*3/uL (ref 4.0–10.5)
nRBC: 0 % (ref 0.0–0.2)

## 2019-11-22 LAB — COMPREHENSIVE METABOLIC PANEL
ALT: 43 U/L (ref 0–44)
AST: 57 U/L — ABNORMAL HIGH (ref 15–41)
Albumin: 4.3 g/dL (ref 3.5–5.0)
Alkaline Phosphatase: 64 U/L (ref 38–126)
Anion gap: 11 (ref 5–15)
BUN: 16 mg/dL (ref 8–23)
CO2: 25 mmol/L (ref 22–32)
Calcium: 9.6 mg/dL (ref 8.9–10.3)
Chloride: 102 mmol/L (ref 98–111)
Creatinine, Ser: 0.94 mg/dL (ref 0.44–1.00)
GFR calc Af Amer: >60 mL/min (ref >60)
GFR calc non Af Amer: 56 mL/min — ABNORMAL LOW (ref >60)
Glucose, Bld: 106 mg/dL — ABNORMAL HIGH (ref 70–99)
Potassium: 4 mmol/L (ref 3.5–5.1)
Sodium: 138 mmol/L (ref 135–145)
Total Bilirubin: 0.8 mg/dL (ref 0.3–1.2)
Total Protein: 7.6 g/dL (ref 6.5–8.1)

## 2019-11-22 NOTE — Progress Notes (Signed)
Pt has diarrhea and it is slowed down and has had it for years. She eats small amounts but frequent eating. It is not changed over lots of years. She has a dog that keeps her active walking him. She does feel a spot in her lower abdomen in the middle right above pubic area. She feels it is scar tissue from her surgery- it does not cause pain

## 2019-11-25 LAB — CHROMOGRANIN A: Chromogranin A (ng/mL): 538.8 ng/mL — ABNORMAL HIGH (ref 0.0–101.8)

## 2019-11-27 NOTE — Progress Notes (Signed)
Hematology/Oncology Consult note [facility]  Telephone:([phone] [phone]  Patient Care Team: Juline Patch, MD as PCP - General (Family Medicine)   Name of the patient: Kaitlyn Oliver  [id number]  [birth year]   Date of visit: 11/27/19  Diagnosis- Routine f/u of neuroendocrine tumor of small bowel  Chief complaint/ Reason for visit-routine follow-up of neuroendocrine tumor of small bowel s/p resection  Heme/Onc history: patient is a 86 year old female with a past medical history significant for hypertension GERD thoracic abdominal aneurysm anxiety and depression who presented to the ER with symptoms of epigastric pain and nausea. Patient also was admitted for an episode of bowel obstruction about 2 months ago which resolved conservatively. CT abdomen done during this admission showed an abnormal small bowel loop in the right lower quadrant and adjacent 4 cm necrotic mesenteric lesion which could be a potential lymph node. Patient also noted to have hypoattenuating cystic lesions in the head of the pancreas. Hepatic cirrhosis. This was followed by MRI of the abdomen with and without contrast which again showed multiple cystic lesions in the pancreas either pancreatic pseudocyst or cystic neoplasm of the pancreas.  Patient had exploratory laparotomy and resection of the small bowel tumor. Pathology showed well-differentiated neuroendocrine tumor grade 1, 3.7 cm mitotic rate less than 2 per 10 high-power fields. Ki-67 less than 3%. Tumor invades through the muscularis propria into subserosal tissue. All margins negative. L VIN PNI present. Large mesenteric mass greater than 2 cm present. Regional lymph nodes 5 out of 14 were involved. pT3N2. She is currently under surveillance   Interval history-patient is doing well for his age.  Reports occasional pain at the surgical site.  Appetite and weight have remained stable.  Denies any significant  diarrhea or palpitations  ECOG PS- 1 Pain scale- 0   Review of systems- Review of Systems  Constitutional: Positive for malaise/fatigue. Negative for chills, fever and weight loss.  HENT: Negative for congestion, ear discharge and nosebleeds.   Eyes: Negative for blurred vision.  Respiratory: Negative for cough, hemoptysis, sputum production, shortness of breath and wheezing.   Cardiovascular: Negative for chest pain, palpitations, orthopnea and claudication.  Gastrointestinal: Negative for abdominal pain, blood in stool, constipation, diarrhea, heartburn, melena, nausea and vomiting.  Genitourinary: Negative for dysuria, flank pain, frequency, hematuria and urgency.  Musculoskeletal: Negative for back pain, joint pain and myalgias.  Skin: Negative for rash.  Neurological: Negative for dizziness, tingling, focal weakness, seizures, weakness and headaches.  Endo/Heme/Allergies: Does not bruise/bleed easily.  Psychiatric/Behavioral: Negative for depression and suicidal ideas. The patient does not have insomnia.        Allergies  Allergen Reactions  . Adhesive [Tape] Dermatitis    Can not use plastic tape. Paper tape ok   . Penicillin G Itching    Has patient had a PCN reaction causing immediate rash, facial/tongue/throat swelling, SOB or lightheadedness with hypotension: No Has patient had a PCN reaction causing severe rash involving mucus membranes or skin necrosis: No Has patient had a PCN reaction that required hospitalization: No Has patient had a PCN reaction occurring within the last 10 years: No If all of the above answers are "NO", then may proceed with Cephalosporin use.   Marland Kitchen Hydrocodone-Acetaminophen Itching and Rash     Past Medical History:  Diagnosis Date  . Anxiety   . Arthritis    thumbs, hands  . Cancer [facility]) 1991   bilateral breast  . Depression   . GERD (gastroesophageal  reflux disease)   . Headache    occasional - pinched nerve in neck  . Hypertension    . Hypothyroidism   . Thyroid disease   . Wears dentures    full upper, partial lower     Past Surgical History:  Procedure Laterality Date  . BOWEL RESECTION  01/17/2018   Procedure: SMALL BOWEL RESECTION;  Surgeon: Leafy Ro, MD;  Location: [facility];  Service: General;;  . BREAST SURGERY Bilateral 1991   mastectomy  . CATARACT EXTRACTION Bilateral   . CHOLECYSTECTOMY    . COLONOSCOPY  2013   Dr Marva Panda  . HALLUX VALGUS AUSTIN Right 06/03/2015   Procedure: HALLUX VALGUS AUSTIN;  Surgeon: Gwyneth Revels, DPM;  [facility];  Service: Podiatry;  Laterality: Right;  WITH POPLITEAL  . KNEE ARTHROSCOPY WITH MEDIAL MENISECTOMY Right 08/30/2016   Procedure: KNEE ARTHROSCOPY WITH PARTIAL MEDIAL MENISECTOMY and Partial Lateral Menisectomy;  Surgeon: Kennedy Bucker, MD;  Location: [facility];  Service: Orthopedics;  Laterality: Right;  . KNEE ARTHROSCOPY WITH SUBCHONDROPLASTY Right 08/30/2016   Procedure: KNEE ARTHROSCOPY WITH SUBCHONDROPLASTY;  Surgeon: Kennedy Bucker, MD;  Location: [facility];  Service: Orthopedics;  Laterality: Right;  . LAPAROTOMY N/A 01/17/2018   Procedure: EXPLORATORY LAPAROTOMY;  Surgeon: Leafy Ro, MD;  Location: [facility];  Service: General;  Laterality: N/A;  . MASTECTOMY Bilateral   . ROTATOR CUFF REPAIR Bilateral   . TONSILLECTOMY    . TOTAL KNEE ARTHROPLASTY Left   . TOTAL KNEE ARTHROPLASTY Right 05/30/2017   Procedure: TOTAL KNEE ARTHROPLASTY;  Surgeon: Kennedy Bucker, MD;  Location: [facility];  Service: Orthopedics;  Laterality: Right;  Marland Kitchen VAGINAL HYSTERECTOMY      Social History   Socioeconomic History  . Marital status: Media planner    Spouse name: Not on file  . Number of children: 1  . Years of education: Not on file  . Highest education level: 12th grade  Occupational History  . Occupation: Retired  Tobacco Use  . Smoking status: Former Smoker    Packs/day: 0.25    Years: 10.00    Pack years: 2.50    Types: Cigarettes     Quit date: 1991    Years since quitting: 30.7  . Smokeless tobacco: Never Used  . Tobacco comment: smoking cessation materials not required  Vaping Use  . Vaping Use: Never used  Substance and Sexual Activity  . Alcohol use: No    Alcohol/week: 0.0 standard drinks  . Drug use: No  . Sexual activity: Not Currently  Other Topics Concern  . Not on file  Social History Narrative  . Not on file   Social Determinants of Health   Financial Resource Strain:   . Difficulty of Paying Living Expenses: Not on file  Food Insecurity:   . Worried About Programme researcher, broadcasting/film/video in the Last Year: Not on file  . Ran Out of Food in the Last Year: Not on file  Transportation Needs:   . Lack of Transportation (Medical): Not on file  . Lack of Transportation (Non-Medical): Not on file  Physical Activity:   . Days of Exercise per Week: Not on file  . Minutes of Exercise per Session: Not on file  Stress:   . Feeling of Stress : Not on file  Social Connections:   . Frequency of Communication with Friends and Family: Not on file  . Frequency of Social Gatherings with Friends and Family: Not on file  . Attends Religious Services: Not  on file  . Active Member of Clubs or Organizations: Not on file  . Attends Archivist Meetings: Not on file  . Marital Status: Not on file  Intimate Partner Violence:   . Fear of Current or Ex-Partner: Not on file  . Emotionally Abused: Not on file  . Physically Abused: Not on file  . Sexually Abused: Not on file    Family History  Problem Relation Age of Onset  . Pulmonary embolism Mother   . Healthy Father      Current Outpatient Medications:  .  acetaminophen (TYLENOL) 325 MG tablet, Take 1-2 tablets (325-650 mg total) by mouth every 6 (six) hours as needed for mild pain (pain score 1-3 or temp > 100.5)., Disp: , Rfl:  .  amLODipine (NORVASC) 5 MG tablet, Take 1 tablet (5 mg total) by mouth daily. (Patient taking differently: Take 5 mg by mouth daily.  ), Disp: 90 tablet, Rfl: 1 .  busPIRone (BUSPAR) 7.5 MG tablet, Take 1 tablet (7.5 mg total) by mouth 2 (two) times daily., Disp: 180 tablet, Rfl: 1 .  levothyroxine (SYNTHROID) 25 MCG tablet, Take 1 tablet (25 mcg total) by mouth daily before breakfast., Disp: 30 tablet, Rfl: 5 .  omeprazole (PRILOSEC) 20 MG capsule, Take 1 capsule (20 mg total) by mouth daily., Disp: 90 capsule, Rfl: 1  Physical exam:  Vitals:   11/22/19 1137  BP: (!) 146/80  Pulse: 60  Resp: 16  Temp: 97.8 F (36.6 C)  TempSrc: Oral  Weight: 194 lb 9.6 oz (88.3 kg)  Height: $Remove'5\' 5"'BLbbDGv$  (1.651 m)   Physical Exam Constitutional:      General: She is not in acute distress. Cardiovascular:     Rate and Rhythm: Normal rate and regular rhythm.     Heart sounds: Normal heart sounds.  Pulmonary:     Effort: Pulmonary effort is normal.     Breath sounds: Normal breath sounds.  Abdominal:     General: Bowel sounds are normal. There is no distension.     Palpations: Abdomen is soft.     Tenderness: There is no abdominal tenderness.  Skin:    General: Skin is warm and dry.  Neurological:     Mental Status: She is alert and oriented to person, place, and time.      CMP Latest Ref Rng & Units 11/22/2019  Glucose 70 - 99 mg/dL 106(H)  BUN 8 - 23 mg/dL 16  Creatinine 0.44 - 1.00 mg/dL 0.94  Sodium 135 - 145 mmol/L 138  Potassium 3.5 - 5.1 mmol/L 4.0  Chloride 98 - 111 mmol/L 102  CO2 22 - 32 mmol/L 25  Calcium 8.9 - 10.3 mg/dL 9.6  Total Protein 6.5 - 8.1 g/dL 7.6  Total Bilirubin 0.3 - 1.2 mg/dL 0.8  Alkaline Phos 38 - 126 U/L 64  AST 15 - 41 U/L 57(H)  ALT 0 - 44 U/L 43   CBC Latest Ref Rng & Units 11/22/2019  WBC 4.0 - 10.5 K/uL 5.2  Hemoglobin 12.0 - 15.0 g/dL 12.6  Hematocrit 36 - 46 % 37.7  Platelets 150 - 400 K/uL 150      Assessment and plan- Patient is a 86 y.o. 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  This is a routine follow-up  visit.  Clinically patient is doing well with no concerning signs and symptoms of recurrence.  Her last scan was done in March 2021 which did  not show any evidence of progressive disease.Given her age I am planning to get a scan once a year and will plan to get a repeat scan in March 2022.  She does have waxing and waning chromogranin A levels which have remained elevated despite resection.  Even back in March 2021 when there was no evidence of recurrent disease her level was 365 and prior to that 625.  Today 538.  Continue to monitor   Visit Diagnosis 1. 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 Randa Evens, MD, MPH [facility] at [facility] [phone] 11/27/2019 11:44 AM

## 2019-11-28 ENCOUNTER — Other Ambulatory Visit: Payer: Medicare HMO

## 2019-11-28 ENCOUNTER — Ambulatory Visit: Payer: Medicare HMO | Admitting: Oncology

## 2019-12-02 ENCOUNTER — Ambulatory Visit (INDEPENDENT_AMBULATORY_CARE_PROVIDER_SITE_OTHER): Payer: Medicare HMO

## 2019-12-02 DIAGNOSIS — Z0000 Encounter for general adult medical examination without abnormal findings: Secondary | ICD-10-CM | POA: Diagnosis not present

## 2019-12-02 NOTE — Progress Notes (Signed)
Subjective:   Kaitlyn Oliver is a 86 y.o. female who presents for Medicare Annual (Subsequent) preventive examination.  Virtual Visit via Telephone Note  I connected with  Kaitlyn Oliver on 12/02/19 at  8:00 AM EDT by telephone and verified that I am speaking with the correct person using two identifiers.  Medicare Annual Wellness visit completed telephonically due to Covid-19 pandemic.   Location: Patient: home Provider: [facility]   I discussed the limitations, risks, security and privacy concerns of performing an evaluation and management service by telephone and the availability of in person appointments. The patient expressed understanding and agreed to proceed.  Unable to perform video visit due to video visit attempted and failed and/or patient does not have video capability.   Some vital signs may be absent or patient reported.   Clemetine Marker, LPN    Review of Systems     Cardiac Risk Factors include: advanced age (>82men, >100 women);dyslipidemia;hypertension;obesity (BMI >30kg/m2)     Objective:    There were no vitals filed for this visit. There is no height or weight on file to calculate BMI.  Advanced Directives 11/22/2019 05/27/2019 03/06/2019 03/06/2019 11/30/2018 02/16/2018 01/26/2018  Does Patient Have a Medical Advance Directive? Yes No No No Yes Yes Yes  Type of Paramedic of [place];Living will [place];Living will - - [place];Living will [place];Living will [place];Living will  Does patient want to make changes to medical advance directive? - No - Patient declined - - No - Patient declined - No - Patient declined  Copy of [place] in Chart? No - copy requested No - copy requested - - No - copy requested No - copy requested No - copy requested  Would patient like information on creating a medical advance directive? No - Patient  declined - No - Patient declined - - - No - Patient declined    Current Medications (verified) Outpatient Encounter Medications as of 12/02/2019  Medication Sig  . acetaminophen (TYLENOL) 325 MG tablet Take 1-2 tablets (325-650 mg total) by mouth every 6 (six) hours as needed for mild pain (pain score 1-3 or temp > 100.5).  Marland Kitchen amLODipine (NORVASC) 5 MG tablet Take 1 tablet (5 mg total) by mouth daily. (Patient taking differently: Take 5 mg by mouth daily. )  . busPIRone (BUSPAR) 7.5 MG tablet Take 1 tablet (7.5 mg total) by mouth 2 (two) times daily.  Arna Medici 50 MCG tablet Take 50 mcg by mouth daily.  Marland Kitchen omeprazole (PRILOSEC) 20 MG capsule Take 1 capsule (20 mg total) by mouth daily.  . [DISCONTINUED] levothyroxine (SYNTHROID) 25 MCG tablet Take 1 tablet (25 mcg total) by mouth daily before breakfast.   No facility-administered encounter medications on file as of 12/02/2019.    Allergies (verified) Adhesive [tape], Penicillin g, and Hydrocodone-acetaminophen   History: Past Medical History:  Diagnosis Date  . Anxiety   . Arthritis    thumbs, hands  . Cancer [facility]) 1991   bilateral breast  . Depression   . GERD (gastroesophageal reflux disease)   . Headache    occasional - pinched nerve in neck  . Hypertension   . Hypothyroidism   . Thyroid disease   . Wears dentures    full upper, partial lower   Past Surgical History:  Procedure Laterality Date  . BOWEL RESECTION  01/17/2018   Procedure: SMALL BOWEL RESECTION;  Surgeon: Jules Husbands, MD;  Location: [facility];  Service: General;;  . BREAST SURGERY Bilateral 1991   mastectomy  . CATARACT EXTRACTION Bilateral   . CHOLECYSTECTOMY    . COLONOSCOPY  2013   Dr Gustavo Lah  . HALLUX VALGUS AUSTIN Right 06/03/2015   Procedure: HALLUX VALGUS AUSTIN;  Surgeon: Samara Deist, DPM;  Location: [facility];  Service: Podiatry;  Laterality: Right;  WITH POPLITEAL  . KNEE ARTHROSCOPY WITH MEDIAL MENISECTOMY Right 08/30/2016    Procedure: KNEE ARTHROSCOPY WITH PARTIAL MEDIAL MENISECTOMY and Partial Lateral Menisectomy;  Surgeon: Hessie Knows, MD;  Location: [facility];  Service: Orthopedics;  Laterality: Right;  . KNEE ARTHROSCOPY WITH SUBCHONDROPLASTY Right 08/30/2016   Procedure: KNEE ARTHROSCOPY WITH SUBCHONDROPLASTY;  Surgeon: Hessie Knows, MD;  Location: [facility];  Service: Orthopedics;  Laterality: Right;  . LAPAROTOMY N/A 01/17/2018   Procedure: EXPLORATORY LAPAROTOMY;  Surgeon: Jules Husbands, MD;  Location: [facility];  Service: General;  Laterality: N/A;  . MASTECTOMY Bilateral   . ROTATOR CUFF REPAIR Bilateral   . TONSILLECTOMY    . TOTAL KNEE ARTHROPLASTY Left   . TOTAL KNEE ARTHROPLASTY Right 05/30/2017   Procedure: TOTAL KNEE ARTHROPLASTY;  Surgeon: Hessie Knows, MD;  Location: [facility];  Service: Orthopedics;  Laterality: Right;  Marland Kitchen VAGINAL HYSTERECTOMY     Family History  Problem Relation Age of Onset  . Pulmonary embolism Mother   . Healthy Father    Social History   Socioeconomic History  . Marital status: Soil scientist    Spouse name: Not on file  . Number of children: 1  . Years of education: Not on file  . Highest education level: 12th grade  Occupational History  . Occupation: Retired  Tobacco Use  . Smoking status: Former Smoker    Packs/day: 0.25    Years: 10.00    Pack years: 2.50    Types: Cigarettes    Quit date: 1991    Years since quitting: 30.7  . Smokeless tobacco: Never Used  . Tobacco comment: smoking cessation materials not required  Vaping Use  . Vaping Use: Never used  Substance and Sexual Activity  . Alcohol use: No    Alcohol/week: 0.0 standard drinks  . Drug use: No  . Sexual activity: Not Currently  Other Topics Concern  . Not on file  Social History Narrative  . Not on file   Social Determinants of Health   Financial Resource Strain: Low Risk   . Difficulty of Paying Living Expenses: Not hard at all  Food Insecurity: No Food Insecurity  . Worried  About Charity fundraiser in the Last Year: Never true  . Ran Out of Food in the Last Year: Never true  Transportation Needs: No Transportation Needs  . Lack of Transportation (Medical): No  . Lack of Transportation (Non-Medical): No  Physical Activity: Insufficiently Active  . Days of Exercise per Week: 2 days  . Minutes of Exercise per Session: 30 min  Stress: No Stress Concern Present  . Feeling of Stress : Not at all  Social Connections: Unknown  . Frequency of Communication with Friends and Family: Patient refused  . Frequency of Social Gatherings with Friends and Family: Patient refused  . Attends Religious Services: Patient refused  . Active Member of Clubs or Organizations: Patient refused  . Attends Archivist Meetings: Patient refused  . Marital Status: Widowed    Tobacco Counseling Counseling given: Not Answered Comment: smoking cessation materials not required   Clinical Intake:  Pre-visit preparation completed:  Yes  Pain : No/denies pain     Nutritional Risks: None Diabetes: No  How often do you need to have someone help you when you read instructions, pamphlets, or other written materials from your doctor or pharmacy?: 1 - Never    Interpreter Needed?: No  Information entered by :: Clemetine Marker LPN   Activities of Daily Living In your present state of health, do you have any difficulty performing the following activities: 12/02/2019  Hearing? N  Comment declines hearing aids  Vision? N  Difficulty concentrating or making decisions? N  Walking or climbing stairs? N  Dressing or bathing? N  Doing errands, shopping? N  Preparing Food and eating ? N  Using the Toilet? N  In the past six months, have you accidently leaked urine? N  Do you have problems with loss of bowel control? N  Managing your Medications? N  Managing your Finances? N  Housekeeping or managing your Housekeeping? N  Some recent data might be hidden    Patient Care  Team: Juline Patch, MD as PCP - General (Family Medicine)  Indicate any recent Medical Services you may have received from other than [facility] providers in the past year (date may be approximate).     Assessment:   This is a routine wellness examination for [place].  Hearing/Vision screen  Hearing Screening   125Hz  250Hz  500Hz  1000Hz  2000Hz  3000Hz  4000Hz  6000Hz  8000Hz   Right ear:           Left ear:           Comments: Pt denies hearing difficulty  Vision Screening Comments: Annual vision screenings done by Dr. Atilano Median  Dietary issues and exercise activities discussed: Current Exercise Habits: Home exercise routine, Type of exercise: walking, Time (Minutes): 30, Frequency (Times/Week): 2, Weekly Exercise (Minutes/Week): 60, Intensity: Moderate, Exercise limited by: None identified  Goals    . DIET - INCREASE WATER INTAKE     Recommend to drink at least 6-8 8oz glasses of water per day.      Depression Screen PHQ 2/9 Scores 12/02/2019 09/05/2019 09/03/2019 07/04/2019 03/01/2019 09/25/2018 02/02/2018  PHQ - 2 Score 0 1 0 0 1 0 0  PHQ- 9 Score - 1 5 0 4 0 0    Fall Risk Fall Risk  12/02/2019 09/05/2019 09/03/2019 07/04/2019 01/29/2018  Falls in the past year? 0 0 0 0 0  Number falls in past yr: 0 - - - 0  Injury with Fall? 0 - - - 0  Risk for fall due to : No Fall Risks No Fall Risks - - -  Follow up Falls prevention discussed Falls evaluation completed Falls evaluation completed Falls evaluation completed -    Any stairs in or around the home? Yes  If so, are there any without handrails? No    Home free of loose throw rugs in walkways, pet beds, electrical cords, etc? Yes  Adequate lighting in your home to reduce risk of falls? Yes   ASSISTIVE DEVICES UTILIZED TO PREVENT FALLS:  Life alert? No  Use of a cane, walker or w/c? No  Grab bars in the bathroom? Yes  Shower chair or bench in shower? Yes  Elevated toilet seat or a handicapped toilet? Yes   TIMED UP AND GO:  Was the  test performed? No . Telephonic visit.   Cognitive Function:     6CIT Screen 12/02/2019 04/06/2017  What Year? 0 points 0 points  What month? 0 points 0 points  What  time? 0 points 0 points  Count back from 20 0 points 0 points  Months in reverse 0 points 0 points  Repeat phrase 0 points 0 points  Total Score 0 0    Immunizations Immunization History  Administered Date(s) Administered  . Influenza, High Dose Seasonal PF 11/22/2018  . Influenza,inj,Quad PF,6+ Mos 11/07/2017  . Influenza-Unspecified 12/08/2015, 12/12/2016, 11/22/2018  . PFIZER SARS-COV-2 Vaccination 04/04/2019, 04/25/2019  . Pneumococcal Conjugate-13 04/06/2017  . Pneumococcal Polysaccharide-23 12/03/2018  . Zoster Recombinat (Shingrix) 07/26/2017, 10/02/2017    TDAP status: Due, Education has been provided regarding the importance of this vaccine. Advised may receive this vaccine at local pharmacy or Health Dept. Aware to provide a copy of the vaccination record if obtained from local pharmacy or Health Dept. Verbalized acceptance and understanding.   Flu Vaccine status: Declined, Education has been provided regarding the importance of this vaccine but patient still declined. Advised may receive this vaccine at local pharmacy or Health Dept. Aware to provide a copy of the vaccination record if obtained from local pharmacy or Health Dept. Verbalized acceptance and understanding.   Pneumococcal vaccine status: Up to date   Covid-19 vaccine status: Completed vaccines  Qualifies for Shingles Vaccine? Yes   Zostavax completed Yes   Shingrix Completed?: Yes  Screening Tests Health Maintenance  Topic Date Due  . INFLUENZA VACCINE  10/13/2019  . COVID-19 Vaccine  Completed  . PNA vac Low Risk Adult  Completed  . DEXA SCAN  Discontinued  . TETANUS/TDAP  Discontinued    Health Maintenance  Health Maintenance Due  Topic Date Due  . INFLUENZA VACCINE  10/13/2019    Colorectal cancer screening: No longer  required.    Mammogram status: No longer required.    Bone Density status: No longer required  Lung Cancer Screening: (Low Dose CT Chest recommended if Age 60-80 years, 30 pack-year currently smoking OR have quit w/in 15years.) does not qualify.   Additional Screening:  Hepatitis C Screening: No longer required  Vision Screening: Recommended annual ophthalmology exams for early detection of glaucoma and other disorders of the eye. Is the patient up to date with their annual eye exam?  Yes  Who is the provider or what is the name of the office in which the patient attends annual eye exams? Dr. Atilano Median  Dental Screening: Recommended annual dental exams for proper oral hygiene  Community Resource Referral / Chronic Care Management: CRR required this visit?  No   CCM required this visit?  No      Plan:     I have personally reviewed and noted the following in the patient's chart:   . Medical and social history . Use of alcohol, tobacco or illicit drugs  . Current medications and supplements . Functional ability and status . Nutritional status . Physical activity . Advanced directives . List of other physicians . Hospitalizations, surgeries, and ER visits in previous 12 months . Vitals . Screenings to include cognitive, depression, and falls . Referrals and appointments  In addition, I have reviewed and discussed with patient certain preventive protocols, quality metrics, and best practice recommendations. A written personalized care plan for preventive services as well as general preventive health recommendations were provided to patient.     Clemetine Marker, LPN   [id number]   Nurse Notes: none

## 2019-12-02 NOTE — Patient Instructions (Signed)
Kaitlyn Oliver , Thank you for taking time to come for your Medicare Wellness Visit. I appreciate your ongoing commitment to your health goals. Please review the following plan we discussed and let me know if I can assist you in the future.   Screening recommendations/referrals: Colonoscopy: no longer required Bone Density: no longer required Recommended yearly ophthalmology/optometry visit for glaucoma screening and checkup Recommended yearly dental visit for hygiene and checkup  Vaccinations: Influenza vaccine: due Pneumococcal vaccine: done 12/03/18 Tdap vaccine: due Shingles vaccine: done 07/26/17 & 10/02/17   Covid-19: done 04/04/19 & 04/25/19  Advanced directives: Please bring a copy of your health care power of attorney and living will to the office at your convenience.  Conditions/risks identified: Keep up the great work!  Next appointment: Follow up in one year for your annual wellness visit    Preventive Care 65 Years and Older, Female Preventive care refers to lifestyle choices and visits with your health care provider that can promote health and wellness. What does preventive care include?  A yearly physical exam. This is also called an annual well check.  Dental exams once or twice a year.  Routine eye exams. Ask your health care provider how often you should have your eyes checked.  Personal lifestyle choices, including:  Daily care of your teeth and gums.  Regular physical activity.  Eating a healthy diet.  Avoiding tobacco and drug use.  Limiting alcohol use.  Practicing safe sex.  Taking low-dose aspirin every day.  Taking vitamin and mineral supplements as recommended by your health care provider. What happens during an annual well check? The services and screenings done by your health care provider during your annual well check will depend on your age, overall health, lifestyle risk factors, and family history of disease. Counseling  Your health care  provider may ask you questions about your:  Alcohol use.  Tobacco use.  Drug use.  Emotional well-being.  Home and relationship well-being.  Sexual activity.  Eating habits.  History of falls.  Memory and ability to understand (cognition).  Work and work Statistician.  Reproductive health. Screening  You may have the following tests or measurements:  Height, weight, and BMI.  Blood pressure.  Lipid and cholesterol levels. These may be checked every 5 years, or more frequently if you are over 48 years old.  Skin check.  Lung cancer screening. You may have this screening every year starting at age 59 if you have a 30-pack-year history of smoking and currently smoke or have quit within the past 15 years.  Fecal occult blood test (FOBT) of the stool. You may have this test every year starting at age 35.  Flexible sigmoidoscopy or colonoscopy. You may have a sigmoidoscopy every 5 years or a colonoscopy every 10 years starting at age 44.  Hepatitis C blood test.  Hepatitis B blood test.  Sexually transmitted disease (STD) testing.  Diabetes screening. This is done by checking your blood sugar (glucose) after you have not eaten for a while (fasting). You may have this done every 1-3 years.  Bone density scan. This is done to screen for osteoporosis. You may have this done starting at age 28.  Mammogram. This may be done every 1-2 years. Talk to your health care provider about how often you should have regular mammograms. Talk with your health care provider about your test results, treatment options, and if necessary, the need for more tests. Vaccines  Your health care provider may recommend certain vaccines, such  as:  Influenza vaccine. This is recommended every year.  Tetanus, diphtheria, and acellular pertussis (Tdap, Td) vaccine. You may need a Td booster every 10 years.  Zoster vaccine. You may need this after age 78.  Pneumococcal 13-valent conjugate (PCV13)  vaccine. One dose is recommended after age 42.  Pneumococcal polysaccharide (PPSV23) vaccine. One dose is recommended after age 37. Talk to your health care provider about which screenings and vaccines you need and how often you need them. This information is not intended to replace advice given to you by your health care provider. Make sure you discuss any questions you have with your health care provider. Document Released: 03/27/2015 Document Revised: 11/18/2015 Document Reviewed: 12/30/2014 Elsevier Interactive Patient Education  2017 [place] Prevention in the Home Falls can cause injuries. They can happen to people of all ages. There are many things you can do to make your home safe and to help prevent falls. What can I do on the outside of my home?  Regularly fix the edges of walkways and driveways and fix any cracks.  Remove anything that might make you trip as you walk through a door, such as a raised step or threshold.  Trim any bushes or trees on the path to your home.  Use bright outdoor lighting.  Clear any walking paths of anything that might make someone trip, such as rocks or tools.  Regularly check to see if handrails are loose or broken. Make sure that both sides of any steps have handrails.  Any raised decks and porches should have guardrails on the edges.  Have any leaves, snow, or ice cleared regularly.  Use sand or salt on walking paths during winter.  Clean up any spills in your garage right away. This includes oil or grease spills. What can I do in the bathroom?  Use night lights.  Install grab bars by the toilet and in the tub and shower. Do not use towel bars as grab bars.  Use non-skid mats or decals in the tub or shower.  If you need to sit down in the shower, use a plastic, non-slip stool.  Keep the floor dry. Clean up any water that spills on the floor as soon as it happens.  Remove soap buildup in the tub or shower  regularly.  Attach bath mats securely with double-sided non-slip rug tape.  Do not have throw rugs and other things on the floor that can make you trip. What can I do in the bedroom?  Use night lights.  Make sure that you have a light by your bed that is easy to reach.  Do not use any sheets or blankets that are too big for your bed. They should not hang down onto the floor.  Have a firm chair that has side arms. You can use this for support while you get dressed.  Do not have throw rugs and other things on the floor that can make you trip. What can I do in the kitchen?  Clean up any spills right away.  Avoid walking on wet floors.  Keep items that you use a lot in easy-to-reach places.  If you need to reach something above you, use a strong step stool that has a grab bar.  Keep electrical cords out of the way.  Do not use floor polish or wax that makes floors slippery. If you must use wax, use non-skid floor wax.  Do not have throw rugs and other things on the  floor that can make you trip. What can I do with my stairs?  Do not leave any items on the stairs.  Make sure that there are handrails on both sides of the stairs and use them. Fix handrails that are broken or loose. Make sure that handrails are as long as the stairways.  Check any carpeting to make sure that it is firmly attached to the stairs. Fix any carpet that is loose or worn.  Avoid having throw rugs at the top or bottom of the stairs. If you do have throw rugs, attach them to the floor with carpet tape.  Make sure that you have a light switch at the top of the stairs and the bottom of the stairs. If you do not have them, ask someone to add them for you. What else can I do to help prevent falls?  Wear shoes that:  Do not have high heels.  Have rubber bottoms.  Are comfortable and fit you well.  Are closed at the toe. Do not wear sandals.  If you use a stepladder:  Make sure that it is fully  opened. Do not climb a closed stepladder.  Make sure that both sides of the stepladder are locked into place.  Ask someone to hold it for you, if possible.  Clearly mark and make sure that you can see:  Any grab bars or handrails.  First and last steps.  Where the edge of each step is.  Use tools that help you move around (mobility aids) if they are needed. These include:  Canes.  Walkers.  Scooters.  Crutches.  Turn on the lights when you go into a dark area. Replace any light bulbs as soon as they burn out.  Set up your furniture so you have a clear path. Avoid moving your furniture around.  If any of your floors are uneven, fix them.  If there are any pets around you, be aware of where they are.  Review your medicines with your doctor. Some medicines can make you feel dizzy. This can increase your chance of falling. Ask your doctor what other things that you can do to help prevent falls. This information is not intended to replace advice given to you by your health care provider. Make sure you discuss any questions you have with your health care provider. Document Released: 12/25/2008 Document Revised: 08/06/2015 Document Reviewed: 04/04/2014 Elsevier Interactive Patient Education  2017 [employer].

## 2019-12-25 ENCOUNTER — Ambulatory Visit (INDEPENDENT_AMBULATORY_CARE_PROVIDER_SITE_OTHER): Payer: Medicare HMO

## 2019-12-25 ENCOUNTER — Other Ambulatory Visit: Payer: Self-pay

## 2019-12-25 DIAGNOSIS — Z23 Encounter for immunization: Secondary | ICD-10-CM | POA: Diagnosis not present

## 2019-12-30 DIAGNOSIS — E039 Hypothyroidism, unspecified: Secondary | ICD-10-CM | POA: Diagnosis not present

## 2020-01-01 ENCOUNTER — Other Ambulatory Visit: Payer: Self-pay

## 2020-01-01 ENCOUNTER — Emergency Department: Payer: Medicare HMO

## 2020-01-01 ENCOUNTER — Observation Stay
Admission: EM | Admit: 2020-01-01 | Discharge: 2020-01-02 | Disposition: A | Discharge status: Home / Self Care | Payer: Medicare HMO | Attending: Internal Medicine | Admitting: Internal Medicine

## 2020-01-01 ENCOUNTER — Inpatient Hospital Stay: Payer: Medicare HMO

## 2020-01-01 ENCOUNTER — Inpatient Hospital Stay
Admit: 2020-01-01 | Discharge: 2020-01-01 | Disposition: A | Discharge status: Home / Self Care | Payer: Medicare HMO | Attending: Physician Assistant | Admitting: Physician Assistant

## 2020-01-01 DIAGNOSIS — R778 Other specified abnormalities of plasma proteins: Secondary | ICD-10-CM | POA: Diagnosis present

## 2020-01-01 DIAGNOSIS — I77810 Thoracic aortic ectasia: Secondary | ICD-10-CM | POA: Diagnosis not present

## 2020-01-01 DIAGNOSIS — E039 Hypothyroidism, unspecified: Secondary | ICD-10-CM | POA: Diagnosis not present

## 2020-01-01 DIAGNOSIS — R079 Chest pain, unspecified: Secondary | ICD-10-CM | POA: Diagnosis not present

## 2020-01-01 DIAGNOSIS — I716 Thoracoabdominal aortic aneurysm, without rupture, unspecified: Secondary | ICD-10-CM | POA: Diagnosis present

## 2020-01-01 DIAGNOSIS — E876 Hypokalemia: Secondary | ICD-10-CM | POA: Diagnosis present

## 2020-01-01 DIAGNOSIS — I10 Essential (primary) hypertension: Secondary | ICD-10-CM | POA: Diagnosis present

## 2020-01-01 DIAGNOSIS — R0602 Shortness of breath: Secondary | ICD-10-CM | POA: Diagnosis not present

## 2020-01-01 DIAGNOSIS — Z20822 Contact with and (suspected) exposure to covid-19: Secondary | ICD-10-CM | POA: Diagnosis not present

## 2020-01-01 DIAGNOSIS — I4891 Unspecified atrial fibrillation: Secondary | ICD-10-CM | POA: Diagnosis not present

## 2020-01-01 DIAGNOSIS — I214 Non-ST elevation (NSTEMI) myocardial infarction: Secondary | ICD-10-CM | POA: Diagnosis present

## 2020-01-01 DIAGNOSIS — I2510 Atherosclerotic heart disease of native coronary artery without angina pectoris: Secondary | ICD-10-CM | POA: Diagnosis not present

## 2020-01-01 DIAGNOSIS — R7989 Other specified abnormal findings of blood chemistry: Secondary | ICD-10-CM | POA: Diagnosis not present

## 2020-01-01 DIAGNOSIS — R0789 Other chest pain: Secondary | ICD-10-CM | POA: Diagnosis not present

## 2020-01-01 DIAGNOSIS — F419 Anxiety disorder, unspecified: Secondary | ICD-10-CM | POA: Diagnosis present

## 2020-01-01 DIAGNOSIS — Z87891 Personal history of nicotine dependence: Secondary | ICD-10-CM | POA: Insufficient documentation

## 2020-01-01 DIAGNOSIS — K219 Gastro-esophageal reflux disease without esophagitis: Secondary | ICD-10-CM | POA: Diagnosis present

## 2020-01-01 DIAGNOSIS — F32A Depression, unspecified: Secondary | ICD-10-CM | POA: Diagnosis present

## 2020-01-01 DIAGNOSIS — K279 Peptic ulcer, site unspecified, unspecified as acute or chronic, without hemorrhage or perforation: Secondary | ICD-10-CM | POA: Diagnosis present

## 2020-01-01 DIAGNOSIS — J9809 Other diseases of bronchus, not elsewhere classified: Secondary | ICD-10-CM | POA: Diagnosis not present

## 2020-01-01 LAB — CBC
HCT: 38.4 % (ref 36.0–46.0)
Hemoglobin: 13.1 g/dL (ref 12.0–15.0)
MCH: 30.1 pg (ref 26.0–34.0)
MCHC: 34.1 g/dL (ref 30.0–36.0)
MCV: 88.3 fL (ref 80.0–100.0)
Platelets: 141 10*3/uL — ABNORMAL LOW (ref 150–400)
RBC: 4.35 MIL/uL (ref 3.87–5.11)
RDW: 15.1 % (ref 11.5–15.5)
WBC: 4.3 10*3/uL (ref 4.0–10.5)
nRBC: 0 % (ref 0.0–0.2)

## 2020-01-01 LAB — TROPONIN I (HIGH SENSITIVITY)
Troponin I (High Sensitivity): 60 ng/L — ABNORMAL HIGH (ref <18)
Troponin I (High Sensitivity): 72 ng/L — ABNORMAL HIGH (ref <18)
Troponin I (High Sensitivity): 68 ng/L — ABNORMAL HIGH (ref <18)
Troponin I (High Sensitivity): 89 ng/L — ABNORMAL HIGH (ref <18)
Troponin I (High Sensitivity): 97 ng/L — ABNORMAL HIGH (ref <18)

## 2020-01-01 LAB — BASIC METABOLIC PANEL
Anion gap: 12 (ref 5–15)
BUN: 13 mg/dL (ref 8–23)
CO2: 23 mmol/L (ref 22–32)
Calcium: 9.7 mg/dL (ref 8.9–10.3)
Chloride: 103 mmol/L (ref 98–111)
Creatinine, Ser: 0.82 mg/dL (ref 0.44–1.00)
GFR, Estimated: >60 mL/min (ref >60)
Glucose, Bld: 127 mg/dL — ABNORMAL HIGH (ref 70–99)
Potassium: 2.7 mmol/L — CL (ref 3.5–5.1)
Sodium: 138 mmol/L (ref 135–145)

## 2020-01-01 LAB — URINALYSIS, COMPLETE (UACMP) WITH MICROSCOPIC
Bilirubin Urine: NEGATIVE
Glucose, UA: NEGATIVE mg/dL
Hgb urine dipstick: NEGATIVE
Ketones, ur: 5 mg/dL — AB
Leukocytes,Ua: NEGATIVE
Nitrite: NEGATIVE
Protein, ur: 100 mg/dL — AB
Specific Gravity, Urine: 1.006 (ref 1.005–1.030)
pH: 8 (ref 5.0–8.0)

## 2020-01-01 LAB — RESPIRATORY PANEL BY RT PCR (FLU A&B, COVID)
Influenza A by PCR: NEGATIVE
Influenza B by PCR: NEGATIVE
SARS Coronavirus 2 by RT PCR: NEGATIVE

## 2020-01-01 LAB — PROTIME-INR
INR: 1.1 (ref 0.8–1.2)
Prothrombin Time: 13.3 seconds (ref 11.4–15.2)

## 2020-01-01 LAB — MAGNESIUM: Magnesium: 1.7 mg/dL (ref 1.7–2.4)

## 2020-01-01 LAB — APTT: aPTT: 27 seconds (ref 24–36)

## 2020-01-01 LAB — TSH: TSH: 4.658 u[IU]/mL — ABNORMAL HIGH (ref 0.350–4.500)

## 2020-01-01 MED ORDER — ONDANSETRON HCL 4 MG PO TABS: 4.0000 mg | ORAL_TABLET | Freq: Four times a day (QID) | ORAL | Status: DC | PRN

## 2020-01-01 MED ORDER — AMLODIPINE BESYLATE 5 MG PO TABS
5.0000 mg | ORAL_TABLET | Freq: Every day | ORAL | Status: DC
  Administered 2020-01-01 – 2020-01-02 (×2): 5 mg via ORAL
  Filled 2020-01-01 (×2): qty 1

## 2020-01-01 MED ORDER — NITROGLYCERIN 0.4 MG SL SUBL
0.4000 mg | SUBLINGUAL_TABLET | Freq: Once | SUBLINGUAL | Status: AC
  Administered 2020-01-01: 0.4 mg via SUBLINGUAL

## 2020-01-01 MED ORDER — LEVOTHYROXINE SODIUM 50 MCG PO TABS
50.0000 ug | ORAL_TABLET | Freq: Every day | ORAL | Status: DC
  Administered 2020-01-01 – 2020-01-02 (×2): 50 ug via ORAL
  Filled 2020-01-01 (×2): qty 1

## 2020-01-01 MED ORDER — POTASSIUM CHLORIDE CRYS ER 20 MEQ PO TBCR
40.0000 meq | EXTENDED_RELEASE_TABLET | Freq: Once | ORAL | Status: AC
  Administered 2020-01-01: 40 meq via ORAL
  Filled 2020-01-01: qty 2

## 2020-01-01 MED ORDER — POTASSIUM CHLORIDE 10 MEQ/100ML IV SOLN
10.0000 meq | Freq: Once | INTRAVENOUS | Status: AC
  Administered 2020-01-01: 10 meq via INTRAVENOUS
  Filled 2020-01-01: qty 100

## 2020-01-01 MED ORDER — SODIUM CHLORIDE 0.9 % IV SOLN: INTRAVENOUS | Status: DC

## 2020-01-01 MED ORDER — ENOXAPARIN SODIUM 40 MG/0.4ML ~~LOC~~ SOLN
40.0000 mg | SUBCUTANEOUS | Status: DC
  Administered 2020-01-01: 40 mg via SUBCUTANEOUS
  Filled 2020-01-01: qty 0.4

## 2020-01-01 MED ORDER — ACETAMINOPHEN 325 MG PO TABS: 650.0000 mg | ORAL_TABLET | Freq: Four times a day (QID) | ORAL | Status: DC | PRN

## 2020-01-01 MED ORDER — IOHEXOL 350 MG/ML SOLN
100.0000 mL | Freq: Once | INTRAVENOUS | Status: AC | PRN
  Administered 2020-01-01: 100 mL via INTRAVENOUS

## 2020-01-01 MED ORDER — MORPHINE SULFATE (PF) 2 MG/ML IV SOLN: 0.5000 mg | INTRAVENOUS | Status: DC | PRN

## 2020-01-01 MED ORDER — ATORVASTATIN CALCIUM 20 MG PO TABS
40.0000 mg | ORAL_TABLET | Freq: Every day | ORAL | Status: DC
  Administered 2020-01-01: 40 mg via ORAL
  Filled 2020-01-01 (×2): qty 2

## 2020-01-01 MED ORDER — MAGNESIUM SULFATE IN D5W 1-5 GM/100ML-% IV SOLN
1.0000 g | Freq: Once | INTRAVENOUS | Status: AC
  Administered 2020-01-01: 1 g via INTRAVENOUS
  Filled 2020-01-01: qty 100

## 2020-01-01 MED ORDER — PANTOPRAZOLE SODIUM 40 MG PO TBEC
40.0000 mg | DELAYED_RELEASE_TABLET | Freq: Every day | ORAL | Status: DC
  Administered 2020-01-01 – 2020-01-02 (×2): 40 mg via ORAL
  Filled 2020-01-01 (×2): qty 1

## 2020-01-01 MED ORDER — NITROGLYCERIN 0.4 MG SL SUBL: 0.4000 mg | SUBLINGUAL_TABLET | SUBLINGUAL | Status: DC | PRN

## 2020-01-01 NOTE — ED Notes (Signed)
Pt had hypotensive response to SL nitro, pt became diaphoretic and nauseous. RN and MD to bedside. 500 NS bolus administered

## 2020-01-01 NOTE — Consult Note (Signed)
CARDIOLOGY CONSULT NOTE               Patient ID: Kaitlyn Oliver MRN: [id number] DOB/AGE: 1934/01/28 86 y.o.  Admit date: 01/01/2020 Referring Physician Blaine Hamper Primary Physician Otilio Miu Primary Cardiologist Nehemiah Massed Reason for Consultation new onset atrial fibrillation with RVR  HPI: 86 year old female referred for evaluation of atrial fibrillation with RVR. 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 The patient reports that she was awoken by significant neck pain with radiation to her chest with associated diaphoresis without shortness of breath or palpations around 3 AM this morning. She took an aspirin and tried to ignore the pain, but it persisted. Upon EMS arrival, the patient was reportedly in atrial fibrillation with ventricular rates in the 130s-140s. Initial ECG showed rapid atrial fibrillation with ST depression. En route to the hospital, she spontaneously converted to sinus rhythm. Repeat ECG showed normal sinus rhythm without acute ST-T wave abnormalities. The patient continued to have chest pain, was given sublingual nitroglycerin, but became hypotensive with systolic blood pressure in the 80s. She was given a fluid bolus.  Admission labs notable for mildly elevated high-sensitivity troponin of 60-> 72->68, hypokalemia of 2.7, and normal renal function.  Chest x-ray reveals peribronchial thickening suggesting airway disease or bronchitis with no focal consolidation.  Chest CT shows stable 4 cm ascending thoracic aortic dilatation, extensive,atheromatous plaque in the thoracic and abdominal Aorta, cirrhosis with signs of mild portal hypertension.  At this time, the patient reports feeling well without recurrent chest pain, neck  pain, palpitations, or shortness of breath.  She denies any recent peripheral edema.  She is active, but reports that she tires easily.  The patient reports a several year history of exertional neck and chest pain that resolves with rest, which has not worsened in severity or increased in frequency recently. Most recent nuclear stress test in 12/2017 showed normal left ventricular function with evidence of scar or ischemia. Previous 2D echocardiogram 12/2017 showed normal left ventricular function with LVEF 55% with moderate mitral regurgitation and moderate to severe tricuspid regurgitation.  Review of systems complete and found to be negative unless listed above     Past Medical History:  Diagnosis Date  . Anxiety   . Arthritis    thumbs, hands  . Cancer [facility]) 1991   bilateral breast  . Depression   . GERD (gastroesophageal reflux disease)   . Headache    occasional - pinched nerve in neck  . Hypertension   . Hypothyroidism   . Thyroid disease   . Wears dentures    full upper, partial lower    Past Surgical History:  Procedure Laterality Date  . BOWEL RESECTION  01/17/2018   Procedure: SMALL BOWEL RESECTION;  Surgeon: Jules Husbands, MD;  Location: [facility];  Service: General;;  . BREAST SURGERY Bilateral 1991   mastectomy  . CATARACT EXTRACTION Bilateral   . CHOLECYSTECTOMY    . COLONOSCOPY  2013   Dr Gustavo Lah  . HALLUX VALGUS AUSTIN Right 06/03/2015   Procedure: HALLUX VALGUS AUSTIN;  Surgeon: Samara Deist, DPM;  Location: [place];  Service: Podiatry;  Laterality: Right;  WITH POPLITEAL  . KNEE ARTHROSCOPY WITH MEDIAL MENISECTOMY Right 08/30/2016   Procedure: KNEE ARTHROSCOPY WITH PARTIAL MEDIAL MENISECTOMY and Partial Lateral Menisectomy;  Surgeon: Hessie Knows, MD;  Location: [facility];  Service: Orthopedics;  Laterality: Right;  . KNEE ARTHROSCOPY WITH SUBCHONDROPLASTY Right 08/30/2016   Procedure: KNEE ARTHROSCOPY WITH SUBCHONDROPLASTY;  Surgeon: Hessie Knows,  MD;  Location: [facility];  Service: Orthopedics;  Laterality: Right;  . LAPAROTOMY N/A 01/17/2018   Procedure: EXPLORATORY LAPAROTOMY;  Surgeon: Jules Husbands, MD;  Location: [facility];  Service: General;  Laterality: N/A;  . MASTECTOMY Bilateral   . ROTATOR CUFF REPAIR Bilateral   . TONSILLECTOMY    . TOTAL KNEE ARTHROPLASTY Left   . TOTAL KNEE ARTHROPLASTY Right 05/30/2017   Procedure: TOTAL KNEE ARTHROPLASTY;  Surgeon: Hessie Knows, MD;  Location: [facility];  Service: Orthopedics;  Laterality: Right;  Marland Kitchen VAGINAL HYSTERECTOMY      (Not in a hospital admission)  Social History   Socioeconomic History  . Marital status: Soil scientist    Spouse name: Not on file  . Number of children: 1  . Years of education: Not on file  . Highest education level: 12th grade  Occupational History  . Occupation: Retired  Tobacco Use  . Smoking status: Former Smoker    Packs/day: 0.25    Years: 10.00    Pack years: 2.50    Types: Cigarettes    Quit date: 1991    Years since quitting: 30.8  . Smokeless tobacco: Never Used  . Tobacco comment: smoking cessation materials not required  Vaping Use  . Vaping Use: Never used  Substance and Sexual Activity  . Alcohol use: No    Alcohol/week: 0.0 standard drinks  . Drug use: No  . Sexual activity: Not Currently  Other Topics Concern  . Not on file  Social History Narrative  . Not on file   Social Determinants of Health   Financial Resource Strain: Low Risk   . Difficulty of Paying Living Expenses: Not hard at all  Food Insecurity: No Food Insecurity  . Worried About Charity fundraiser in the Last Year: Never true  . Ran Out of Food in the Last Year: Never true  Transportation Needs: No Transportation Needs  . Lack of Transportation (Medical): No  . Lack of Transportation (Non-Medical): No  Physical Activity: Insufficiently Active  . Days of Exercise per Week: 2 days  . Minutes of Exercise per Session: 30 min  Stress: No Stress Concern  Present  . Feeling of Stress : Not at all  Social Connections: Unknown  . Frequency of Communication with Friends and Family: Patient refused  . Frequency of Social Gatherings with Friends and Family: Patient refused  . Attends Religious Services: Patient refused  . Active Member of Clubs or Organizations: Patient refused  . Attends Archivist Meetings: Patient refused  . Marital Status: Widowed  Intimate Partner Violence: Not At Risk  . Fear of Current or Ex-Partner: No  . Emotionally Abused: No  . Physically Abused: No  . Sexually Abused: No    Family History  Problem Relation Age of Onset  . Pulmonary embolism Mother   . Healthy Father       Review of systems complete and found to be negative unless listed above      PHYSICAL EXAM  General: Well developed, well nourished, in no acute distress, reclining in bed, pleasant HEENT:  Normocephalic and atramatic Neck:  No JVD.  Lungs: Clear bilaterally to auscultation, normal effort of breathing on room air. Speaking in full sentences without difficulty Heart: HRRR . 2/6 systolic murmur Abdomen: nondistended Msk:  Back normal, gait not assessed. Normal strength and  tone for age. Extremities: No clubbing, cyanosis or edema.   Neuro: Alert and oriented X 3. Psych:  Good affect, responds appropriately  Labs:   Lab Results  Component Value Date   WBC 4.3 01/01/2020   HGB 13.1 01/01/2020   HCT 38.4 01/01/2020   MCV 88.3 01/01/2020   PLT 141 (L) 01/01/2020    Recent Labs  Lab 01/01/20 0438  NA 138  K 2.7*  CL 103  CO2 23  BUN 13  CREATININE 0.82  CALCIUM 9.7  GLUCOSE 127*   Lab Results  Component Value Date   TROPONINI <0.03 11/29/2017    Lab Results  Component Value Date   CHOL 227 (H) 02/15/2017   CHOL 253 (H) 09/11/2014   Lab Results  Component Value Date   HDL 33 (L) 02/15/2017   HDL 38 (L) 09/11/2014   Lab Results  Component Value Date   LDLCALC Comment 02/15/2017   LDLCALC 158 (H)  09/11/2014   Lab Results  Component Value Date   TRIG 534 (H) 02/15/2017   TRIG 284 (H) 09/11/2014   Lab Results  Component Value Date   CHOLHDL 6.9 (H) 02/15/2017   CHOLHDL 6.7 (H) 09/11/2014   No results found for: LDLDIRECT    Radiology: DG Chest Portable 1 View  Result Date: 01/01/2020 CLINICAL DATA:  Chest pain EXAM: PORTABLE CHEST 1 VIEW COMPARISON:  11/29/2017 FINDINGS: Heart size and pulmonary vascularity are normal. Central peribronchial thickening suggesting airways disease or bronchitis. No focal consolidation. No pleural effusions. No pneumothorax. Mediastinal contours appear intact. Postoperative changes in the shoulders. IMPRESSION: Peribronchial thickening suggesting airways disease or bronchitis. No focal consolidation. Electronically Signed   By: Lucienne Capers M.D.   On: 01/01/2020 05:27    EKG: (current) sinus rhythm without acute ST-T wave abnormalities  ASSESSMENT AND PLAN:  1. New onset atrial fibrillation with RVR, with rates in the 130s-150s per EMS, with spontaneous conversion to normal sinus rhythm, in the setting of hypokalemia. Chads vasc score of 4.  Currently in sinus rhythm. 2. Elevated troponin, likely secondary to demand supply ischemia in the setting of rapid ventricular rate. Initial troponin mildly elevated at 60, followed by 72. No significant delta. 3. Hypokalemia, initial K 2.7  4. Essential hypertension, on amlodipine 5. Hyperlipidemia, on atorvastatin 6.  Thoracic aneurysm, chest CT shows stable 4 cm dilatation  Plan: 1. Defer beta blocker or calcium channel blocker at this time due to baseline bradycardia 2. 2D echocardiogram 3. Continue to cycle troponin 4. Continue to monitor on telemetry 5. Defer heparin or chronic anticoagulation at this time 6. Replenish potassium 7. Further recommendations pending patient's initial course.   Signed: Clabe Seal PA-C 01/01/2020, 8:17 AM

## 2020-01-01 NOTE — ED Notes (Signed)
Echo being done at this time

## 2020-01-01 NOTE — ED Notes (Signed)
Date and time results received: 01/01/20 11:03 (use smartphrase ".now" to insert current time)  Test: troponin Critical Value: 18  Name of Provider Notified: Blaine Hamper  Orders Received? Or Actions Taken?: Md notified, see orders

## 2020-01-01 NOTE — ED Provider Notes (Signed)
[facility] Emergency Department Provider Note  Time seen: 5:10 AM  I have reviewed the triage vital signs and the nursing notes.   HISTORY  Chief Complaint Chest Pain   HPI Kaitlyn Oliver is a 86 y.o. female with a past medical history of anxiety, gastric reflux, hypertension, presents to the emergency department for chest pain.  According to the patient around 3:00 this morning she was awoken from her sleep with pain in her chest radiating up into her neck.  Patient states she also felt sweaty.  Patient states that symptoms did not go away so eventually she called EMS.  EMS stated upon arrival patient was noted to be in atrial fibrillation with rapid ventricular response.  Patient denies any history of A. fib previously.  In route to the hospital the patient's spontaneously converted back to a normal sinus rhythm.  Currently patient states very slight tightness much improved from earlier.  Denies any "pain."  Denies any shortness of breath.  Patient denies any recent illnesses, largely negative review of systems.  Past Medical History:  Diagnosis Date  . Anxiety   . Arthritis    thumbs, hands  . Cancer [facility]) 1991   bilateral breast  . Depression   . GERD (gastroesophageal reflux disease)   . Headache    occasional - pinched nerve in neck  . Hypertension   . Hypothyroidism   . Thyroid disease   . Wears dentures    full upper, partial lower    Patient Active Problem List   Diagnosis Date Noted  . Hypokalemia 08/20/2018  . Neuroendocrine carcinoma of small bowel ([place]) 01/26/2018  . Goals of care, counseling/discussion 01/26/2018  . Small bowel tumor   . Pancreatic cyst 01/15/2018  . Anxiety and depression 01/15/2018  . Pancreas cyst 01/15/2018  . Bowel obstruction ([place]) 11/29/2017  . Small bowel obstruction ([place])   . Thoracoabdominal aortic aneurysm (TAAA) ([place]) 11/19/2017  . Status post total knee replacement using cement, right 05/30/2017  .  GERD (gastroesophageal reflux disease) 12/22/2015  . Adenomatous polyp 09/11/2014  . Bilateral cataracts 09/11/2014  . Gastric catarrh 09/11/2014  . Gastroduodenal ulcer 09/11/2014  . Hypothyroidism 09/11/2014  . Cardiac murmur 10/08/2013  . Essential (primary) hypertension 10/08/2013  . Combined fat and carbohydrate induced hyperlipemia 10/08/2013    Past Surgical History:  Procedure Laterality Date  . BOWEL RESECTION  01/17/2018   Procedure: SMALL BOWEL RESECTION;  Surgeon: Jules Husbands, MD;  Location: [facility];  Service: General;;  . BREAST SURGERY Bilateral 1991   mastectomy  . CATARACT EXTRACTION Bilateral   . CHOLECYSTECTOMY    . COLONOSCOPY  2013   Dr Gustavo Lah  . HALLUX VALGUS AUSTIN Right 06/03/2015   Procedure: HALLUX VALGUS AUSTIN;  Surgeon: Samara Deist, DPM;  Location: [place];  Service: Podiatry;  Laterality: Right;  WITH POPLITEAL  . KNEE ARTHROSCOPY WITH MEDIAL MENISECTOMY Right 08/30/2016   Procedure: KNEE ARTHROSCOPY WITH PARTIAL MEDIAL MENISECTOMY and Partial Lateral Menisectomy;  Surgeon: Hessie Knows, MD;  Location: [facility];  Service: Orthopedics;  Laterality: Right;  . KNEE ARTHROSCOPY WITH SUBCHONDROPLASTY Right 08/30/2016   Procedure: KNEE ARTHROSCOPY WITH SUBCHONDROPLASTY;  Surgeon: Hessie Knows, MD;  Location: [facility];  Service: Orthopedics;  Laterality: Right;  . LAPAROTOMY N/A 01/17/2018   Procedure: EXPLORATORY LAPAROTOMY;  Surgeon: Jules Husbands, MD;  Location: [facility];  Service: General;  Laterality: N/A;  . MASTECTOMY Bilateral   . ROTATOR CUFF REPAIR Bilateral   . TONSILLECTOMY    .  TOTAL KNEE ARTHROPLASTY Left   . TOTAL KNEE ARTHROPLASTY Right 05/30/2017   Procedure: TOTAL KNEE ARTHROPLASTY;  Surgeon: Hessie Knows, MD;  Location: [facility];  Service: Orthopedics;  Laterality: Right;  Marland Kitchen VAGINAL HYSTERECTOMY      Prior to Admission medications   Medication Sig Start Date End Date Taking? Authorizing Provider  acetaminophen  (TYLENOL) 325 MG tablet Take 1-2 tablets (325-650 mg total) by mouth every 6 (six) hours as needed for mild pain (pain score 1-3 or temp > 100.5). 06/01/17   Duanne Guess, PA-C  amLODipine (NORVASC) 5 MG tablet Take 1 tablet (5 mg total) by mouth daily. Patient taking differently: Take 5 mg by mouth daily.  07/04/19   Juline Patch, MD  busPIRone (BUSPAR) 7.5 MG tablet Take 1 tablet (7.5 mg total) by mouth 2 (two) times daily. 07/04/19   Juline Patch, MD  EUTHYROX 50 MCG tablet Take 50 mcg by mouth daily. 10/29/19   [provider]  omeprazole (PRILOSEC) 20 MG capsule Take 1 capsule (20 mg total) by mouth daily. 07/04/19   Juline Patch, MD    Allergies  Allergen Reactions  . Adhesive [Tape] Dermatitis    Can not use plastic tape. Paper tape ok   . Penicillin G Itching    Has patient had a PCN reaction causing immediate rash, facial/tongue/throat swelling, SOB or lightheadedness with hypotension: No Has patient had a PCN reaction causing severe rash involving mucus membranes or skin necrosis: No Has patient had a PCN reaction that required hospitalization: No Has patient had a PCN reaction occurring within the last 10 years: No If all of the above answers are "NO", then may proceed with Cephalosporin use.   Marland Kitchen Hydrocodone-Acetaminophen Itching and Rash    Family History  Problem Relation Age of Onset  . Pulmonary embolism Mother   . Healthy Father     Social History Social History   Tobacco Use  . Smoking status: Former Smoker    Packs/day: 0.25    Years: 10.00    Pack years: 2.50    Types: Cigarettes    Quit date: 1991    Years since quitting: 30.8  . Smokeless tobacco: Never Used  . Tobacco comment: smoking cessation materials not required  Vaping Use  . Vaping Use: Never used  Substance Use Topics  . Alcohol use: No    Alcohol/week: 0.0 standard drinks  . Drug use: No    Review of Systems Constitutional: Negative for fever. Cardiovascular: Positive  for chest tightness. Respiratory: Negative for shortness of breath. Gastrointestinal: Negative for abdominal pain, vomiting  Genitourinary: Patient feels like she needs to urinate but cannot.  No dysuria. Musculoskeletal: Negative for musculoskeletal complaints Neurological: Negative for headache All other ROS negative  ____________________________________________   PHYSICAL EXAM:  VITAL SIGNS: ED Triage Vitals  Enc Vitals Group     BP 01/01/20 0436 (!) 143/100     Pulse Rate 01/01/20 0436 74     Resp 01/01/20 0436 12     Temp 01/01/20 0436 98 F (36.7 C)     Temp Source 01/01/20 0436 Oral     SpO2 01/01/20 0436 95 %     Weight 01/01/20 0434 194 lb (88 kg)     Height 01/01/20 0434 5\' 5"  (1.651 m)     Head Circumference --      Peak Flow --      Pain Score 01/01/20 0434 0     Pain Loc --  Pain Edu? --      Excl. in [place]? --    Constitutional: Alert and oriented. Well appearing and in no distress. Eyes: Normal exam ENT      Head: Normocephalic and atraumatic.      Mouth/Throat: Mucous membranes are moist. Cardiovascular: Normal rate, regular rhythm. No murmur Respiratory: Normal respiratory effort without tachypnea nor retractions. Breath sounds are clear Gastrointestinal: Soft and nontender. No distention.   Musculoskeletal: Nontender with normal range of motion in all extremities. No lower extremity tenderness Neurologic:  Normal speech and language. No gross focal neurologic deficits  Skin:  Skin is warm, dry and intact.  Psychiatric: Mood and affect are normal.  ____________________________________________    EKG  EKG viewed and interpreted by myself shows a normal sinus rhythm at 71 bpm with a narrow QRS, normal axis, normal intervals, no concerning ST changes.  ____________________________________________    RADIOLOGY  Chest x-ray suggest possible bronchitis.  No consolidation.  ____________________________________________   INITIAL IMPRESSION /  ASSESSMENT AND PLAN / ED COURSE  Pertinent labs & imaging results that were available during my care of the patient were reviewed by me and considered in my medical decision making (see chart for details).   Patient presents emergency department complaining of chest pain/tightness that awoke her from her sleep around 3:00 this morning.  I have reviewed the patient's EMS rhythm strip/EKG patient appears to have been in atrial fibrillation around 130 to 150 bpm.  Patient did have some ST depressions which appear to be more consistent with demand ischemia.  Given the patient's symptoms we will check labs including cardiac enzymes as well as a chest x-ray.  We will continue to closely monitor the patient in the emergency department.  Patient did take a full-strength aspirin prior to arrival.  Patient also states on review of systems she feels like she needs to urinate but cannot.  We will check a urinalysis as well.  Patient's labs show an elevated troponin.  Given the patient's chest pain with diaphoresis reported early this morning along with elevated troponin we will admit to the hospitalist service for further work-up and treatment.  Patient did state her chest pain began to come back currently rates it as a 3/10 tightness.  We will dose 1 tablet of nitroglycerin and reassess.  Shortly after receiving nitroglycerin patient became diaphoretic, repeat blood pressure shows systolic in the 55D.  We will dose IV fluid bolus.  Repeat EKG performed.  Repeat EKG viewed and interpreted by myself shows sinus bradycardia at 48 bpm with a narrow QRS, normal axis, normal intervals with no concerning ST changes noted.  Voncille Natividad Schlosser was evaluated in Emergency Department on 01/01/2020 for the symptoms described in the history of present illness. She was evaluated in the context of the global COVID-19 pandemic, which necessitated consideration that the patient might be at risk for infection with the SARS-CoV-2  virus that causes COVID-19. Institutional protocols and algorithms that pertain to the evaluation of patients at risk for COVID-19 are in a state of rapid change based on information released by regulatory bodies including the CDC and federal and state organizations. These policies and algorithms were followed during the patient's care in the ED.  ____________________________________________   FINAL CLINICAL IMPRESSION(S) / ED DIAGNOSES  Chest pain   Harvest Dark, MD 01/01/20 [phone]

## 2020-01-01 NOTE — ED Notes (Signed)
Attempted to given report. Nurse not able to receive report at this time.

## 2020-01-01 NOTE — ED Notes (Signed)
Pt transported to CT ?

## 2020-01-01 NOTE — H&P (Addendum)
History and Physical    Kaitlyn Oliver XFG:[id number] DOB: Oct 20, 1933 DOA: 01/01/2020  Referring MD/NP/PA:   PCP: Juline Patch, MD   Patient coming from:  The patient is coming from home.  At baseline, pt is independent for most of ADL.        Chief Complaint: chest pain  HPI: Kaitlyn Oliver is a 86 y.o. 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  Patient states that she was awoken from sleep by chest pain around 3:00 AM. Pt states that initially she had neck pain, then it moved down to her chest. Currently the chest pain is located in the central chest, initially 10 out of 10 severity, currently 3 out of 10 severity, dull. Patient does not have shortness of breath, cough, fever or chills. She states that she took full dose of aspirin at  home, but states that she is allergic to aspirin, refused to take more aspirin.  Per EMS, patient was noted to have atrial fibrillation with RVR, heart rate 130-150s.    Patient had diaphoresis.  In route to the hospital the patient's spontaneously converted back to a normal sinus rhythm.  No history of atrial fibrillation. Per report, patient had low blood pressure after received nitroglycerin sublingually.  Current blood pressure is 139/69. Patient states that she had nausea earlier, which has resolved. Currently no vomiting, diarrhea or abdominal pain, no symptoms of UTI or unilateral weakness.  ED Course: pt was found to have troponin 60, WBC 4.3, pending Covid PCR, potassium 2.7, renal function okay, temperature normal, blood pressure 139/69, current heart rate 50s-70s, RR 13, oxygen saturation 97% on room air.  Chest x-ray showed peribronchial thickening.  Review of Systems:   General: no fevers,  chills, no body weight gain, has fatigue HEENT: no blurry vision, hearing changes or sore throat Respiratory: no dyspnea, coughing, wheezing CV: has chest pain, no palpitations GI: had nausea, no vomiting, abdominal pain, diarrhea, constipation GU: no dysuria, burning on urination, increased urinary frequency, hematuria  Ext: no leg edema Neuro: no unilateral weakness, numbness, or tingling, no vision change or hearing loss Skin: no rash, no skin tear. MSK: No muscle spasm, no deformity, no limitation of range of movement in spin Heme: No easy bruising.  Travel history: No recent long distant travel.  Allergy:  Allergies  Allergen Reactions  . Adhesive [Tape] Dermatitis    Can not use plastic tape. Paper tape ok   . Asa [Aspirin]     Itchy   . Penicillin G Itching    Has patient had a PCN reaction causing immediate rash, facial/tongue/throat swelling, SOB or lightheadedness with hypotension: No Has patient had a PCN reaction causing severe rash involving mucus membranes or skin necrosis: No Has patient had a PCN reaction that required hospitalization: No Has patient had a PCN reaction occurring within the last 10 years: No If all of the above answers are "NO", then may proceed with Cephalosporin use.   Marland Kitchen Hydrocodone-Acetaminophen Itching and Rash    Past Medical History:  Diagnosis Date  . Anxiety   . Arthritis    thumbs, hands  . Cancer [facility]) 1991   bilateral breast  . Depression   . GERD (gastroesophageal reflux disease)   . Headache    occasional - pinched nerve in neck  . Hypertension   .  Hypothyroidism   . Thyroid disease   . Wears dentures    full upper, partial lower    Past Surgical History:  Procedure Laterality Date  . BOWEL RESECTION  01/17/2018   Procedure: SMALL BOWEL RESECTION;  Surgeon: Jules Husbands, MD;  Location: [facility];  Service: General;;  . BREAST SURGERY Bilateral 1991   mastectomy  . CATARACT EXTRACTION Bilateral   . CHOLECYSTECTOMY      . COLONOSCOPY  2013   Dr Gustavo Lah  . HALLUX VALGUS AUSTIN Right 06/03/2015   Procedure: HALLUX VALGUS AUSTIN;  Surgeon: Samara Deist, DPM;  Location: [place];  Service: Podiatry;  Laterality: Right;  WITH POPLITEAL  . KNEE ARTHROSCOPY WITH MEDIAL MENISECTOMY Right 08/30/2016   Procedure: KNEE ARTHROSCOPY WITH PARTIAL MEDIAL MENISECTOMY and Partial Lateral Menisectomy;  Surgeon: Hessie Knows, MD;  Location: [facility];  Service: Orthopedics;  Laterality: Right;  . KNEE ARTHROSCOPY WITH SUBCHONDROPLASTY Right 08/30/2016   Procedure: KNEE ARTHROSCOPY WITH SUBCHONDROPLASTY;  Surgeon: Hessie Knows, MD;  Location: [facility];  Service: Orthopedics;  Laterality: Right;  . LAPAROTOMY N/A 01/17/2018   Procedure: EXPLORATORY LAPAROTOMY;  Surgeon: Jules Husbands, MD;  Location: [facility];  Service: General;  Laterality: N/A;  . MASTECTOMY Bilateral   . ROTATOR CUFF REPAIR Bilateral   . TONSILLECTOMY    . TOTAL KNEE ARTHROPLASTY Left   . TOTAL KNEE ARTHROPLASTY Right 05/30/2017   Procedure: TOTAL KNEE ARTHROPLASTY;  Surgeon: Hessie Knows, MD;  Location: [facility];  Service: Orthopedics;  Laterality: Right;  Marland Kitchen VAGINAL HYSTERECTOMY      Social History:  reports that she quit smoking about 30 years ago. Her smoking use included cigarettes. She has a 2.50 pack-year smoking history. She has never used smokeless tobacco. She reports that she does not drink alcohol and does not use drugs.  Family History:  Family History  Problem Relation Age of Onset  . Pulmonary embolism Mother   . Healthy Father      Prior to Admission medications   Medication Sig Start Date End Date Taking? Authorizing Provider  amLODipine (NORVASC) 5 MG tablet Take 1 tablet (5 mg total) by mouth daily. Patient taking differently: Take 5 mg by mouth daily.  07/04/19  Yes Juline Patch, MD  EUTHYROX 50 MCG tablet Take 50 mcg by mouth daily. 10/29/19  Yes [provider]  omeprazole (PRILOSEC) 20 MG capsule Take 1  capsule (20 mg total) by mouth daily. 07/04/19  Yes Juline Patch, MD    Physical Exam: Vitals:   01/01/20 0436 01/01/20 0641 01/01/20 0700 01/01/20 0745  BP: (!) 143/100 99/61 139/69 131/76  Pulse: 74 (!) 55 (!) 49 (!) 57  Resp: 12 12 13 15   Temp: 98 F (36.7 C)     TempSrc: Oral     SpO2: 95% 95% 97% 95%  Weight:      Height:       General: Not in acute distress HEENT:       Eyes: PERRL, EOMI, no scleral icterus.       ENT: No discharge from the ears and nose, no pharynx injection, no tonsillar enlargement.        Neck: No JVD, no bruit, no mass felt. Heme: No neck lymph node enlargement. Cardiac: M7/E7, RRR, 1/6 systolic murmur, no gallops or rubs. Respiratory:  No rales, wheezing, rhonchi or rubs. GI: Soft, nondistended, nontender, no rebound pain, no organomegaly, BS present. GU: No hematuria Ext: No pitting leg edema bilaterally. 1+DP/PT pulse bilaterally.  Musculoskeletal: No joint deformities, No joint redness or warmth, no limitation of ROM in spin. Skin: No rashes.  Neuro: Alert, oriented X3, cranial nerves II-XII grossly intact, moves all extremities normally. Psych: Patient is not psychotic, no suicidal or hemocidal ideation.  Labs on Admission: I have personally reviewed following labs and imaging studies  CBC: Recent Labs  Lab 01/01/20 0438  WBC 4.3  HGB 13.1  HCT 38.4  MCV 88.3  PLT 329*   Basic Metabolic Panel: Recent Labs  Lab 01/01/20 0438  NA 138  K 2.7*  CL 103  CO2 23  GLUCOSE 127*  BUN 13  CREATININE 0.82  CALCIUM 9.7   GFR: Estimated Creatinine Clearance: 56 mL/min (by C-G formula based on SCr of 0.82 mg/dL). Liver Function Tests: No results for input(s): AST, ALT, ALKPHOS, BILITOT, PROT, ALBUMIN in the last 168 hours. No results for input(s): LIPASE, AMYLASE in the last 168 hours. No results for input(s): AMMONIA in the last 168 hours. Coagulation Profile: No results for input(s): INR, PROTIME in the last 168 hours. Cardiac  Enzymes: No results for input(s): CKTOTAL, CKMB, CKMBINDEX, TROPONINI in the last 168 hours. BNP (last 3 results) No results for input(s): PROBNP in the last 8760 hours. HbA1C: No results for input(s): HGBA1C in the last 72 hours. CBG: No results for input(s): GLUCAP in the last 168 hours. Lipid Profile: No results for input(s): CHOL, HDL, LDLCALC, TRIG, CHOLHDL, LDLDIRECT in the last 72 hours. Thyroid Function Tests: No results for input(s): TSH, T4TOTAL, FREET4, T3FREE, THYROIDAB in the last 72 hours. Anemia Panel: No results for input(s): VITAMINB12, FOLATE, FERRITIN, TIBC, IRON, RETICCTPCT in the last 72 hours. Urine analysis:    Component Value Date/Time   COLORURINE COLORLESS (A) 01/01/2020 0622   APPEARANCEUR CLEAR (A) 01/01/2020 0622   LABSPEC 1.006 01/01/2020 0622   PHURINE 8.0 01/01/2020 0622   GLUCOSEU NEGATIVE 01/01/2020 0622   HGBUR NEGATIVE 01/01/2020 0622   BILIRUBINUR NEGATIVE 01/01/2020 0622   KETONESUR 5 (A) 01/01/2020 0622   PROTEINUR 100 (A) 01/01/2020 0622   NITRITE NEGATIVE 01/01/2020 0622   LEUKOCYTESUR NEGATIVE 01/01/2020 0622   Sepsis Labs: @LABRCNTIP (procalcitonin:4,lacticidven:4) ) Recent Results (from the past 240 hour(s))  Respiratory Panel by RT PCR (Flu A&B, Covid) - Nasopharyngeal Swab     Status: None   Collection Time: 01/01/20  6:22 AM   Specimen: Nasopharyngeal Swab  Result Value Ref Range Status   SARS Coronavirus 2 by RT PCR NEGATIVE NEGATIVE Final    Comment: (NOTE) SARS-CoV-2 target nucleic acids are NOT DETECTED.  The SARS-CoV-2 RNA is generally detectable in upper respiratoy specimens during the acute phase of infection. The lowest concentration of SARS-CoV-2 viral copies this assay can detect is 131 copies/mL. A negative result does not preclude SARS-Cov-2 infection and should not be used as the sole basis for treatment or other patient management decisions. A negative result may occur with  improper specimen  collection/handling, submission of specimen other than nasopharyngeal swab, presence of viral mutation(s) within the areas targeted by this assay, and inadequate number of viral copies (<131 copies/mL). A negative result must be combined with clinical observations, patient history, and epidemiological information. The expected result is Negative.  Fact Sheet for Patients:  PinkCheek.be  Fact Sheet for Healthcare Providers:  GravelBags.it  This test is no t yet approved or cleared by the [place] FDA and  has been authorized for detection and/or diagnosis of SARS-CoV-2 by FDA under an Emergency Use Authorization (EUA). This EUA will remain  in effect (meaning this test can be used) for the duration of the COVID-19 declaration under Section 564(b)(1) of the Act, 21 U.S.C. section 360bbb-3(b)(1), unless the authorization is terminated or revoked sooner.     Influenza A by PCR NEGATIVE NEGATIVE Final   Influenza B by PCR NEGATIVE NEGATIVE Final    Comment: (NOTE) The Xpert Xpress SARS-CoV-2/FLU/RSV assay is intended as an aid in  the diagnosis of influenza from Nasopharyngeal swab specimens and  should not be used as a sole basis for treatment. Nasal washings and  aspirates are unacceptable for Xpert Xpress SARS-CoV-2/FLU/RSV  testing.  Fact Sheet for Patients: PinkCheek.be  Fact Sheet for Healthcare Providers: GravelBags.it  This test is not yet approved or cleared by the [place] FDA and  has been authorized for detection and/or diagnosis of SARS-CoV-2 by  FDA under an Emergency Use Authorization (EUA). This EUA will remain  in effect (meaning this test can be used) for the duration of the  Covid-19 declaration under Section 564(b)(1) of the Act, 21  U.S.C. section 360bbb-3(b)(1), unless the authorization is  terminated or revoked. Performed at [facility], [address] [place], [place] [address]      Radiological Exams on Admission: DG Chest Portable 1 View  Result Date: 01/01/2020 CLINICAL DATA:  Chest pain EXAM: PORTABLE CHEST 1 VIEW COMPARISON:  11/29/2017 FINDINGS: Heart size and pulmonary vascularity are normal. Central peribronchial thickening suggesting airways disease or bronchitis. No focal consolidation. No pleural effusions. No pneumothorax. Mediastinal contours appear intact. Postoperative changes in the shoulders. IMPRESSION: Peribronchial thickening suggesting airways disease or bronchitis. No focal consolidation. Electronically Signed   By: Lucienne Capers M.D.   On: 01/01/2020 05:27     EKG: I have personally reviewed.  Sinus rhythm, QTC 441, LAE, LAD.  Assessment/Plan Principal Problem:   Chest pain Active Problems:   Essential (primary) hypertension   Gastroduodenal ulcer   Hypothyroidism   GERD (gastroesophageal reflux disease)   Thoracoabdominal aortic aneurysm (TAAA) (HCC)   Anxiety and depression   Hypokalemia   Atrial fibrillation, new onset (HCC)   Elevated troponin   CAD (coronary artery disease)   Chest pain, elevated trop and hx of CAD: trop 60, indicating possible NSTEMI. Pt has new onset atrial fibrillation with RVR up to 130-150s per report. Patient spontaneously converted to sinus rhythm, indicating possible demand ischemia secondary to atrial fibrillation. Dr. Saralyn Pilar of cardiology is consulted. Pt has hx of TAAA, will need to r/o aortic dissection.   - admit to progressive unit as inpatient - Trend Trop - Repeat EKG in the am  - prn Morphine - start lipitor 40 mg daily - will not give more ASA due to allergy - Risk factor stratification: will check FLP and A1C  - 2d echo - will get CTA to r/o disssection  - will not start IV heparin now since we need to r/o dissection  Atrial fibrillation, new onset ([place]): CHA2DS2-VASc Score is 5, needs oral anticoagulation, but pt is  already converted to NSR now. Will defer to cardiologist for this issue. Heart rate is well controlled now -will check TSH  Essential (primary) hypertension -Continue home amlodipine  Gastroduodenal ulcer and GERD -Protonix  Hypothyroidism -continue home Euthyrox  Thoracoabdominal aortic aneurysm (TAAA) (HCC) -Follow-up CT angiogram  Hypokalemia: K= 2.7   on admission. - Repleted with total of 80 mEQ of KCl - Check Mg level - Give 1 g of magnesium sulfate  Hx of depression and anxiety: Stable, no suicidal  or homicidal ideations. Not taking medications currently. -Observe closely    Addendum: CAT is negative for aortic dissection, but it showed several other findings, particularly two issues as below 1. Renal lesion: CTA showed small renal lesions of intermediate density, most likely hemorrhagic cyst though with area in the lower pole that raises the question of small solid lesion. This need to be f/u'ed with PCP 2. High density material in duodenum: CTA showed "Layering high density material in the distal duodenum matches the density of high-density material in the stomach on precontrast images. Duodenum not imaged on pre contrast assessment. Given that it matches the density is favored to represent ingested material" per radiologist.  CTA showed: 1. No sign of aortic dissection. With stable 4 cm ascending thoracic aortic dilation. 2. Extensive atheromatous plaque in the thoracic and abdominal aorta. Abundant calcification shows moderate to marked narrowing of the LEFT common iliac artery. 3. Cirrhosis with signs of mild portal hypertension. 4. Question mild proximal colitis. This pattern could be seen in portal colopathy, correlate with any signs of colitis. Colonic assessment limited by under distension. 5. Stable cystic lesions in the pancreas, unchanged since prior imaging, continued attention on follow-up is suggested. If no follow-up is planned 2 year follow-up could be  considered based on stability since 2019. 6. Layering high density material in the distal duodenum matches the density of high-density material in the stomach on precontrast images. Duodenum not imaged on pre contrast assessment. Given that it matches the density is favored to represent ingested material. Correlate with any clinical evidence of GI bleeding. 7. Small renal lesions of intermediate density, most likely hemorrhagic cyst though with area in the lower pole that raises the question of small solid lesion. Consider six-month follow-up with dedicated renal protocol or MRI. 8. RIGHT-sided nephrolithiasis and cortical scarring without hydronephrosis. 9. Aortic atherosclerosis. 10. A call is out to the referring provider to further discuss findings in the above case        DVT ppx: Lovenox sq Code Status: Partial code (I discussed with the patient in the presence of her husband, and explained the meaning of CODE STATUS, patient wants to be partial code, OK for CPR, but no intubation). Family Communication:   Yes, patient's husband at bed side Disposition Plan:  Anticipate discharge back to previous environment Consults called: Dr. Saralyn Pilar of cardiology Admission status:   progressive unit as inpt       Status is: Inpatient  Remains inpatient appropriate because:Inpatient level of care appropriate due to severity of illness.  Patient has multiple comorbidities, now presents with chest pain, atrial fibrillation with new onset A. Fib, also has positive troponin, hypokalemia.  Her presentation is highly complicated.  Patient is at high risk of deteriorating given her old age. Will need to be treated in hospital for at least 2 days.   Dispo: The patient is from: Home              Anticipated d/c is to: Home              Anticipated d/c date is: 2 days              Patient currently is not medically stable to d/c.          Date of Service 01/01/2020    Ivor Costa [facility]   If 7PM-7AM, please contact night-coverage [email or web address] 01/01/2020, 8:25 AM

## 2020-01-01 NOTE — ED Triage Notes (Signed)
Pt to ED via EMS from home. Pt states she was sleeping when she woke up first feeling pain in neck that then moved down into her chest. Pt has no cardiac hx, on ems arrival pt was in a fib with a rate of 130s, pt converted self to controled rate, NSR. No x of a fib. Pt now c/o central chest tightness.

## 2020-01-01 NOTE — Progress Notes (Signed)
*  PRELIMINARY RESULTS* Echocardiogram 2D Echocardiogram has been performed.  Kaitlyn Oliver 01/01/2020, 2:20 PM

## 2020-01-02 DIAGNOSIS — I10 Essential (primary) hypertension: Secondary | ICD-10-CM

## 2020-01-02 DIAGNOSIS — E039 Hypothyroidism, unspecified: Secondary | ICD-10-CM

## 2020-01-02 DIAGNOSIS — F419 Anxiety disorder, unspecified: Secondary | ICD-10-CM | POA: Diagnosis not present

## 2020-01-02 DIAGNOSIS — R778 Other specified abnormalities of plasma proteins: Secondary | ICD-10-CM

## 2020-01-02 DIAGNOSIS — R079 Chest pain, unspecified: Secondary | ICD-10-CM | POA: Diagnosis not present

## 2020-01-02 DIAGNOSIS — I4891 Unspecified atrial fibrillation: Secondary | ICD-10-CM | POA: Diagnosis not present

## 2020-01-02 DIAGNOSIS — R69 Illness, unspecified: Secondary | ICD-10-CM | POA: Diagnosis not present

## 2020-01-02 DIAGNOSIS — R002 Palpitations: Secondary | ICD-10-CM | POA: Diagnosis not present

## 2020-01-02 DIAGNOSIS — R7989 Other specified abnormal findings of blood chemistry: Secondary | ICD-10-CM | POA: Diagnosis not present

## 2020-01-02 DIAGNOSIS — I480 Paroxysmal atrial fibrillation: Secondary | ICD-10-CM | POA: Diagnosis not present

## 2020-01-02 DIAGNOSIS — F32A Depression, unspecified: Secondary | ICD-10-CM

## 2020-01-02 LAB — ECHOCARDIOGRAM COMPLETE
AR max vel: 1.98 cm2
AV Area VTI: 2.15 cm2
AV Area mean vel: 1.97 cm2
AV Mean grad: 4 mmHg
AV Peak grad: 7.3 mmHg
Ao pk vel: 1.35 m/s
Area-P 1/2: 1.84 cm2
Height: 65 in
S' Lateral: 2.64 cm
Weight: 3104 oz

## 2020-01-02 LAB — CBC
HCT: 35.4 % — ABNORMAL LOW (ref 36.0–46.0)
Hemoglobin: 11.9 g/dL — ABNORMAL LOW (ref 12.0–15.0)
MCH: 30.5 pg (ref 26.0–34.0)
MCHC: 33.6 g/dL (ref 30.0–36.0)
MCV: 90.8 fL (ref 80.0–100.0)
Platelets: 150 10*3/uL (ref 150–400)
RBC: 3.9 MIL/uL (ref 3.87–5.11)
RDW: 15.6 % — ABNORMAL HIGH (ref 11.5–15.5)
WBC: 5.1 10*3/uL (ref 4.0–10.5)
nRBC: 0 % (ref 0.0–0.2)

## 2020-01-02 LAB — HEMOGLOBIN A1C
Hgb A1c MFr Bld: 5.6 % (ref 4.8–5.6)
Mean Plasma Glucose: 114 mg/dL

## 2020-01-02 LAB — TROPONIN I (HIGH SENSITIVITY)
Troponin I (High Sensitivity): 96 ng/L — ABNORMAL HIGH (ref <18)
Troponin I (High Sensitivity): 105 ng/L (ref <18)
Troponin I (High Sensitivity): 95 ng/L — ABNORMAL HIGH (ref <18)

## 2020-01-02 LAB — BASIC METABOLIC PANEL
Anion gap: 10 (ref 5–15)
BUN: 11 mg/dL (ref 8–23)
CO2: 24 mmol/L (ref 22–32)
Calcium: 9.2 mg/dL (ref 8.9–10.3)
Chloride: 107 mmol/L (ref 98–111)
Creatinine, Ser: 0.84 mg/dL (ref 0.44–1.00)
GFR, Estimated: >60 mL/min (ref >60)
Glucose, Bld: 104 mg/dL — ABNORMAL HIGH (ref 70–99)
Potassium: 3.5 mmol/L (ref 3.5–5.1)
Sodium: 141 mmol/L (ref 135–145)

## 2020-01-02 MED ORDER — LISINOPRIL 5 MG PO TABS
5.0000 mg | ORAL_TABLET | Freq: Every day | ORAL | Status: DC
  Administered 2020-01-02: 5 mg via ORAL
  Filled 2020-01-02: qty 1

## 2020-01-02 MED ORDER — ATORVASTATIN CALCIUM 40 MG PO TABS
40.0000 mg | ORAL_TABLET | Freq: Every day | ORAL | 0 refills | Status: DC
Start: 2020-01-03 — End: 2020-01-06

## 2020-01-02 MED ORDER — LISINOPRIL 5 MG PO TABS
5.0000 mg | ORAL_TABLET | Freq: Every day | ORAL | 1 refills | Status: DC
Start: 2020-01-03 — End: 2020-03-13

## 2020-01-02 NOTE — Discharge Summary (Signed)
Physician Discharge Summary  [name] NLZ:[id number] DOB: 1933-08-18 DOA: 01/01/2020  PCP: Juline Patch, MD  Admit date: 01/01/2020 Discharge date: 01/02/2020  Admitted From: Home Disposition: Home  Recommendations for Outpatient Follow-up:  1. Follow up with PCP in 1-2 weeks 2. Follow-up with cardiology 3. Please obtain BMP/CBC in one week 4. Please follow up on the following pending results: None  Home Health: No Equipment/Devices: None Discharge Condition: Stable CODE STATUS:  Diet recommendation: Heart Healthy / Carb Modified   Brief/Interim Summary: HPI: Kaitlyn Oliver is a 86 y.o. 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  Patient states that she was awoken from sleep by chest pain around 3:00 AM. Pt states that initially she had neck pain, then it moved down to her chest. Currently the chest pain is located in the central chest, initially 10 out of 10 severity, currently 3 out of 10 severity, dull. Patient does not have shortness of breath, cough, fever or chills. She states that she took full dose of aspirin at  home, but states that she is allergic to aspirin, refused to take more aspirin.  Per EMS, patient was noted to have atrial fibrillation with RVR, heart rate 130-150s.   Patient had diaphoresis.  In route to the hospital the patient's spontaneouslyconverted back to a normal sinus rhythm.  No history of atrial fibrillation. Per report, patient had low blood pressure after received nitroglycerin sublingually.  She was normotensive on arrival. Patient remained in sinus rhythm during hospitalization.  Borderline elevation of troponin.  Remained chest pain-free.  Troponin peaked at 105.  No recurrence of A. fib.   Cardiology was consulted and she was provided with a 1 week monitor and will follow up with cardiology as an outpatient.  No need for anticoagulation at this time as patient remained in sinus rhythm during current hospitalization.  Cardiology will decide as an outpatient if there is any recurrence of A. fib.  Echocardiogram was within normal limits.  Patient was found to have hypokalemia which can also trigger abnormal rhythm.  Magnesium was within lower normal limit.  Electrolytes were replaced.  She was also started on lisinopril by cardiology along with Lipitor.  Patient also has mildly elevated TSH.  PCP should be able to follow-up and titrate her dose of Synthroid accordingly.  She will continue with rest of her home meds and follow-up with her providers.  Discharge Diagnoses:  Principal Problem:   Chest pain Active Problems:   Essential (primary) hypertension   Gastroduodenal ulcer   Hypothyroidism   GERD (gastroesophageal reflux disease)   Thoracoabdominal aortic aneurysm (TAAA) (HCC)   Anxiety and depression   Hypokalemia   Atrial fibrillation, new onset (HCC)   Elevated troponin   CAD (coronary artery disease)   Discharge Instructions  Discharge Instructions    Diet - low sodium heart healthy   Complete by: As directed    Discharge instructions   Complete by: As directed    It was pleasure taking care of you. Your potassium was little low as it can also trigger abnormal rhythm.  You are being started on lisinopril by your cardiologist which will help with your potassium level and blood pressure. You can also try eating potassium enriched foods like bananas and dried apricots. You're also being started on cholesterol medication by your cardiologist.  Please follow-up with cardiologist according to your appointment in 1 to 2 weeks.   Increase activity slowly   Complete by: As directed      Allergies as of 01/02/2020      Reactions   Adhesive [tape] Dermatitis   Can  not use plastic tape. Paper tape ok    Asa [aspirin]    Itchy   Penicillin G Itching   Has patient had a PCN reaction causing immediate rash, facial/tongue/throat swelling, SOB or lightheadedness with hypotension: No Has patient had a PCN reaction causing severe rash involving mucus membranes or skin necrosis: No Has patient had a PCN reaction that required hospitalization: No Has patient had a PCN reaction occurring within the last 10 years: No If all of the above answers are "NO", then may proceed with Cephalosporin use.   Hydrocodone-acetaminophen Itching, Rash      Medication List    TAKE these medications   amLODipine 5 MG tablet Commonly known as: NORVASC Take 1 tablet (5 mg total) by mouth daily.   atorvastatin 40 MG tablet Commonly known as: LIPITOR Take 1 tablet (40 mg total) by mouth daily. Start taking on: January 03, 2020   Euthyrox 50 MCG tablet Generic drug: levothyroxine Take 50 mcg by mouth daily.   lisinopril 5 MG tablet Commonly known as: ZESTRIL Take 1 tablet (5 mg total) by mouth daily. Start taking on: January 03, 2020   omeprazole 20 MG capsule Commonly known as: PRILOSEC Take 1 capsule (20 mg total) by mouth daily.       Follow-up Information    [name], Alexander, MD. Go in 2 week(s).   Specialty: Cardiology Contact information: [facility] [place] [address] [phone]        Juline Patch, MD. Schedule an appointment as soon as possible for a visit in 1 week(s).   Specialty: Family Medicine Contact information: [address] [place] [address] [phone]              Allergies  Allergen Reactions  . Adhesive [Tape] Dermatitis    Can not use plastic tape. Paper tape ok   . Asa [Aspirin]     Itchy   . Penicillin G Itching    Has patient had a PCN reaction causing immediate rash, facial/tongue/throat swelling, SOB or lightheadedness with hypotension: No Has  patient had a PCN reaction causing severe rash involving mucus membranes or skin necrosis: No Has patient had a PCN reaction that required hospitalization: No Has patient had a PCN reaction occurring within the last 10 years: No If all of the above answers are "NO", then may proceed with Cephalosporin use.   Marland Kitchen Hydrocodone-Acetaminophen Itching and Rash    Consultations:  Cardiology  Procedures/Studies: DG Chest Portable 1 View  Result Date: 01/01/2020 CLINICAL DATA:  Chest pain EXAM: PORTABLE CHEST 1 VIEW COMPARISON:  11/29/2017 FINDINGS: Heart size and pulmonary vascularity are normal. Central peribronchial thickening suggesting airways disease or bronchitis. No focal consolidation. No pleural effusions. No pneumothorax. Mediastinal contours appear intact. Postoperative changes in the shoulders. IMPRESSION: Peribronchial thickening suggesting airways disease or bronchitis. No focal consolidation. Electronically Signed   By: Lucienne Capers M.D.   On: 01/01/2020 05:27   ECHOCARDIOGRAM COMPLETE  Result Date: 01/02/2020    ECHOCARDIOGRAM REPORT   Patient Name:   Kaitlyn Oliver [facility] Date of Exam: 01/01/2020 Medical Rec #:  [id number]           Height:  65.0 in Accession #:    [id number]          Weight:       194.0 lb Date of Birth:  13-Dec-1933            BSA:          1.953 m Patient Age:    86 years            BP:           160/80 mmHg Patient Gender: F                   HR:           59 bpm. Exam Location:  [facility] Procedure: 2D Echo, Cardiac Doppler and Color Doppler Indications:     Atrial Fibrillation 427.31  History:         Patient has no prior history of Echocardiogram examinations.                  Risk Factors:Hypertension.  Sonographer:     Sherrie Sport RDCS (AE) Referring Phys:  [id number] Clabe Seal Diagnosing Phys: Serafina Royals MD IMPRESSIONS  1. Left ventricular ejection fraction, by estimation, is 60 to 65%. The left ventricle has normal function. The left ventricle has no regional  wall motion abnormalities. Left ventricular diastolic parameters were normal.  2. Right ventricular systolic function is normal. The right ventricular size is normal.  3. The mitral valve is normal in structure. Trivial mitral valve regurgitation.  4. The aortic valve is normal in structure. Aortic valve regurgitation is not visualized. FINDINGS  Left Ventricle: Left ventricular ejection fraction, by estimation, is 60 to 65%. The left ventricle has normal function. The left ventricle has no regional wall motion abnormalities. The left ventricular internal cavity size was normal in size. There is  no left ventricular hypertrophy. Left ventricular diastolic parameters were normal. Right Ventricle: The right ventricular size is normal. No increase in right ventricular wall thickness. Right ventricular systolic function is normal. Left Atrium: Left atrial size was normal in size. Right Atrium: Right atrial size was normal in size. Pericardium: There is no evidence of pericardial effusion. Mitral Valve: The mitral valve is normal in structure. Trivial mitral valve regurgitation. Tricuspid Valve: The tricuspid valve is normal in structure. Tricuspid valve regurgitation is trivial. Aortic Valve: The aortic valve is normal in structure. Aortic valve regurgitation is not visualized. Aortic valve mean gradient measures 4.0 mmHg. Aortic valve peak gradient measures 7.3 mmHg. Aortic valve area, by VTI measures 2.15 cm. Pulmonic Valve: The pulmonic valve was normal in structure. Pulmonic valve regurgitation is not visualized. Aorta: The aortic root and ascending aorta are structurally normal, with no evidence of dilitation. IAS/Shunts: No atrial level shunt detected by color flow Doppler.  LEFT VENTRICLE PLAX 2D LVIDd:         4.84 cm  Diastology LVIDs:         2.64 cm  LV e' medial:    4.46 cm/s LV PW:         1.01 cm  LV E/e' medial:  15.7 LV IVS:        1.21 cm  LV e' lateral:   4.79 cm/s LVOT diam:     2.00 cm  LV E/e'  lateral: 14.6 LV SV:         65 LV SV Index:   33 LVOT Area:     3.14 cm  RIGHT VENTRICLE RV Basal diam:  4.24 cm  RV S prime:     15.20 cm/s TAPSE (M-mode): 3.6 cm LEFT ATRIUM           Index       RIGHT ATRIUM           Index LA diam:      4.50 cm 2.30 cm/m  RA Area:     18.90 cm LA Vol (A2C): 85.9 ml 43.99 ml/m RA Volume:   49.90 ml  25.55 ml/m LA Vol (A4C): 21.7 ml 11.11 ml/m  AORTIC VALVE                   PULMONIC VALVE AV Area (Vmax):    1.98 cm    PV Vmax:        0.82 m/s AV Area (Vmean):   1.97 cm    PV Peak grad:   2.7 mmHg AV Area (VTI):     2.15 cm    RVOT Peak grad: 3 mmHg AV Vmax:           134.67 cm/s AV Vmean:          88.500 cm/s AV VTI:            0.301 m AV Peak Grad:      7.3 mmHg AV Mean Grad:      4.0 mmHg LVOT Vmax:         85.00 cm/s LVOT Vmean:        55.400 cm/s LVOT VTI:          0.206 m LVOT/AV VTI ratio: 0.68  AORTA Ao Root diam: 3.10 cm MITRAL VALVE                TRICUSPID VALVE MV Area (PHT): 1.84 cm     TR Peak grad:   26.4 mmHg MV Decel Time: 412 msec     TR Vmax:        257.00 cm/s MV E velocity: 69.80 cm/s MV A velocity: 108.00 cm/s  SHUNTS MV E/A ratio:  0.65         Systemic VTI:  0.21 m                             Systemic Diam: 2.00 cm Serafina Royals MD Electronically signed by Serafina Royals MD Signature Date/Time: 01/02/2020/12:25:16 PM    Final    CT Angio Chest/Abd/Pel for Dissection W and/or W/WO  Addendum Date: 01/01/2020   ADDENDUM REPORT: 01/01/2020 10:25 ADDENDUM: These results were called by telephone at the time of interpretation on 01/01/2020 at 10:25 am to provider Ivor Costa , who verbally acknowledged these results. Electronically Signed   By: Zetta Bills M.D.   On: 01/01/2020 10:25   Result Date: 01/01/2020 CLINICAL DATA:  Chest pain, back pain, suspicious for aortic dissection EXAM: CT ANGIOGRAPHY CHEST, ABDOMEN AND PELVIS TECHNIQUE: Non-contrast CT of the chest was initially obtained. Multidetector CT imaging through the chest, abdomen and  pelvis was performed using the standard protocol during bolus administration of intravenous contrast. Multiplanar reconstructed images and MIPs were obtained and reviewed to evaluate the vascular anatomy. CONTRAST:  110mL OMNIPAQUE IOHEXOL 350 MG/ML SOLN COMPARISON:  May 22, 2019 FINDINGS: CTA CHEST FINDINGS Cardiovascular: Calcified atheromatous plaque in the thoracic aorta. Three-vessel coronary artery disease. Mitral annular and aortic valvular calcifications with similar appearance to the prior study. Aortic caliber 4.0 cm. No visible intramural hematoma on noncontrast imaging. Heart size is enlarged, mildly enlarged similar  to the previous exam. Aorta with smooth contour with postcontrast images displaying no sign of aortic dissection. No pericardial effusion. Central pulmonary vasculature not well opacified but grossly unremarkable. Mediastinum/Nodes: Thoracic inlet structures are normal. No axillary lymphadenopathy. No mediastinal adenopathy. No hilar adenopathy. Esophagus grossly normal. Lungs/Pleura: Subpleural reticulation. This is mild and unchanged and mainly at the lung apices with mild apical pleural and parenchymal scarring. No consolidative changes. No pleural effusion. (Image 76, series 7) 5 mm lingular nodule is stable. (Image 85, series 7) small mildly spiculated nodule in the LEFT lung base in the LEFT lower lobe measuring 6 x 4 mm is also stable. This nodule is unchanged dating back to June of 2020 as is the lingular nodule. Airways are patent. Musculoskeletal: No chest wall mass, see below for full musculoskeletal details. Review of the MIP images confirms the above findings. CTA ABDOMEN AND PELVIS FINDINGS VASCULAR Aorta: Aortic caliber at the aortic hiatus 3.0 x 2.9 cm. Aorta with calcified and noncalcified atheromatous plaque measuring 2.4 x 2.3 cm in the infrarenal segment. No perivascular stranding. Extensive atheromatous plaque extends into the iliac vessels and into femoral vessels.  Abundant calcification shows moderate to marked narrowing of the LEFT common iliac artery. Runoff into the proximal thigh is maintained. Celiac: Mild atheromatous plaque without significant narrowing of the celiac axis. Normal branching pattern of the celiac. SMA: SMA also patent with mild atheromatous changes. Renals: Renal arteries with signs of atheromatous plaque without flow-limiting narrowing. Single renal arteries bilaterally. IMA: IMA is patent. Inflow: Patent without evidence of aneurysm, dissection, vasculitis or significant stenosis. Veins: Normal caliber and contour the IVC. Venous structures not well assessed due to arterial phase acquisition. Review of the MIP images confirms the above findings. NON-VASCULAR Hepatobiliary: Liver displays a cirrhotic morphology, limited assessment on immediate arterial phase. No focal, suspicious hepatic lesion. Pancreas: Pancreas with small cystic lesion measuring 13 mm in the head of the pancreas and another small cystic lesion measuring 12 mm in the neck of the pancreas. These display water density and are not associated with main duct dilation. Size is stable compared to March of 2021 Spleen: Spleen mildly enlarged unchanged from previous imaging. Adrenals/Urinary Tract: Adrenal glands are normal. Renal cortical scarring worse on the RIGHT. RIGHT-sided nephrolithiasis is similar to the prior study, 2 mm calculus in the interpolar RIGHT kidney. No hydronephrosis. Small low-density lesion arising from the anterior LEFT kidney intermediate density approximately 38 Hounsfield units unchanged with respect to size compared to previous imaging. Higher density lesion arises from the lower pole of the LEFT kidney (image 122 of series 5) 9 mm nearly isodense to adjacent medulla. Well-marginated cystic lesion in the upper pole showed density values on portal venous phase less than 30 Hounsfield unit but with higher density on the current study approximately 45 Hounsfield  units. (Image 102, series 5) 13 mm. Stomach/Bowel: Some high-density material within the GI tract at baseline. Only the proximal GI tract is visualized on the noncontrast data set but this shows high-density material in the stomach, presumably other areas of high-density material in the proximal small bowel are related to this finding. Moderately large duodenal diverticulum arises at the ligament of Treitz measuring 5 x 4.8 cm no surrounding stranding, similar appearance to prior imaging. Query mild thickening of the hepatic flexure of the colon and ascending colon through the mid transverse colon. Pancolonic diverticulosis. Lymphatic: There is no gastrohepatic or hepatoduodenal ligament lymphadenopathy. No retroperitoneal or mesenteric lymphadenopathy. Reproductive: Post hysterectomy.  No adnexal mass.  Other: No ascites.  No free air.  Midline postoperative changes. Musculoskeletal: No acute bone finding. No destructive bone process. Review of the MIP images confirms the above findings. IMPRESSION: 1. No sign of aortic dissection. With stable 4 cm ascending thoracic aortic dilation. 2. Extensive atheromatous plaque in the thoracic and abdominal aorta. Abundant calcification shows moderate to marked narrowing of the LEFT common iliac artery. 3. Cirrhosis with signs of mild portal hypertension. 4. Question mild proximal colitis. This pattern could be seen in portal colopathy, correlate with any signs of colitis. Colonic assessment limited by under distension. 5. Stable cystic lesions in the pancreas, unchanged since prior imaging, continued attention on follow-up is suggested. If no follow-up is planned 2 year follow-up could be considered based on stability since 2019. 6. Layering high density material in the distal duodenum matches the density of high-density material in the stomach on precontrast images. Duodenum not imaged on pre contrast assessment. Given that it matches the density is favored to represent  ingested material. Correlate with any clinical evidence of GI bleeding. 7. Small renal lesions of intermediate density, most likely hemorrhagic cyst though with area in the lower pole that raises the question of small solid lesion. Consider six-month follow-up with dedicated renal protocol or MRI. 8. RIGHT-sided nephrolithiasis and cortical scarring without hydronephrosis. 9. Aortic atherosclerosis. 10. A call is out to the referring provider to further discuss findings in the above case. Aortic Atherosclerosis (ICD10-I70.0). Electronically Signed: By: Zetta Bills M.D. On: 01/01/2020 10:11     Subjective: Patient was feeling better when seen during morning rounds.  Denies any chest pain, shortness of breath, palpitation, nausea or vomiting.  Per patient she never had any cardiac problems before.  She wants to go home.  Accompanied by her husband in the room.  Discharge Exam: Vitals:   01/02/20 0718 01/02/20 1052  BP: (!) 178/92 (!) 163/81  Pulse: (!) 58 60  Resp: 18 18  Temp: 97.7 F (36.5 C) 98.1 F (36.7 C)  SpO2: 97% 94%   Vitals:   01/01/20 2037 01/02/20 0350 01/02/20 0718 01/02/20 1052  BP:  (!) 169/83 (!) 178/92 (!) 163/81  Pulse:  (!) 58 (!) 58 60  Resp:  19 18 18   Temp:  98.7 F (37.1 C) 97.7 F (36.5 C) 98.1 F (36.7 C)  TempSrc:  Oral Oral Oral  SpO2:  97% 97% 94%  Weight: 84.5 kg     Height: 5\' 5"  (1.651 m)       General: Pt is alert, awake, not in acute distress Cardiovascular: RRR, S1/S2 +, no rubs, no gallops Respiratory: CTA bilaterally, no wheezing, no rhonchi Abdominal: Soft, NT, ND, bowel sounds + Extremities: no edema, no cyanosis   The results of significant diagnostics from this hospitalization (including imaging, microbiology, ancillary and laboratory) are listed below for reference.    Microbiology: Recent Results (from the past 240 hour(s))  Respiratory Panel by RT PCR (Flu A&B, Covid) - Nasopharyngeal Swab     Status: None   Collection Time:  01/01/20  6:22 AM   Specimen: Nasopharyngeal Swab  Result Value Ref Range Status   SARS Coronavirus 2 by RT PCR NEGATIVE NEGATIVE Final    Comment: (NOTE) SARS-CoV-2 target nucleic acids are NOT DETECTED.  The SARS-CoV-2 RNA is generally detectable in upper respiratoy specimens during the acute phase of infection. The lowest concentration of SARS-CoV-2 viral copies this assay can detect is 131 copies/mL. A negative result does not preclude SARS-Cov-2 infection and should not  be used as the sole basis for treatment or other patient management decisions. A negative result may occur with  improper specimen collection/handling, submission of specimen other than nasopharyngeal swab, presence of viral mutation(s) within the areas targeted by this assay, and inadequate number of viral copies (<131 copies/mL). A negative result must be combined with clinical observations, patient history, and epidemiological information. The expected result is Negative.  Fact Sheet for Patients:  PinkCheek.be  Fact Sheet for Healthcare Providers:  GravelBags.it  This test is no t yet approved or cleared by the [place] FDA and  has been authorized for detection and/or diagnosis of SARS-CoV-2 by FDA under an Emergency Use Authorization (EUA). This EUA will remain  in effect (meaning this test can be used) for the duration of the COVID-19 declaration under Section 564(b)(1) of the Act, 21 U.S.C. section 360bbb-3(b)(1), unless the authorization is terminated or revoked sooner.     Influenza A by PCR NEGATIVE NEGATIVE Final   Influenza B by PCR NEGATIVE NEGATIVE Final    Comment: (NOTE) The Xpert Xpress SARS-CoV-2/FLU/RSV assay is intended as an aid in  the diagnosis of influenza from Nasopharyngeal swab specimens and  should not be used as a sole basis for treatment. Nasal washings and  aspirates are unacceptable for Xpert Xpress  SARS-CoV-2/FLU/RSV  testing.  Fact Sheet for Patients: PinkCheek.be  Fact Sheet for Healthcare Providers: GravelBags.it  This test is not yet approved or cleared by the [place] FDA and  has been authorized for detection and/or diagnosis of SARS-CoV-2 by  FDA under an Emergency Use Authorization (EUA). This EUA will remain  in effect (meaning this test can be used) for the duration of the  Covid-19 declaration under Section 564(b)(1) of the Act, 21  U.S.C. section 360bbb-3(b)(1), unless the authorization is  terminated or revoked. Performed at [facility], [address] [place], [place] [address]      Labs: BNP (last 3 results) No results for input(s): BNP in the last 8760 hours. Basic Metabolic Panel: Recent Labs  Lab 01/01/20 0438 01/01/20 0632 01/02/20 0452  NA 138  --  141  K 2.7*  --  3.5  CL 103  --  107  CO2 23  --  24  GLUCOSE 127*  --  104*  BUN 13  --  11  CREATININE 0.82  --  0.84  CALCIUM 9.7  --  9.2  MG  --  1.7  --    Liver Function Tests: No results for input(s): AST, ALT, ALKPHOS, BILITOT, PROT, ALBUMIN in the last 168 hours. No results for input(s): LIPASE, AMYLASE in the last 168 hours. No results for input(s): AMMONIA in the last 168 hours. CBC: Recent Labs  Lab 01/01/20 0438 01/02/20 0452  WBC 4.3 5.1  HGB 13.1 11.9*  HCT 38.4 35.4*  MCV 88.3 90.8  PLT 141* 150   Cardiac Enzymes: No results for input(s): CKTOTAL, CKMB, CKMBINDEX, TROPONINI in the last 168 hours. BNP: Invalid input(s): POCBNP CBG: No results for input(s): GLUCAP in the last 168 hours. D-Dimer No results for input(s): DDIMER in the last 72 hours. Hgb A1c Recent Labs    01/01/20 1431  HGBA1C 5.6   Lipid Profile No results for input(s): CHOL, HDL, LDLCALC, TRIG, CHOLHDL, LDLDIRECT in the last 72 hours. Thyroid function studies Recent Labs    01/01/20 0632  TSH 4.658*   Anemia work  up No results for input(s): VITAMINB12, FOLATE, FERRITIN, TIBC, IRON, RETICCTPCT in the last 43  hours. Urinalysis    Component Value Date/Time   COLORURINE COLORLESS (A) 01/01/2020 0622   APPEARANCEUR CLEAR (A) 01/01/2020 0622   LABSPEC 1.006 01/01/2020 0622   PHURINE 8.0 01/01/2020 0622   GLUCOSEU NEGATIVE 01/01/2020 0622   HGBUR NEGATIVE 01/01/2020 0622   BILIRUBINUR NEGATIVE 01/01/2020 0622   KETONESUR 5 (A) 01/01/2020 0622   PROTEINUR 100 (A) 01/01/2020 0622   NITRITE NEGATIVE 01/01/2020 0622   LEUKOCYTESUR NEGATIVE 01/01/2020 0622   Sepsis Labs Invalid input(s): PROCALCITONIN,  WBC,  LACTICIDVEN Microbiology Recent Results (from the past 240 hour(s))  Respiratory Panel by RT PCR (Flu A&B, Covid) - Nasopharyngeal Swab     Status: None   Collection Time: 01/01/20  6:22 AM   Specimen: Nasopharyngeal Swab  Result Value Ref Range Status   SARS Coronavirus 2 by RT PCR NEGATIVE NEGATIVE Final    Comment: (NOTE) SARS-CoV-2 target nucleic acids are NOT DETECTED.  The SARS-CoV-2 RNA is generally detectable in upper respiratoy specimens during the acute phase of infection. The lowest concentration of SARS-CoV-2 viral copies this assay can detect is 131 copies/mL. A negative result does not preclude SARS-Cov-2 infection and should not be used as the sole basis for treatment or other patient management decisions. A negative result may occur with  improper specimen collection/handling, submission of specimen other than nasopharyngeal swab, presence of viral mutation(s) within the areas targeted by this assay, and inadequate number of viral copies (<131 copies/mL). A negative result must be combined with clinical observations, patient history, and epidemiological information. The expected result is Negative.  Fact Sheet for Patients:  PinkCheek.be  Fact Sheet for Healthcare Providers:  GravelBags.it  This test is no t yet  approved or cleared by the [place] FDA and  has been authorized for detection and/or diagnosis of SARS-CoV-2 by FDA under an Emergency Use Authorization (EUA). This EUA will remain  in effect (meaning this test can be used) for the duration of the COVID-19 declaration under Section 564(b)(1) of the Act, 21 U.S.C. section 360bbb-3(b)(1), unless the authorization is terminated or revoked sooner.     Influenza A by PCR NEGATIVE NEGATIVE Final   Influenza B by PCR NEGATIVE NEGATIVE Final    Comment: (NOTE) The Xpert Xpress SARS-CoV-2/FLU/RSV assay is intended as an aid in  the diagnosis of influenza from Nasopharyngeal swab specimens and  should not be used as a sole basis for treatment. Nasal washings and  aspirates are unacceptable for Xpert Xpress SARS-CoV-2/FLU/RSV  testing.  Fact Sheet for Patients: PinkCheek.be  Fact Sheet for Healthcare Providers: GravelBags.it  This test is not yet approved or cleared by the [place] FDA and  has been authorized for detection and/or diagnosis of SARS-CoV-2 by  FDA under an Emergency Use Authorization (EUA). This EUA will remain  in effect (meaning this test can be used) for the duration of the  Covid-19 declaration under Section 564(b)(1) of the Act, 21  U.S.C. section 360bbb-3(b)(1), unless the authorization is  terminated or revoked. Performed at [facility], [address] [place], [place] [address]     Time coordinating discharge: Over 30 minutes  SIGNED:  Lorella Nimrod, MD  [facility] 01/02/2020, 1:26 PM  If 7PM-7AM, please contact night-coverage [email or web address]  This record has been created using Systems analyst. Errors have been sought and corrected,but may not always be located. Such creation errors do not reflect on the standard of care.

## 2020-01-02 NOTE — Care Management Obs Status (Signed)
[place] NOTIFICATION   Patient Details  Name: Lolah Coghlan MRN: [id number] Date of Birth: Oct 31, 1933   Medicare Observation Status Notification Given:  Yes    Caira Poche A Nikia Mangino, LCSW 01/02/2020, 1:43 PM

## 2020-01-02 NOTE — Progress Notes (Signed)
OT Cancellation Note  Patient Details Name: Kaitlyn Oliver MRN: [id number] DOB: July 05, 1933   Cancelled Treatment:    Reason Eval/Treat Not Completed: OT screened, no needs identified, will sign off  Upon chart review and in speaking with patient/observing pt, she is noted to be performing mobility and self care at her reported baseline with no AD and no assistance. Do not detect any acute OT needs at this time, nor need for OT f/u upon d/c from acute setting. Thank you.  Gerrianne Scale, [place], OTR/L ascom [phone] 01/02/20, 2:35 PM

## 2020-01-02 NOTE — Progress Notes (Signed)
[facility] Cardiology    SUBJECTIVE: The patient reports feeling "great," and is eager to go home. She denies recurrent chest pain since yesterday, denies shortness of breath or palpitations.    Vitals:   01/01/20 2029 01/01/20 2037 01/02/20 0350 01/02/20 0718  BP: (!) 177/86  (!) 169/83 (!) 178/92  Pulse: 62  (!) 58 (!) 58  Resp: 19  19 18   Temp: (!) 97.5 F (36.4 C)  98.7 F (37.1 C) 97.7 F (36.5 C)  TempSrc: Oral  Oral Oral  SpO2: 97%  97% 97%  Weight:  84.5 kg    Height:  5\' 5"  (1.651 m)       Intake/Output Summary (Last 24 hours) at 01/02/2020 6644 Last data filed at 01/02/2020 0347 Gross per 24 hour  Intake 1281.14 ml  Output 1500 ml  Net -218.86 ml      PHYSICAL EXAM  General: Well developed, well nourished, in no acute distress, lying supine in bed HEENT:  Normocephalic and atramatic Neck:  No JVD.  Lungs: normal effort of breathing on room air. Heart: HRRR . Normal S1 and S2 without gallops or murmurs.  Abdomen: nondistended Msk:  Back normal, gait not assessed. Normal strength and tone for age. Extremities: No clubbing, cyanosis or edema.   Neuro: Alert and oriented X 3. Psych:  Good affect, responds appropriately   LABS: Basic Metabolic Panel: Recent Labs    01/01/20 0438 01/01/20 0632 01/02/20 0452  NA 138  --  141  K 2.7*  --  3.5  CL 103  --  107  CO2 23  --  24  GLUCOSE 127*  --  104*  BUN 13  --  11  CREATININE 0.82  --  0.84  CALCIUM 9.7  --  9.2  MG  --  1.7  --    Liver Function Tests: No results for input(s): AST, ALT, ALKPHOS, BILITOT, PROT, ALBUMIN in the last 72 hours. No results for input(s): LIPASE, AMYLASE in the last 72 hours. CBC: Recent Labs    01/01/20 0438 01/02/20 0452  WBC 4.3 5.1  HGB 13.1 11.9*  HCT 38.4 35.4*  MCV 88.3 90.8  PLT 141* 150   Cardiac Enzymes: No results for input(s): CKTOTAL, CKMB, CKMBINDEX, TROPONINI in the last 72 hours. BNP: Invalid input(s): POCBNP D-Dimer: No results for input(s):  DDIMER in the last 72 hours. Hemoglobin A1C: Recent Labs    01/01/20 1431  HGBA1C 5.6   Fasting Lipid Panel: No results for input(s): CHOL, HDL, LDLCALC, TRIG, CHOLHDL, LDLDIRECT in the last 72 hours. Thyroid Function Tests: Recent Labs    01/01/20 0632  TSH 4.658*   Anemia Panel: No results for input(s): VITAMINB12, FOLATE, FERRITIN, TIBC, IRON, RETICCTPCT in the last 72 hours.  DG Chest Portable 1 View  Result Date: 01/01/2020 CLINICAL DATA:  Chest pain EXAM: PORTABLE CHEST 1 VIEW COMPARISON:  11/29/2017 FINDINGS: Heart size and pulmonary vascularity are normal. Central peribronchial thickening suggesting airways disease or bronchitis. No focal consolidation. No pleural effusions. No pneumothorax. Mediastinal contours appear intact. Postoperative changes in the shoulders. IMPRESSION: Peribronchial thickening suggesting airways disease or bronchitis. No focal consolidation. Electronically Signed   By: Lucienne Capers M.D.   On: 01/01/2020 05:27   CT Angio Chest/Abd/Pel for Dissection W and/or W/WO  Addendum Date: 01/01/2020   ADDENDUM REPORT: 01/01/2020 10:25 ADDENDUM: These results were called by telephone at the time of interpretation on 01/01/2020 at 10:25 am to provider Ivor Costa , who verbally acknowledged these results. Electronically  Signed   By: Zetta Bills M.D.   On: 01/01/2020 10:25   Result Date: 01/01/2020 CLINICAL DATA:  Chest pain, back pain, suspicious for aortic dissection EXAM: CT ANGIOGRAPHY CHEST, ABDOMEN AND PELVIS TECHNIQUE: Non-contrast CT of the chest was initially obtained. Multidetector CT imaging through the chest, abdomen and pelvis was performed using the standard protocol during bolus administration of intravenous contrast. Multiplanar reconstructed images and MIPs were obtained and reviewed to evaluate the vascular anatomy. CONTRAST:  147mL OMNIPAQUE IOHEXOL 350 MG/ML SOLN COMPARISON:  May 22, 2019 FINDINGS: CTA CHEST FINDINGS Cardiovascular:  Calcified atheromatous plaque in the thoracic aorta. Three-vessel coronary artery disease. Mitral annular and aortic valvular calcifications with similar appearance to the prior study. Aortic caliber 4.0 cm. No visible intramural hematoma on noncontrast imaging. Heart size is enlarged, mildly enlarged similar to the previous exam. Aorta with smooth contour with postcontrast images displaying no sign of aortic dissection. No pericardial effusion. Central pulmonary vasculature not well opacified but grossly unremarkable. Mediastinum/Nodes: Thoracic inlet structures are normal. No axillary lymphadenopathy. No mediastinal adenopathy. No hilar adenopathy. Esophagus grossly normal. Lungs/Pleura: Subpleural reticulation. This is mild and unchanged and mainly at the lung apices with mild apical pleural and parenchymal scarring. No consolidative changes. No pleural effusion. (Image 76, series 7) 5 mm lingular nodule is stable. (Image 85, series 7) small mildly spiculated nodule in the LEFT lung base in the LEFT lower lobe measuring 6 x 4 mm is also stable. This nodule is unchanged dating back to June of 2020 as is the lingular nodule. Airways are patent. Musculoskeletal: No chest wall mass, see below for full musculoskeletal details. Review of the MIP images confirms the above findings. CTA ABDOMEN AND PELVIS FINDINGS VASCULAR Aorta: Aortic caliber at the aortic hiatus 3.0 x 2.9 cm. Aorta with calcified and noncalcified atheromatous plaque measuring 2.4 x 2.3 cm in the infrarenal segment. No perivascular stranding. Extensive atheromatous plaque extends into the iliac vessels and into femoral vessels. Abundant calcification shows moderate to marked narrowing of the LEFT common iliac artery. Runoff into the proximal thigh is maintained. Celiac: Mild atheromatous plaque without significant narrowing of the celiac axis. Normal branching pattern of the celiac. SMA: SMA also patent with mild atheromatous changes. Renals: Renal  arteries with signs of atheromatous plaque without flow-limiting narrowing. Single renal arteries bilaterally. IMA: IMA is patent. Inflow: Patent without evidence of aneurysm, dissection, vasculitis or significant stenosis. Veins: Normal caliber and contour the IVC. Venous structures not well assessed due to arterial phase acquisition. Review of the MIP images confirms the above findings. NON-VASCULAR Hepatobiliary: Liver displays a cirrhotic morphology, limited assessment on immediate arterial phase. No focal, suspicious hepatic lesion. Pancreas: Pancreas with small cystic lesion measuring 13 mm in the head of the pancreas and another small cystic lesion measuring 12 mm in the neck of the pancreas. These display water density and are not associated with main duct dilation. Size is stable compared to March of 2021 Spleen: Spleen mildly enlarged unchanged from previous imaging. Adrenals/Urinary Tract: Adrenal glands are normal. Renal cortical scarring worse on the RIGHT. RIGHT-sided nephrolithiasis is similar to the prior study, 2 mm calculus in the interpolar RIGHT kidney. No hydronephrosis. Small low-density lesion arising from the anterior LEFT kidney intermediate density approximately 38 Hounsfield units unchanged with respect to size compared to previous imaging. Higher density lesion arises from the lower pole of the LEFT kidney (image 122 of series 5) 9 mm nearly isodense to adjacent medulla. Well-marginated cystic lesion in the upper pole  showed density values on portal venous phase less than 30 Hounsfield unit but with higher density on the current study approximately 45 Hounsfield units. (Image 102, series 5) 13 mm. Stomach/Bowel: Some high-density material within the GI tract at baseline. Only the proximal GI tract is visualized on the noncontrast data set but this shows high-density material in the stomach, presumably other areas of high-density material in the proximal small bowel are related to this  finding. Moderately large duodenal diverticulum arises at the ligament of Treitz measuring 5 x 4.8 cm no surrounding stranding, similar appearance to prior imaging. Query mild thickening of the hepatic flexure of the colon and ascending colon through the mid transverse colon. Pancolonic diverticulosis. Lymphatic: There is no gastrohepatic or hepatoduodenal ligament lymphadenopathy. No retroperitoneal or mesenteric lymphadenopathy. Reproductive: Post hysterectomy.  No adnexal mass. Other: No ascites.  No free air.  Midline postoperative changes. Musculoskeletal: No acute bone finding. No destructive bone process. Review of the MIP images confirms the above findings. IMPRESSION: 1. No sign of aortic dissection. With stable 4 cm ascending thoracic aortic dilation. 2. Extensive atheromatous plaque in the thoracic and abdominal aorta. Abundant calcification shows moderate to marked narrowing of the LEFT common iliac artery. 3. Cirrhosis with signs of mild portal hypertension. 4. Question mild proximal colitis. This pattern could be seen in portal colopathy, correlate with any signs of colitis. Colonic assessment limited by under distension. 5. Stable cystic lesions in the pancreas, unchanged since prior imaging, continued attention on follow-up is suggested. If no follow-up is planned 2 year follow-up could be considered based on stability since 2019. 6. Layering high density material in the distal duodenum matches the density of high-density material in the stomach on precontrast images. Duodenum not imaged on pre contrast assessment. Given that it matches the density is favored to represent ingested material. Correlate with any clinical evidence of GI bleeding. 7. Small renal lesions of intermediate density, most likely hemorrhagic cyst though with area in the lower pole that raises the question of small solid lesion. Consider six-month follow-up with dedicated renal protocol or MRI. 8. RIGHT-sided nephrolithiasis and  cortical scarring without hydronephrosis. 9. Aortic atherosclerosis. 10. A call is out to the referring provider to further discuss findings in the above case. Aortic Atherosclerosis (ICD10-I70.0). Electronically Signed: By: Zetta Bills M.D. On: 01/01/2020 10:11     Echo Pending  TELEMETRY: sinus rhythm, 57-60s  ASSESSMENT AND PLAN:  Principal Problem:   Chest pain Active Problems:   Essential (primary) hypertension   Gastroduodenal ulcer   Hypothyroidism   GERD (gastroesophageal reflux disease)   Thoracoabdominal aortic aneurysm (TAAA) (HCC)   Anxiety and depression   Hypokalemia   Atrial fibrillation, new onset (HCC)   Elevated troponin   CAD (coronary artery disease)    1. New onset atrial fibrillation with RVR, with rates in the 130s-150s per EMS, with spontaneous conversion to normal sinus rhythm, in the setting of hypokalemia. She has remained in sinus rhythm with rates in the high 50s-60s. Denies recurrent chest pain, shortness of breath, or palpitations. Chads vasc score of 4.  Currently in sinus rhythm. 2. Elevated troponin, likely secondary to demand supply ischemia in the setting of rapid ventricular rate. Initial troponin mildly elevated at 60, 72, 68, 89, 97.  3. Hypokalemia, initial K 2.7, replenished and now 3.5 this morning 4. Essential hypertension, on amlodipine 5. Hyperlipidemia, on atorvastatin 6.  Thoracic aneurysm, chest CT shows stable 4 cm dilatation   Plan:  1. Continue to trend troponin  2. Review 2D echocardiogram 3. Continue to defer chronic anticoagulation at this time as patient has remained in sinus rhythm since her commute to ER 4. Will plan to place 1-week Holter monitor prior to discharge, and plan to have patient follow-up with Dr. Josefa Half (patient's request) in 2 weeks 5. Recommend KCl supplement at discharge  Clabe Seal, PA-C 01/02/2020 9:21 AM

## 2020-01-02 NOTE — Progress Notes (Signed)
PT Cancellation Note  Patient Details Name: Kaitlyn Oliver MRN: [id number] DOB: 1933-09-17   Cancelled Treatment:    Reason Eval/Treat Not Completed: Other (comment).  PT consult received.  Chart reviewed.  Pt resting in bed upon PT arrival; pt's husband present.  Pt reporting that she did not need any physical therapy and had no difficulty walking; pt also reporting getting ready to discharge home soon.  Unable to perform PT evaluation d/t pt declining assessment.  If pt does not discharge home today as planned, will check back on pt tomorrow.  Leitha Bleak, PT 01/02/20, 1:55 PM

## 2020-01-02 NOTE — Progress Notes (Signed)
Lab called with critical troponin at 105. MD made aware. No new orders, will continue to monitor.

## 2020-01-02 NOTE — Care Management CC44 (Signed)
Condition Code 44 Documentation Completed  Patient Details  Name: Kaitlyn Oliver MRN: [id number] Date of Birth: [birth year]   Condition Code 44 given:  Yes Patient signature on Condition Code 44 notice:  Yes Documentation of 2 MD's agreement:  Yes Code 44 added to claim:  Yes    Gerrianne Scale Nikya Busler, LCSW 01/02/2020, 1:43 PM

## 2020-01-02 NOTE — Progress Notes (Signed)
Discharge instructions explained/verbalizied understanding. IV and tele removed. Will transport off unit via wheelchair.

## 2020-01-06 ENCOUNTER — Other Ambulatory Visit: Payer: Self-pay

## 2020-01-06 ENCOUNTER — Encounter: Payer: Self-pay | Admitting: Family Medicine

## 2020-01-06 ENCOUNTER — Ambulatory Visit (INDEPENDENT_AMBULATORY_CARE_PROVIDER_SITE_OTHER): Payer: Medicare HMO | Admitting: Family Medicine

## 2020-01-06 VITALS — BP 120/70 | HR 80 | Ht 65.0 in | Wt 198.0 lb

## 2020-01-06 DIAGNOSIS — I10 Essential (primary) hypertension: Secondary | ICD-10-CM | POA: Diagnosis not present

## 2020-01-06 DIAGNOSIS — E782 Mixed hyperlipidemia: Secondary | ICD-10-CM | POA: Diagnosis not present

## 2020-01-06 DIAGNOSIS — Z09 Encounter for follow-up examination after completed treatment for conditions other than malignant neoplasm: Secondary | ICD-10-CM

## 2020-01-06 MED ORDER — EZETIMIBE 10 MG PO TABS: 10.0000 mg | ORAL_TABLET | Freq: Every day | ORAL | 1 refills | Status: DC

## 2020-01-06 NOTE — Progress Notes (Signed)
Date:  01/06/2020   Name:  Kaitlyn Oliver   DOB:  29-Nov-1933   MRN:  [id number]   Chief Complaint: Follow-up (chest pain) and Hyperlipidemia (is willing to try Zetia, NOT A STATIN)  Patient is a 86 year old female who presents for a hospital discharge hospital exam. The patient reports the following problems: presently stable. Health maintenance has been reviewed up to date.  Hyperlipidemia This is a chronic problem. The current episode started more than 1 year ago. The problem is controlled. Recent lipid tests were reviewed and are normal. She has no history of chronic renal disease, diabetes, hypothyroidism, liver disease, obesity or nephrotic syndrome. There are no known factors aggravating her hyperlipidemia. Pertinent negatives include no chest pain, focal sensory loss, focal weakness, leg pain, myalgias or shortness of breath. The current treatment provides moderate improvement of lipids. There are no compliance problems.  Risk factors for coronary artery disease include dyslipidemia and hypertension.    Lab Results  Component Value Date   CREATININE 0.84 01/02/2020   BUN 11 01/02/2020   NA 141 01/02/2020   K 3.5 01/02/2020   CL 107 01/02/2020   CO2 24 01/02/2020   Lab Results  Component Value Date   CHOL 227 (H) 02/15/2017   HDL 33 (L) 02/15/2017   LDLCALC Comment 02/15/2017   TRIG 534 (H) 02/15/2017   CHOLHDL 6.9 (H) 02/15/2017   Lab Results  Component Value Date   TSH 4.658 (H) 01/01/2020   Lab Results  Component Value Date   HGBA1C 5.6 01/01/2020   Lab Results  Component Value Date   WBC 5.1 01/02/2020   HGB 11.9 (L) 01/02/2020   HCT 35.4 (L) 01/02/2020   MCV 90.8 01/02/2020   PLT 150 01/02/2020   Lab Results  Component Value Date   ALT 43 11/22/2019   AST 57 (H) 11/22/2019   ALKPHOS 64 11/22/2019   BILITOT 0.8 11/22/2019     Review of Systems  Constitutional: Negative.  Negative for chills, fatigue, fever and unexpected weight change.  HENT:  Negative for congestion, ear discharge, ear pain, postnasal drip, rhinorrhea, sinus pressure, sneezing and sore throat.   Eyes: Negative for photophobia, pain, discharge, redness and itching.  Respiratory: Negative for cough, shortness of breath, wheezing and stridor.   Cardiovascular: Negative for chest pain, palpitations and leg swelling.  Gastrointestinal: Negative for abdominal pain, blood in stool, constipation, diarrhea, nausea and vomiting.  Endocrine: Negative for cold intolerance, heat intolerance, polydipsia, polyphagia and polyuria.  Genitourinary: Negative for dysuria, flank pain, frequency, hematuria, menstrual problem, pelvic pain, urgency, vaginal bleeding and vaginal discharge.  Musculoskeletal: Negative for arthralgias, back pain and myalgias.  Skin: Negative for pallor and rash.  Allergic/Immunologic: Negative for environmental allergies and food allergies.  Neurological: Negative for dizziness, focal weakness, weakness, light-headedness, numbness and headaches.  Hematological: Negative for adenopathy. Does not bruise/bleed easily.  Psychiatric/Behavioral: Negative for dysphoric mood. The patient is not nervous/anxious.     Patient Active Problem List   Diagnosis Date Noted  . Chest pain 01/01/2020  . Atrial fibrillation, new onset ([place]) 01/01/2020  . Elevated troponin 01/01/2020  . CAD (coronary artery disease) 01/01/2020  . Hypokalemia 08/20/2018  . 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 Goals of care, counseling/discussion 01/26/2018  . Small bowel tumor   . Pancreatic cyst 01/15/2018  . Anxiety and depression 01/15/2018  . Pancreas cyst 01/15/2018  . Bowel obstruction ([place]) 11/29/2017  . Small bowel obstruction ([place])   .  Thoracoabdominal aortic aneurysm (TAAA) ([place]) 11/19/2017  . Status post total knee replacement using cement, right 05/30/2017  . GERD (gastroesophageal reflux disease) 12/22/2015  . Adenomatous polyp 09/11/2014  . Bilateral  cataracts 09/11/2014  . Gastric catarrh 09/11/2014  . Gastroduodenal ulcer 09/11/2014  . Hypothyroidism 09/11/2014  . Cardiac murmur 10/08/2013  . Essential (primary) hypertension 10/08/2013  . Combined fat and carbohydrate induced hyperlipemia 10/08/2013    Allergies  Allergen Reactions  . Adhesive [Tape] Dermatitis    Can not use plastic tape. Paper tape ok   . Asa [Aspirin]     Itchy   . Penicillin G Itching    Has patient had a PCN reaction causing immediate rash, facial/tongue/throat swelling, SOB or lightheadedness with hypotension: No Has patient had a PCN reaction causing severe rash involving mucus membranes or skin necrosis: No Has patient had a PCN reaction that required hospitalization: No Has patient had a PCN reaction occurring within the last 10 years: No If all of the above answers are "NO", then may proceed with Cephalosporin use.   Marland Kitchen Hydrocodone-Acetaminophen Itching and Rash    Past Surgical History:  Procedure Laterality Date  . BOWEL RESECTION  01/17/2018   Procedure: SMALL BOWEL RESECTION;  Surgeon: Jules Husbands, MD;  Location: [facility];  Service: General;;  . BREAST SURGERY Bilateral 1991   mastectomy  . CATARACT EXTRACTION Bilateral   . CHOLECYSTECTOMY    . COLONOSCOPY  2013   Dr Gustavo Lah  . HALLUX VALGUS AUSTIN Right 06/03/2015   Procedure: HALLUX VALGUS AUSTIN;  Surgeon: Samara Deist, DPM;  Location: [place];  Service: Podiatry;  Laterality: Right;  WITH POPLITEAL  . KNEE ARTHROSCOPY WITH MEDIAL MENISECTOMY Right 08/30/2016   Procedure: KNEE ARTHROSCOPY WITH PARTIAL MEDIAL MENISECTOMY and Partial Lateral Menisectomy;  Surgeon: Hessie Knows, MD;  Location: [facility];  Service: Orthopedics;  Laterality: Right;  . KNEE ARTHROSCOPY WITH SUBCHONDROPLASTY Right 08/30/2016   Procedure: KNEE ARTHROSCOPY WITH SUBCHONDROPLASTY;  Surgeon: Hessie Knows, MD;  Location: [facility];  Service: Orthopedics;  Laterality: Right;  . LAPAROTOMY N/A 01/17/2018     Procedure: EXPLORATORY LAPAROTOMY;  Surgeon: Jules Husbands, MD;  Location: [facility];  Service: General;  Laterality: N/A;  . MASTECTOMY Bilateral   . ROTATOR CUFF REPAIR Bilateral   . TONSILLECTOMY    . TOTAL KNEE ARTHROPLASTY Left   . TOTAL KNEE ARTHROPLASTY Right 05/30/2017   Procedure: TOTAL KNEE ARTHROPLASTY;  Surgeon: Hessie Knows, MD;  Location: [facility];  Service: Orthopedics;  Laterality: Right;  Marland Kitchen VAGINAL HYSTERECTOMY      Social History   Tobacco Use  . Smoking status: Former Smoker    Packs/day: 0.25    Years: 10.00    Pack years: 2.50    Types: Cigarettes    Quit date: 1991    Years since quitting: 30.8  . Smokeless tobacco: Never Used  . Tobacco comment: smoking cessation materials not required  Vaping Use  . Vaping Use: Never used  Substance Use Topics  . Alcohol use: No    Alcohol/week: 0.0 standard drinks  . Drug use: No     Medication list has been reviewed and updated.  Current Meds  Medication Sig  . amLODipine (NORVASC) 5 MG tablet Take 1 tablet (5 mg total) by mouth daily. (Patient taking differently: Take 5 mg by mouth daily. )  . EUTHYROX 50 MCG tablet Take 50 mcg by mouth daily.  Marland Kitchen lisinopril (ZESTRIL) 5 MG tablet Take 1 tablet (5 mg total) by  mouth daily.  Marland Kitchen omeprazole (PRILOSEC) 20 MG capsule Take 1 capsule (20 mg total) by mouth daily.    PHQ 2/9 Scores 01/06/2020 12/02/2019 09/05/2019 09/03/2019  PHQ - 2 Score 0 0 1 0  PHQ- 9 Score 1 - 1 5    GAD 7 : Generalized Anxiety Score 01/06/2020 09/05/2019 09/03/2019 07/04/2019  Nervous, Anxious, on Edge 0 0 0 0  Control/stop worrying 0 0 0 0  Worry too much - different things 0 0 0 0  Trouble relaxing 0 0 0 0  Restless 0 0 0 0  Easily annoyed or irritable 0 2 0 0  Afraid - awful might happen 0 0 0 0  Total GAD 7 Score 0 2 0 0  Anxiety Difficulty - Not difficult at all - Not difficult at all    BP Readings from Last 3 Encounters:  01/06/20 120/70  01/02/20 (!) 132/99  11/22/19 (!) 146/80     Physical Exam Vitals and nursing note reviewed.  Constitutional:      Appearance: She is well-developed.  HENT:     Head: Normocephalic.     Right Ear: Tympanic membrane, ear canal and external ear normal.     Left Ear: Tympanic membrane, ear canal and external ear normal.  Eyes:     General: Lids are everted, no foreign bodies appreciated. No scleral icterus.       Left eye: No foreign body or hordeolum.     Conjunctiva/sclera: Conjunctivae normal.     Right eye: Right conjunctiva is not injected.     Left eye: Left conjunctiva is not injected.     Pupils: Pupils are equal, round, and reactive to light.  Neck:     Thyroid: No thyromegaly.     Vascular: No JVD.     Trachea: No tracheal deviation.  Cardiovascular:     Rate and Rhythm: Normal rate and regular rhythm.     Heart sounds: Normal heart sounds. No murmur heard.  No friction rub. No gallop.   Pulmonary:     Effort: Pulmonary effort is normal. No respiratory distress.     Breath sounds: Normal breath sounds. No wheezing or rales.  Abdominal:     General: Bowel sounds are normal.     Palpations: Abdomen is soft. There is no mass.     Tenderness: There is no abdominal tenderness. There is no guarding or rebound.  Musculoskeletal:        General: No tenderness. Normal range of motion.     Cervical back: Normal range of motion and neck supple.  Lymphadenopathy:     Cervical: No cervical adenopathy.  Skin:    General: Skin is warm.     Findings: No rash.  Neurological:     Mental Status: She is alert and oriented to person, place, and time.     Cranial Nerves: No cranial nerve deficit.     Deep Tendon Reflexes: Reflexes normal.  Psychiatric:        Mood and Affect: Mood is not anxious or depressed.     Wt Readings from Last 3 Encounters:  01/06/20 198 lb (89.8 kg)  01/01/20 186 lb 3.2 oz (84.5 kg)  11/22/19 194 lb 9.6 oz (88.3 kg)    BP 120/70   Pulse 80   Ht 5\' 5"  (1.651 m)   Wt 198 lb (89.8 kg)   BMI  32.95 kg/m   Assessment and Plan: 1. Hospital discharge follow-up Patient was admitted on 01/01/2020 and discharged on  01/02/2020 from [facility] for chest pain and elevated troponin.  During the course of the hospitalization patient had cardiac echo that was unremarkable.  Patient is doing well with no further episodes of chest discomfort nor episodes of palpitations.  2. Essential (primary) hypertension Chronic.  Controlled.  Stable.  Patient had amlodipine 5 mg once a day added to her regimen of lisinopril 5 mg and her blood pressure is excellent with a reading of 120/70.  Patient will return in 6 weeks at which time we would repeat lab work to look at electrolytes and GFR.  3. Combined fat and carbohydrate induced hyperlipemia Chronic.  Uncontrolled.  Stable.  Patient was given a prescription for atorvastatin 40 mg but she has been unable to tolerate this in the past.  We will instead initiate Zetia 10 mg once a day.  Patient will return in 6 weeks fasting for lipid panel. - ezetimibe (ZETIA) 10 MG tablet; Take 1 tablet (10 mg total) by mouth daily.  Dispense: 30 tablet; Refill: 1

## 2020-01-16 ENCOUNTER — Telehealth: Payer: Self-pay | Admitting: Family Medicine

## 2020-01-16 NOTE — Telephone Encounter (Signed)
Home Health Verbal Orders - Caller/Agency: Kizzie Furnish, [place] Nurse Case Manager  Callback Number: [phone] Requesting OT/PT/Skilled Nursing/Social Work/Speech Therapy:  Frequency:   Kaitlyn Oliver called to report that the patient is not taking ezetimibe (ZETIA) 10 MG tablet   Her potassium prescription has expired also

## 2020-01-17 NOTE — Telephone Encounter (Signed)
Spoke to Kaitlyn Oliver concerning pt not taking meds. She has put in a call to [name] concerning the pt as well.

## 2020-01-22 ENCOUNTER — Telehealth: Payer: Self-pay | Admitting: *Deleted

## 2020-01-22 NOTE — Chronic Care Management (AMB) (Signed)
  Chronic Care Management   Outreach Note  01/22/2020 Name: Kaitlyn Oliver MRN: [id number] DOB: 1933-05-25  Kaitlyn Oliver is a 86 y.o. year old female who is a primary care patient of Juline Patch, MD. I reached out to Jamison Neighbor by phone today in response to a referral sent by Ms. [employer].     An unsuccessful telephone outreach was attempted today. The patient was referred to the case management team for assistance with care management and care coordination.   Follow Up Plan: A HIPAA compliant phone message was left for the patient providing contact information and requesting a return call. The care management team will reach out to the patient again over the next 7 days. If patient returns call to provider office, please advise to call [place] at [phone].   [place] Management

## 2020-01-27 DIAGNOSIS — R002 Palpitations: Secondary | ICD-10-CM | POA: Insufficient documentation

## 2020-01-27 DIAGNOSIS — E876 Hypokalemia: Secondary | ICD-10-CM | POA: Diagnosis not present

## 2020-01-27 DIAGNOSIS — I10 Essential (primary) hypertension: Secondary | ICD-10-CM | POA: Diagnosis not present

## 2020-01-27 DIAGNOSIS — E782 Mixed hyperlipidemia: Secondary | ICD-10-CM | POA: Diagnosis not present

## 2020-01-27 DIAGNOSIS — Z79899 Other long term (current) drug therapy: Secondary | ICD-10-CM | POA: Diagnosis not present

## 2020-01-27 DIAGNOSIS — I480 Paroxysmal atrial fibrillation: Secondary | ICD-10-CM | POA: Diagnosis not present

## 2020-01-29 NOTE — Chronic Care Management (AMB) (Signed)
  Chronic Care Management   Outreach Note  01/29/2020 Name: Kaitlyn Oliver MRN: [id number] DOB: Aug 12, 1933  Kaitlyn Oliver is a 86 y.o. year old female who is a primary care patient of Juline Patch, MD. I reached out to Kaitlyn Oliver by phone today in response to a referral sent by Ms. [employer]a [employer].     A second unsuccessful telephone outreach was attempted today. The patient was referred to the case management team for assistance with care management and care coordination.   Follow Up Plan: A HIPAA compliant phone message was left for the patient providing contact information and requesting a return call. The care management team will reach out to the patient again over the next 7-14 days. If patient returns call to provider office, please advise to call [place] at [phone].  [place] Management  Direct Dial: [phone]

## 2020-02-10 NOTE — Chronic Care Management (AMB) (Signed)
  Chronic Care Management   Note  02/10/2020 Name: Kaitlyn Oliver MRN: [id number] DOB: 07/19/1933  Kaitlyn Oliver is a 86 y.o. year old female who is a primary care patient of Juline Patch, MD. I reached out to Jamison Neighbor by phone today in response to a referral sent by Ms. Tarika [employer].     Kaitlyn Oliver was given information about Chronic Care Management services today including:  1. CCM service includes personalized support from designated clinical staff supervised by her physician, including individualized plan of care and coordination with other care providers 2. 24/7 contact phone numbers for assistance for urgent and routine care needs. 3. Service will only be billed when office clinical staff spend 20 minutes or more in a month to coordinate care. 4. Only one practitioner may furnish and bill the service in a calendar month. 5. The patient may stop CCM services at any time (effective at the end of the month) by phone call to the office staff. 6. The patient will be responsible for cost sharing (co-pay) of up to 20% of the service fee (after annual deductible is met).  Patient agreed to services and verbal consent obtained.   Follow up plan: Face to Face appointment with care management team member scheduled for: 02/27/2020  If patient returns call to provider office, please advise to call [place] at [phone].  [place] Management

## 2020-02-12 ENCOUNTER — Ambulatory Visit: Payer: Medicare HMO | Admitting: Dermatology

## 2020-02-12 ENCOUNTER — Other Ambulatory Visit: Payer: Self-pay

## 2020-02-12 DIAGNOSIS — D485 Neoplasm of uncertain behavior of skin: Secondary | ICD-10-CM

## 2020-02-12 DIAGNOSIS — D492 Neoplasm of unspecified behavior of bone, soft tissue, and skin: Secondary | ICD-10-CM

## 2020-02-12 DIAGNOSIS — L820 Inflamed seborrheic keratosis: Secondary | ICD-10-CM | POA: Diagnosis not present

## 2020-02-12 DIAGNOSIS — L570 Actinic keratosis: Secondary | ICD-10-CM

## 2020-02-12 DIAGNOSIS — L578 Other skin changes due to chronic exposure to nonionizing radiation: Secondary | ICD-10-CM | POA: Diagnosis not present

## 2020-02-12 DIAGNOSIS — D1801 Hemangioma of skin and subcutaneous tissue: Secondary | ICD-10-CM | POA: Diagnosis not present

## 2020-02-12 NOTE — Progress Notes (Addendum)
New Patient Visit  Subjective  Kaitlyn Oliver is a 86 y.o. female who presents for the following: Skin Problem (New pt presents for spot check on her face and hands, no history of skin cancer ).  Pt c/o irritated itchy area on the back will not go away. Pt c/o painful area in her R palm of hand for about 30 year now she think she may have a piece of glass in this area.  The following portions of the chart were reviewed this encounter and updated as appropriate:  Tobacco  Allergies  Meds  Problems  Med Hx  Surg Hx  Fam Hx     Review of Systems:  No other skin or systemic complaints except as noted in HPI or Assessment and Plan.  Objective  Well appearing patient in no apparent distress; mood and affect are within normal limits.  A focused examination was performed including face,hands . Relevant physical exam findings are noted in the Assessment and Plan.  Objective  R nasal bridge, R nasal tip, L upper lip (3): Erythematous thin papules/macules with gritty scale.   Objective  Mid Back: Erythematous keratotic or waxy stuck-on papule or plaque.   Objective  Right palm of hand: Glass or splinter    Assessment & Plan  AK (actinic keratosis) (3) R nasal bridge, R nasal tip, L upper lip  Destruction of lesion - R nasal bridge, R nasal tip, L upper lip Complexity: simple   Destruction method: cryotherapy   Informed consent: discussed and consent obtained   Timeout:  patient name, date of birth, surgical site, and procedure verified Lesion destroyed using liquid nitrogen: Yes   Region frozen until ice ball extended beyond lesion: Yes   Outcome: patient tolerated procedure well with no complications   Post-procedure details: wound care instructions given    Inflamed seborrheic keratosis Mid Back  Destruction of lesion - Mid Back Complexity: simple   Destruction method: cryotherapy   Informed consent: discussed and consent obtained   Timeout:  patient name, date of  birth, surgical site, and procedure verified Lesion destroyed using liquid nitrogen: Yes   Region frozen until ice ball extended beyond lesion: Yes   Outcome: patient tolerated procedure well with no complications   Post-procedure details: wound care instructions given    Neoplasm of skin Right palm of hand  Skin excision  Informed consent: discussed and consent obtained   Timeout: patient name, date of birth, surgical site, and procedure verified   Procedure prep:  Patient was prepped and draped in usual sterile fashion Prep type:  Isopropyl alcohol Anesthesia: the lesion was anesthetized in a standard fashion   Anesthetic:  1% lidocaine w/ epinephrine 1-100,000 buffered w/ 8.4% NaHCO3 Instrument used comment:  2.5 mm punch Hemostasis achieved with: pressure   Outcome: patient tolerated procedure well with no complications   Dressing: Mupirocin.    Skin repair Complexity:  Simple Fine/surface layer approximation (top stitches):  Suture type: nylon   Stitches: simple interrupted   Outcome: patient tolerated procedure well with no complications   Post-procedure details: wound care instructions given   Post-procedure details comment:  Ointment and pressure bandage applied  Specimen 1 - Surgical pathology Differential Diagnosis: R/O Foreign body  Check Margins: No Glass or splinter  Actinic Damage - chronic, secondary to cumulative UV radiation exposure/sun exposure over time - diffuse scaly erythematous macules with underlying dyspigmentation - Recommend daily broad spectrum sunscreen SPF 30+ to sun-exposed areas, reapply every 2 hours as needed.  -  Call for new or changing lesions.  Return in about 1 week (around 02/19/2020) for suture removal .  I, Marye Round, CMA, am acting as scribe for Sarina Ser, MD .  Documentation: I have reviewed the above documentation for accuracy and completeness, and I agree with the above.  Sarina Ser, MD

## 2020-02-12 NOTE — Patient Instructions (Signed)

## 2020-02-17 ENCOUNTER — Encounter: Payer: Self-pay | Admitting: Dermatology

## 2020-02-17 ENCOUNTER — Telehealth: Payer: Self-pay

## 2020-02-17 NOTE — Telephone Encounter (Signed)
Patient informed of pathology results 

## 2020-02-17 NOTE — Telephone Encounter (Signed)
-----   Message from Ralene Bathe, MD sent at 02/17/2020 11:54 AM EST ----- Diagnosis Skin , right palm of hand GLOMANGIOMA  Benign glomangioma May need further treatment if persistent pain. Recheck next visit

## 2020-02-19 ENCOUNTER — Other Ambulatory Visit: Payer: Self-pay

## 2020-02-19 ENCOUNTER — Ambulatory Visit (INDEPENDENT_AMBULATORY_CARE_PROVIDER_SITE_OTHER): Payer: Medicare HMO | Admitting: Dermatology

## 2020-02-19 DIAGNOSIS — Z4802 Encounter for removal of sutures: Secondary | ICD-10-CM

## 2020-02-19 DIAGNOSIS — D1800 Hemangioma unspecified site: Secondary | ICD-10-CM

## 2020-02-19 NOTE — Progress Notes (Signed)
   Follow-Up Visit   Subjective  Kaitlyn Oliver is a 86 y.o. female who presents for the following: Follow-up (1 week f/u Suture removal biopsy proven Glomangioma R palm of hand ).  The following portions of the chart were reviewed this encounter and updated as appropriate:   Tobacco  Allergies  Meds  Problems  Med Hx  Surg Hx  Fam Hx     Review of Systems:  No other skin or systemic complaints except as noted in HPI or Assessment and Plan.  Objective  Well appearing patient in no apparent distress; mood and affect are within normal limits.  A focused examination was performed including R hand. Relevant physical exam findings are noted in the Assessment and Plan.  Objective  Right Hand - Anterior: Well healed scar    Assessment & Plan  Glomangioma Right Hand - Anterior  Biopsy proven Glomangioma discussed biopsy results   Encounter for Removal of Sutures - Incision site at the R palm of hand is clean, dry and intact - Wound cleansed, sutures removed, wound cleansed and steri strips applied.  - Discussed pathology results showing Glomangioma  - Patient advised to keep steri-strips dry until they fall off. - Scars remodel for a full year. - Once steri-strips fall off, patient can apply over-the-counter silicone scar cream each night to help with scar remodeling if desired. - Patient advised to call with any concerns or if they notice any new or changing lesions.   Return if symptoms worsen or fail to improve, for as scheduled in 3 months .  IMarye Round, CMA, am acting as scribe for Sarina Ser, MD .  Documentation: I have reviewed the above documentation for accuracy and completeness, and I agree with the above.  Sarina Ser, MD

## 2020-02-21 ENCOUNTER — Telehealth: Payer: Self-pay | Admitting: Pharmacist

## 2020-02-21 NOTE — Progress Notes (Addendum)
Chronic Care Management Pharmacy Assistant   Name: Marliss Buttacavoli  MRN: [id number] DOB: Aug 23, 1933  Reason for Encounter: Initial Questions/Appt. Reminder with PharmD on 02/27/20.  Patient Questions:  1.  Have you seen any other providers since your last visit? Yes, 02/19/20 Office Visit Ralene Bathe, MD-Derm  2.  Any changes in your medicines or health? No   PCP : Juline Patch, MD  Allergies:   Allergies  Allergen Reactions   Adhesive [Tape] Dermatitis    Can not use plastic tape. Paper tape ok    Asa [Aspirin]     Itchy    Penicillin G Itching    Has patient had a PCN reaction causing immediate rash, facial/tongue/throat swelling, SOB or lightheadedness with hypotension: No Has patient had a PCN reaction causing severe rash involving mucus membranes or skin necrosis: No Has patient had a PCN reaction that required hospitalization: No Has patient had a PCN reaction occurring within the last 10 years: No If all of the above answers are "NO", then may proceed with Cephalosporin use.    Hydrocodone-Acetaminophen Itching and Rash    Medications: Outpatient Encounter Medications as of 02/21/2020  Medication Sig   amLODipine (NORVASC) 5 MG tablet Take 1 tablet (5 mg total) by mouth daily. (Patient taking differently: Take 5 mg by mouth daily. )   EUTHYROX 50 MCG tablet Take 50 mcg by mouth daily.   ezetimibe (ZETIA) 10 MG tablet Take 1 tablet (10 mg total) by mouth daily.   lisinopril (ZESTRIL) 5 MG tablet Take 1 tablet (5 mg total) by mouth daily.   omeprazole (PRILOSEC) 20 MG capsule Take 1 capsule (20 mg total) by mouth daily.   No facility-administered encounter medications on file as of 02/21/2020.    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   Goals of care, counseling/discussion 01/26/2018   Small bowel tumor    Pancreatic cyst 01/15/2018   Anxiety and depression 01/15/2018   Pancreas cyst 01/15/2018   Bowel obstruction ([place]) 11/29/2017   Small bowel obstruction ([facility])    Thoracoabdominal aortic aneurysm (TAAA) ([place]) 11/19/2017   Status post total knee replacement using cement, right 05/30/2017   GERD (gastroesophageal reflux disease) 12/22/2015   Adenomatous polyp 09/11/2014   Bilateral cataracts 09/11/2014   Gastric catarrh 09/11/2014   Gastroduodenal ulcer 09/11/2014   Hypothyroidism 09/11/2014   Cardiac murmur 10/08/2013   Essential (primary) hypertension 10/08/2013   Combined fat and carbohydrate induced hyperlipemia 10/08/2013    Goals Addressed   None    Have you seen any other providers since your last visit? Yes, 02/19/20 Office Visit Ralene Bathe, MD-Derm.  Any changes in your medications or health? No  Any side effects from any medications? No.   Do you have an symptoms or problems not managed by your medications? No.  Any concerns about your health right now? No.  Has your provider asked that you check blood pressure, blood sugar, or follow special diet at home? pickled beets and anything with high potassium to not go into Afib.  Do you get any type of exercise on a regular basis? Cycling with exercise bike (2 to 3 times a day) and walk dogs daily.  Can you think of a goal  you would like to reach for your health? Patient states hope to stay healthy.  Do you have any problems getting your medications? No.  Is there anything that you would like to discuss during the appointment? No.  Please bring medications and supplements to appointment: Patient has a sensitive allergy with supplements.  Raynelle Highland, [place] Assistant [phone]  Follow-Up:  Pharmacist Review-The patient confirmed Initial CCM visit on  02/27/20 at 9:00 AM with PharmD Birdena Crandall. CPA did ask the patient to please bring in her current medications to appointment. The patient voiced "For what; you have everything in the system". CPA explained to the patient the reason why we ask bring in your current medications is because we need to make sure we match and update what is on file to better assist the patient. The patient verbalized understanding.  I have reviewed the care management and care coordination activities outlined in this encounter and I am certifying that I agree with the content of this note.  [name]. Kenton Kingfisher PharmD, [facility] [phone]

## 2020-02-21 NOTE — Progress Notes (Signed)
Opened in Error.  Kaitlyn Oliver, [place] Assistant [phone]

## 2020-02-25 ENCOUNTER — Encounter: Payer: Self-pay | Admitting: Dermatology

## 2020-02-25 ENCOUNTER — Ambulatory Visit: Payer: Medicare HMO | Admitting: Family Medicine

## 2020-02-27 ENCOUNTER — Ambulatory Visit: Payer: Medicare HMO

## 2020-03-02 DIAGNOSIS — E039 Hypothyroidism, unspecified: Secondary | ICD-10-CM | POA: Diagnosis not present

## 2020-03-03 ENCOUNTER — Ambulatory Visit: Payer: Medicare HMO | Admitting: Pharmacist

## 2020-03-03 DIAGNOSIS — E034 Atrophy of thyroid (acquired): Secondary | ICD-10-CM

## 2020-03-03 DIAGNOSIS — I10 Essential (primary) hypertension: Secondary | ICD-10-CM

## 2020-03-03 NOTE — Chronic Care Management (AMB) (Signed)
Chronic Care Management Pharmacy  Name: Kaitlyn Oliver  MRN: [id number] DOB: [birth year]   Chief Complaint/ HPI  Kaitlyn Oliver,  86 y.o. , female presents for her Initial CCM visit with the clinical pharmacist via telephone.  PCP : Duanne Limerick, MD Patient Care Team: Duanne Limerick, MD as PCP - General (Family Medicine) Lajean Manes, [facility] (Pharmacist)  Patient's chronic conditions include: Hypertension, Atrial Fibrillation, Heart Failure, GERD, Depression and Anxiety remote history of breast cancer with bilateral mastectomy, neuroendocrin carcinoma of small intestine, resection of thoracoabdominal AA without rupture.  Office Visits: 01/06/20 - Dr. Yetta Barre - hospital follow up- Zetia  Consult Visit: 03/02/20- Dr. Gershon Crane, Endocrinology- thyroid labs 01/27/20- Dr. Darrold Junker, Cardiology - Holter report predominant sinus rhythm, meas hr 61 bpm, brady at 47, pac and pvc's occasionally  Afib 2/2 low K 01/27/20- A Drane, PA- cards- lisinopril changed to losartan 2/2 cough, defer anticoag, monitor potassium, low sodium diet-- no K supplementation 01/01/20- [facility]- via EMS, CP, afib, K+ =2.7, lisinopril 5 mg    Subjective: " I am fine"  Objective: Allergies  Allergen Reactions  . Adhesive [Tape] Dermatitis    Can not use plastic tape. Paper tape ok   . Asa [Aspirin]     Itchy   . Penicillin G Itching    Has patient had a PCN reaction causing immediate rash, facial/tongue/throat swelling, SOB or lightheadedness with hypotension: No Has patient had a PCN reaction causing severe rash involving mucus membranes or skin necrosis: No Has patient had a PCN reaction that required hospitalization: No Has patient had a PCN reaction occurring within the last 10 years: No If all of the above answers are "NO", then may proceed with Cephalosporin use.   Marland Kitchen Hydrocodone-Acetaminophen Itching and Rash    Medications: Outpatient Encounter Medications as of 03/03/2020  Medication  Sig  . amLODipine (NORVASC) 5 MG tablet Take 1 tablet (5 mg total) by mouth daily. (Patient taking differently: Take 5 mg by mouth daily.)  . EUTHYROX 50 MCG tablet Take 50 mcg by mouth daily.  Marland Kitchen losartan (COZAAR) 25 MG tablet Take 25 mg by mouth daily.  Marland Kitchen omeprazole (PRILOSEC) 20 MG capsule Take 1 capsule (20 mg total) by mouth daily.  . [DISCONTINUED] lisinopril (ZESTRIL) 5 MG tablet Take 1 tablet (5 mg total) by mouth daily.  . [DISCONTINUED] ezetimibe (ZETIA) 10 MG tablet Take 1 tablet (10 mg total) by mouth daily. (Patient not taking: Reported on 03/03/2020)   No facility-administered encounter medications on file as of 03/03/2020.    Wt Readings from Last 3 Encounters:  01/06/20 198 lb (89.8 kg)  01/01/20 186 lb 3.2 oz (84.5 kg)  11/22/19 194 lb 9.6 oz (88.3 kg)    Lab Results  Component Value Date   CREATININE 0.84 01/02/2020   BUN 11 01/02/2020   GFRNONAA >60 01/02/2020   GFRAA >60 11/22/2019   NA 141 01/02/2020   K 3.5 01/02/2020   CALCIUM 9.2 01/02/2020   CO2 24 01/02/2020     Current Diagnosis/Assessment:    Goals Addressed            This Visit's Progress   . Pharmacy Care Plan       CARE PLAN ENTRY (see longitudinal plan of care for additional care plan information)  Current Barriers:  . Chronic Disease Management support, education, and care coordination needs related to Hypertension, Hyperlipidemia, GERD, and Hypothyroidism   Hypertension BP Readings from Last 3 Encounters:  01/06/20 120/70  01/02/20 Marland Kitchen)  132/99  11/22/19 (!) 146/80   . Pharmacist Clinical Goal(s): o Over the next 60 days, patient will work with PharmD and providers to maintain BP goal <130/80 . Current regimen:  o Losartan 25 mg qd o Amlodipine 5 mg qd . Interventions: o Reviewed BP technique and encouraged regular home monitoring o Provided diet and exercise counseling. o Reviewed fill history o Reviewed notes from cardiology . Patient self care activities - Over the next  60 days, patient will: o Check BP 2-3 times weekly, document, and provide at future appointments o Ensure daily salt intake < 2300 mg/day  Hypothyroidism . Pharmacist Clinical Goal(s) o Over the next 60 days, patient will work with PharmD and providers to optimize therapy and minimize symptoms . Current regimen:  o Levothyroxine 50 mcg daily . Interventions: o Reviewed dosing regimen of patient o Reviewed notes from endocrinology o Reviewed signs, symptoms of hypthyroidism . Patient self care activities - Over the next 60 days, patient will: o Report any increased fatigue, hair loss, temperature fluctuations to MD o Take medication consistently each day  Medication management . Pharmacist Clinical Goal(s): o Over the next 60 days, patient will work with PharmD and providers to achieve optimal medication adherence . Current pharmacy: [facility] . Interventions o Comprehensive medication review performed. o Utilize [facility] pharmacy for medication synchronization, packaging and delivery . Patient self care activities - Over the next 60 days, patient will: o Focus on medication adherence by fill dates o Take medications as prescribed o Report any questions or concerns to PharmD and/or provider(s)  Initial goal documentation        Hypertension   BP goal is:  <130/80  Office blood pressures are  BP Readings from Last 3 Encounters:  01/06/20 120/70  01/02/20 (!) 132/99  11/22/19 (!) 146/80   BMP Latest Ref Rng & Units 01/02/2020 01/01/2020 11/22/2019  Glucose 70 - 99 mg/dL 104(H) 127(H) 106(H)  BUN 8 - 23 mg/dL $Remove'11 13 16  'astbQcK$ Creatinine 0.44 - 1.00 mg/dL 0.84 0.82 0.94  BUN/Creat Ratio 12 - 28 - - -  Sodium 135 - 145 mmol/L 141 138 138  Potassium 3.5 - 5.1 mmol/L 3.5 2.7(LL) 4.0  Chloride 98 - 111 mmol/L 107 103 102  CO2 22 - 32 mmol/L $RemoveB'24 23 25  'gijySvtM$ Calcium 8.9 - 10.3 mg/dL 9.2 9.7 9.6    Patient checks BP at home infrequently Patient home BP readings are ranging:  <130/80  Patient has failed these meds in the past: NA Patient is currently controlled on the following medications:  Losartan 25 mg qd Amlodipine 5 mg qd  We discussed diet and exercise extensively Goes out daily, rides stationary bike, walks dog daily. She is very active and enjoys going to lunch or dinner with friends at least weekly. She does not add salt to food. Lisinopril changed to losartan due to cough. Patient reports cough has improved. Patient experienced transient afib/ CP due to hypokalemia in October. Will continue to follow lab values.  Plan  Continue current medications   Hyperlipidemia   LDL goal < 100  Last lipids Lab Results  Component Value Date   CHOL 227 (H) 02/15/2017   HDL 33 (L) 02/15/2017   LDLCALC Comment 02/15/2017   TRIG 534 (H) 02/15/2017   CHOLHDL 6.9 (H) 02/15/2017   Hepatic Function Latest Ref Rng & Units 11/22/2019 05/15/2019 03/06/2019  Total Protein 6.5 - 8.1 g/dL 7.6 7.5 7.7  Albumin 3.5 - 5.0 g/dL 4.3 4.2 4.0  AST 15 -  41 U/L 57(H) 56(H) 62(H)  ALT 0 - 44 U/L 43 46(H) 44  Alk Phosphatase 38 - 126 U/L 64 64 62  Total Bilirubin 0.3 - 1.2 mg/dL 0.8 1.0 0.6     The ASCVD Risk score ([place]., et al., 2013) failed to calculate for the following reasons:   The 2013 ASCVD risk score is only valid for ages 33 to 78   Patient has failed these meds in past: Atorvastatin  Patient is currently query  controlled on the following medications:  . NONE  We discussed:  Patient refuses statins and is not taking Zetia. We reviewed her last labs (2018) vs. Recommended levels. She is not interested in taking anything for her lipids at this time. Counseled patient on increased risk of CV events with elevated lipid levels.   Plan  Continue control with diet and exercise . Continue patient education.     Hypothyroidism   Lab Results  Component Value Date/Time   TSH 4.658 (H) 01/01/2020 06:32 AM   TSH 4.600 (H) 10/17/2019 10:19 AM   TSH 3.240  10/02/2019 09:00 AM    Patient has failed these meds in past: NA Patient is currently query  controlled on the following medications:  . Levothyroxine (Euthyrox) 50 mcg qd  We discussed:  Mrs. Parmenter takes her Euthyrox with breakfast. Counseled her on being consistent with dosing and reporting to endocrinologist Dr. Honor Junes. Patient was diagnosed this year and reports significant hair loss and fatigue prior to therapy. She denies any symptoms today. She saw Dr. Honor Junes yesterday. He plans to keep her on  Hypo-end of normal due to h/o transient afib.  Plan  Continue current medications  GERD   Patient has failed these meds in past: NA Patient is currently controlled on the following medications:  . Omeprazole 20 mg qd  We discussed:  Avoiding trigger foods. Non-pharmacological therapy including elevating head of bed, avoiding food within 3 hours of bedtime. Patient reports   Plan  Continue current medications   DEXA scan no longer requred   Vaccines   Reviewed and discussed patient's vaccination history.    Immunization History  Administered Date(s) Administered  . Fluad Quad(high Dose 65+) 12/25/2019  . Influenza, High Dose Seasonal PF 11/22/2018  . Influenza,inj,Quad PF,6+ Mos 11/07/2017  . Influenza-Unspecified 12/08/2015, 12/12/2016, 11/22/2018  . PFIZER SARS-COV-2 Vaccination 04/04/2019, 04/25/2019  . Pneumococcal Conjugate-13 04/06/2017  . Pneumococcal Polysaccharide-23 12/03/2018  . Zoster Recombinat (Shingrix) 07/26/2017, 10/02/2017    Plan  Patient is up to date on recommended vaccinations..    Medication Management   Patient's preferred pharmacy is:  [facility] [address] [place] - [address] [place] [address] Phone: [phone] Fax: [facility], [address] [place] [address] Phone: [phone] Fax:  [phone]  Uses pill box? No - Doesn't need one Pt endorses 99% compliance  We discussed: Patient is  satisfied with pharmacy services  Plan  Continue current medication management strategy    Follow up: 2  month phone visit  [name]. Kenton Kingfisher PharmD, [facility] [phone]

## 2020-03-13 NOTE — Patient Instructions (Signed)
Visit Information  It was a pleasure speaking with you today! Thank you for letting me be a part of your care team. Please call with any questions or concerns.  Goals Addressed            This Visit's Progress   . Pharmacy Care Plan       CARE PLAN ENTRY (see longitudinal plan of care for additional care plan information)  Current Barriers:  . Chronic Disease Management support, education, and care coordination needs related to Hypertension, Hyperlipidemia, GERD, and Hypothyroidism   Hypertension BP Readings from Last 3 Encounters:  01/06/20 120/70  01/02/20 (!) 132/99  11/22/19 (!) 146/80   . Pharmacist Clinical Goal(s): o Over the next 60 days, patient will work with PharmD and providers to maintain BP goal <130/80 . Current regimen:  o Losartan 25 mg qd o Amlodipine 5 mg qd . Interventions: o Reviewed BP technique and encouraged regular home monitoring o Provided diet and exercise counseling. o Reviewed fill history o Reviewed notes from cardiology . Patient self care activities - Over the next 60 days, patient will: o Check BP 2-3 times weekly, document, and provide at future appointments o Ensure daily salt intake < 2300 mg/day  Hypothyroidism . Pharmacist Clinical Goal(s) o Over the next 60 days, patient will work with PharmD and providers to optimize therapy and minimize symptoms . Current regimen:  o Levothyroxine 50 mcg daily . Interventions: o Reviewed dosing regimen of patient o Reviewed notes from endocrinology o Reviewed signs, symptoms of hypthyroidism . Patient self care activities - Over the next 60 days, patient will: o Report any increased fatigue, hair loss, temperature fluctuations to MD o Take medication consistently each day  Medication management . Pharmacist Clinical Goal(s): o Over the next 60 days, patient will work with PharmD and providers to achieve optimal medication adherence . Current pharmacy:  [facility] . Interventions o Comprehensive medication review performed. o Utilize [facility] pharmacy for medication synchronization, packaging and delivery . Patient self care activities - Over the next 60 days, patient will: o Focus on medication adherence by fill dates o Take medications as prescribed o Report any questions or concerns to PharmD and/or provider(s)  Initial goal documentation        Kaitlyn Oliver was given information about Chronic Care Management services today including:  1. CCM service includes personalized support from designated clinical staff supervised by her physician, including individualized plan of care and coordination with other care providers 2. 24/7 contact phone numbers for assistance for urgent and routine care needs. 3. Standard insurance, coinsurance, copays and deductibles apply for chronic care management only during months in which we provide at least 20 minutes of these services. Most insurances cover these services at 100%, however patients may be responsible for any copay, coinsurance and/or deductible if applicable. This service may help you avoid the need for more expensive face-to-face services. 4. Only one practitioner may furnish and bill the service in a calendar month. 5. The patient may stop CCM services at any time (effective at the end of the month) by phone call to the office staff.  Patient agreed to services and verbal consent obtained.   The patient verbalized understanding of instructions, educational materials, and care plan provided today and agreed to receive a mailed copy of patient instructions, educational materials, and care plan.  The pharmacy team will reach out to the patient again over the next 30 days.   [name]. Kenton Kingfisher PharmD, [place] Clinical Pharmacist [phone]  Hypothyroidism  Hypothyroidism is when the thyroid gland does not make enough of certain hormones (it is underactive). The thyroid gland is a small gland  located in the lower front part of the neck, just in front of the windpipe (trachea). This gland makes hormones that help control how the body uses food for energy (metabolism) as well as how the heart and brain function. These hormones also play a role in keeping your bones strong. When the thyroid is underactive, it produces too little of the hormones thyroxine (T4) and triiodothyronine (T3). What are the causes? This condition may be caused by:  Hashimoto's disease. This is a disease in which the body's disease-fighting system (immune system) attacks the thyroid gland. This is the most common cause.  Viral infections.  Pregnancy.  Certain medicines.  Birth defects.  Past radiation treatments to the head or neck for cancer.  Past treatment with radioactive iodine.  Past exposure to radiation in the environment.  Past surgical removal of part or all of the thyroid.  Problems with a gland in the center of the brain (pituitary gland).  Lack of enough iodine in the diet. What increases the risk? You are more likely to develop this condition if:  You are female.  You have a family history of thyroid conditions.  You use a medicine called lithium.  You take medicines that affect the immune system (immunosuppressants). What are the signs or symptoms? Symptoms of this condition include:  Feeling as though you have no energy (lethargy).  Not being able to tolerate cold.  Weight gain that is not explained by a change in diet or exercise habits.  Lack of appetite.  Dry skin.  Coarse hair.  Menstrual irregularity.  Slowing of thought processes.  Constipation.  Sadness or depression. How is this diagnosed? This condition may be diagnosed based on:  Your symptoms, your medical history, and a physical exam.  Blood tests. You may also have imaging tests, such as an ultrasound or MRI. How is this treated? This condition is treated with medicine that replaces the  thyroid hormones that your body does not make. After you begin treatment, it may take several weeks for symptoms to go away. Follow these instructions at home:  Take over-the-counter and prescription medicines only as told by your health care provider.  If you start taking any new medicines, tell your health care provider.  Keep all follow-up visits as told by your health care provider. This is important. ? As your condition improves, your dosage of thyroid hormone medicine may change. ? You will need to have blood tests regularly so that your health care provider can monitor your condition. Contact a health care provider if:  Your symptoms do not get better with treatment.  You are taking thyroid replacement medicine and you: ? Sweat a lot. ? Have tremors. ? Feel anxious. ? Lose weight rapidly. ? Cannot tolerate heat. ? Have emotional swings. ? Have diarrhea. ? Feel weak. Get help right away if you have:  Chest pain.  An irregular heartbeat.  A rapid heartbeat.  Difficulty breathing. Summary  Hypothyroidism is when the thyroid gland does not make enough of certain hormones (it is underactive).  When the thyroid is underactive, it produces too little of the hormones thyroxine (T4) and triiodothyronine (T3).  The most common cause is Hashimoto's disease, a disease in which the body's disease-fighting system (immune system) attacks the thyroid gland. The condition can also be caused by viral infections, medicine, pregnancy, or  past radiation treatment to the head or neck.  Symptoms may include weight gain, dry skin, constipation, feeling as though you do not have energy, and not being able to tolerate cold.  This condition is treated with medicine to replace the thyroid hormones that your body does not make. This information is not intended to replace advice given to you by your health care provider. Make sure you discuss any questions you have with your health care  provider. Document Revised: 02/10/2017 Document Reviewed: 02/08/2017 Elsevier Patient Education  2020 [employer].

## 2020-03-16 NOTE — Progress Notes (Signed)
I, Jonovan Boedecker, MD, have reviewed all documentation for this visit. The documentation on 03/16/20 for the exam, diagnosis, procedures, and orders are all accurate and complete. 

## 2020-04-28 DIAGNOSIS — I712 Thoracic aortic aneurysm, without rupture: Secondary | ICD-10-CM | POA: Diagnosis not present

## 2020-04-28 DIAGNOSIS — I10 Essential (primary) hypertension: Secondary | ICD-10-CM | POA: Diagnosis not present

## 2020-04-28 DIAGNOSIS — Z96651 Presence of right artificial knee joint: Secondary | ICD-10-CM | POA: Diagnosis not present

## 2020-04-28 DIAGNOSIS — E782 Mixed hyperlipidemia: Secondary | ICD-10-CM | POA: Diagnosis not present

## 2020-04-28 DIAGNOSIS — R002 Palpitations: Secondary | ICD-10-CM | POA: Diagnosis not present

## 2020-04-28 DIAGNOSIS — R600 Localized edema: Secondary | ICD-10-CM | POA: Insufficient documentation

## 2020-04-28 DIAGNOSIS — I480 Paroxysmal atrial fibrillation: Secondary | ICD-10-CM | POA: Diagnosis not present

## 2020-05-07 ENCOUNTER — Ambulatory Visit (INDEPENDENT_AMBULATORY_CARE_PROVIDER_SITE_OTHER): Payer: Medicare HMO | Admitting: Pharmacist

## 2020-05-07 DIAGNOSIS — E034 Atrophy of thyroid (acquired): Secondary | ICD-10-CM

## 2020-05-07 DIAGNOSIS — I10 Essential (primary) hypertension: Secondary | ICD-10-CM | POA: Diagnosis not present

## 2020-05-07 NOTE — Progress Notes (Signed)
Chronic Care Management Pharmacy Note  05/07/2020 Name:  Kaitlyn Oliver MRN:  [id number] DOB:  02/09/1934  Subjective: Kaitlyn Oliver is an 87 y.o. year old female who is a primary patient of Juline Patch, MD.  The CCM team was consulted for assistance with disease management and care coordination needs.    Engaged with patient by telephone for follow up visit in response to provider referral for pharmacy case management and/or care coordination services.   Consent to Services:  The patient was given information about Chronic Care Management services, agreed to services, and gave verbal consent prior to initiation of services.  Please see initial visit note for detailed documentation.   Patient Care Team: Juline Patch, MD as PCP - General (Family Medicine) Vladimir Faster, [facility] (Pharmacist)   Recent consult visits: 04/28/20- Dr. Saralyn Pilar, Cardiology- Holter mon. Review- continue curretn meds, defer anticoag, DASH diet  Hospital visits: October 2021   Objective:  Lab Results  Component Value Date   CREATININE 0.84 01/02/2020   BUN 11 01/02/2020   GFRNONAA >60 01/02/2020   GFRAA >60 11/22/2019   NA 141 01/02/2020   K 3.5 01/02/2020   CALCIUM 9.2 01/02/2020   CO2 24 01/02/2020    Lab Results  Component Value Date/Time   HGBA1C 5.6 01/01/2020 02:31 PM    Last diabetic Eye exam: No results found for: HMDIABEYEEXA  Last diabetic Foot exam: No results found for: HMDIABFOOTEX   Lab Results  Component Value Date   CHOL 227 (H) 02/15/2017   HDL 33 (L) 02/15/2017   LDLCALC Comment 02/15/2017   TRIG 534 (H) 02/15/2017   CHOLHDL 6.9 (H) 02/15/2017    Hepatic Function Latest Ref Rng & Units 11/22/2019 05/15/2019 03/06/2019  Total Protein 6.5 - 8.1 g/dL 7.6 7.5 7.7  Albumin 3.5 - 5.0 g/dL 4.3 4.2 4.0  AST 15 - 41 U/L 57(H) 56(H) 62(H)  ALT 0 - 44 U/L 43 46(H) 44  Alk Phosphatase 38 - 126 U/L 64 64 62  Total Bilirubin 0.3 - 1.2 mg/dL 0.8 1.0 0.6    Lab  Results  Component Value Date/Time   TSH 4.658 (H) 01/01/2020 06:32 AM   TSH 4.600 (H) 10/17/2019 10:19 AM   TSH 3.240 10/02/2019 09:00 AM    CBC Latest Ref Rng & Units 01/02/2020 01/01/2020 11/22/2019  WBC 4.0 - 10.5 K/uL 5.1 4.3 5.2  Hemoglobin 12.0 - 15.0 g/dL 11.9(L) 13.1 12.6  Hematocrit 36.0 - 46.0 % 35.4(L) 38.4 37.7  Platelets 150 - 400 K/uL 150 141(L) 150    No results found for: VD25OH  Clinical ASCVD: Yes  as seen on chest CT The ASCVD Risk score Mikey Bussing DC Jr., et al., 2013) failed to calculate for the following reasons:   The 2013 ASCVD risk score is only valid for ages 34 to 84    Depression screen PHQ 2/9 01/06/2020 12/02/2019 09/05/2019  Decreased Interest 0 0 0  Down, Depressed, Hopeless 0 0 1  PHQ - 2 Score 0 0 1  Altered sleeping 1 - 0  Tired, decreased energy 0 - 0  Change in appetite 0 - 0  Feeling bad or failure about yourself  0 - 0  Trouble concentrating 0 - 0  Moving slowly or fidgety/restless 0 - 0  Suicidal thoughts 0 - 0  PHQ-9 Score 1 - 1  Difficult doing work/chores Not difficult at all - Not difficult at all  Some recent data might be hidden  CHA2DS2-VASc Score = 5  The patient's score is based upon: CHF History: No HTN History: Yes Diabetes History: No Stroke History: No Vascular Disease History: Yes Age Score: 2 Gender Score: 1  {  Social History   Tobacco Use  Smoking Status Former Smoker  . Packs/day: 0.25  . Years: 10.00  . Pack years: 2.50  . Types: Cigarettes  . Quit date: 35  . Years since quitting: 31.1  Smokeless Tobacco Never Used  Tobacco Comment   smoking cessation materials not required   BP Readings from Last 3 Encounters:  01/06/20 120/70  01/02/20 (!) 132/99  11/22/19 (!) 146/80   Pulse Readings from Last 3 Encounters:  01/06/20 80  01/02/20 80  11/22/19 60   Wt Readings from Last 3 Encounters:  01/06/20 198 lb (89.8 kg)  01/01/20 186 lb 3.2 oz (84.5 kg)  11/22/19 194 lb 9.6 oz (88.3 kg)     Assessment/Interventions: Review of patient past medical history, allergies, medications, health status, including review of consultants reports, laboratory and other test data, was performed as part of comprehensive evaluation and provision of chronic care management services.   SDOH:  (Social Determinants of Health) assessments and interventions performed: No    Immunization History  Administered Date(s) Administered  . Fluad Quad(high Dose 65+) 12/25/2019  . Influenza, High Dose Seasonal PF 11/22/2018  . Influenza,inj,Quad PF,6+ Mos 11/07/2017  . Influenza-Unspecified 12/08/2015, 12/12/2016, 11/22/2018  . PFIZER(Purple Top)SARS-COV-2 Vaccination 04/04/2019, 04/25/2019  . Pneumococcal Conjugate-13 04/06/2017  . Pneumococcal Polysaccharide-23 12/03/2018  . Zoster Recombinat (Shingrix) 07/26/2017, 10/02/2017    Conditions to be addressed/monitored:  Hypertension, Hyperlipidemia, Hypothyroidism and remote breast cancer with bilateral mastectomy, neuroendocrine carcinome SI s/p resection, thoracoabdominal aortic aneurysm  Care Plan : [place]  Updates made by Vladimir Faster, [place] since 05/07/2020 12:00 AM    Problem: HTN, Hypothyroidism, GERD   Priority: High    Long-Range Goal: Disease Management   Start Date: 05/07/2020  This Visit's Progress: On track  Note:   Current Barriers:  . Unable to independently monitor therapeutic efficacy  Pharmacist Clinical Goal(s):  Marland Kitchen Over the next 90 days, patient will achieve adherence to monitoring guidelines and medication adherence to achieve therapeutic efficacy through collaboration with PharmD and provider.    Interventions: . 1:1 collaboration with Juline Patch, MD regarding development and update of comprehensive plan of care as evidenced by provider attestation and co-signature . Inter-disciplinary care team collaboration (see longitudinal plan of care) . Comprehensive medication review performed; medication list  updated in electronic medical record  Hypertension (BP goal <130/80) -Controlled -Current treatment: . Amlodipine 10 mg qd -Medications previously tried: losartan patient states she was unable to tolerate -Current home readings: 130s/60s -Current dietary habits: following low sodium diet -Current exercise habits: active but not structured exercise -Denies hypotensive/hypertensive symptoms -Educated on Daily salt intake goal < 2300 mg; Proper BP monitoring technique; Symptoms of hypotension and importance of maintaining adequate hydration; -Counseled to monitor BP at home 2-3 times weekly, document, and provide log at future appointments -Counseled on diet and exercise extensively Recommended to continue current medication .  Hyperlipidemia: (LDL goal < 100) -Not ideally controlled -Current treatment: . NONE -Medications previously tried: zetia -Educated on Importance of limiting foods high in cholesterol; Exercise goal of 150 minutes per week; -Recommend continue diet and exercise  Hypothyroidism (Goal: minimize symptoms avoid complications) -Controlled -Current treatment  . Levothyroxine 50 mcg qd -Medications previously tried: NA  -Recommended to continue current medication  GERD (Goal: minimize symptoms avoid side effects) -Controlled -Current treatment  . Omeprazole 20 mg qd -Medications previously tried: NA  -Recommended to continue current medication Counseled on non-pharmacotherapeutic interventions      Patient Goals/Self-Care Activities . Over the next 90 days, patient will:  - take medications as prescribed check blood pressure 2-3 times weekly, document, and provide at future appointments engage in dietary modifications by continuing low-sodium diet  Follow Up Plan: Telephone follow up appointment with care management team member scheduled for: 08/04/20      Medication Assistance: None required.  Patient affirms current coverage meets  needs.  Patient's preferred pharmacy is: [facility]--she plans to switch all medications from [place]  [facility]. Instructed patient on transfer process.  Uses pill box? No - doesn't need Pt endorses 99% compliance  We discussed: Benefits of medication synchronization, packaging and delivery as well as enhanced pharmacist oversight with [employer]. Patient decided to: Continue current medication management strategy  Care Plan and Follow Up Patient Decision:  Patient agrees to Care Plan and Follow-up.  Plan: Telephone follow up appointment with care management team member scheduled for:  3 months   [name]. Kenton Kingfisher PharmD, [facility] [phone]

## 2020-05-07 NOTE — Patient Instructions (Signed)
Visit Information  It was a pleasure speaking with you today. Thank you for letting me be part of your clinical team. Please call with any questions or concerns.   Goals Addressed            This Visit's Progress   . Lifestyle Change-Hypertension       Timeframe:  Long-Range Goal Priority:  High Start Date:                             Expected End Date:                       Follow Up Date 08/04/20    - agree to work together to make changes - learn about high blood pressure     Why is this important?    The changes that you are asked to make may be hard to do.   This is especially true when the changes are life-long.   Knowing why it is important to you is the first step.   Working on the change with your family or support person helps you not feel alone.   Reward yourself and family or support person when goals are met. This can be an activity you choose like bowling, hiking, biking, swimming or shooting hoops.     Notes:     Marland Kitchen Manage My Medicine       Timeframe:  Long-Range Goal Priority:  Medium Start Date:                             Expected End Date:                       Follow Up Date 08/04/20    - call for medicine refill 2 or 3 days before it runs out - call if I am sick and can't take my medicine - keep a list of all the medicines I take; vitamins and herbals too - use a pillbox to sort medicine    Why is this important?   . These steps will help you keep on track with your medicines.   Notes: Patient plans to transfer all medications to [facility]. Requesting refill levothyroxine--not needed per chart should have 1 year supply at [facility] Patient informed of transfer process and voiced understanding.    . Track and Manage My Blood Pressure-Hypertension       Timeframe:  Long-Range Goal Priority:  High Start Date:                             Expected End Date:                       Follow Up Date 08/04/20    - check blood pressure 3 times per  week - write blood pressure results in a log or diary    Why is this important?    You won't feel high blood pressure, but it can still hurt your blood vessels.   High blood pressure can cause heart or kidney problems. It can also cause a stroke.   Making lifestyle changes like losing a little weight or eating less salt will help.   Checking your blood pressure at home and at different times of the day can help to  control blood pressure.   If the doctor prescribes medicine remember to take it the way the doctor ordered.   Call the office if you cannot afford the medicine or if there are questions about it.     Notes:        The patient verbalized understanding of instructions, educational materials, and care plan provided today and agreed to receive a mailed copy of patient instructions, educational materials, and care plan.   Telephone follow up appointment with pharmacy team member scheduled for: 3 months   [name]. Timothey Dahlstrom PharmD, BCPS Clinical Pharmacist [phone]  Hypothyroidism  Hypothyroidism is when the thyroid gland does not make enough of certain hormones (it is underactive). The thyroid gland is a small gland located in the lower front part of the neck, just in front of the windpipe (trachea). This gland makes hormones that help control how the body uses food for energy (metabolism) as well as how the heart and brain function. These hormones also play a role in keeping your bones strong. When the thyroid is underactive, it produces too little of the hormones thyroxine (T4) and triiodothyronine (T3). What are the causes? This condition may be caused by:  Hashimoto's disease. This is a disease in which the body's disease-fighting system (immune system) attacks the thyroid gland. This is the most common cause.  Viral infections.  Pregnancy.  Certain medicines.  Birth defects.  Past radiation treatments to the head or neck for cancer.  Past treatment with  radioactive iodine.  Past exposure to radiation in the environment.  Past surgical removal of part or all of the thyroid.  Problems with a gland in the center of the brain (pituitary gland).  Lack of enough iodine in the diet. What increases the risk? You are more likely to develop this condition if:  You are female.  You have a family history of thyroid conditions.  You use a medicine called lithium.  You take medicines that affect the immune system (immunosuppressants). What are the signs or symptoms? Symptoms of this condition include:  Feeling as though you have no energy (lethargy).  Not being able to tolerate cold.  Weight gain that is not explained by a change in diet or exercise habits.  Lack of appetite.  Dry skin.  Coarse hair.  Menstrual irregularity.  Slowing of thought processes.  Constipation.  Sadness or depression. How is this diagnosed? This condition may be diagnosed based on:  Your symptoms, your medical history, and a physical exam.  Blood tests. You may also have imaging tests, such as an ultrasound or MRI. How is this treated? This condition is treated with medicine that replaces the thyroid hormones that your body does not make. After you begin treatment, it may take several weeks for symptoms to go away. Follow these instructions at home:  Take over-the-counter and prescription medicines only as told by your health care provider.  If you start taking any new medicines, tell your health care provider.  Keep all follow-up visits as told by your health care provider. This is important. ? As your condition improves, your dosage of thyroid hormone medicine may change. ? You will need to have blood tests regularly so that your health care provider can monitor your condition. Contact a health care provider if:  Your symptoms do not get better with treatment.  You are taking thyroid hormone replacement medicine and you: ? Sweat a  lot. ? Have tremors. ? Feel anxious. ? Lose weight rapidly. ? Cannot  tolerate heat. ? Have emotional swings. ? Have diarrhea. ? Feel weak. Get help right away if you have:  Chest pain.  An irregular heartbeat.  A rapid heartbeat.  Difficulty breathing. Summary  Hypothyroidism is when the thyroid gland does not make enough of certain hormones (it is underactive).  When the thyroid is underactive, it produces too little of the hormones thyroxine (T4) and triiodothyronine (T3).  The most common cause is Hashimoto's disease, a disease in which the body's disease-fighting system (immune system) attacks the thyroid gland. The condition can also be caused by viral infections, medicine, pregnancy, or past radiation treatment to the head or neck.  Symptoms may include weight gain, dry skin, constipation, feeling as though you do not have energy, and not being able to tolerate cold.  This condition is treated with medicine to replace the thyroid hormones that your body does not make. This information is not intended to replace advice given to you by your health care provider. Make sure you discuss any questions you have with your health care provider. Document Revised: 11/29/2019 Document Reviewed: 11/14/2019 Elsevier Patient Education  2021 [employer].

## 2020-05-20 ENCOUNTER — Ambulatory Visit
Admission: RE | Admit: 2020-05-20 | Discharge: 2020-05-20 | Disposition: A | Discharge status: Home / Self Care | Payer: Medicare HMO | Source: Ambulatory Visit | Attending: Oncology | Admitting: Oncology

## 2020-05-20 ENCOUNTER — Inpatient Hospital Stay: Payer: Medicare HMO | Attending: Oncology

## 2020-05-20 ENCOUNTER — Other Ambulatory Visit: Payer: Self-pay

## 2020-05-20 DIAGNOSIS — R7989 Other specified abnormal findings of blood chemistry: Secondary | ICD-10-CM | POA: Diagnosis not present

## 2020-05-20 DIAGNOSIS — K7689 Other specified diseases of liver: Secondary | ICD-10-CM | POA: Diagnosis not present

## 2020-05-20 DIAGNOSIS — D3A019 Benign carcinoid tumor of the small intestine, unspecified portion: Secondary | ICD-10-CM | POA: Diagnosis not present

## 2020-05-20 DIAGNOSIS — C7A8 Other malignant neuroendocrine tumors: Secondary | ICD-10-CM

## 2020-05-20 DIAGNOSIS — I2510 Atherosclerotic heart disease of native coronary artery without angina pectoris: Secondary | ICD-10-CM | POA: Diagnosis not present

## 2020-05-20 DIAGNOSIS — K862 Cyst of pancreas: Secondary | ICD-10-CM | POA: Diagnosis not present

## 2020-05-20 DIAGNOSIS — C7A019 Malignant carcinoid tumor of the small intestine, unspecified portion: Secondary | ICD-10-CM | POA: Insufficient documentation

## 2020-05-20 DIAGNOSIS — I700 Atherosclerosis of aorta: Secondary | ICD-10-CM | POA: Diagnosis not present

## 2020-05-20 LAB — CBC WITH DIFFERENTIAL/PLATELET
Abs Immature Granulocytes: 0.01 10*3/uL (ref 0.00–0.07)
Basophils Absolute: 0 10*3/uL (ref 0.0–0.1)
Basophils Relative: 1 %
Eosinophils Absolute: 0.1 10*3/uL (ref 0.0–0.5)
Eosinophils Relative: 2 %
HCT: 39.5 % (ref 36.0–46.0)
Hemoglobin: 13.2 g/dL (ref 12.0–15.0)
Immature Granulocytes: 0 %
Lymphocytes Relative: 33 %
Lymphs Abs: 1.7 10*3/uL (ref 0.7–4.0)
MCH: 30.3 pg (ref 26.0–34.0)
MCHC: 33.4 g/dL (ref 30.0–36.0)
MCV: 90.6 fL (ref 80.0–100.0)
Monocytes Absolute: 0.4 10*3/uL (ref 0.1–1.0)
Monocytes Relative: 8 %
Neutro Abs: 2.9 10*3/uL (ref 1.7–7.7)
Neutrophils Relative %: 56 %
Platelets: 159 10*3/uL (ref 150–400)
RBC: 4.36 MIL/uL (ref 3.87–5.11)
RDW: 14.7 % (ref 11.5–15.5)
WBC: 5.1 10*3/uL (ref 4.0–10.5)
nRBC: 0 % (ref 0.0–0.2)

## 2020-05-20 LAB — COMPREHENSIVE METABOLIC PANEL
ALT: 49 U/L — ABNORMAL HIGH (ref 0–44)
AST: 59 U/L — ABNORMAL HIGH (ref 15–41)
Albumin: 4.4 g/dL (ref 3.5–5.0)
Alkaline Phosphatase: 68 U/L (ref 38–126)
Anion gap: 12 (ref 5–15)
BUN: 9 mg/dL (ref 8–23)
CO2: 25 mmol/L (ref 22–32)
Calcium: 10.2 mg/dL (ref 8.9–10.3)
Chloride: 103 mmol/L (ref 98–111)
Creatinine, Ser: 0.91 mg/dL (ref 0.44–1.00)
GFR, Estimated: >60 mL/min (ref >60)
Glucose, Bld: 119 mg/dL — ABNORMAL HIGH (ref 70–99)
Potassium: 3.7 mmol/L (ref 3.5–5.1)
Sodium: 140 mmol/L (ref 135–145)
Total Bilirubin: 0.8 mg/dL (ref 0.3–1.2)
Total Protein: 7.8 g/dL (ref 6.5–8.1)

## 2020-05-20 MED ORDER — IOHEXOL 300 MG/ML  SOLN
100.0000 mL | Freq: Once | INTRAMUSCULAR | Status: AC | PRN
  Administered 2020-05-20: 100 mL via INTRAVENOUS

## 2020-05-21 ENCOUNTER — Inpatient Hospital Stay: Payer: Medicare HMO | Admitting: Oncology

## 2020-05-21 LAB — CHROMOGRANIN A: Chromogranin A (ng/mL): 429.7 ng/mL — ABNORMAL HIGH (ref 0.0–101.8)

## 2020-05-23 DIAGNOSIS — I4891 Unspecified atrial fibrillation: Secondary | ICD-10-CM | POA: Diagnosis not present

## 2020-05-23 DIAGNOSIS — K219 Gastro-esophageal reflux disease without esophagitis: Secondary | ICD-10-CM | POA: Diagnosis not present

## 2020-05-23 DIAGNOSIS — I10 Essential (primary) hypertension: Secondary | ICD-10-CM | POA: Diagnosis not present

## 2020-05-23 DIAGNOSIS — E039 Hypothyroidism, unspecified: Secondary | ICD-10-CM | POA: Diagnosis not present

## 2020-05-23 DIAGNOSIS — E669 Obesity, unspecified: Secondary | ICD-10-CM | POA: Diagnosis not present

## 2020-05-23 DIAGNOSIS — R32 Unspecified urinary incontinence: Secondary | ICD-10-CM | POA: Diagnosis not present

## 2020-05-23 DIAGNOSIS — J302 Other seasonal allergic rhinitis: Secondary | ICD-10-CM | POA: Diagnosis not present

## 2020-05-23 DIAGNOSIS — Z6832 Body mass index (BMI) 32.0-32.9, adult: Secondary | ICD-10-CM | POA: Diagnosis not present

## 2020-05-23 DIAGNOSIS — D6869 Other thrombophilia: Secondary | ICD-10-CM | POA: Diagnosis not present

## 2020-05-23 DIAGNOSIS — M1990 Unspecified osteoarthritis, unspecified site: Secondary | ICD-10-CM | POA: Diagnosis not present

## 2020-05-25 ENCOUNTER — Ambulatory Visit: Payer: Medicare HMO | Admitting: Dermatology

## 2020-06-11 DIAGNOSIS — R600 Localized edema: Secondary | ICD-10-CM | POA: Diagnosis not present

## 2020-06-11 DIAGNOSIS — I480 Paroxysmal atrial fibrillation: Secondary | ICD-10-CM | POA: Diagnosis not present

## 2020-06-11 DIAGNOSIS — I10 Essential (primary) hypertension: Secondary | ICD-10-CM | POA: Diagnosis not present

## 2020-06-11 DIAGNOSIS — Z79899 Other long term (current) drug therapy: Secondary | ICD-10-CM | POA: Diagnosis not present

## 2020-06-11 DIAGNOSIS — R0789 Other chest pain: Secondary | ICD-10-CM | POA: Diagnosis not present

## 2020-06-25 ENCOUNTER — Other Ambulatory Visit: Payer: Self-pay

## 2020-06-25 ENCOUNTER — Ambulatory Visit (INDEPENDENT_AMBULATORY_CARE_PROVIDER_SITE_OTHER): Payer: Medicare HMO | Admitting: Family Medicine

## 2020-06-25 ENCOUNTER — Encounter: Payer: Self-pay | Admitting: Family Medicine

## 2020-06-25 VITALS — BP 140/86 | HR 70 | Temp 98.1°F | Ht 65.0 in | Wt 194.0 lb

## 2020-06-25 DIAGNOSIS — J0100 Acute maxillary sinusitis, unspecified: Secondary | ICD-10-CM | POA: Diagnosis not present

## 2020-06-25 MED ORDER — DOXYCYCLINE HYCLATE 100 MG PO TABS: 100.0000 mg | ORAL_TABLET | Freq: Two times a day (BID) | ORAL | 0 refills | Status: DC

## 2020-06-25 NOTE — Progress Notes (Signed)
Date:  06/25/2020   Name:  Kaitlyn Oliver   DOB:  1933-07-23   MRN:  [id number]   Chief Complaint: Sinusitis (X1 week, cough (green mucous), fatigue, nauseous, no headache or fever, pressure in cheeks)  Sinusitis This is a new problem. The current episode started in the past 7 days. The problem has been gradually worsening since onset. There has been no fever. The pain is mild. Associated symptoms include congestion, a hoarse voice and sinus pressure. Pertinent negatives include no chills, coughing, diaphoresis, ear pain, headaches, neck pain, shortness of breath, sneezing, sore throat or swollen glands. The treatment provided moderate relief.    Lab Results  Component Value Date   CREATININE 0.91 05/20/2020   BUN 9 05/20/2020   NA 140 05/20/2020   K 3.7 05/20/2020   CL 103 05/20/2020   CO2 25 05/20/2020   Lab Results  Component Value Date   CHOL 227 (H) 02/15/2017   HDL 33 (L) 02/15/2017   LDLCALC Comment 02/15/2017   TRIG 534 (H) 02/15/2017   CHOLHDL 6.9 (H) 02/15/2017   Lab Results  Component Value Date   TSH 4.658 (H) 01/01/2020   Lab Results  Component Value Date   HGBA1C 5.6 01/01/2020   Lab Results  Component Value Date   WBC 5.1 05/20/2020   HGB 13.2 05/20/2020   HCT 39.5 05/20/2020   MCV 90.6 05/20/2020   PLT 159 05/20/2020   Lab Results  Component Value Date   ALT 49 (H) 05/20/2020   AST 59 (H) 05/20/2020   ALKPHOS 68 05/20/2020   BILITOT 0.8 05/20/2020     Review of Systems  Constitutional: Negative.  Negative for chills, diaphoresis, fatigue, fever and unexpected weight change.  HENT: Positive for congestion, hoarse voice and sinus pressure. Negative for ear discharge, ear pain, rhinorrhea, sneezing and sore throat.   Eyes: Negative for photophobia, pain, discharge, redness and itching.  Respiratory: Negative for cough, shortness of breath, wheezing and stridor.   Gastrointestinal: Negative for abdominal pain, blood in stool, constipation,  diarrhea, nausea and vomiting.  Endocrine: Negative for cold intolerance, heat intolerance, polydipsia, polyphagia and polyuria.  Genitourinary: Negative for dysuria, flank pain, frequency, hematuria, menstrual problem, pelvic pain, urgency, vaginal bleeding and vaginal discharge.  Musculoskeletal: Negative for arthralgias, back pain, myalgias and neck pain.  Skin: Negative for rash.  Allergic/Immunologic: Negative for environmental allergies and food allergies.  Neurological: Negative for dizziness, weakness, light-headedness, numbness and headaches.  Hematological: Negative for adenopathy. Does not bruise/bleed easily.  Psychiatric/Behavioral: Negative for dysphoric mood. The patient is not nervous/anxious.     Patient Active Problem List   Diagnosis Date Noted  . Chest pain 01/01/2020  . Atrial fibrillation, new onset ([place]) 01/01/2020  . Elevated troponin 01/01/2020  . CAD (coronary artery disease) 01/01/2020  . Hypokalemia 08/20/2018  . 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 Goals of care, counseling/discussion 01/26/2018  . Small bowel tumor   . Pancreatic cyst 01/15/2018  . Anxiety and depression 01/15/2018  . Pancreas cyst 01/15/2018  . Bowel obstruction ([place]) 11/29/2017  . Small bowel obstruction ([place])   . Thoracoabdominal aortic aneurysm (TAAA) ([place]) 11/19/2017  . Status post total knee replacement using cement, right 05/30/2017  . GERD (gastroesophageal reflux disease) 12/22/2015  . Adenomatous polyp 09/11/2014  . Bilateral cataracts 09/11/2014  . Gastric catarrh 09/11/2014  . Gastroduodenal ulcer 09/11/2014  . Hypothyroidism 09/11/2014  . Cardiac murmur 10/08/2013  . Essential (primary) hypertension 10/08/2013  . Combined fat  and carbohydrate induced hyperlipemia 10/08/2013    Allergies  Allergen Reactions  . Adhesive [Tape] Dermatitis    Can not use plastic tape. Paper tape ok   . Amlodipine Other (See Comments)    Trouble swallowing  .  Asa [Aspirin]     Itchy   . Penicillin G Itching    Has patient had a PCN reaction causing immediate rash, facial/tongue/throat swelling, SOB or lightheadedness with hypotension: No Has patient had a PCN reaction causing severe rash involving mucus membranes or skin necrosis: No Has patient had a PCN reaction that required hospitalization: No Has patient had a PCN reaction occurring within the last 10 years: No If all of the above answers are "NO", then may proceed with Cephalosporin use.   Marland Kitchen Hydrocodone-Acetaminophen Itching and Rash    Past Surgical History:  Procedure Laterality Date  . BOWEL RESECTION  01/17/2018   Procedure: SMALL BOWEL RESECTION;  Surgeon: Jules Husbands, MD;  Location: [facility];  Service: General;;  . BREAST SURGERY Bilateral 1991   mastectomy  . CATARACT EXTRACTION Bilateral   . CHOLECYSTECTOMY    . COLONOSCOPY  2013   Dr Gustavo Lah  . HALLUX VALGUS AUSTIN Right 06/03/2015   Procedure: HALLUX VALGUS AUSTIN;  Surgeon: Samara Deist, DPM;  Location: [place];  Service: Podiatry;  Laterality: Right;  WITH POPLITEAL  . KNEE ARTHROSCOPY WITH MEDIAL MENISECTOMY Right 08/30/2016   Procedure: KNEE ARTHROSCOPY WITH PARTIAL MEDIAL MENISECTOMY and Partial Lateral Menisectomy;  Surgeon: Hessie Knows, MD;  Location: [facility];  Service: Orthopedics;  Laterality: Right;  . KNEE ARTHROSCOPY WITH SUBCHONDROPLASTY Right 08/30/2016   Procedure: KNEE ARTHROSCOPY WITH SUBCHONDROPLASTY;  Surgeon: Hessie Knows, MD;  Location: [facility];  Service: Orthopedics;  Laterality: Right;  . LAPAROTOMY N/A 01/17/2018   Procedure: EXPLORATORY LAPAROTOMY;  Surgeon: Jules Husbands, MD;  Location: [facility];  Service: General;  Laterality: N/A;  . MASTECTOMY Bilateral   . ROTATOR CUFF REPAIR Bilateral   . TONSILLECTOMY    . TOTAL KNEE ARTHROPLASTY Left   . TOTAL KNEE ARTHROPLASTY Right 05/30/2017   Procedure: TOTAL KNEE ARTHROPLASTY;  Surgeon: Hessie Knows, MD;  Location: [facility];   Service: Orthopedics;  Laterality: Right;  Marland Kitchen VAGINAL HYSTERECTOMY      Social History   Tobacco Use  . Smoking status: Former Smoker    Packs/day: 0.25    Years: 10.00    Pack years: 2.50    Types: Cigarettes    Quit date: 1991    Years since quitting: 31.3  . Smokeless tobacco: Never Used  . Tobacco comment: smoking cessation materials not required  Vaping Use  . Vaping Use: Never used  Substance Use Topics  . Alcohol use: No    Alcohol/week: 0.0 standard drinks  . Drug use: No     Medication list has been reviewed and updated.  Current Meds  Medication Sig  . amLODipine (NORVASC) 5 MG tablet Take 10 mg by mouth daily.  Arna Medici 50 MCG tablet Take 50 mcg by mouth daily.  . hydrochlorothiazide (HYDRODIURIL) 12.5 MG tablet Take 12.5 mg by mouth daily.  Marland Kitchen losartan (COZAAR) 25 MG tablet Take 25 mg by mouth daily.  Marland Kitchen omeprazole (PRILOSEC) 20 MG capsule Take 1 capsule (20 mg total) by mouth daily.  . potassium chloride (KLOR-CON) 10 MEQ tablet Take by mouth.    PHQ 2/9 Scores 06/25/2020 01/06/2020 12/02/2019 09/05/2019  PHQ - 2 Score 2 0 0 1  PHQ- 9 Score 10 1 - 1  GAD 7 : Generalized Anxiety Score 06/25/2020 01/06/2020 09/05/2019 09/03/2019  Nervous, Anxious, on Edge 1 0 0 0  Control/stop worrying 1 0 0 0  Worry too much - different things 1 0 0 0  Trouble relaxing 0 0 0 0  Restless 0 0 0 0  Easily annoyed or irritable 1 0 2 0  Afraid - awful might happen 0 0 0 0  Total GAD 7 Score 4 0 2 0  Anxiety Difficulty - - Not difficult at all -    BP Readings from Last 3 Encounters:  06/25/20 140/86  01/06/20 120/70  01/02/20 (!) 132/99    Physical Exam Vitals and nursing note reviewed.  Constitutional:      Appearance: She is well-developed.  HENT:     Head: Normocephalic.     Right Ear: Tympanic membrane, ear canal and external ear normal. There is no impacted cerumen.     Left Ear: Tympanic membrane, ear canal and external ear normal. There is no impacted  cerumen.     Nose: Nose normal. No congestion or rhinorrhea.     Mouth/Throat:     Mouth: Mucous membranes are moist.  Eyes:     General: Lids are everted, no foreign bodies appreciated. No scleral icterus.       Left eye: No foreign body or hordeolum.     Conjunctiva/sclera: Conjunctivae normal.     Right eye: Right conjunctiva is not injected.     Left eye: Left conjunctiva is not injected.     Pupils: Pupils are equal, round, and reactive to light.  Neck:     Thyroid: No thyromegaly.     Vascular: No JVD.     Trachea: No tracheal deviation.  Cardiovascular:     Rate and Rhythm: Normal rate and regular rhythm.     Heart sounds: Normal heart sounds. No murmur heard. No friction rub. No gallop.   Pulmonary:     Effort: Pulmonary effort is normal. No respiratory distress.     Breath sounds: Normal breath sounds. No wheezing, rhonchi or rales.  Abdominal:     General: Bowel sounds are normal.     Palpations: Abdomen is soft. There is no mass.     Tenderness: There is no abdominal tenderness. There is no guarding or rebound.  Musculoskeletal:        General: No tenderness. Normal range of motion.     Cervical back: Normal range of motion and neck supple.  Lymphadenopathy:     Cervical: No cervical adenopathy.  Skin:    General: Skin is warm.     Findings: No rash.  Neurological:     Mental Status: She is alert and oriented to person, place, and time.     Cranial Nerves: No cranial nerve deficit.     Deep Tendon Reflexes: Reflexes normal.  Psychiatric:        Mood and Affect: Mood is not anxious or depressed.     Wt Readings from Last 3 Encounters:  06/25/20 194 lb (88 kg)  01/06/20 198 lb (89.8 kg)  01/01/20 186 lb 3.2 oz (84.5 kg)    BP 140/86   Pulse 70   Temp 98.1 F (36.7 C) (Oral)   Ht 5\' 5"  (1.651 m)   Wt 194 lb (88 kg)   SpO2 97%   BMI 32.28 kg/m   Assessment and Plan:  1. Acute non-recurrent maxillary sinusitis Acute.  Persistent.  Stable.  Patient's  had 72 hours of sinus discomfort.  Exam and history is consistent with an acute sinusitis of the maxillary sinuses.  We will initiate doxycycline 100 mg twice a day for 10 days.  Patient is also been instructed to use saline lavage of the nasal passages. - doxycycline (VIBRA-TABS) 100 MG tablet; Take 1 tablet (100 mg total) by mouth 2 (two) times daily.  Dispense: 20 tablet; Refill: 0

## 2020-07-02 DIAGNOSIS — I25118 Atherosclerotic heart disease of native coronary artery with other forms of angina pectoris: Secondary | ICD-10-CM | POA: Diagnosis not present

## 2020-07-02 DIAGNOSIS — I480 Paroxysmal atrial fibrillation: Secondary | ICD-10-CM | POA: Diagnosis not present

## 2020-07-02 DIAGNOSIS — I712 Thoracic aortic aneurysm, without rupture: Secondary | ICD-10-CM | POA: Diagnosis not present

## 2020-07-02 DIAGNOSIS — R002 Palpitations: Secondary | ICD-10-CM | POA: Diagnosis not present

## 2020-07-07 ENCOUNTER — Other Ambulatory Visit
Admission: RE | Admit: 2020-07-07 | Discharge: 2020-07-07 | Disposition: A | Discharge status: Home / Self Care | Payer: Medicare HMO | Source: Ambulatory Visit | Attending: Gastroenterology | Admitting: Gastroenterology

## 2020-07-07 DIAGNOSIS — K760 Fatty (change of) liver, not elsewhere classified: Secondary | ICD-10-CM | POA: Insufficient documentation

## 2020-07-07 DIAGNOSIS — K7460 Unspecified cirrhosis of liver: Secondary | ICD-10-CM | POA: Diagnosis not present

## 2020-07-07 DIAGNOSIS — R197 Diarrhea, unspecified: Secondary | ICD-10-CM | POA: Diagnosis not present

## 2020-07-07 DIAGNOSIS — K8681 Exocrine pancreatic insufficiency: Secondary | ICD-10-CM | POA: Diagnosis not present

## 2020-07-07 LAB — AMMONIA: Ammonia: 35 umol/L (ref 9–35)

## 2020-07-09 DIAGNOSIS — K8681 Exocrine pancreatic insufficiency: Secondary | ICD-10-CM | POA: Diagnosis not present

## 2020-07-09 DIAGNOSIS — R760 Raised antibody titer: Secondary | ICD-10-CM | POA: Diagnosis not present

## 2020-07-09 DIAGNOSIS — K7460 Unspecified cirrhosis of liver: Secondary | ICD-10-CM | POA: Diagnosis not present

## 2020-07-09 DIAGNOSIS — R110 Nausea: Secondary | ICD-10-CM | POA: Diagnosis not present

## 2020-07-09 DIAGNOSIS — R197 Diarrhea, unspecified: Secondary | ICD-10-CM | POA: Diagnosis not present

## 2020-07-13 ENCOUNTER — Other Ambulatory Visit: Payer: Self-pay

## 2020-07-13 ENCOUNTER — Ambulatory Visit: Payer: Medicare HMO | Admitting: Dermatology

## 2020-07-13 DIAGNOSIS — L570 Actinic keratosis: Secondary | ICD-10-CM | POA: Diagnosis not present

## 2020-07-13 DIAGNOSIS — L821 Other seborrheic keratosis: Secondary | ICD-10-CM

## 2020-07-13 DIAGNOSIS — L578 Other skin changes due to chronic exposure to nonionizing radiation: Secondary | ICD-10-CM

## 2020-07-13 DIAGNOSIS — D1800 Hemangioma unspecified site: Secondary | ICD-10-CM

## 2020-07-13 NOTE — Patient Instructions (Signed)

## 2020-07-13 NOTE — Progress Notes (Signed)
   Follow-Up Visit   Subjective  Kaitlyn Oliver is a 87 y.o. female who presents for the following: Actinic Keratosis (Right nasal tip, right nasal bridge, left upper lip treated with LN2).  The following portions of the chart were reviewed this encounter and updated as appropriate:   Tobacco  Allergies  Meds  Problems  Med Hx  Surg Hx  Fam Hx     Review of Systems:  No other skin or systemic complaints except as noted in HPI or Assessment and Plan.  Objective  Well appearing patient in no apparent distress; mood and affect are within normal limits.  A focused examination was performed including face, right hand. Relevant physical exam findings are noted in the Assessment and Plan.  Objective  Left nasal tip: Erythematous thin papules/macules with gritty scale.   Objective  Right Mid Palm: Well healed biopsy site   Assessment & Plan    Actinic Damage - chronic, secondary to cumulative UV radiation exposure/sun exposure over time - diffuse scaly erythematous macules with underlying dyspigmentation - Recommend daily broad spectrum sunscreen SPF 30+ to sun-exposed areas, reapply every 2 hours as needed.  - Recommend staying in the shade or wearing long sleeves, sun glasses (UVA+UVB protection) and wide brim hats (4-inch brim around the entire circumference of the hat). - Call for new or changing lesions.  Seborrheic Keratoses - Stuck-on, waxy, tan-brown papules and/or plaques  - Benign-appearing - Discussed benign etiology and prognosis. - Observe - Call for any changes.  AK (actinic keratosis) Left nasal tip  Destruction of lesion - Left nasal tip Complexity: simple   Destruction method: cryotherapy   Informed consent: discussed and consent obtained   Timeout:  patient name, date of birth, surgical site, and procedure verified Lesion destroyed using liquid nitrogen: Yes   Region frozen until ice ball extended beyond lesion: Yes   Outcome: patient tolerated  procedure well with no complications   Post-procedure details: wound care instructions given    Glomangioma Right Mid Palm Biopsy proven glomangioma - Resolved  Return in about 1 year (around 07/13/2021).  I, Ashok Cordia, CMA, am acting as scribe for Sarina Ser, MD .  Documentation: I have reviewed the above documentation for accuracy and completeness, and I agree with the above.  Sarina Ser, MD

## 2020-07-16 ENCOUNTER — Encounter: Payer: Self-pay | Admitting: Dermatology

## 2020-07-20 ENCOUNTER — Other Ambulatory Visit: Payer: Self-pay | Admitting: Family Medicine

## 2020-07-20 DIAGNOSIS — K219 Gastro-esophageal reflux disease without esophagitis: Secondary | ICD-10-CM

## 2020-07-20 NOTE — Telephone Encounter (Signed)
Requested medications are due for refill today.  yes  Requested medications are on the active medications list.  yes  Last refill. 07/04/2019  Future visit scheduled.   no  Notes to clinic.  Prescription is expired.

## 2020-07-27 ENCOUNTER — Other Ambulatory Visit: Payer: Self-pay

## 2020-07-27 ENCOUNTER — Emergency Department: Payer: Medicare HMO

## 2020-07-27 ENCOUNTER — Observation Stay
Admission: EM | Admit: 2020-07-27 | Discharge: 2020-07-28 | Disposition: A | Discharge status: Home / Self Care | Payer: Medicare HMO | Attending: Internal Medicine | Admitting: Internal Medicine

## 2020-07-27 DIAGNOSIS — K5790 Diverticulosis of intestine, part unspecified, without perforation or abscess without bleeding: Secondary | ICD-10-CM | POA: Diagnosis not present

## 2020-07-27 DIAGNOSIS — K7460 Unspecified cirrhosis of liver: Secondary | ICD-10-CM | POA: Insufficient documentation

## 2020-07-27 DIAGNOSIS — Z96653 Presence of artificial knee joint, bilateral: Secondary | ICD-10-CM | POA: Insufficient documentation

## 2020-07-27 DIAGNOSIS — E039 Hypothyroidism, unspecified: Secondary | ICD-10-CM | POA: Diagnosis not present

## 2020-07-27 DIAGNOSIS — R519 Headache, unspecified: Secondary | ICD-10-CM | POA: Diagnosis present

## 2020-07-27 DIAGNOSIS — R778 Other specified abnormalities of plasma proteins: Secondary | ICD-10-CM | POA: Diagnosis not present

## 2020-07-27 DIAGNOSIS — G44209 Tension-type headache, unspecified, not intractable: Secondary | ICD-10-CM | POA: Diagnosis not present

## 2020-07-27 DIAGNOSIS — R7989 Other specified abnormal findings of blood chemistry: Secondary | ICD-10-CM | POA: Diagnosis present

## 2020-07-27 DIAGNOSIS — R110 Nausea: Secondary | ICD-10-CM

## 2020-07-27 DIAGNOSIS — Z853 Personal history of malignant neoplasm of breast: Secondary | ICD-10-CM | POA: Insufficient documentation

## 2020-07-27 DIAGNOSIS — Z87891 Personal history of nicotine dependence: Secondary | ICD-10-CM | POA: Insufficient documentation

## 2020-07-27 DIAGNOSIS — Z79899 Other long term (current) drug therapy: Secondary | ICD-10-CM | POA: Insufficient documentation

## 2020-07-27 DIAGNOSIS — D320 Benign neoplasm of cerebral meninges: Secondary | ICD-10-CM | POA: Diagnosis not present

## 2020-07-27 DIAGNOSIS — D329 Benign neoplasm of meninges, unspecified: Secondary | ICD-10-CM

## 2020-07-27 DIAGNOSIS — I480 Paroxysmal atrial fibrillation: Secondary | ICD-10-CM | POA: Diagnosis present

## 2020-07-27 DIAGNOSIS — I10 Essential (primary) hypertension: Secondary | ICD-10-CM | POA: Diagnosis not present

## 2020-07-27 DIAGNOSIS — E876 Hypokalemia: Principal | ICD-10-CM | POA: Diagnosis present

## 2020-07-27 DIAGNOSIS — R42 Dizziness and giddiness: Secondary | ICD-10-CM | POA: Diagnosis not present

## 2020-07-27 DIAGNOSIS — K219 Gastro-esophageal reflux disease without esophagitis: Secondary | ICD-10-CM

## 2020-07-27 DIAGNOSIS — I6782 Cerebral ischemia: Secondary | ICD-10-CM | POA: Diagnosis not present

## 2020-07-27 DIAGNOSIS — G4489 Other headache syndrome: Secondary | ICD-10-CM | POA: Diagnosis not present

## 2020-07-27 DIAGNOSIS — Z20822 Contact with and (suspected) exposure to covid-19: Secondary | ICD-10-CM | POA: Insufficient documentation

## 2020-07-27 DIAGNOSIS — R001 Bradycardia, unspecified: Secondary | ICD-10-CM | POA: Diagnosis not present

## 2020-07-27 DIAGNOSIS — I700 Atherosclerosis of aorta: Secondary | ICD-10-CM | POA: Diagnosis not present

## 2020-07-27 DIAGNOSIS — Z743 Need for continuous supervision: Secondary | ICD-10-CM | POA: Diagnosis not present

## 2020-07-27 DIAGNOSIS — K7689 Other specified diseases of liver: Secondary | ICD-10-CM | POA: Diagnosis not present

## 2020-07-27 LAB — COMPREHENSIVE METABOLIC PANEL
ALT: 30 U/L (ref 0–44)
AST: 36 U/L (ref 15–41)
Albumin: 3.9 g/dL (ref 3.5–5.0)
Alkaline Phosphatase: 59 U/L (ref 38–126)
Anion gap: 10 (ref 5–15)
BUN: 14 mg/dL (ref 8–23)
CO2: 26 mmol/L (ref 22–32)
Calcium: 9.5 mg/dL (ref 8.9–10.3)
Chloride: 102 mmol/L (ref 98–111)
Creatinine, Ser: 1.02 mg/dL — ABNORMAL HIGH (ref 0.44–1.00)
GFR, Estimated: 54 mL/min — ABNORMAL LOW (ref >60)
Glucose, Bld: 122 mg/dL — ABNORMAL HIGH (ref 70–99)
Potassium: 2.8 mmol/L — ABNORMAL LOW (ref 3.5–5.1)
Sodium: 138 mmol/L (ref 135–145)
Total Bilirubin: 0.8 mg/dL (ref 0.3–1.2)
Total Protein: 7.1 g/dL (ref 6.5–8.1)

## 2020-07-27 LAB — CBC WITH DIFFERENTIAL/PLATELET
Abs Immature Granulocytes: 0.02 10*3/uL (ref 0.00–0.07)
Basophils Absolute: 0 10*3/uL (ref 0.0–0.1)
Basophils Relative: 1 %
Eosinophils Absolute: 0.1 10*3/uL (ref 0.0–0.5)
Eosinophils Relative: 2 %
HCT: 34.7 % — ABNORMAL LOW (ref 36.0–46.0)
Hemoglobin: 11.8 g/dL — ABNORMAL LOW (ref 12.0–15.0)
Immature Granulocytes: 0 %
Lymphocytes Relative: 27 %
Lymphs Abs: 1.4 10*3/uL (ref 0.7–4.0)
MCH: 30.5 pg (ref 26.0–34.0)
MCHC: 34 g/dL (ref 30.0–36.0)
MCV: 89.7 fL (ref 80.0–100.0)
Monocytes Absolute: 0.4 10*3/uL (ref 0.1–1.0)
Monocytes Relative: 9 %
Neutro Abs: 3.2 10*3/uL (ref 1.7–7.7)
Neutrophils Relative %: 61 %
Platelets: 143 10*3/uL — ABNORMAL LOW (ref 150–400)
RBC: 3.87 MIL/uL (ref 3.87–5.11)
RDW: 15.1 % (ref 11.5–15.5)
WBC: 5.2 10*3/uL (ref 4.0–10.5)
nRBC: 0 % (ref 0.0–0.2)

## 2020-07-27 LAB — LIPASE, BLOOD: Lipase: 45 U/L (ref 11–51)

## 2020-07-27 LAB — TROPONIN I (HIGH SENSITIVITY): Troponin I (High Sensitivity): 60 ng/L — ABNORMAL HIGH (ref <18)

## 2020-07-27 MED ORDER — POTASSIUM CHLORIDE 10 MEQ/100ML IV SOLN
10.0000 meq | INTRAVENOUS | Status: AC
  Administered 2020-07-28: 10 meq via INTRAVENOUS
  Filled 2020-07-27: qty 100

## 2020-07-27 MED ORDER — ACETAMINOPHEN 650 MG RE SUPP: 650.0000 mg | Freq: Four times a day (QID) | RECTAL | Status: DC | PRN

## 2020-07-27 MED ORDER — POTASSIUM CHLORIDE CRYS ER 20 MEQ PO TBCR
40.0000 meq | EXTENDED_RELEASE_TABLET | Freq: Once | ORAL | Status: AC
  Administered 2020-07-28: 40 meq via ORAL
  Filled 2020-07-27: qty 2

## 2020-07-27 MED ORDER — ACETAMINOPHEN 325 MG PO TABS: 650.0000 mg | ORAL_TABLET | Freq: Four times a day (QID) | ORAL | Status: DC | PRN

## 2020-07-27 NOTE — Telephone Encounter (Signed)
Will need to be sent to [facility]/ GI

## 2020-07-27 NOTE — Progress Notes (Signed)
Brief note regarding plan, with full H&P to follow:  87 year old with history of atrial fibrillation not on chronic anticoagulation, who is being admitted for hypokalemia after presenting with episode dizziness associated with nausea occurring earlier today.  Episode was self-limited, and the patient denies any associated acute focal neurologic deficits.  She notes a history of Bell's palsy, with right-sided facial droop involving V1 through V3 that is chronic for her.   And rate controlled atrial fibrillation, with mildly elevated initial troponin of 60, which appears consistent with her previous troponin values, but will trend out further and monitor on telemetry.  Not associate with any chest pain.  Given that the patient is not on chronic anticoagulation for her paroxysmal atrial fibrillation increasing her risk for embolic stroke, she underwent CT of the head this evening, which showed no evidence of acute process.  Subsequently, EDP is ordered MRI brain, with result currently pending.  Refrain from aggressive antihypertensive intervention pending the results of this MRI brain.  Additionally, she is receiving both IV and oral supplementation of her presenting hypokalemia.  Serum magnesium level pending.    Babs Bertin, DO Hospitalist

## 2020-07-27 NOTE — Telephone Encounter (Signed)
Copied from [id number]. Topic: Quick Communication - Rx Refill/Question >> Jul 27, 2020  8:23 AM Kaitlyn Oliver wrote: Medication: omeprazole (PRILOSEC) 20 MG capsule  Has the patient contacted their pharmacy? Yes.   (Agent: If no, request that the patient contact the pharmacy for the refill.) (Agent: If yes, when and what did the pharmacy advise?)  Preferred Pharmacy (with phone number or street name): [place], [place] - [place]  Phone:  [phone] Fax:  [phone]     Agent: Please be advised that RX refills may take up to 3 business days. We ask that you follow-up with your pharmacy.

## 2020-07-27 NOTE — ED Triage Notes (Signed)
Pt presents to ER c/o sudden onset dizziness that started around 2015.  Pt reports she was sittiing in her recliner and suddenly became dizzy.  Pt states she thinks she had vertigo in her 46s but nothing since then.  Pt is A&O x4 at this time, and is hypertensive with systolic BP >734 w/ems.  Pt has hx of htn.

## 2020-07-27 NOTE — Telephone Encounter (Signed)
Patient called and advised of the refusal to refill on 07/20/20, as noted to contact [facility] to refill. She says Dr. Ronnald Ramp has been refilling this for her because she's been on it for years. I advised I will route back to Dr. Ronnald Ramp for refill. Last refilled by Dr. Ronnald Ramp 07/04/19 #90/1 refill.

## 2020-07-27 NOTE — H&P (Signed)
History and Physical    PLEASE NOTE THAT DRAGON DI[employer] SOFTWARE WAS USED IN THE CONSTRUCTION OF THIS NOTE.   Kaitlyn Oliver [id number] DOB: Jun 29, 1933 DOA: 07/27/2020  PCP: Juline Patch, MD Patient coming from: home   I have personally briefly reviewed patient's old medical records in [place]  Chief Complaint: Headache  HPI: Kaitlyn Oliver is a 87 y.o. 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   The patient reporting development of an acute headache bilateral frontal lobes at approximately 1815.  She reports that she was at rest at the time of development of this headache, which she described as pressure across her forehead in symmetrical distribution.  She conveys that this headache was associated with dizziness as well as nausea in the absence of any vomiting.  She describes the associated dizziness unsteadiness as opposed to representing lightheadedness, presyncope, syncope, or vertigo.  Denies any associated acute focal weakness, acute focal numbness, paresthesias, change in her chronic facial droop, dysarthria, acute change in vision, dysphagia, or acute change in hearing.  Headache was not associate with any visual or olfactory aura.   She also denies any associated chest pain, shortness of breath, palpitations.  No recent orthopnea, PND, or new onset peripheral edema.  She also denies any recent cough, wheezing, hemoptysis, new lower extremity erythema, or calf tenderness.  Denies any recent melena or hematochezia.  No recent subjective fever, chills, rigors, or generalized myalgias.  Not  associate with any headache or rash.  She also denies any known recent COVID-19 exposures.  Denies any acute urinary symptoms, including no recent dysuria, gross hematuria, or change in urinary urgency/frequency.  She reports the sensation of mild abdominal bloating over the last 1 to 2 days, but denies overtly associated pain with this.  No recent traveling or diarrhea.  In the setting of her acute headache associated with dizziness and nausea, the patient was able to contact EMS, who brought her to [facility] ED for further evaluation.  She received Zofran 4 mg ODT x1 in route to the hospital, which she notes was associated with complete resolution of her nausea, without any subsequent return.  She notes that approximately 1 hour after the onset of the symptoms, she experienced complete and spontaneous resolution of her headache as well as dizziness, and denies any subsequent return or residual nature of these symptoms.  Of note, patient has a history of paroxysmal atrial fibrillation prompting hospitalization at [facility] in October 2021 for atrial fibrillation with RVR, during which her high-sensitivity troponin I range was noted to be 60-1 05, with final value of 95.  Her atrial fibrillation is associated with CHA2DS2-VASc of 5 and she follows with Dr. Saralyn Pilar of [facility] cardiology.  The patient reports that following her discussions with Dr.[name] regarding the indications versus risks versus benefits versus alternatives for initiation of chronic anticoagulation in the setting of paroxysmal atrial fibrillation, that she elected to refuse chronic anticoagulation.  Initially, she reports that she is on no antiplatelet medications at home.  Most recent echocardiogram occurred in October 2021 during her hospitalization for atrial fibrillation with RVR, it was notable for normal left ventricular cavity size, LVEF 60 to 65%, no evidence of focal  wall motion of bodies, normal diastolic function, and trivial mitral  regurgitation.  She confirms that she is on no AV nodal blocking agents at home.    ED Course:  Vital signs in the ED were notable for the following: Temperature max 97.9, heart rate 56-1 57, initial blood pressure 209/85, subsequently trending down to 173/91 without pharmacologic intervention, respiratory rate 14-17; oxygen saturation 94 to 97% on room air.  Labs were notable for the following: CMP notable for sodium 138, potassium 2.8, bicarbonate 26, creatinine 1.02 relative to most recent prior creatinine data point of 0.91 on 05/20/2020, and liver enzymes were found to be within normal limits.  High-sensitivity troponin I initially noted to be 60, with repeat value trending up slightly to 61.  Nasopharyngeal COVID-19/influenza PCR performed in the emergency department today and found to be negative.  EKG showed sinus bradycardia with heart rate 54, normal intervals, and no evidence of T wave or ST changes.  Noncontrast CT of the head showed no evidence of acute intra cranial abnormality, including no evidence of intracranial hemorrhage or acute infarct, while showing a 12 mm meningioma over the superior left convexity without associated mass affect.  The setting of the patient's recent abdominal bloating, EDP ordered CT abdomen/pelvis which showed no evidence of acute intra-abdominal process.  MRI brain showed no evidence of acute process, including no evidence of acute infarct.  While in the ED, the following were administered: Potassium chloride 40 mEq p.o. x1 as well as potassium chloride 20 mEq IV over 2 hours x 1.  Subsequently, the patient was admitted for overnight observation for further evaluation and management of hypokalemia as well as further evaluation of mildly elevated presenting troponin.      Review of Systems: As per HPI otherwise 10 point review of systems negative.   Past Medical History:  Diagnosis Date  . Anxiety   . Arthritis    thumbs, hands  . Cancer [facility]) 1991    bilateral breast  . Depression   . GERD (gastroesophageal reflux disease)   . Headache    occasional - pinched nerve in neck  . Hypertension   . Hypothyroidism   . Thyroid disease   . Wears dentures    full upper, partial lower    Past Surgical History:  Procedure Laterality Date  . BOWEL RESECTION  01/17/2018   Procedure: SMALL BOWEL RESECTION;  Surgeon: Jules Husbands, MD;  Location: [facility] ORS;  Service: General;;  . BREAST SURGERY Bilateral 1991   mastectomy  . CATARACT EXTRACTION Bilateral   . CHOLECYSTECTOMY    . COLONOSCOPY  2013   Dr Gustavo Lah  . HALLUX VALGUS AUSTIN Right 06/03/2015   Procedure: HALLUX VALGUS AUSTIN;  Surgeon: Samara Deist, DPM;  Location: [place];  Service: Podiatry;  Laterality: Right;  WITH POPLITEAL  . KNEE ARTHROSCOPY WITH MEDIAL MENISECTOMY Right 08/30/2016   Procedure: KNEE ARTHROSCOPY WITH PARTIAL MEDIAL MENISECTOMY and Partial Lateral Menisectomy;  Surgeon: Hessie Knows, MD;  Location: [facility] ORS;  Service: Orthopedics;  Laterality: Right;  . KNEE ARTHROSCOPY WITH SUBCHONDROPLASTY Right 08/30/2016   Procedure: KNEE ARTHROSCOPY WITH SUBCHONDROPLASTY;  Surgeon: Hessie Knows, MD;  Location: [facility] ORS;  Service: Orthopedics;  Laterality: Right;  . LAPAROTOMY N/A 01/17/2018   Procedure: EXPLORATORY LAPAROTOMY;  Surgeon: Jules Husbands, MD;  Location: [facility] ORS;  Service: General;  Laterality: N/A;  . MASTECTOMY Bilateral   . ROTATOR CUFF REPAIR Bilateral   . TONSILLECTOMY    . TOTAL KNEE ARTHROPLASTY Left   .  TOTAL KNEE ARTHROPLASTY Right 05/30/2017   Procedure: TOTAL KNEE ARTHROPLASTY;  Surgeon: Hessie Knows, MD;  Location: [facility] ORS;  Service: Orthopedics;  Laterality: Right;  Marland Kitchen VAGINAL HYSTERECTOMY      Social History:  reports that she quit smoking about 31 years ago. Her smoking use included cigarettes. She has a 2.50 pack-year smoking history. She has never used smokeless tobacco. She reports that she does not drink alcohol and does not  use drugs.   Allergies  Allergen Reactions  . Adhesive [Tape] Dermatitis    Can not use plastic tape. Paper tape ok   . Amlodipine Other (See Comments)    Trouble swallowing  . Asa [Aspirin]     Itchy   . Penicillin G Itching    Has patient had a PCN reaction causing immediate rash, facial/tongue/throat swelling, SOB or lightheadedness with hypotension: No Has patient had a PCN reaction causing severe rash involving mucus membranes or skin necrosis: No Has patient had a PCN reaction that required hospitalization: No Has patient had a PCN reaction occurring within the last 10 years: No If all of the above answers are "NO", then may proceed with Cephalosporin use.   Marland Kitchen Hydrocodone-Acetaminophen Itching and Rash    Family History  Problem Relation Age of Onset  . Pulmonary embolism Mother   . Healthy Father     Family history reviewed and not pertinent    Prior to Admission medications   Medication Sig Start Date End Date Taking? Authorizing Provider  amLODipine (NORVASC) 5 MG tablet Take 10 mg by mouth daily. 04/28/20   [provider]  doxycycline (VIBRA-TABS) 100 MG tablet Take 1 tablet (100 mg total) by mouth 2 (two) times daily. 06/25/20   Juline Patch, MD  EUTHYROX 50 MCG tablet Take 50 mcg by mouth daily. 10/29/19   [provider]  hydrochlorothiazide (HYDRODIURIL) 12.5 MG tablet Take 12.5 mg by mouth daily. 06/04/20   [provider]  omeprazole (PRILOSEC) 20 MG capsule Take 1 capsule (20 mg total) by mouth daily. 07/04/19   Juline Patch, MD  potassium chloride (KLOR-CON) 10 MEQ tablet Take by mouth. 06/11/20 06/11/21  [provider]  valsartan (DIOVAN) 40 MG tablet Take 40 mg by mouth daily. 07/17/20   [provider]     Objective    Physical Exam: Vitals:   07/27/20 2124 07/27/20 2125 07/27/20 2200 07/27/20 2230  BP: (!) 209/85  (!) 179/97 (!) 173/91  Pulse: (!) 57  (!) 56 (!) 56  Resp: 17  12 10   Temp: 97.9 F  (36.6 C)     TempSrc: Oral     SpO2: 95%  94% 97%  Weight:  87.5 kg    Height:  5\' 5"  (1.651 m)      General: appears to be stated age; alert, oriented Skin: warm, dry, no rash Head:  AT/[place] Mouth:  Oral mucosa membranes appear dry, normal dentition Neck: supple; trachea midline Heart:  RRR; did not appreciate any M/R/G Lungs: CTAB, did not appreciate any wheezes, rales, or rhonchi Abdomen: + BS; soft, ND, NT Vascular: 2+ pedal pulses b/l; 2+ radial pulses b/l Extremities: no peripheral edema, no muscle wasting Neuro: right facial droop (patient reports chronic dating back to childhood); strength and sensation intact in upper and lower extremities b/l     Labs on Admission: I have personally reviewed following labs and imaging studies  CBC: Recent Labs  Lab 07/27/20 2203  WBC 5.2  NEUTROABS 3.2  HGB 11.8*  HCT 34.7*  MCV 89.7  PLT 093*   Basic Metabolic Panel: Recent Labs  Lab 07/27/20 2203  NA 138  K 2.8*  CL 102  CO2 26  GLUCOSE 122*  BUN 14  CREATININE 1.02*  CALCIUM 9.5   GFR: Estimated Creatinine Clearance: 44.9 mL/min (A) (by C-G formula based on SCr of 1.02 mg/dL (H)). Liver Function Tests: Recent Labs  Lab 07/27/20 2203  AST 36  ALT 30  ALKPHOS 59  BILITOT 0.8  PROT 7.1  ALBUMIN 3.9   Recent Labs  Lab 07/27/20 2203  LIPASE 45   No results for input(s): AMMONIA in the last 168 hours. Coagulation Profile: No results for input(s): INR, PROTIME in the last 168 hours. Cardiac Enzymes: No results for input(s): CKTOTAL, CKMB, CKMBINDEX, TROPONINI in the last 168 hours. BNP (last 3 results) No results for input(s): PROBNP in the last 8760 hours. HbA1C: No results for input(s): HGBA1C in the last 72 hours. CBG: No results for input(s): GLUCAP in the last 168 hours. Lipid Profile: No results for input(s): CHOL, HDL, LDLCALC, TRIG, CHOLHDL, LDLDIRECT in the last 72 hours. Thyroid Function Tests: No results for input(s): TSH, T4TOTAL, FREET4,  T3FREE, THYROIDAB in the last 72 hours. Anemia Panel: No results for input(s): VITAMINB12, FOLATE, FERRITIN, TIBC, IRON, RETICCTPCT in the last 72 hours. Urine analysis:    Component Value Date/Time   COLORURINE COLORLESS (A) 01/01/2020 0622   APPEARANCEUR CLEAR (A) 01/01/2020 0622   LABSPEC 1.006 01/01/2020 0622   PHURINE 8.0 01/01/2020 0622   GLUCOSEU NEGATIVE 01/01/2020 0622   HGBUR NEGATIVE 01/01/2020 0622   BILIRUBINUR NEGATIVE 01/01/2020 0622   KETONESUR 5 (A) 01/01/2020 0622   PROTEINUR 100 (A) 01/01/2020 0622   NITRITE NEGATIVE 01/01/2020 0622   LEUKOCYTESUR NEGATIVE 01/01/2020 0622    Radiological Exams on Admission: CT ABDOMEN PELVIS WO CONTRAST  Result Date: 07/27/2020 CLINICAL DATA:  Abdominal distension EXAM: CT ABDOMEN AND PELVIS WITHOUT CONTRAST TECHNIQUE: Multidetector CT imaging of the abdomen and pelvis was performed following the standard protocol without IV contrast. COMPARISON:  None. FINDINGS: LOWER CHEST: Normal. HEPATOBILIARY: Diffusely nodular hepatic contours with relative hypertrophy of the caudate and left hepatic lobe, consistent with hepatic cirrhosis. No focal liver lesion. No biliary dilatation. Status post cholecystectomy. PANCREAS: Normal pancreas. No ductal dilatation or peripancreatic fluid collection. SPLEEN: Normal. ADRENALS/URINARY TRACT: The adrenal glands are normal. No hydronephrosis, nephroureterolithiasis or solid renal mass. The urinary bladder is normal for degree of distention STOMACH/BOWEL: There is no hiatal hernia. Normal duodenal course and caliber. Postsurgical changes in the right lower quadrant again noted. Rectosigmoid diverticulosis without acute inflammation. The appendix is not visualized. No right lower quadrant inflammation or free fluid. VASCULAR/LYMPHATIC: There is calcific atherosclerosis of the abdominal aorta. No lymphadenopathy. REPRODUCTIVE: Status post hysterectomy. No adnexal mass. MUSCULOSKELETAL. Multilevel degenerative disc  disease and facet arthrosis. No bony spinal canal stenosis. OTHER: None. IMPRESSION: 1. No acute abnormality of the abdomen or pelvis. 2. Hepatic cirrhosis. Aortic Atherosclerosis (ICD10-I70.0). Electronically Signed   By: Ulyses Jarred M.D.   On: 07/27/2020 22:37   CT Head Wo Contrast  Result Date: 07/27/2020 CLINICAL DATA:  Dizziness EXAM: CT HEAD WITHOUT CONTRAST TECHNIQUE: Contiguous axial images were obtained from the base of the skull through the vertex without intravenous contrast. COMPARISON:  None. FINDINGS: Brain: There is no mass, hemorrhage or extra-axial collection. The size and configuration of the ventricles and extra-axial CSF spaces are normal. There is hypoattenuation of the white matter, most commonly indicating chronic small  vessel disease. 12 mm meningioma over the superior left convexity (coronal image 42). No mass effect on the underlying brain. Vascular: No abnormal hyperdensity of the major intracranial arteries or dural venous sinuses. No intracranial atherosclerosis. Skull: The visualized skull base, calvarium and extracranial soft tissues are normal. Sinuses/Orbits: No fluid levels or advanced mucosal thickening of the visualized paranasal sinuses. No mastoid or middle ear effusion. The orbits are normal. IMPRESSION: 1. No acute intracranial abnormality. 2. 12 mm meningioma over the superior left convexity without mass effect on the underlying brain. 3. Chronic small vessel disease. Electronically Signed   By: Ulyses Jarred M.D.   On: 07/27/2020 22:33     EKG: Independently reviewed, with result as described above.    Assessment/Plan   Kaitlyn Oliver is a 87 y.o. 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    Principal Problem:   Hypokalemia Active Problems:   Essential (primary) hypertension   Hypothyroidism   GERD (gastroesophageal reflux disease)   Elevated troponin   Hypomagnesemia   Headache   AF (paroxysmal atrial fibrillation) (HCC)    #) Hypokalemia: Presenting serum potassium found to be 2.8, and confounded by hypomagnesemia, as further detailed below.  Given the patient's report of presenting dizziness in the setting of elevated troponin, will strive to optimize these electrolytes to reduce risk of arrhythmia.  Of note, it appears that the patient is on chronic daily oral potassium supplementation at home, taking 10 mEq p.o. daily.  She is status post 40 mill equivalents of oral potassium administered in the ED as well as potassium chloride 20 mEq IV over 2 hours in the ED as well.  Plan: Repeat BMP.  Monitor on telemetry.  Work-up and management of hypomagnesemia, as further described below.  Resume home daily oral potassium supplementation.      #) Elevated troponin: Presenting high-sensitivity troponin I found to be mildly elevated at 60, with slight interval increase to 61 upon repeat.  This is relative to the only prior series of data points available in our EMR for point comparison, which occurred at the time of her hospitalization in October 2021 for atrial fibrillation with RVR during which she demonstrated elevation in her troponin and a range of 68-1 05, with most recent prior value of 95 on 01/02/2020.  Consequently, the actual chronicity for troponin elevation is not entirely clear, and unable to decipher at this time whether her presenting troponin of 60 is on par with a chronic elevation versus representing an acute increase after an unknown monitor dissent and her previously elevated troponin back to a nonelevated levels.  ACS is felt to be less likely in the  absence of any recent chest pain and with presenting EKG showing no evidence of acute ischemic changes, although we will continue to trend serial troponin to further rule out ACS given that the patient did experience dizziness and nausea earlier today.  Of note, presentation is much less suggestive of acute pulmonary embolism.  In general, will continue to trend serial troponin values while monitoring on telemetry, with echocardiogram ordered for the morning to evaluate for any interval focal wall motion normalities.  Refraining from aspirin given the patient's report of allergy to this  medication.  Plan: Continue to trend serial troponin values.  Monitor on telemetry.  Echocardiogram has been ordered for the morning, as above.  Continue home valsartan.  Supplementation of hypokalemia and hypomagnesemia, with goal to maintain values of greater than or equal to 4.0 and 2.0, respectively.  Repeat BMP in the morning.  Repeat CBC in the morning.  Chest x-ray.      Hypomagnesemia: Serum magnesium level found to be 1.6.  Plan: Magnesium sulfate 2 g IV over 2 hours x 1 now.  Monitor on symmetry.  Repeat serum magnesium level in the morning.        #) Paroxysmal atrial fibrillation: Documented history of such. In the setting of a CHA2DS2-VASc score of 5, there is an indication for the patient to be on chronic anticoagulation for thromboembolic prophylaxis. However, the patient is not currently on chronic anticoagulation or any antiplatelet medications at home . She  reports that following her discussions with Dr.[name], as her outpatient cardiologist,  regarding the indications versus risks versus benefits versus alternatives for initiation of chronic anticoagulation in the setting of paroxysmal atrial fibrillation, that she elected to refuse chronic anticoagulation.  Not on any AV nodal blocking agents as an outpatient.  Presenting EKG shows sinus bradycardia with heart rate 54 and no evidence of acute  ischemic changes.  Most recent echocardiogram was performed in October 2021, with results as further detailed above.   Plan: monitor strict I's & O's and daily weights. Repeat BMP and CBC in the morning.  Supplementation of presenting hypokalemic and hypomagnesemic findings, as above.  Monitor on telemetry.       #) Essential hypertension: Outpatient antihypertensive regimen includes valsartan, Norvasc, and HCTZ.  After initial blood pressure noted to be 209/85, resolution of patient's symptoms were associated with spontaneous decrease in her blood pressure to 173/91.  In the context of presenting elevated troponin, will hold home Norvasc for now given relative contraindication to calcium channel blockers in the setting of potential NSTEMI.  In the setting, will resume valsartan.  Additionally, the patient appears mildly dry, consequently, will hold home HCTZ for now with close monitoring of ensuing blood pressure.  Plan: Resume home valsartan.  Hold home Norvasc and HCTZ for now, while closely monitoring ensuing blood pressure via routine vital signs.  Monitor strict I's and O's and daily weights.  Repeat BMP in the morning.       #) Headache: 1 self-limited headache in symmetrical frontal lobe distribution associated with dizziness and nausea in the absence of any acute focal neurologic deficits nor any associated aura.  No residual headache at this time.  Clinically, description of patient's headache appears less suggestive of migraine.  Differential includes tension headache.  No evidence of acute intracranial process on either presenting CT head or MRI brain, including no evidence of intracranial hemorrhage or acute ischemic infarct, thereby ruling out acute ischemic stroke is underlying etiology leading to headache.  TIA is a possibility, but in the absence of any associated acute focal neurologic deficits, appears less likely at this time.  Patient appears to be mildly dehydrated with dry  oral mucosa membranes, prompting gentle IV fluids to be ordered overnight.  In the setting of a history of acquired hypothyroidism on thyroid supplementation at home, will also check TSH.  Plan: As needed acetaminophen.  Close monitoring of ensuing blood pressure via routine vital signs.  NS at 50 cc/h x 8 hours.  Check TSH, as above.       #)  Acquired hypothyroidism: On Euthyrox sent as an outpatient.  Given the nonspecific nature of the patient's presenting complaints, will check TSH.  Plan: Continue current home dose of thyroid supplementation, while checking TSH.      #) GERD: On omeprazole as an outpatient.  Plan: Continue home PPI.      DVT prophylaxis: SCDs Code Status: Full code Family Communication: none Disposition Plan: Per Rounding Team Consults called: none  Admission status: Observation;     Of note, this patient was added by me to the following Admit List/Treatment Team: armcadmits.      PLEASE NOTE THAT DRAGON DICTATION SOFTWARE WAS USED IN THE CONSTRUCTION OF THIS NOTE.   [facility] Pager [phone] From [place]  Otherwise, please contact night-coverage  [email or web address] Password [facility]   07/27/2020, 11:54 PM

## 2020-07-27 NOTE — ED Provider Notes (Signed)
[facility] Emergency Department Provider Note  ____________________________________________   Event Date/Time   First MD Initiated Contact with Patient 07/27/20 2116     (approximate)  I have reviewed the triage vital signs and the nursing notes.   HISTORY  Chief Complaint Dizziness and Hypertension    HPI Kaitlyn Oliver is a 87 y.o. female with hypertension who comes in for dizziness.  Patient states around 8 PM she started to have a pressure in her head with some associated dizziness worse with moving her head and nausea and fullness in her stomach.  This was constant, better with Zofran.  Denies having this previously.  Patient does have a little bit of right-sided facial droop but states that she had Bell's palsy when she was a kid and that is always been there.  Denies any weakness in her arms or legs or any other symptoms.  Daughter noted that she was little diaphoretic as well.          Past Medical History:  Diagnosis Date  . Anxiety   . Arthritis    thumbs, hands  . Cancer [facility]) 1991   bilateral breast  . Depression   . GERD (gastroesophageal reflux disease)   . Headache    occasional - pinched nerve in neck  . Hypertension   . Hypothyroidism   . Thyroid disease   . Wears dentures    full upper, partial lower    Patient Active Problem List   Diagnosis Date Noted  . Chest pain 01/01/2020  . Atrial fibrillation, new onset ([place]) 01/01/2020  . Elevated troponin 01/01/2020  . CAD (coronary artery disease) 01/01/2020  . Hypokalemia 08/20/2018  . 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 Goals of care, counseling/discussion 01/26/2018  . Small bowel tumor   . Pancreatic cyst 01/15/2018  . Anxiety and depression 01/15/2018  . Pancreas cyst 01/15/2018  . Bowel obstruction ([place]) 11/29/2017  . Small bowel obstruction ([place])   . Thoracoabdominal aortic aneurysm (TAAA) ([place]) 11/19/2017  . Status post total knee  replacement using cement, right 05/30/2017  . GERD (gastroesophageal reflux disease) 12/22/2015  . Adenomatous polyp 09/11/2014  . Bilateral cataracts 09/11/2014  . Gastric catarrh 09/11/2014  . Gastroduodenal ulcer 09/11/2014  . Hypothyroidism 09/11/2014  . Cardiac murmur 10/08/2013  . Essential (primary) hypertension 10/08/2013  . Combined fat and carbohydrate induced hyperlipemia 10/08/2013    Past Surgical History:  Procedure Laterality Date  . BOWEL RESECTION  01/17/2018   Procedure: SMALL BOWEL RESECTION;  Surgeon: Jules Husbands, MD;  Location: [facility] ORS;  Service: General;;  . BREAST SURGERY Bilateral 1991   mastectomy  . CATARACT EXTRACTION Bilateral   . CHOLECYSTECTOMY    . COLONOSCOPY  2013   Dr Gustavo Lah  . HALLUX VALGUS AUSTIN Right 06/03/2015   Procedure: HALLUX VALGUS AUSTIN;  Surgeon: Samara Deist, DPM;  Location: [place];  Service: Podiatry;  Laterality: Right;  WITH POPLITEAL  . KNEE ARTHROSCOPY WITH MEDIAL MENISECTOMY Right 08/30/2016   Procedure: KNEE ARTHROSCOPY WITH PARTIAL MEDIAL MENISECTOMY and Partial Lateral Menisectomy;  Surgeon: Hessie Knows, MD;  Location: [facility] ORS;  Service: Orthopedics;  Laterality: Right;  . KNEE ARTHROSCOPY WITH SUBCHONDROPLASTY Right 08/30/2016   Procedure: KNEE ARTHROSCOPY WITH SUBCHONDROPLASTY;  Surgeon: Hessie Knows, MD;  Location: [facility] ORS;  Service: Orthopedics;  Laterality: Right;  . LAPAROTOMY N/A 01/17/2018   Procedure: EXPLORATORY LAPAROTOMY;  Surgeon: Jules Husbands, MD;  Location: [facility] ORS;  Service: General;  Laterality: N/A;  .  MASTECTOMY Bilateral   . ROTATOR CUFF REPAIR Bilateral   . TONSILLECTOMY    . TOTAL KNEE ARTHROPLASTY Left   . TOTAL KNEE ARTHROPLASTY Right 05/30/2017   Procedure: TOTAL KNEE ARTHROPLASTY;  Surgeon: Hessie Knows, MD;  Location: [facility] ORS;  Service: Orthopedics;  Laterality: Right;  Marland Kitchen VAGINAL HYSTERECTOMY      Prior to Admission medications   Medication Sig Start Date End Date  Taking? Authorizing Provider  amLODipine (NORVASC) 5 MG tablet Take 10 mg by mouth daily. 04/28/20   [provider]  doxycycline (VIBRA-TABS) 100 MG tablet Take 1 tablet (100 mg total) by mouth 2 (two) times daily. 06/25/20   Juline Patch, MD  EUTHYROX 50 MCG tablet Take 50 mcg by mouth daily. 10/29/19   [provider]  hydrochlorothiazide (HYDRODIURIL) 12.5 MG tablet Take 12.5 mg by mouth daily. 06/04/20   [provider]  omeprazole (PRILOSEC) 20 MG capsule Take 1 capsule (20 mg total) by mouth daily. 07/04/19   Juline Patch, MD  potassium chloride (KLOR-CON) 10 MEQ tablet Take by mouth. 06/11/20 06/11/21  [provider]  valsartan (DIOVAN) 40 MG tablet Take 40 mg by mouth daily. 07/17/20   [provider]    Allergies Adhesive [tape], Amlodipine, Asa [aspirin], Penicillin g, and Hydrocodone-acetaminophen  Family History  Problem Relation Age of Onset  . Pulmonary embolism Mother   . Healthy Father     Social History Social History   Tobacco Use  . Smoking status: Former Smoker    Packs/day: 0.25    Years: 10.00    Pack years: 2.50    Types: Cigarettes    Quit date: 1991    Years since quitting: 31.3  . Smokeless tobacco: Never Used  . Tobacco comment: smoking cessation materials not required  Vaping Use  . Vaping Use: Never used  Substance Use Topics  . Alcohol use: No    Alcohol/week: 0.0 standard drinks  . Drug use: No      Review of Systems Constitutional: No fever/chills Eyes: No visual changes. ENT: No sore throat. Cardiovascular: Denies chest pain. Respiratory: Denies shortness of breath. Gastrointestinal: Fullness in abdomen no nausea, no vomiting.  No diarrhea.  No constipation. Genitourinary: Negative for dysuria. Musculoskeletal: Negative for back pain. Skin: Negative for rash. Neurological: Dizziness, headache All other ROS negative ____________________________________________   PHYSICAL EXAM:  VITAL  SIGNS: ED Triage Vitals  Enc Vitals Group     BP 07/27/20 2124 (!) 209/85     Pulse Rate 07/27/20 2124 (!) 57     Resp 07/27/20 2124 17     Temp 07/27/20 2124 97.9 F (36.6 C)     Temp Source 07/27/20 2124 Oral     SpO2 07/27/20 2124 95 %     Weight 07/27/20 2125 193 lb (87.5 kg)     Height 07/27/20 2125 5\' 5"  (1.651 m)     Head Circumference --      Peak Flow --      Pain Score 07/27/20 2125 0     Pain Loc --      Pain Edu? --      Excl. in [place]? --     Constitutional: Alert and oriented. Well appearing and in no acute distress. Eyes: Conjunctivae are normal. EOMI. Head: Atraumatic. Nose: No congestion/rhinnorhea. Mouth/Throat: Mucous membranes are moist.   Neck: No stridor. Trachea Midline. FROM Cardiovascular: Normal rate, regular rhythm. Grossly normal heart sounds.  Good peripheral circulation. Respiratory: Normal respiratory effort.  No  retractions. Lungs CTAB. Gastrointestinal: Soft and nontender. No distention. No abdominal bruits.  Musculoskeletal: No lower extremity tenderness nor edema.  No joint effusions. Neurologic:  Normal speech and language. No gross focal neurologic deficits are appreciated.  Right-sided facial droop at baseline per patient.  Equal strength in arms and legs.  Fingers to nose intact Skin:  Skin is warm, dry and intact. No rash noted. Psychiatric: Mood and affect are normal. Speech and behavior are normal. GU: Deferred   ____________________________________________   LABS (all labs ordered are listed, but only abnormal results are displayed)  Labs Reviewed  CBC WITH DIFFERENTIAL/PLATELET - Abnormal; Notable for the following components:      Result Value   Hemoglobin 11.8 (*)    HCT 34.7 (*)    Platelets 143 (*)    All other components within normal limits  COMPREHENSIVE METABOLIC PANEL - Abnormal; Notable for the following components:   Potassium 2.8 (*)    Glucose, Bld 122 (*)    Creatinine, Ser 1.02 (*)    GFR, Estimated 54 (*)     All other components within normal limits  TROPONIN I (HIGH SENSITIVITY) - Abnormal; Notable for the following components:   Troponin I (High Sensitivity) 60 (*)    All other components within normal limits  RESP PANEL BY RT-PCR (FLU A&B, COVID) ARPGX2  LIPASE, BLOOD  MAGNESIUM  TROPONIN I (HIGH SENSITIVITY)   ____________________________________________   ED ECG REPORT I, Vanessa Victor, the attending physician, personally viewed and interpreted this ECG.  Sinus bradycardia rate of 54, no ST elevation, no T wave inversions, normal intervals ____________________________________________  RADIOLOGY  Official radiology report(s): CT ABDOMEN PELVIS WO CONTRAST  Result Date: 07/27/2020 CLINICAL DATA:  Abdominal distension EXAM: CT ABDOMEN AND PELVIS WITHOUT CONTRAST TECHNIQUE: Multidetector CT imaging of the abdomen and pelvis was performed following the standard protocol without IV contrast. COMPARISON:  None. FINDINGS: LOWER CHEST: Normal. HEPATOBILIARY: Diffusely nodular hepatic contours with relative hypertrophy of the caudate and left hepatic lobe, consistent with hepatic cirrhosis. No focal liver lesion. No biliary dilatation. Status post cholecystectomy. PANCREAS: Normal pancreas. No ductal dilatation or peripancreatic fluid collection. SPLEEN: Normal. ADRENALS/URINARY TRACT: The adrenal glands are normal. No hydronephrosis, nephroureterolithiasis or solid renal mass. The urinary bladder is normal for degree of distention STOMACH/BOWEL: There is no hiatal hernia. Normal duodenal course and caliber. Postsurgical changes in the right lower quadrant again noted. Rectosigmoid diverticulosis without acute inflammation. The appendix is not visualized. No right lower quadrant inflammation or free fluid. VASCULAR/LYMPHATIC: There is calcific atherosclerosis of the abdominal aorta. No lymphadenopathy. REPRODUCTIVE: Status post hysterectomy. No adnexal mass. MUSCULOSKELETAL. Multilevel degenerative disc  disease and facet arthrosis. No bony spinal canal stenosis. OTHER: None. IMPRESSION: 1. No acute abnormality of the abdomen or pelvis. 2. Hepatic cirrhosis. Aortic Atherosclerosis (ICD10-I70.0). Electronically Signed   By: Ulyses Jarred M.D.   On: 07/27/2020 22:37   CT Head Wo Contrast  Result Date: 07/27/2020 CLINICAL DATA:  Dizziness EXAM: CT HEAD WITHOUT CONTRAST TECHNIQUE: Contiguous axial images were obtained from the base of the skull through the vertex without intravenous contrast. COMPARISON:  None. FINDINGS: Brain: There is no mass, hemorrhage or extra-axial collection. The size and configuration of the ventricles and extra-axial CSF spaces are normal. There is hypoattenuation of the white matter, most commonly indicating chronic small vessel disease. 12 mm meningioma over the superior left convexity (coronal image 42). No mass effect on the underlying brain. Vascular: No abnormal hyperdensity of the major intracranial arteries or  dural venous sinuses. No intracranial atherosclerosis. Skull: The visualized skull base, calvarium and extracranial soft tissues are normal. Sinuses/Orbits: No fluid levels or advanced mucosal thickening of the visualized paranasal sinuses. No mastoid or middle ear effusion. The orbits are normal. IMPRESSION: 1. No acute intracranial abnormality. 2. 12 mm meningioma over the superior left convexity without mass effect on the underlying brain. 3. Chronic small vessel disease. Electronically Signed   By: Ulyses Jarred M.D.   On: 07/27/2020 22:33    ____________________________________________   PROCEDURES  Procedure(s) performed (including Critical Care):  .1-3 Lead EKG Interpretation Performed by: Vanessa [place], MD Authorized by: Vanessa [place], MD     Interpretation: abnormal     ECG rate:  50s   ECG rate assessment: bradycardic     Rhythm: sinus rhythm     Ectopy: none     Conduction: normal        ____________________________________________   INITIAL IMPRESSION / ASSESSMENT AND PLAN / ED COURSE  Kaitlyn Oliver was evaluated in Emergency Department on 07/27/2020 for the symptoms described in the history of present illness. She was evaluated in the context of the global COVID-19 pandemic, which necessitated consideration that the patient might be at risk for infection with the SARS-CoV-2 virus that causes COVID-19. Institutional protocols and algorithms that pertain to the evaluation of patients at risk for COVID-19 are in a state of rapid change based on information released by regulatory bodies including the CDC and federal and state organizations. These policies and algorithms were followed during the patient's care in the ED.    Patient presents with sudden onset dizziness and head fullness.  Could be a TIA versus posterior stroke versus cardiac event versus vertigo versus mass.  We will keep patient the cardiac monitor.  Patient also states that she is got some fullness in her abdomen will get CT abdomen to make sure no evidence of small bowel obstruction.  Patient feeling better after the Zofran given by EMS.  This time her stroke scale is 1 based upon facial droop that is baseline.  Given this I did not call a stroke code.  Patient symptoms have since resolved but her CT imaging concerning for meningioma therefore will order MRI brain with and without to further evaluate.  Patient troponin is elevated and her potassium is low.  Potentially patient might of gone into A. fib with and given her high risk for TIAs even though her symptoms are resolving I think we should admit for MRI, correction of her electrolytes and further monitoring     ____________________________________________   FINAL CLINICAL IMPRESSION(S) / ED DIAGNOSES   Final diagnoses:  Hypokalemia  Meningioma (HCC)      MEDICATIONS GIVEN DURING THIS VISIT:  Medications  potassium chloride 10 mEq in  100 mL IVPB (has no administration in time range)  potassium chloride SA (KLOR-CON) CR tablet 40 mEq (has no administration in time range)  acetaminophen (TYLENOL) tablet 650 mg (has no administration in time range)    Or  acetaminophen (TYLENOL) suppository 650 mg (has no administration in time range)     ED Discharge Orders    None       Note:  This document was prepared using Dragon voice recognition software and may include unintentional dictation errors.   Vanessa [place], MD 07/27/20 [phone]

## 2020-07-27 NOTE — Telephone Encounter (Signed)
Requested medication (s) are due for refill today: Yes  Requested medication (s) are on the active medication list: Yes  Last refill:  07/04/19 by Dr. Ronnald Ramp  Future visit scheduled: Yes  Notes to clinic:  Unable to refill per protocol, Rx expired. See notes patient says refilled by Dr. Ronnald Ramp, not GI [facility]     Requested Prescriptions  Pending Prescriptions Disp Refills   omeprazole (PRILOSEC) 20 MG capsule 90 capsule 1    Sig: Take 1 capsule (20 mg total) by mouth daily.      Gastroenterology: Proton Pump Inhibitors Passed - 07/27/2020 10:13 AM      Passed - Valid encounter within last 12 months    Recent Outpatient Visits           1 month ago Acute non-recurrent maxillary sinusitis   [facility] Juline Patch, MD   6 months ago Hospital discharge follow-up   [facility] Juline Patch, MD   10 months ago Cellulitis of right lower extremity   [facility] Juline Patch, MD   10 months ago Hypothyroidism due to acquired atrophy of thyroid   [facility] Juline Patch, MD   1 year ago Anxiety and depression   [facility] Juline Patch, MD

## 2020-07-28 ENCOUNTER — Encounter: Payer: Self-pay | Admitting: Internal Medicine

## 2020-07-28 ENCOUNTER — Observation Stay: Payer: Medicare HMO

## 2020-07-28 DIAGNOSIS — I10 Essential (primary) hypertension: Secondary | ICD-10-CM | POA: Diagnosis not present

## 2020-07-28 DIAGNOSIS — I517 Cardiomegaly: Secondary | ICD-10-CM | POA: Diagnosis not present

## 2020-07-28 DIAGNOSIS — R519 Headache, unspecified: Secondary | ICD-10-CM | POA: Diagnosis present

## 2020-07-28 DIAGNOSIS — E876 Hypokalemia: Secondary | ICD-10-CM | POA: Diagnosis not present

## 2020-07-28 DIAGNOSIS — R778 Other specified abnormalities of plasma proteins: Secondary | ICD-10-CM | POA: Diagnosis not present

## 2020-07-28 DIAGNOSIS — K7469 Other cirrhosis of liver: Secondary | ICD-10-CM | POA: Insufficient documentation

## 2020-07-28 DIAGNOSIS — R110 Nausea: Secondary | ICD-10-CM | POA: Diagnosis not present

## 2020-07-28 DIAGNOSIS — R531 Weakness: Secondary | ICD-10-CM | POA: Diagnosis not present

## 2020-07-28 DIAGNOSIS — E039 Hypothyroidism, unspecified: Secondary | ICD-10-CM | POA: Diagnosis not present

## 2020-07-28 DIAGNOSIS — I480 Paroxysmal atrial fibrillation: Secondary | ICD-10-CM | POA: Diagnosis present

## 2020-07-28 DIAGNOSIS — R001 Bradycardia, unspecified: Secondary | ICD-10-CM | POA: Diagnosis not present

## 2020-07-28 DIAGNOSIS — R231 Pallor: Secondary | ICD-10-CM | POA: Diagnosis not present

## 2020-07-28 DIAGNOSIS — R42 Dizziness and giddiness: Secondary | ICD-10-CM | POA: Diagnosis not present

## 2020-07-28 LAB — BASIC METABOLIC PANEL
Anion gap: 10 (ref 5–15)
BUN: 12 mg/dL (ref 8–23)
CO2: 25 mmol/L (ref 22–32)
Calcium: 9.1 mg/dL (ref 8.9–10.3)
Chloride: 104 mmol/L (ref 98–111)
Creatinine, Ser: 0.93 mg/dL (ref 0.44–1.00)
GFR, Estimated: >60 mL/min (ref >60)
Glucose, Bld: 104 mg/dL — ABNORMAL HIGH (ref 70–99)
Potassium: 3.4 mmol/L — ABNORMAL LOW (ref 3.5–5.1)
Sodium: 139 mmol/L (ref 135–145)

## 2020-07-28 LAB — RESP PANEL BY RT-PCR (FLU A&B, COVID) ARPGX2
Influenza A by PCR: NEGATIVE
Influenza B by PCR: NEGATIVE
SARS Coronavirus 2 by RT PCR: NEGATIVE

## 2020-07-28 LAB — CBC
HCT: 33.3 % — ABNORMAL LOW (ref 36.0–46.0)
Hemoglobin: 11.2 g/dL — ABNORMAL LOW (ref 12.0–15.0)
MCH: 30.3 pg (ref 26.0–34.0)
MCHC: 33.6 g/dL (ref 30.0–36.0)
MCV: 90 fL (ref 80.0–100.0)
Platelets: 142 10*3/uL — ABNORMAL LOW (ref 150–400)
RBC: 3.7 MIL/uL — ABNORMAL LOW (ref 3.87–5.11)
RDW: 14.9 % (ref 11.5–15.5)
WBC: 5.1 10*3/uL (ref 4.0–10.5)
nRBC: 0 % (ref 0.0–0.2)

## 2020-07-28 LAB — TSH: TSH: 4.406 u[IU]/mL (ref 0.350–4.500)

## 2020-07-28 LAB — TROPONIN I (HIGH SENSITIVITY)
Troponin I (High Sensitivity): 61 ng/L — ABNORMAL HIGH (ref <18)
Troponin I (High Sensitivity): 58 ng/L — ABNORMAL HIGH (ref <18)

## 2020-07-28 LAB — MAGNESIUM
Magnesium: 1.6 mg/dL — ABNORMAL LOW (ref 1.7–2.4)
Magnesium: 2.2 mg/dL (ref 1.7–2.4)

## 2020-07-28 MED ORDER — ONDANSETRON HCL 4 MG/2ML IJ SOLN: 4.0000 mg | Freq: Four times a day (QID) | INTRAMUSCULAR | Status: DC | PRN

## 2020-07-28 MED ORDER — PANTOPRAZOLE SODIUM 40 MG PO TBEC: 40.0000 mg | DELAYED_RELEASE_TABLET | Freq: Every day | ORAL | Status: DC

## 2020-07-28 MED ORDER — LEVOTHYROXINE SODIUM 50 MCG PO TABS
50.0000 ug | ORAL_TABLET | Freq: Every day | ORAL | Status: DC
  Administered 2020-07-28: 50 ug via ORAL
  Filled 2020-07-28: qty 1

## 2020-07-28 MED ORDER — VALSARTAN 80 MG PO TABS: 80.0000 mg | ORAL_TABLET | Freq: Every day | ORAL | 0 refills | Status: DC

## 2020-07-28 MED ORDER — IRBESARTAN 75 MG PO TABS
75.0000 mg | ORAL_TABLET | Freq: Every day | ORAL | Status: DC
  Filled 2020-07-28: qty 1

## 2020-07-28 MED ORDER — MAGNESIUM SULFATE 2 GM/50ML IV SOLN
2.0000 g | Freq: Once | INTRAVENOUS | Status: AC
  Administered 2020-07-28: 2 g via INTRAVENOUS

## 2020-07-28 MED ORDER — IRBESARTAN 150 MG PO TABS
150.0000 mg | ORAL_TABLET | Freq: Every day | ORAL | Status: DC
  Filled 2020-07-28: qty 1

## 2020-07-28 MED ORDER — POTASSIUM CHLORIDE 10 MEQ/100ML IV SOLN
10.0000 meq | Freq: Once | INTRAVENOUS | Status: AC
  Administered 2020-07-28: 10 meq via INTRAVENOUS
  Filled 2020-07-28: qty 100

## 2020-07-28 MED ORDER — POTASSIUM CHLORIDE CRYS ER 10 MEQ PO TBCR
10.0000 meq | EXTENDED_RELEASE_TABLET | Freq: Every day | ORAL | Status: DC
  Filled 2020-07-28: qty 1

## 2020-07-28 MED ORDER — GADOBUTROL 1 MMOL/ML IV SOLN
8.0000 mL | Freq: Once | INTRAVENOUS | Status: AC | PRN
  Administered 2020-07-28: 8 mL via INTRAVENOUS

## 2020-07-28 MED ORDER — SODIUM CHLORIDE 0.9 % IV SOLN: INTRAVENOUS | Status: DC

## 2020-07-28 MED ORDER — POTASSIUM CHLORIDE CRYS ER 20 MEQ PO TBCR: 40.0000 meq | EXTENDED_RELEASE_TABLET | Freq: Every day | ORAL | Status: DC

## 2020-07-28 NOTE — Progress Notes (Signed)
PT Screen Note  Patient Details Name: Kaitlyn Oliver MRN: [id number] DOB: 30-Oct-1933   Cancelled Treatment:    Reason Eval/Treat Not Completed: PT screened, no needs identified, will sign off Pt with no residual symptoms and reports feeling back to baseline and ready go go.  She had good strength, was independent with mobility and easily rose to standing and walking into the hallway without AD.  Pt's husband present and corroborates near baseline status.  No needs, safe to d/c from PT stand-point.  Will sign off.  Kreg Shropshire, DPT 07/28/2020, 10:05 AM

## 2020-07-28 NOTE — ED Notes (Signed)
Admitting Provider at bedside. 

## 2020-07-28 NOTE — ED Notes (Signed)
Xray tech at bedside.

## 2020-07-28 NOTE — ED Notes (Signed)
Pt given coffee and repositioned , husband is at the bedside

## 2020-07-28 NOTE — Discharge Instructions (Signed)

## 2020-07-28 NOTE — Discharge Summary (Signed)
[place] [facility] [place] NAME: Kaitlyn Oliver    MR#:  [id number]  DATE OF BIRTH:  1933-09-05  DATE OF ADMISSION:  07/27/2020 ADMITTING PHYSICIAN: Rhetta Mura, DO  DATE OF DISCHARGE: 07/28/2020  9:52 AM  PRIMARY CARE PHYSICIAN: Juline Patch, MD    ADMISSION DIAGNOSIS:  Hypokalemia [E87.6]  DISCHARGE DIAGNOSIS:  Principal Problem:   Hypokalemia Active Problems:   Essential (primary) hypertension   Hypothyroidism   GERD (gastroesophageal reflux disease)   Elevated troponin   Hypomagnesemia   Headache   AF (paroxysmal atrial fibrillation) ([place])   SECONDARY DIAGNOSIS:   Past Medical History:  Diagnosis Date  . Anxiety   . Arthritis    thumbs, hands  . Cancer [facility]) 1991   bilateral breast  . Depression   . GERD (gastroesophageal reflux disease)   . Headache    occasional - pinched nerve in neck  . Hypertension   . Hypothyroidism   . Thyroid disease   . Wears dentures    full upper, partial lower    HOSPITAL COURSE:   1.  Hypokalemia this was replaced during the hospital course.  Potassium was 2.8 on presentation and improved up to 3.4.  Discontinuing hydrochlorothiazide will help out with this.  And continue potassium supplementation as outpatient. 2.  Hypomagnesemia.  Magnesium was 1.6 on presentation and up to 2.2 upon discharge. 3.  Accelerated hypertension on presentation.  As outpatient the patient is on valsartan and Norvasc and hydrochlorothiazide.  With the electrolyte abnormalities I will get rid of the hydrochlorothiazide.  The patient states that Norvasc has caused some throat swelling.  I increased her valsartan to 80 mg daily.  I offered her to go up to 160 mg daily but she declined.  I told her if she takes her blood pressure at home and its up to 200 again she can increase the losartan to twice daily dosing.  Follow-up with PMD 1 week.  MRI of the brain was negative for acute events.  Findings do show chronic  microvascular ischemia. 4.  Elevated troponin without chest pain or shortness of breath likely demand ischemia from elevated blood pressure. 5.  Paroxysmal atrial fibrillation.  Not on any anticoagulation.  Stroke risk is higher being off anticoagulation.  Follow-up with cardiology as outpatient 6.  Hypothyroidism unspecified continue thyroid replacement.  TSH 4.4. 7.  Radiologist commented on cirrhosis on the CT scan.  Follow-up as outpatient  DISCHARGE CONDITIONS:   Satisfactory  CONSULTS OBTAINED:  None  DRUG ALLERGIES:   Allergies  Allergen Reactions  . Adhesive [Tape] Dermatitis    Can not use plastic tape. Paper tape ok   . Amlodipine Swelling    Throat swelling  . Asa [Aspirin]     Itchy   . Penicillin G Itching    Has patient had a PCN reaction causing immediate rash, facial/tongue/throat swelling, SOB or lightheadedness with hypotension: No Has patient had a PCN reaction causing severe rash involving mucus membranes or skin necrosis: No Has patient had a PCN reaction that required hospitalization: No Has patient had a PCN reaction occurring within the last 10 years: No If all of the above answers are "NO", then may proceed with Cephalosporin use.   Marland Kitchen Hydrocodone-Acetaminophen Itching and Rash    DISCHARGE MEDICATIONS:   Allergies as of 07/28/2020      Reactions   Adhesive [tape] Dermatitis   Can not use plastic tape. Paper tape ok    Amlodipine  Swelling   Throat swelling   Asa [aspirin]    Itchy   Penicillin G Itching   Has patient had a PCN reaction causing immediate rash, facial/tongue/throat swelling, SOB or lightheadedness with hypotension: No Has patient had a PCN reaction causing severe rash involving mucus membranes or skin necrosis: No Has patient had a PCN reaction that required hospitalization: No Has patient had a PCN reaction occurring within the last 10 years: No If all of the above answers are "NO", then may proceed with Cephalosporin use.    Hydrocodone-acetaminophen Itching, Rash      Medication List    STOP taking these medications   amLODipine 5 MG tablet Commonly known as: NORVASC   doxycycline 100 MG tablet Commonly known as: VIBRA-TABS   hydrochlorothiazide 12.5 MG tablet Commonly known as: HYDRODIURIL     TAKE these medications   Euthyrox 50 MCG tablet Generic drug: levothyroxine Take 50 mcg by mouth daily.   omeprazole 20 MG capsule Commonly known as: PRILOSEC Take 1 capsule (20 mg total) by mouth daily.   potassium chloride 10 MEQ tablet Commonly known as: KLOR-CON Take by mouth.   valsartan 80 MG tablet Commonly known as: DIOVAN Take 1 tablet (80 mg total) by mouth daily. What changed:   medication strength  how much to take        DISCHARGE INSTRUCTIONS:   Follow-up PMD 5 days  If you experience worsening of your admission symptoms, develop shortness of breath, life threatening emergency, suicidal or homicidal thoughts you must seek medical attention immediately by calling 911 or calling your MD immediately  if symptoms less severe.  You Must read complete instructions/literature along with all the possible adverse reactions/side effects for all the Medicines you take and that have been prescribed to you. Take any new Medicines after you have completely understood and accept all the possible adverse reactions/side effects.   Please note  You were cared for by a hospitalist during your hospital stay. If you have any questions about your discharge medications or the care you received while you were in the hospital after you are discharged, you can call the unit and asked to speak with the hospitalist on call if the hospitalist that took care of you is not available. Once you are discharged, your primary care physician will handle any further medical issues. Please note that NO REFILLS for any discharge medications will be authorized once you are discharged, as it is imperative that you return  to your primary care physician (or establish a relationship with a primary care physician if you do not have one) for your aftercare needs so that they can reassess your need for medications and monitor your lab values.    Today   CHIEF COMPLAINT:   Chief Complaint  Patient presents with  . Dizziness  . Hypertension    HISTORY OF PRESENT ILLNESS:  Sharifa Libertini  is a 87 y.o. female with a known history of came in with dizziness and elevated blood pressure   VITAL SIGNS:  Blood pressure (!) 159/81, pulse 64, temperature 97.9 F (36.6 C), temperature source Oral, resp. rate 17, height 5\' 5"  (1.651 m), weight 87.5 kg, SpO2 97 %.   PHYSICAL EXAMINATION:  GENERAL:  87 y.o.-year-old patient lying in the bed with no acute distress.  EYES: Pupils equal, round, reactive to light and accommodation. No scleral icterus.  HEENT: Head atraumatic, normocephalic. Oropharynx and nasopharynx clear.  LUNGS: Normal breath sounds bilaterally, no wheezing, rales,rhonchi or crepitation. No  use of accessory muscles of respiration.  CARDIOVASCULAR: S1, S2 normal. No murmurs, rubs, or gallops.  ABDOMEN: Soft, non-tender, non-distended  EXTREMITIES: No pedal edema, cyanosis, or clubbing.  NEUROLOGIC: Cranial nerves II through XII are intact. Muscle strength 5/5 in all extremities. Sensation intact. Gait not checked.  PSYCHIATRIC: The patient is alert and oriented x 3.  SKIN: No obvious rash, lesion, or ulcer.   DATA REVIEW:   CBC Recent Labs  Lab 07/28/20 0448  WBC 5.1  HGB 11.2*  HCT 33.3*  PLT 142*    Chemistries  Recent Labs  Lab 07/27/20 2203 07/28/20 0042 07/28/20 0448  NA 138  --  139  K 2.8*  --  3.4*  CL 102  --  104  CO2 26  --  25  GLUCOSE 122*  --  104*  BUN 14  --  12  CREATININE 1.02*  --  0.93  CALCIUM 9.5  --  9.1  MG  --    < > 2.2  AST 36  --   --   ALT 30  --   --   ALKPHOS 59  --   --   BILITOT 0.8  --   --    < > = values in this interval not displayed.     Cardiac Enzymes Troponin 60, 61  Microbiology Results  Results for orders placed or performed during the hospital encounter of 07/27/20  Resp Panel by RT-PCR (Flu A&B, Covid) Nasopharyngeal Swab     Status: None   Collection Time: 07/28/20 12:42 AM   Specimen: Nasopharyngeal Swab; Nasopharyngeal(NP) swabs in vial transport medium  Result Value Ref Range Status   SARS Coronavirus 2 by RT PCR NEGATIVE NEGATIVE Final    Comment: (NOTE) SARS-CoV-2 target nucleic acids are NOT DETECTED.  The SARS-CoV-2 RNA is generally detectable in upper respiratory specimens during the acute phase of infection. The lowest concentration of SARS-CoV-2 viral copies this assay can detect is 138 copies/mL. A negative result does not preclude SARS-Cov-2 infection and should not be used as the sole basis for treatment or other patient management decisions. A negative result may occur with  improper specimen collection/handling, submission of specimen other than nasopharyngeal swab, presence of viral mutation(s) within the areas targeted by this assay, and inadequate number of viral copies(<138 copies/mL). A negative result must be combined with clinical observations, patient history, and epidemiological information. The expected result is Negative.  Fact Sheet for Patients:  EntrepreneurPulse.com.au  Fact Sheet for Healthcare Providers:  IncredibleEmployment.be  This test is no t yet approved or cleared by the [place] FDA and  has been authorized for detection and/or diagnosis of SARS-CoV-2 by FDA under an Emergency Use Authorization (EUA). This EUA will remain  in effect (meaning this test can be used) for the duration of the COVID-19 declaration under Section 564(b)(1) of the Act, 21 U.S.C.section 360bbb-3(b)(1), unless the authorization is terminated  or revoked sooner.       Influenza A by PCR NEGATIVE NEGATIVE Final   Influenza B by PCR NEGATIVE  NEGATIVE Final    Comment: (NOTE) The Xpert Xpress SARS-CoV-2/FLU/RSV plus assay is intended as an aid in the diagnosis of influenza from Nasopharyngeal swab specimens and should not be used as a sole basis for treatment. Nasal washings and aspirates are unacceptable for Xpert Xpress SARS-CoV-2/FLU/RSV testing.  Fact Sheet for Patients: EntrepreneurPulse.com.au  Fact Sheet for Healthcare Providers: IncredibleEmployment.be  This test is not yet approved or cleared by the [place]  FDA and has been authorized for detection and/or diagnosis of SARS-CoV-2 by FDA under an Emergency Use Authorization (EUA). This EUA will remain in effect (meaning this test can be used) for the duration of the COVID-19 declaration under Section 564(b)(1) of the Act, 21 U.S.C. section 360bbb-3(b)(1), unless the authorization is terminated or revoked.  Performed at [facility], [address] [place], [place] [address]     RADIOLOGY:  CT ABDOMEN PELVIS WO CONTRAST  Result Date: 07/27/2020 CLINICAL DATA:  Abdominal distension EXAM: CT ABDOMEN AND PELVIS WITHOUT CONTRAST TECHNIQUE: Multidetector CT imaging of the abdomen and pelvis was performed following the standard protocol without IV contrast. COMPARISON:  None. FINDINGS: LOWER CHEST: Normal. HEPATOBILIARY: Diffusely nodular hepatic contours with relative hypertrophy of the caudate and left hepatic lobe, consistent with hepatic cirrhosis. No focal liver lesion. No biliary dilatation. Status post cholecystectomy. PANCREAS: Normal pancreas. No ductal dilatation or peripancreatic fluid collection. SPLEEN: Normal. ADRENALS/URINARY TRACT: The adrenal glands are normal. No hydronephrosis, nephroureterolithiasis or solid renal mass. The urinary bladder is normal for degree of distention STOMACH/BOWEL: There is no hiatal hernia. Normal duodenal course and caliber. Postsurgical changes in the right lower quadrant again  noted. Rectosigmoid diverticulosis without acute inflammation. The appendix is not visualized. No right lower quadrant inflammation or free fluid. VASCULAR/LYMPHATIC: There is calcific atherosclerosis of the abdominal aorta. No lymphadenopathy. REPRODUCTIVE: Status post hysterectomy. No adnexal mass. MUSCULOSKELETAL. Multilevel degenerative disc disease and facet arthrosis. No bony spinal canal stenosis. OTHER: None. IMPRESSION: 1. No acute abnormality of the abdomen or pelvis. 2. Hepatic cirrhosis. Aortic Atherosclerosis (ICD10-I70.0). Electronically Signed   By: Ulyses Jarred M.D.   On: 07/27/2020 22:37   CT Head Wo Contrast  Result Date: 07/27/2020 CLINICAL DATA:  Dizziness EXAM: CT HEAD WITHOUT CONTRAST TECHNIQUE: Contiguous axial images were obtained from the base of the skull through the vertex without intravenous contrast. COMPARISON:  None. FINDINGS: Brain: There is no mass, hemorrhage or extra-axial collection. The size and configuration of the ventricles and extra-axial CSF spaces are normal. There is hypoattenuation of the white matter, most commonly indicating chronic small vessel disease. 12 mm meningioma over the superior left convexity (coronal image 42). No mass effect on the underlying brain. Vascular: No abnormal hyperdensity of the major intracranial arteries or dural venous sinuses. No intracranial atherosclerosis. Skull: The visualized skull base, calvarium and extracranial soft tissues are normal. Sinuses/Orbits: No fluid levels or advanced mucosal thickening of the visualized paranasal sinuses. No mastoid or middle ear effusion. The orbits are normal. IMPRESSION: 1. No acute intracranial abnormality. 2. 12 mm meningioma over the superior left convexity without mass effect on the underlying brain. 3. Chronic small vessel disease. Electronically Signed   By: Ulyses Jarred M.D.   On: 07/27/2020 22:33   MR Brain W and Wo Contrast  Result Date: 07/28/2020 CLINICAL DATA:  Dizziness EXAM: MRI  HEAD WITHOUT AND WITH CONTRAST TECHNIQUE: Multiplanar, multiecho pulse sequences of the brain and surrounding structures were obtained without and with intravenous contrast. CONTRAST:  59mL GADAVIST GADOBUTROL 1 MMOL/ML IV SOLN COMPARISON:  01/16/2014 FINDINGS: Brain: No acute infarct, mass effect or extra-axial collection. No acute or chronic hemorrhage. Hyperintense T2-weighted signal is moderately widespread throughout the white matter. Generalized volume loss without a clear lobar predilection. The midline structures are normal. Unchanged appearance of superior left convexity meningioma that measures 8 mm. No underlying parenchymal abnormality. Vascular: Major flow voids are preserved. Skull and upper cervical spine: Normal calvarium and skull base. Visualized upper cervical spine  and soft tissues are normal. Sinuses/Orbits:No paranasal sinus fluid levels or advanced mucosal thickening. No mastoid or middle ear effusion. Normal orbits. IMPRESSION: 1. No acute intracranial abnormality. 2. Findings of chronic microvascular ischemia and generalized volume loss without a clear lobar predilection. Electronically Signed   By: Ulyses Jarred M.D.   On: 07/28/2020 01:18   DG Chest Port 1 View  Result Date: 07/28/2020 CLINICAL DATA:  87 year old female with nausea and dizziness. EXAM: PORTABLE CHEST 1 VIEW COMPARISON:  CT Chest, Abdomen, and Pelvis 05/20/2020 and earlier. FINDINGS: Portable AP upright view at 0657 hours. Stable cardiac size and mediastinal contours, borderline to mild cardiomegaly. Lung volumes are within normal limits. Visualized tracheal air column is within normal limits. Allowing for portable technique the lungs are clear. No pneumothorax or pleural effusion. Chronic postoperative changes at both humeral heads. No acute osseous abnormality identified. Paucity of bowel gas in the upper abdomen. IMPRESSION: Negative portable chest. Electronically Signed   By: Genevie Ann M.D.   On: 07/28/2020 07:10     EKG:   Sinus bradycardia 59 bpm  Management plans discussed with the patient, family and they are in agreement.  CODE STATUS:  Code Status History    Date Active Date Inactive Code Status Order ID Comments User Context   07/27/2020 2357 07/28/2020 1503 Full Code [id number]  Rhetta Mura, DO ED   01/01/2020 1339 01/02/2020 2012 Full Code [id number]  Ivor Costa, MD ED   01/01/2020 0805 01/01/2020 1339 Partial Code [id number]  Ivor Costa, MD ED   01/16/2018 0035 01/19/2018 1843 Full Code [id number]  Lance Coon, MD ED   11/29/2017 1343 12/01/2017 1723 Full Code [id number]  Salary, Avel Peace, MD Inpatient   05/30/2017 1027 06/01/2017 1831 Full Code [id number]  Hessie Knows, MD Inpatient   08/30/2016 1444 08/30/2016 1855 Full Code [id number]  Hessie Knows, MD Inpatient   Advance Care Planning Activity    Questions for Most Recent Historical Code Status (Order [id number])         Advance Directive Documentation   Flowsheet Row Most Recent Value  Type of Advance Directive Healthcare Power of Attorney, Living will  Pre-existing out of facility DNR order (yellow form or pink MOST form) --  "MOST" Form in Place? --      TOTAL TIME TAKING CARE OF THIS PATIENT: 35 minutes.    Loletha Grayer M.D on 07/28/2020 at 6:57 PM  Between 7am to 6pm - Pager - [phone]  After 6pm go to [email or web address] - password [email or web address]  [facility]  CC: Primary care physician; Juline Patch, MD

## 2020-08-04 ENCOUNTER — Ambulatory Visit (INDEPENDENT_AMBULATORY_CARE_PROVIDER_SITE_OTHER): Payer: Medicare HMO | Admitting: Pharmacist

## 2020-08-04 DIAGNOSIS — E034 Atrophy of thyroid (acquired): Secondary | ICD-10-CM

## 2020-08-04 DIAGNOSIS — I10 Essential (primary) hypertension: Secondary | ICD-10-CM | POA: Diagnosis not present

## 2020-08-04 NOTE — Progress Notes (Addendum)
Chronic Care Management Pharmacy Note  08/04/2020 Name:  Kaitlyn Oliver MRN:  [id number] DOB:  04/25/1933  Subjective: Kaitlyn Oliver is an 87 y.o. year old female who is a primary patient of Juline Patch, MD.  The CCM team was consulted for assistance with disease management and care coordination needs.    Engaged with patient by telephone for follow up visit in response to provider referral for pharmacy case management and/or care coordination services.   Consent to Services:  The patient was given information about Chronic Care Management services, agreed to services, and gave verbal consent prior to initiation of services.  Please see initial visit note for detailed documentation.   Patient Care Team: Juline Patch, MD as PCP - General (Family Medicine) Vladimir Faster, [facility] (Pharmacist) Recent Office visits: 4/14/22Ronnald Ramp (PCP)- Doxy 100 mg bid sinusitis  Recent consult visits: 07/16/20- Margarito Courser (cards) valsartan 40 mg qd, patient self decreased hctx to 12.$RemoveBe'5mg'AxRquTyCm$ , BP  07/07/20- [place](GI)- Zenpep 25/79/105K 2 caps tid -pt not taking due to cost 07/02/20-[name](Cards)-HCYZ 25 mg qd 04/28/20- Dr. Saralyn Pilar, Cardiology- Holter mon. Review- continue curretn meds, defer anticoag, DASH diet  Hospital visits:  October 2021- ED- afib, vetn rate 130-140s 07/27/20-ED hypokalemia HTN BP 209/85 Valsartan increased to 80 mg qd , sinus brady on ecg, abd ct consistent with cirrhosis, head CT meningioma? MRI showed no acute abnormality, cxr negative  Objective:  Lab Results  Component Value Date   CREATININE 0.93 07/28/2020   BUN 12 07/28/2020   GFRNONAA >60 07/28/2020   GFRAA >60 11/22/2019   NA 139 07/28/2020   K 3.4 (L) 07/28/2020   CALCIUM 9.1 07/28/2020   CO2 25 07/28/2020    Lab Results  Component Value Date/Time   HGBA1C 5.6 01/01/2020 02:31 PM    Last diabetic Eye exam: No results found for: HMDIABEYEEXA  Last diabetic Foot exam: No results found for:  HMDIABFOOTEX   Lab Results  Component Value Date   CHOL 227 (H) 02/15/2017   HDL 33 (L) 02/15/2017   LDLCALC Comment 02/15/2017   TRIG 534 (H) 02/15/2017   CHOLHDL 6.9 (H) 02/15/2017    Hepatic Function Latest Ref Rng & Units 07/27/2020 05/20/2020 11/22/2019  Total Protein 6.5 - 8.1 g/dL 7.1 7.8 7.6  Albumin 3.5 - 5.0 g/dL 3.9 4.4 4.3  AST 15 - 41 U/L 36 59(H) 57(H)  ALT 0 - 44 U/L 30 49(H) 43  Alk Phosphatase 38 - 126 U/L 59 68 64  Total Bilirubin 0.3 - 1.2 mg/dL 0.8 0.8 0.8    Lab Results  Component Value Date/Time   TSH 4.406 07/28/2020 04:48 AM   TSH 4.658 (H) 01/01/2020 06:32 AM   TSH 4.600 (H) 10/17/2019 10:19 AM   TSH 3.240 10/02/2019 09:00 AM    CBC Latest Ref Rng & Units 07/28/2020 07/27/2020 05/20/2020  WBC 4.0 - 10.5 K/uL 5.1 5.2 5.1  Hemoglobin 12.0 - 15.0 g/dL 11.2(L) 11.8(L) 13.2  Hematocrit 36.0 - 46.0 % 33.3(L) 34.7(L) 39.5  Platelets 150 - 400 K/uL 142(L) 143(L) 159    No results found for: VD25OH  Clinical ASCVD: Yes  as seen on chest CT The ASCVD Risk score Mikey Bussing DC Jr., et al., 2013) failed to calculate for the following reasons:   The 2013 ASCVD risk score is only valid for ages 4 to 66    Depression screen PHQ 2/9 06/25/2020 01/06/2020 12/02/2019  Decreased Interest 1 0 0  Down, Depressed, Hopeless 1 0 0  PHQ -  2 Score 2 0 0  Altered sleeping 3 1 -  Tired, decreased energy 2 0 -  Change in appetite 2 0 -  Feeling bad or failure about yourself  0 0 -  Trouble concentrating 1 0 -  Moving slowly or fidgety/restless 0 0 -  Suicidal thoughts 0 0 -  PHQ-9 Score 10 1 -  Difficult doing work/chores - Not difficult at all -  Some recent data might be hidden     CHA2DS2-VASc Score = 5  The patient's score is based upon: CHF History: No HTN History: Yes Diabetes History: No Stroke History: No Vascular Disease History: Yes Age Score: 2 Gender Score: 1  {  Social History   Tobacco Use  Smoking Status Former Smoker  . Packs/day: 0.25  . Years:  10.00  . Pack years: 2.50  . Types: Cigarettes  . Quit date: 74  . Years since quitting: 31.4  Smokeless Tobacco Never Used  Tobacco Comment   smoking cessation materials not required   BP Readings from Last 3 Encounters:  07/28/20 (!) 159/81  06/25/20 140/86  01/06/20 120/70   Pulse Readings from Last 3 Encounters:  07/28/20 64  06/25/20 70  01/06/20 80   Wt Readings from Last 3 Encounters:  07/27/20 193 lb (87.5 kg)  06/25/20 194 lb (88 kg)  01/06/20 198 lb (89.8 kg)    Assessment/Interventions: Review of patient past medical history, allergies, medications, health status, including review of consultants reports, laboratory and other test data, was performed as part of comprehensive evaluation and provision of chronic care management services.   SDOH:  (Social Determinants of Health) assessments and interventions performed: No    Immunization History  Administered Date(s) Administered  . Fluad Quad(high Dose 65+) 12/25/2019  . Influenza, High Dose Seasonal PF 11/22/2018  . Influenza,inj,Quad PF,6+ Mos 11/07/2017  . Influenza-Unspecified 12/08/2015, 12/12/2016, 11/22/2018  . PFIZER(Purple Top)SARS-COV-2 Vaccination 04/04/2019, 04/25/2019  . Pneumococcal Conjugate-13 04/06/2017  . Pneumococcal Polysaccharide-23 12/03/2018  . Zoster Recombinat (Shingrix) 07/26/2017, 10/02/2017    Conditions to be addressed/monitored:  Hypertension, Hyperlipidemia, Hypothyroidism and remote breast cancer with bilateral mastectomy, neuroendocrine carcinome SI s/p resection, thoracoabdominal aortic aneurysm  There are no care plans that you recently modified to display for this patient.    Medication Assistance: None required.  Patient affirms current coverage meets needs.  Patient's preferred pharmacy is: [facility]--she plans to switch all medications from [facility]  To [facility]. Instructed patient on transfer process.  Uses pill box? No - doesn't need Pt endorses 99% compliance  We  discussed: Benefits of medication synchronization, packaging and delivery as well as enhanced pharmacist oversight with [facility]. Patient decided to: Continue current medication management strategy  Care Plan and Follow Up Patient Decision:  Patient agrees to Care Plan and Follow-up.  Plan: Telephone follow up appointment with care management team member scheduled for:  2 months   [name]. Kenton Kingfisher PharmD, [facility] [phone]

## 2020-08-06 NOTE — Patient Instructions (Addendum)
Visit Information  It was a pleasure speaking with you today. Thank you for letting me be part of your clinical team. Please call with any questions or concerns.   Goals Addressed            This Visit's Progress   . Lifestyle Change-Hypertension   On track    Timeframe:  Long-Range Goal Priority:  High Start Date:                             Expected End Date:                       Follow Up Date 08/04/20    - agree to work together to make changes - learn about high blood pressure     Why is this important?    The changes that you are asked to make may be hard to do.   This is especially true when the changes are life-long.   Knowing why it is important to you is the first step.   Working on the change with your family or support person helps you not feel alone.   Reward yourself and family or support person when goals are met. This can be an activity you choose like bowling, hiking, biking, swimming or shooting hoops.     Notes:     Marland Kitchen Manage My Medicine   On track    Timeframe:  Long-Range Goal Priority:  Medium Start Date:                             Expected End Date:                       Follow Up Date 08/04/20    - call for medicine refill 2 or 3 days before it runs out - call if I am sick and can't take my medicine - keep a list of all the medicines I take; vitamins and herbals too - use a pillbox to sort medicine    Why is this important?   . These steps will help you keep on track with your medicines.   Notes: Patient plans to transfer all medications to [facility]. Requesting refill levothyroxine--not needed per chart should have 1 year supply at [facility]. Patient informed of transfer process and voiced understanding.    . Track and Manage My Blood Pressure-Hypertension   On track    Timeframe:  Long-Range Goal Priority:  High Start Date:                             Expected End Date:                       Follow Up Date 09/15/20   - check blood  pressure daily - write blood pressure results in a log or diary    Why is this important?    You won't feel high blood pressure, but it can still hurt your blood vessels.   High blood pressure can cause heart or kidney problems. It can also cause a stroke.   Making lifestyle changes like losing a little weight or eating less salt will help.   Checking your blood pressure at home and at different times of the day can help to control  blood pressure.   If the doctor prescribes medicine remember to take it the way the doctor ordered.   Call the office if you cannot afford the medicine or if there are questions about it.     Notes:        The patient verbalized understanding of instructions, educational materials, and care plan provided today and agreed to receive a mailed copy of patient instructions, educational materials, and care plan.   The pharmacy team will reach out to the patient again over the next 30 days days.   [name]. Howell Groesbeck PharmD, BCPS Clinical Pharmacist [phone]  Managing Your Hypertension Hypertension, also called high blood pressure, is when the force of the blood pressing against the walls of the arteries is too strong. Arteries are blood vessels that carry blood from your heart throughout your body. Hypertension forces the heart to work harder to pump blood and may cause the arteries to become narrow or stiff. Understanding blood pressure readings Your personal target blood pressure may vary depending on your medical conditions, your age, and other factors. A blood pressure reading includes a higher number over a lower number. Ideally, your blood pressure should be below 120/80. You should know that:  The first, or top, number is called the systolic pressure. It is a measure of the pressure in your arteries as your heart beats.  The second, or bottom number, is called the diastolic pressure. It is a measure of the pressure in your arteries as the heart  relaxes. Blood pressure is classified into four stages. Based on your blood pressure reading, your health care provider may use the following stages to determine what type of treatment you need, if any. Systolic pressure and diastolic pressure are measured in a unit called mmHg. Normal  Systolic pressure: below 174.  Diastolic pressure: below 80. Elevated  Systolic pressure: 081-448.  Diastolic pressure: below 80. Hypertension stage 1  Systolic pressure: 185-631.  Diastolic pressure: 49-70. Hypertension stage 2  Systolic pressure: 263 or above.  Diastolic pressure: 90 or above. How can this condition affect me? Managing your hypertension is an important responsibility. Over time, hypertension can damage the arteries and decrease blood flow to important parts of the body, including the brain, heart, and kidneys. Having untreated or uncontrolled hypertension can lead to:  A heart attack.  A stroke.  A weakened blood vessel (aneurysm).  Heart failure.  Kidney damage.  Eye damage.  Metabolic syndrome.  Memory and concentration problems.  Vascular dementia. What actions can I take to manage this condition? Hypertension can be managed by making lifestyle changes and possibly by taking medicines. Your health care provider will help you make a plan to bring your blood pressure within a normal range. Nutrition  Eat a diet that is high in fiber and potassium, and low in salt (sodium), added sugar, and fat. An example eating plan is called the Dietary Approaches to Stop Hypertension (DASH) diet. To eat this way: ? Eat plenty of fresh fruits and vegetables. Try to fill one-half of your plate at each meal with fruits and vegetables. ? Eat whole grains, such as whole-wheat pasta, brown rice, or whole-grain bread. Fill about one-fourth of your plate with whole grains. ? Eat low-fat dairy products. ? Avoid fatty cuts of meat, processed or cured meats, and poultry with skin. Fill  about one-fourth of your plate with lean proteins such as fish, chicken without skin, beans, eggs, and tofu. ? Avoid pre-made and processed foods. These tend to  be higher in sodium, added sugar, and fat.  Reduce your daily sodium intake. Most people with hypertension should eat less than 1,500 mg of sodium a day.   Lifestyle  Work with your health care provider to maintain a healthy body weight or to lose weight. Ask what an ideal weight is for you.  Get at least 30 minutes of exercise that causes your heart to beat faster (aerobic exercise) most days of the week. Activities may include walking, swimming, or biking.  Include exercise to strengthen your muscles (resistance exercise), such as weight lifting, as part of your weekly exercise routine. Try to do these types of exercises for 30 minutes at least 3 days a week.  Do not use any products that contain nicotine or tobacco, such as cigarettes, e-cigarettes, and chewing tobacco. If you need help quitting, ask your health care provider.  Control any long-term (chronic) conditions you have, such as high cholesterol or diabetes.  Identify your sources of stress and find ways to manage stress. This may include meditation, deep breathing, or making time for fun activities.   Alcohol use  Do not drink alcohol if: ? Your health care provider tells you not to drink. ? You are pregnant, may be pregnant, or are planning to become pregnant.  If you drink alcohol: ? Limit how much you use to:  0-1 drink a day for women.  0-2 drinks a day for men. ? Be aware of how much alcohol is in your drink. In the [place] one drink equals one 12 oz bottle of beer (355 mL), one 5 oz glass of wine (148 mL), or one 1 oz glass of hard liquor (44 mL). Medicines Your health care provider may prescribe medicine if lifestyle changes are not enough to get your blood pressure under control and if:  Your systolic blood pressure is 130 or higher.  Your diastolic  blood pressure is 80 or higher. Take medicines only as told by your health care provider. Follow the directions carefully. Blood pressure medicines must be taken as told by your health care provider. The medicine does not work as well when you skip doses. Skipping doses also puts you at risk for problems. Monitoring Before you monitor your blood pressure:  Do not smoke, drink caffeinated beverages, or exercise within 30 minutes before taking a measurement.  Use the bathroom and empty your bladder (urinate).  Sit quietly for at least 5 minutes before taking measurements. Monitor your blood pressure at home as told by your health care provider. To do this:  Sit with your back straight and supported.  Place your feet flat on the floor. Do not cross your legs.  Support your arm on a flat surface, such as a table. Make sure your upper arm is at heart level.  Each time you measure, take two or three readings one minute apart and record the results. You may also need to have your blood pressure checked regularly by your health care provider.   General information  Talk with your health care provider about your diet, exercise habits, and other lifestyle factors that may be contributing to hypertension.  Review all the medicines you take with your health care provider because there may be side effects or interactions.  Keep all visits as told by your health care provider. Your health care provider can help you create and adjust your plan for managing your high blood pressure. Where to find more information  National Heart, Lung, and Blood Institute: https://wilson-eaton.com/  American Heart Association: www.heart.org Contact a health care provider if:  You think you are having a reaction to medicines you have taken.  You have repeated (recurrent) headaches.  You feel dizzy.  You have swelling in your ankles.  You have trouble with your vision. Get help right away if:  You develop a severe  headache or confusion.  You have unusual weakness or numbness, or you feel faint.  You have severe pain in your chest or abdomen.  You vomit repeatedly.  You have trouble breathing. These symptoms may represent a serious problem that is an emergency. Do not wait to see if the symptoms will go away. Get medical help right away. Call your local emergency services (911 in the [place] Do not drive yourself to the hospital. Summary  Hypertension is when the force of blood pumping through your arteries is too strong. If this condition is not controlled, it may put you at risk for serious complications.  Your personal target blood pressure may vary depending on your medical conditions, your age, and other factors. For most people, a normal blood pressure is less than 120/80.  Hypertension is managed by lifestyle changes, medicines, or both.  Lifestyle changes to help manage hypertension include losing weight, eating a healthy, low-sodium diet, exercising more, stopping smoking, and limiting alcohol. This information is not intended to replace advice given to you by your health care provider. Make sure you discuss any questions you have with your health care provider. Document Revised: 04/05/2019 Document Reviewed: 01/29/2019 Elsevier Patient Education  2021 [employer].

## 2020-08-18 DIAGNOSIS — E876 Hypokalemia: Secondary | ICD-10-CM | POA: Diagnosis not present

## 2020-08-18 DIAGNOSIS — R002 Palpitations: Secondary | ICD-10-CM | POA: Diagnosis not present

## 2020-08-18 DIAGNOSIS — I25118 Atherosclerotic heart disease of native coronary artery with other forms of angina pectoris: Secondary | ICD-10-CM | POA: Diagnosis not present

## 2020-08-18 DIAGNOSIS — I480 Paroxysmal atrial fibrillation: Secondary | ICD-10-CM | POA: Diagnosis not present

## 2020-08-19 ENCOUNTER — Telehealth: Payer: Self-pay

## 2020-08-19 NOTE — Chronic Care Management (AMB) (Signed)
Chronic Care Management Pharmacy Assistant   Name: Chonda Rengifo  MRN: [id number] DOB: 03-May-1933  Reason for Encounter: Disease State Hypertension    Recent office visits:  None noted  Recent consult visits:  None noted  Hospital visits:  Medication Reconciliation was completed by comparing discharge summary, patient's EMR and Pharmacy list, and upon discussion with patient.  Admitted to the hospital on 07/27/2020 due to Dizziness. Discharge date was 07/28/2020. Discharged from [facility].    New?Medications Started at [facility] Discharge:?? -None noted  Medication Changes at Hospital Discharge: -Changed Valsartan 40mg  increased to 80 mg  Medications Discontinued at Hospital Discharge: -None noted  Medications that remain the same after Hospital Discharge:??  -All other medications will remain the same.    Medications: Outpatient Encounter Medications as of 08/19/2020  Medication Sig   EUTHYROX 50 MCG tablet Take 50 mcg by mouth daily.   hydrochlorothiazide (HYDRODIURIL) 12.5 MG tablet Take 25 mg by mouth daily.   omeprazole (PRILOSEC) 20 MG capsule Take 1 capsule (20 mg total) by mouth daily.   potassium chloride (KLOR-CON) 10 MEQ tablet Take by mouth.   valsartan (DIOVAN) 80 MG tablet Take 1 tablet (80 mg total) by mouth daily. (Patient not taking: Reported on 08/04/2020)   No facility-administered encounter medications on file as of 08/19/2020.   Reviewed chart prior to disease state call. Spoke with patient regarding BP  Recent Office Vitals: BP Readings from Last 3 Encounters:  07/28/20 (!) 159/81  06/25/20 140/86  01/06/20 120/70   Pulse Readings from Last 3 Encounters:  07/28/20 64  06/25/20 70  01/06/20 80    Wt Readings from Last 3 Encounters:  07/27/20 193 lb (87.5 kg)  06/25/20 194 lb (88 kg)  01/06/20 198 lb (89.8 kg)     Kidney Function Lab Results  Component Value Date/Time   CREATININE 0.93 07/28/2020 04:48 AM    CREATININE 1.02 (H) 07/27/2020 10:03 PM   CREATININE 1.14 01/16/2014 03:20 PM   CREATININE 1.07 12/09/2011 11:09 AM   GFRNONAA >60 07/28/2020 04:48 AM   GFRNONAA 49 (L) 01/16/2014 03:20 PM   GFRNONAA 50 (L) 12/09/2011 11:09 AM   GFRAA >60 11/22/2019 11:05 AM   GFRAA 59 (L) 01/16/2014 03:20 PM   GFRAA 58 (L) 12/09/2011 11:09 AM    BMP Latest Ref Rng & Units 07/28/2020 07/27/2020 05/20/2020  Glucose 70 - 99 mg/dL 673(A) 193(X) 902(I)  BUN 8 - 23 mg/dL 12 14 9   Creatinine 0.44 - 1.00 mg/dL 0.97 3.53(G) 9.92  BUN/Creat Ratio 12 - 28 - - -  Sodium 135 - 145 mmol/L 139 138 140  Potassium 3.5 - 5.1 mmol/L 3.4(L) 2.8(L) 3.7  Chloride 98 - 111 mmol/L 104 102 103  CO2 22 - 32 mmol/L 25 26 25   Calcium 8.9 - 10.3 mg/dL 9.1 9.5 42.6    Current antihypertensive regimen:  Hydrochlorothiazide 12.5 mg take 25 mg daily Valsartan 80 mg take 1 tab daily  How often are you checking your Blood Pressure? 3-5x per week   Current home BP readings:          08/20/20-140/80  What recent interventions/DTPs have been made by any provider to improve Blood Pressure control since last CPP Visit: None noted  Any recent hospitalizations or ED visits since last visit with CPP? No   What diet changes have been made to improve Blood Pressure Control?  Patient states she has improved her diet she has been cutting back on food and has  been losing weight  What exercise is being done to improve your Blood Pressure Control?  Patient states she works on her garden and walks her dog  Patient states she had stopped taking blood pressure medication due to her having an allergic reaction to all blood pressure medications. Patient states she is currently on a heart monitor due to her having AFIB.  Adherence Review: Is the patient currently on ACE/ARB medication? Yes Does the patient have >5 day gap between last estimated fill dates? No   Star Rating Drugs: Valsartan 80 mg Last filled:07/28/2020 30 DS   Tamirra Sienkiewicz  Carolin Coy, RMA Health Concierge

## 2020-09-07 DIAGNOSIS — Z961 Presence of intraocular lens: Secondary | ICD-10-CM | POA: Diagnosis not present

## 2020-09-07 DIAGNOSIS — H353131 Nonexudative age-related macular degeneration, bilateral, early dry stage: Secondary | ICD-10-CM | POA: Diagnosis not present

## 2020-09-08 DIAGNOSIS — K7460 Unspecified cirrhosis of liver: Secondary | ICD-10-CM | POA: Diagnosis not present

## 2020-09-08 DIAGNOSIS — K219 Gastro-esophageal reflux disease without esophagitis: Secondary | ICD-10-CM | POA: Diagnosis not present

## 2020-09-08 DIAGNOSIS — K8681 Exocrine pancreatic insufficiency: Secondary | ICD-10-CM | POA: Diagnosis not present

## 2020-09-15 ENCOUNTER — Ambulatory Visit (INDEPENDENT_AMBULATORY_CARE_PROVIDER_SITE_OTHER): Payer: Medicare HMO | Admitting: Pharmacist

## 2020-09-15 ENCOUNTER — Other Ambulatory Visit: Payer: Self-pay

## 2020-09-15 ENCOUNTER — Telehealth: Payer: Self-pay | Admitting: Pharmacist

## 2020-09-15 DIAGNOSIS — I10 Essential (primary) hypertension: Secondary | ICD-10-CM | POA: Diagnosis not present

## 2020-09-15 DIAGNOSIS — B372 Candidiasis of skin and nail: Secondary | ICD-10-CM

## 2020-09-15 DIAGNOSIS — K219 Gastro-esophageal reflux disease without esophagitis: Secondary | ICD-10-CM

## 2020-09-15 MED ORDER — NYSTATIN 100000 UNIT/GM EX CREA: 1.0000 "application " | TOPICAL_CREAM | Freq: Two times a day (BID) | CUTANEOUS | 0 refills | Status: DC

## 2020-09-15 NOTE — Progress Notes (Signed)
Chronic Care Management Pharmacy Note  09/15/2020 Name:  Kaitlyn Oliver MRN:  [id number] DOB:  [birth year]  Subjective: Kaitlyn Oliver is an 87 y.o. year old female who is a primary patient of Kaitlyn Patch, MD.  The CCM team was consulted for assistance with disease management and care coordination needs.    Engaged with patient by telephone for follow up visit in response to provider referral for pharmacy case management and/or care coordination services.   Consent to Services:  The patient was given information about Chronic Care Management services, agreed to services, and gave verbal consent prior to initiation of services.  Please see initial visit note for detailed documentation.   Patient Care Team: Kaitlyn Patch, MD as PCP - General (Family Medicine) Kaitlyn Oliver, [facility] (Pharmacist) Recent Office visits: 4/14/22Ronnald Oliver (PCP)- Doxy 100 mg bid sinusitis  Recent consult visits: 09/08/20-Kaitlyn Oliver ( GI)- BP 170/89 NAFLD cirrhosis f/u, suspect exocrine pancreatic insufficiency- Zenpep 25000u 2 meals 1 snack, follow up October 08/18/20-drane(cards) Diltiazem 60 mg prn palpitations . 30 minutes , Potassium 10 meq 2 tabs daily, 2 week holter monitor. Likely start anticoag at next visit 07/16/20- Drane (cards) valsartan 40 mg qd, patient self decreased hctx to 12.60m, BP  07/07/20- Kaitlyn Oliver(GI)- Zenpep 25/79/105K 2 caps tid -pt not taking due to cost 07/02/20-[name](Cards)-HCYZ 25 mg qd 04/28/20- Kaitlyn Oliver Cardiology- Holter mon. Review- continue curretn meds, defer anticoag, DASH diet  Hospital visits:  October 2021- ED- afib, vetn rate 130-140s 07/27/20-ED hypokalemia HTN BP 209/85 Valsartan increased to 80 mg qd , sinus brady on ecg, abd ct consistent with cirrhosis, head CT meningioma? MRI showed no acute abnormality, cxr negative  Objective:  Lab Results  Component Value Date   CREATININE 0.93 07/28/2020   BUN 12 07/28/2020   GFRNONAA >60 07/28/2020   GFRAA >60  11/22/2019   NA 139 07/28/2020   K 3.4 (L) 07/28/2020   CALCIUM 9.1 07/28/2020   CO2 25 07/28/2020    Lab Results  Component Value Date/Time   HGBA1C 5.6 01/01/2020 02:31 PM    Last diabetic Eye exam: No results found for: HMDIABEYEEXA  Last diabetic Foot exam: No results found for: HMDIABFOOTEX   Lab Results  Component Value Date   CHOL 227 (H) 02/15/2017   HDL 33 (L) 02/15/2017   LDLCALC Comment 02/15/2017   TRIG 534 (H) 02/15/2017   CHOLHDL 6.9 (H) 02/15/2017    Hepatic Function Latest Ref Rng & Units 07/27/2020 05/20/2020 11/22/2019  Total Protein 6.5 - 8.1 g/dL 7.1 7.8 7.6  Albumin 3.5 - 5.0 g/dL 3.9 4.4 4.3  AST 15 - 41 U/L 36 59(H) 57(H)  ALT 0 - 44 U/L 30 49(H) 43  Alk Phosphatase 38 - 126 U/L 59 68 64  Total Bilirubin 0.3 - 1.2 mg/dL 0.8 0.8 0.8    Lab Results  Component Value Date/Time   TSH 4.406 07/28/2020 04:48 AM   TSH 4.658 (H) 01/01/2020 06:32 AM   TSH 4.600 (H) 10/17/2019 10:19 AM   TSH 3.240 10/02/2019 09:00 AM    CBC Latest Ref Rng & Units 07/28/2020 07/27/2020 05/20/2020  WBC 4.0 - 10.5 K/uL 5.1 5.2 5.1  Hemoglobin 12.0 - 15.0 g/dL 11.2(L) 11.8(L) 13.2  Hematocrit 36.0 - 46.0 % 33.3(L) 34.7(L) 39.5  Platelets 150 - 400 K/uL 142(L) 143(L) 159    No results found for: VD25OH  Clinical ASCVD: Yes  as seen on chest CT The ASCVD Risk score (Mikey BussingDC Jr., et al., 2013) failed  to calculate for the following reasons:   The 2013 ASCVD risk score is only valid for ages 64 to 4    Depression screen PHQ 2/9 06/25/2020 01/06/2020 12/02/2019  Decreased Interest 1 0 0  Down, Depressed, Hopeless 1 0 0  PHQ - 2 Score 2 0 0  Altered sleeping 3 1 -  Tired, decreased energy 2 0 -  Change in appetite 2 0 -  Feeling bad or failure about yourself  0 0 -  Trouble concentrating 1 0 -  Moving slowly or fidgety/restless 0 0 -  Suicidal thoughts 0 0 -  PHQ-9 Score 10 1 -  Difficult doing work/chores - Not difficult at all -  Some recent data might be hidden      CHA2DS2-VASc Score = 5  The patient's score is based upon: CHF History: No HTN History: Yes Diabetes History: No Stroke History: No Vascular Disease History: Yes Age Score: 2 Gender Score: 1  {  Social History   Tobacco Use  Smoking Status Former   Packs/day: 0.25   Years: 10.00   Pack years: 2.50   Types: Cigarettes   Quit date: 1991   Years since quitting: 31.5  Smokeless Tobacco Never  Tobacco Comments   smoking cessation materials not required   BP Readings from Last 3 Encounters:  07/28/20 (!) 159/81  06/25/20 140/86  01/06/20 120/70   Pulse Readings from Last 3 Encounters:  07/28/20 64  06/25/20 70  01/06/20 80   Wt Readings from Last 3 Encounters:  07/27/20 193 lb (87.5 kg)  06/25/20 194 lb (88 kg)  01/06/20 198 lb (89.8 kg)    Assessment/Interventions: Review of patient past medical history, allergies, medications, health status, including review of consultants reports, laboratory and other test data, was performed as part of comprehensive evaluation and provision of chronic care management services.   SDOH:  (Social Determinants of Health) assessments and interventions performed: No    Immunization History  Administered Date(s) Administered   Fluad Quad(high Dose 65+) 12/25/2019   Influenza, High Dose Seasonal PF 11/22/2018   Influenza,inj,Quad PF,6+ Mos 11/07/2017   Influenza-Unspecified 12/08/2015, 12/12/2016, 11/22/2018   PFIZER(Purple Top)SARS-COV-2 Vaccination 04/04/2019, 04/25/2019   Pneumococcal Conjugate-13 04/06/2017   Pneumococcal Polysaccharide-23 12/03/2018   Zoster Recombinat (Shingrix) 07/26/2017, 10/02/2017    Conditions to be addressed/monitored:  Hypertension, Hyperlipidemia, Hypothyroidism and remote breast cancer with bilateral mastectomy, neuroendocrine carcinome SI s/p resection , thoracoabdominal aortic aneurysm  There are no care plans that you recently modified to display for this patient.    Medication Assistance:  None required.  Patient affirms current coverage meets needs.  Patient's preferred pharmacy is: [facility]--she plans [facility] Instructed patient on transfer process. Update 09/15/20 patient reports she is still using [facility] as her pharmacy.  Uses pill box? No - doesn't need Pt endorses 99% compliance  We discussed: Benefits of medication synchronization, packaging and delivery as well as enhanced pharmacist oversight with [facility]. Patient decided to: Continue current medication management strategy  Care Plan and Follow Up Patient Decision:  Patient agrees to Care Plan and Follow-up.  Plan: Telephone follow up appointment with care management team member scheduled for:  2 months    [name]. Kenton Kingfisher PharmD, [facility] [phone]

## 2020-09-15 NOTE — Patient Instructions (Signed)
Visit Information  It was a pleasure speaking with you today. Thank you for letting me be part of your clinical team. Please call with any questions or concerns.    Goals Addressed             This Visit's Progress    Lifestyle Change-Hypertension   On track    Timeframe:  Long-Range Goal Priority:  High Start Date:                             Expected End Date:                       Follow Up Date 08/04/20    - agree to work together to make changes - learn about high blood pressure     Why is this important?   The changes that you are asked to make may be hard to do.  This is especially true when the changes are life-long.  Knowing why it is important to you is the first step.  Working on the change with your family or support person helps you not feel alone.  Reward yourself and family or support person when goals are met. This can be an activity you choose like bowling, hiking, biking, swimming or shooting hoops.     Notes:       Manage My Medicine   On track    Timeframe:  Long-Range Goal Priority:  Medium Start Date:                             Expected End Date:                       Follow Up Date 08/04/20    - call for medicine refill 2 or 3 days before it runs out - call if I am sick and can't take my medicine - keep a list of all the medicines I take; vitamins and herbals too - use a pillbox to sort medicine    Why is this important?   These steps will help you keep on track with your medicines.   Notes: Patient plans to transfer all medications to [facility]. Requesting refill levothyroxine--not needed per chart should have 1 year supply at [facility]. Patient informed of transfer process and voiced understanding.      Track and Manage My Blood Pressure-Hypertension   On track    Timeframe:  Long-Range Goal Priority:  High Start Date:                             Expected End Date:                       Follow Up Date 09/15/20   - check blood pressure  daily - write blood pressure results in a log or diary    Why is this important?   You won't feel high blood pressure, but it can still hurt your blood vessels.  High blood pressure can cause heart or kidney problems. It can also cause a stroke.  Making lifestyle changes like losing a little weight or eating less salt will help.  Checking your blood pressure at home and at different times of the day can help to control blood pressure.  If the doctor  prescribes medicine remember to take it the way the doctor ordered.  Call the office if you cannot afford the medicine or if there are questions about it.     Notes:          Patient verbalizes understanding of instructions provided today and agrees to view in [place].   Telephone follow up appointment with pharmacy team member scheduled for: 1 month CPA  [name]. Kenton Kingfisher PharmD, [place] Clinical Pharmacist [phone]

## 2020-09-15 NOTE — Telephone Encounter (Signed)
Kaitlyn Oliver is requesting a refill on Nystatin cream.

## 2020-09-22 ENCOUNTER — Ambulatory Visit (INDEPENDENT_AMBULATORY_CARE_PROVIDER_SITE_OTHER): Payer: Medicare HMO | Admitting: Family Medicine

## 2020-09-22 ENCOUNTER — Other Ambulatory Visit: Payer: Self-pay

## 2020-09-22 ENCOUNTER — Encounter: Payer: Self-pay | Admitting: Family Medicine

## 2020-09-22 VITALS — BP 128/80 | HR 80 | Ht 65.0 in | Wt 194.0 lb

## 2020-09-22 DIAGNOSIS — B372 Candidiasis of skin and nail: Secondary | ICD-10-CM

## 2020-09-22 DIAGNOSIS — F32A Depression, unspecified: Secondary | ICD-10-CM | POA: Diagnosis not present

## 2020-09-22 DIAGNOSIS — R69 Illness, unspecified: Secondary | ICD-10-CM | POA: Diagnosis not present

## 2020-09-22 DIAGNOSIS — F419 Anxiety disorder, unspecified: Secondary | ICD-10-CM

## 2020-09-22 MED ORDER — NYSTATIN 100000 UNIT/GM EX CREA: 1.0000 "application " | TOPICAL_CREAM | Freq: Two times a day (BID) | CUTANEOUS | 0 refills | Status: DC

## 2020-09-22 MED ORDER — SERTRALINE HCL 25 MG PO TABS: 25.0000 mg | ORAL_TABLET | Freq: Every day | ORAL | 1 refills | Status: DC

## 2020-09-22 NOTE — Progress Notes (Signed)
Date:  09/22/2020   Name:  Kaitlyn Oliver   DOB:  08/14/1933   MRN:  [id number]   Chief Complaint: Depression (12 and 13) and Vaginitis (Needs nystatin refilled)  Depression        This is a recurrent problem.  The current episode started 1 to 4 weeks ago ("couple of weeks").   The onset quality is gradual.   The problem occurs constantly.  The most recent episode lasted 2 months.    The problem has been gradually worsening since onset.  Associated symptoms include fatigue, helplessness, hopelessness, irritable and sad.  Associated symptoms include no decreased concentration, does not have insomnia, no restlessness, no decreased interest, no appetite change, no body aches, no myalgias, no headaches, no indigestion and no suicidal ideas.  Past treatments include nothing.  Compliance with treatment is variable.  Lab Results  Component Value Date   CREATININE 0.93 07/28/2020   BUN 12 07/28/2020   NA 139 07/28/2020   K 3.4 (L) 07/28/2020   CL 104 07/28/2020   CO2 25 07/28/2020   Lab Results  Component Value Date   CHOL 227 (H) 02/15/2017   HDL 33 (L) 02/15/2017   LDLCALC Comment 02/15/2017   TRIG 534 (H) 02/15/2017   CHOLHDL 6.9 (H) 02/15/2017   Lab Results  Component Value Date   TSH 4.406 07/28/2020   Lab Results  Component Value Date   HGBA1C 5.6 01/01/2020   Lab Results  Component Value Date   WBC 5.1 07/28/2020   HGB 11.2 (L) 07/28/2020   HCT 33.3 (L) 07/28/2020   MCV 90.0 07/28/2020   PLT 142 (L) 07/28/2020   Lab Results  Component Value Date   ALT 30 07/27/2020   AST 36 07/27/2020   ALKPHOS 59 07/27/2020   BILITOT 0.8 07/27/2020     Review of Systems  Constitutional:  Positive for fatigue. Negative for appetite change, chills and fever.  HENT:  Negative for drooling, ear discharge, ear pain and sore throat.   Respiratory:  Negative for cough, shortness of breath and wheezing.   Cardiovascular:  Negative for chest pain, palpitations and leg swelling.   Gastrointestinal:  Negative for abdominal pain, blood in stool, constipation, diarrhea and nausea.  Endocrine: Negative for polydipsia.  Genitourinary:  Negative for dysuria, frequency, hematuria and urgency.  Musculoskeletal:  Negative for back pain, myalgias and neck pain.  Skin:  Negative for rash.  Allergic/Immunologic: Negative for environmental allergies.  Neurological:  Negative for dizziness and headaches.  Hematological:  Does not bruise/bleed easily.  Psychiatric/Behavioral:  Positive for depression. Negative for decreased concentration and suicidal ideas. The patient is not nervous/anxious and does not have insomnia.    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   Goals of care, counseling/discussion 01/26/2018   Small bowel tumor    Pancreatic cyst 01/15/2018   Anxiety and depression 01/15/2018   Pancreas cyst 01/15/2018   Bowel obstruction ([place]) 11/29/2017   Small bowel obstruction ([facility])    Thoracoabdominal aortic aneurysm (TAAA) ([place]) 11/19/2017   Status post total knee replacement using cement, right 05/30/2017   GERD (gastroesophageal reflux  disease) 12/22/2015   Adenomatous polyp 09/11/2014   Bilateral cataracts 09/11/2014   Gastric catarrh 09/11/2014   Gastroduodenal ulcer 09/11/2014   Hypothyroidism 09/11/2014   Cardiac murmur 10/08/2013   Accelerated hypertension 10/08/2013   Combined fat and carbohydrate induced hyperlipemia 10/08/2013    Allergies  Allergen Reactions   Adhesive [Tape] Dermatitis    Can not use plastic tape. Paper tape ok    Amlodipine Swelling    Throat  swelling   Asa [Aspirin]     Itchy    Penicillin G Itching    Has patient had a PCN reaction causing immediate rash, facial/tongue/throat swelling, SOB or lightheadedness with hypotension: No Has patient had a PCN reaction causing severe rash involving mucus membranes or skin necrosis: No Has patient had a PCN reaction that required hospitalization: No Has patient had a PCN reaction occurring within the last 10 years: No If all of the above answers are "NO", then may proceed with Cephalosporin use.    Hydrocodone-Acetaminophen Itching and Rash    Past Surgical History:  Procedure Laterality Date   BOWEL RESECTION  01/17/2018   Procedure: SMALL BOWEL RESECTION;  Surgeon: Jules Husbands, MD;  Location: [facility];  Service: General;;   BREAST SURGERY Bilateral 1991   mastectomy   CATARACT EXTRACTION Bilateral    CHOLECYSTECTOMY     COLONOSCOPY  2013   Dr Aura Camps AUSTIN Right 06/03/2015   Procedure: HALLUX VALGUS AUSTIN;  Surgeon: Samara Deist, DPM;  Location: [facility];  Service: Podiatry;  Laterality: Right;  WITH POPLITEAL   KNEE ARTHROSCOPY WITH MEDIAL MENISECTOMY Right 08/30/2016   Procedure: KNEE ARTHROSCOPY WITH PARTIAL MEDIAL MENISECTOMY and Partial Lateral Menisectomy;  Surgeon: Hessie Knows, MD;  Location: [facility];  Service: Orthopedics;  Laterality: Right;   KNEE ARTHROSCOPY WITH SUBCHONDROPLASTY Right 08/30/2016   Procedure: KNEE ARTHROSCOPY WITH SUBCHONDROPLASTY;  Surgeon: Hessie Knows, MD;  Location: [facility];  Service: Orthopedics;  Laterality: Right;   LAPAROTOMY N/A 01/17/2018   Procedure: EXPLORATORY LAPAROTOMY;  Surgeon: Jules Husbands, MD;  Location: [facility];  Service: General;  Laterality: N/A;   MASTECTOMY Bilateral    ROTATOR CUFF REPAIR Bilateral    TONSILLECTOMY     TOTAL KNEE ARTHROPLASTY Left    TOTAL KNEE ARTHROPLASTY Right 05/30/2017   Procedure: TOTAL KNEE ARTHROPLASTY;  Surgeon: Hessie Knows, MD;  Location: [facility];  Service:  Orthopedics;  Laterality: Right;   VAGINAL HYSTERECTOMY      Social History   Tobacco Use   Smoking status: Former    Packs/day: 0.25    Years: 10.00    Pack years: 2.50    Types: Cigarettes    Quit date: 1991    Years since quitting: 31.5   Smokeless tobacco: Never   Tobacco comments:    smoking cessation materials not required  Vaping Use   Vaping Use: Never used  Substance Use Topics   Alcohol use: No    Alcohol/week: 0.0 standard drinks   Drug use: No     Medication list has been reviewed and updated.  Current Meds  Medication Sig   EUTHYROX 50 MCG tablet Take 50 mcg by mouth daily.   nystatin cream (MYCOSTATIN) Apply 1 application topically 2 (two) times daily.   omeprazole (PRILOSEC) 20 MG capsule Take 1 capsule (20 mg total) by mouth daily.   potassium chloride (KLOR-CON) 10 MEQ tablet Take by mouth.    PHQ 2/9 Scores 09/22/2020 06/25/2020 01/06/2020 12/02/2019  PHQ - 2  Score 6 2 0 0  PHQ- 9 Score 12 10 1  -    GAD 7 : Generalized Anxiety Score 09/22/2020 06/25/2020 01/06/2020 09/05/2019  Nervous, Anxious, on Edge 2 1 0 0  Control/stop worrying 1 1 0 0  Worry too much - different things 1 1 0 0  Trouble relaxing 2 0 0 0  Restless 3 0 0 0  Easily annoyed or irritable 3 1 0 2  Afraid - awful might happen 1 0 0 0  Total GAD 7 Score 13 4 0 2  Anxiety Difficulty Very difficult - - Not difficult at all    BP Readings from Last 3 Encounters:  09/22/20 128/80  07/28/20 (!) 159/81  06/25/20 140/86    Physical Exam Vitals and nursing note reviewed.  Constitutional:      General: She is irritable. She is not in acute distress.    Appearance: She is not diaphoretic.  HENT:     Head: Normocephalic and atraumatic.     Right Ear: Tympanic membrane and external ear normal.     Left Ear: Tympanic membrane and external ear normal.     Nose: Nose normal. No congestion or rhinorrhea.  Eyes:     General:        Right eye: No discharge.        Left eye: No discharge.      Conjunctiva/sclera: Conjunctivae normal.     Pupils: Pupils are equal, round, and reactive to light.  Neck:     Thyroid: No thyromegaly.     Vascular: No JVD.  Cardiovascular:     Rate and Rhythm: Normal rate and regular rhythm.     Heart sounds: Normal heart sounds. No murmur heard.   No friction rub. No gallop.  Pulmonary:     Effort: Pulmonary effort is normal.     Breath sounds: Normal breath sounds. No wheezing or rhonchi.  Abdominal:     General: Bowel sounds are normal.     Palpations: Abdomen is soft. There is no mass.     Tenderness: There is no abdominal tenderness. There is no guarding.  Musculoskeletal:        General: Normal range of motion.     Cervical back: Normal range of motion and neck supple.  Lymphadenopathy:     Cervical: No cervical adenopathy.  Skin:    General: Skin is warm and dry.  Neurological:     Mental Status: She is alert.     Deep Tendon Reflexes: Reflexes are normal and symmetric.    Wt Readings from Last 3 Encounters:  09/22/20 194 lb (88 kg)  07/27/20 193 lb (87.5 kg)  06/25/20 194 lb (88 kg)    BP 128/80   Pulse 80   Ht 5\' 5"  (1.651 m)   Wt 194 lb (88 kg)   BMI 32.28 kg/m   Assessment and Plan:  1. Anxiety and depression Chronic.  Uncontrolled.  Stable.  Patient approaches today for resuming medical regimen for her recurrence of depression and anxiety.  Patient has a gad score of 13 with a PHQ score of 12.  We will resume sertraline beginning at 25 mg once a day for 4 weeks and patient's been given enough that she was to double up on the last week and we will recheck patient in 5 to 6 weeks. - sertraline (ZOLOFT) 25 MG tablet; Take 1 tablet (25 mg total) by mouth daily. After 4 weeks if tolerated ,increase to 2 tablets a day  Dispense: 60 tablet; Refill: 1  2. Yeast dermatitis Chronic.  Episodic.  Stable.  Refill nystatin cream to be applied twice a day as needed. - nystatin cream (MYCOSTATIN); Apply 1 application topically 2  (two) times daily.  Dispense: 30 g; Refill: 0

## 2020-09-25 DIAGNOSIS — E039 Hypothyroidism, unspecified: Secondary | ICD-10-CM | POA: Diagnosis not present

## 2020-09-29 DIAGNOSIS — R002 Palpitations: Secondary | ICD-10-CM | POA: Diagnosis not present

## 2020-09-29 DIAGNOSIS — E876 Hypokalemia: Secondary | ICD-10-CM | POA: Diagnosis not present

## 2020-09-29 DIAGNOSIS — I10 Essential (primary) hypertension: Secondary | ICD-10-CM | POA: Diagnosis not present

## 2020-09-29 DIAGNOSIS — E782 Mixed hyperlipidemia: Secondary | ICD-10-CM | POA: Diagnosis not present

## 2020-09-29 DIAGNOSIS — I480 Paroxysmal atrial fibrillation: Secondary | ICD-10-CM | POA: Diagnosis not present

## 2020-09-29 DIAGNOSIS — I25118 Atherosclerotic heart disease of native coronary artery with other forms of angina pectoris: Secondary | ICD-10-CM | POA: Diagnosis not present

## 2020-10-19 ENCOUNTER — Telehealth: Payer: Self-pay

## 2020-10-19 NOTE — Telephone Encounter (Signed)
Copied from [place] [phone]. Topic: General - Other >> Oct 19, 2020 10:30 AM Tessa Lerner A wrote: Reason for CRM: Patient shares that they have not taken their sertraline (ZOLOFT) 25 MG tablet yet but are feeling good without the medication currently  Patient has followed the advice of their PCP and began spending more time having fun and hanging out with their dog  Patient has cancelled their Med follow up on 10/22/20 and will be out of town going to [place], with their dog   Please contact further if needed

## 2020-10-22 ENCOUNTER — Ambulatory Visit: Payer: Self-pay | Admitting: Family Medicine

## 2020-11-19 ENCOUNTER — Emergency Department: Payer: Medicare HMO

## 2020-11-19 ENCOUNTER — Other Ambulatory Visit: Payer: Self-pay

## 2020-11-19 ENCOUNTER — Emergency Department
Admission: EM | Admit: 2020-11-19 | Discharge: 2020-11-19 | Disposition: A | Discharge status: Home / Self Care | Payer: Medicare HMO | Attending: Emergency Medicine | Admitting: Emergency Medicine

## 2020-11-19 ENCOUNTER — Encounter: Payer: Self-pay | Admitting: *Deleted

## 2020-11-19 DIAGNOSIS — Z853 Personal history of malignant neoplasm of breast: Secondary | ICD-10-CM | POA: Insufficient documentation

## 2020-11-19 DIAGNOSIS — R079 Chest pain, unspecified: Secondary | ICD-10-CM | POA: Diagnosis not present

## 2020-11-19 DIAGNOSIS — I10 Essential (primary) hypertension: Secondary | ICD-10-CM | POA: Diagnosis not present

## 2020-11-19 DIAGNOSIS — I499 Cardiac arrhythmia, unspecified: Secondary | ICD-10-CM | POA: Diagnosis not present

## 2020-11-19 DIAGNOSIS — R0789 Other chest pain: Secondary | ICD-10-CM | POA: Diagnosis not present

## 2020-11-19 DIAGNOSIS — R161 Splenomegaly, not elsewhere classified: Secondary | ICD-10-CM | POA: Insufficient documentation

## 2020-11-19 DIAGNOSIS — Z96653 Presence of artificial knee joint, bilateral: Secondary | ICD-10-CM | POA: Insufficient documentation

## 2020-11-19 DIAGNOSIS — Z743 Need for continuous supervision: Secondary | ICD-10-CM | POA: Diagnosis not present

## 2020-11-19 DIAGNOSIS — E876 Hypokalemia: Secondary | ICD-10-CM

## 2020-11-19 DIAGNOSIS — I2510 Atherosclerotic heart disease of native coronary artery without angina pectoris: Secondary | ICD-10-CM | POA: Insufficient documentation

## 2020-11-19 DIAGNOSIS — I4891 Unspecified atrial fibrillation: Secondary | ICD-10-CM | POA: Insufficient documentation

## 2020-11-19 DIAGNOSIS — E039 Hypothyroidism, unspecified: Secondary | ICD-10-CM | POA: Insufficient documentation

## 2020-11-19 DIAGNOSIS — Z79899 Other long term (current) drug therapy: Secondary | ICD-10-CM | POA: Diagnosis not present

## 2020-11-19 DIAGNOSIS — Z87891 Personal history of nicotine dependence: Secondary | ICD-10-CM | POA: Diagnosis not present

## 2020-11-19 DIAGNOSIS — K5730 Diverticulosis of large intestine without perforation or abscess without bleeding: Secondary | ICD-10-CM | POA: Diagnosis not present

## 2020-11-19 HISTORY — DX: Unspecified atrial fibrillation: I48.91

## 2020-11-19 LAB — CBC WITH DIFFERENTIAL/PLATELET
Abs Immature Granulocytes: 0.03 10*3/uL (ref 0.00–0.07)
Basophils Absolute: 0 10*3/uL (ref 0.0–0.1)
Basophils Relative: 1 %
Eosinophils Absolute: 0.1 10*3/uL (ref 0.0–0.5)
Eosinophils Relative: 2 %
HCT: 39.8 % (ref 36.0–46.0)
Hemoglobin: 14.1 g/dL (ref 12.0–15.0)
Immature Granulocytes: 1 %
Lymphocytes Relative: 41 %
Lymphs Abs: 2.1 10*3/uL (ref 0.7–4.0)
MCH: 31.8 pg (ref 26.0–34.0)
MCHC: 35.4 g/dL (ref 30.0–36.0)
MCV: 89.8 fL (ref 80.0–100.0)
Monocytes Absolute: 0.3 10*3/uL (ref 0.1–1.0)
Monocytes Relative: 7 %
Neutro Abs: 2.5 10*3/uL (ref 1.7–7.7)
Neutrophils Relative %: 48 %
Platelets: 142 10*3/uL — ABNORMAL LOW (ref 150–400)
RBC: 4.43 MIL/uL (ref 3.87–5.11)
RDW: 14.5 % (ref 11.5–15.5)
WBC: 5.1 10*3/uL (ref 4.0–10.5)
nRBC: 0 % (ref 0.0–0.2)

## 2020-11-19 LAB — BASIC METABOLIC PANEL
Anion gap: 10 (ref 5–15)
BUN: 14 mg/dL (ref 8–23)
CO2: 23 mmol/L (ref 22–32)
Calcium: 9.4 mg/dL (ref 8.9–10.3)
Chloride: 106 mmol/L (ref 98–111)
Creatinine, Ser: 0.78 mg/dL (ref 0.44–1.00)
GFR, Estimated: >60 mL/min (ref >60)
Glucose, Bld: 125 mg/dL — ABNORMAL HIGH (ref 70–99)
Potassium: 3.2 mmol/L — ABNORMAL LOW (ref 3.5–5.1)
Sodium: 139 mmol/L (ref 135–145)

## 2020-11-19 LAB — TSH: TSH: 6.222 u[IU]/mL — ABNORMAL HIGH (ref 0.350–4.500)

## 2020-11-19 LAB — T4, FREE: Free T4: 0.8 ng/dL (ref 0.61–1.12)

## 2020-11-19 LAB — MAGNESIUM: Magnesium: 1.8 mg/dL (ref 1.7–2.4)

## 2020-11-19 LAB — TROPONIN I (HIGH SENSITIVITY)
Troponin I (High Sensitivity): 32 ng/L — ABNORMAL HIGH (ref <18)
Troponin I (High Sensitivity): 90 ng/L — ABNORMAL HIGH (ref <18)

## 2020-11-19 MED ORDER — DILTIAZEM HCL-DEXTROSE 125-5 MG/125ML-% IV SOLN (PREMIX)
5.0000 mg/h | INTRAVENOUS | Status: DC
Start: 2020-11-19 — End: 2020-11-19

## 2020-11-19 MED ORDER — POTASSIUM CHLORIDE CRYS ER 20 MEQ PO TBCR
40.0000 meq | EXTENDED_RELEASE_TABLET | Freq: Once | ORAL | Status: AC
  Administered 2020-11-19: 40 meq via ORAL
  Filled 2020-11-19: qty 2

## 2020-11-19 MED ORDER — IOHEXOL 350 MG/ML SOLN
75.0000 mL | Freq: Once | INTRAVENOUS | Status: AC | PRN
  Administered 2020-11-19: 75 mL via INTRAVENOUS

## 2020-11-19 MED ORDER — DILTIAZEM LOAD VIA INFUSION: 10.0000 mg | Freq: Once | INTRAVENOUS | Status: DC

## 2020-11-19 NOTE — ED Provider Notes (Signed)
I think it is patient approximately 700.  Please have him for his note for full details guarding patient's initial evaluation assessment.  In brief patient presents with history of paroxysmal A. fib on as needed diltiazem for assessment of chest tightness started around 4 AM with severe palpitations.  She did take a dose of her diltiazem.  On arrival she is in A. fib with RVR with a rate of 123.  She spontaneously converted to sinus rhythm slight bradycardia.   Postconversion ECG shows sinus rhythm with a rate of 58 and right bundle branch block.    CBC shows no leukocytosis or acute anemia.  BMP shows K of 3.2 which was repleted without any other significant electrolyte or metabolic derangements.  Museum is WNL.  TSH is slightly elevated at 6.22   Troponin elevated 32.  Plan is to follow-up repeat troponin and CTA chest given patient also history of aortic aneurysm.  1. No CT findings for aortic dissection or aneurysm. Moderate atherosclerotic calcifications involving the thoracic and abdominal aorta and branch vessels. 2. Stable three-vessel coronary artery calcifications. 3. Stable advanced cirrhotic changes involving the liver but no worrisome arterial phase enhancing lesions to suggest hepatoma or dysplastic nodules. 4. Stable cystic lesions in the pancreatic head. Recommend continued surveillance. 5. Stable renal cortical scarring changes involving the right kidney. 6. Stable 15 mm enhancing lesion projecting off the upper pole region of the left kidney. Recommend follow-up pre and postcontrast MRI abdomen (preferred) for further evaluation. 7. Stable surgical changes involving the small bowel in the right abdomen. No findings for bowel obstruction or inflammation. 8. Aortic atherosclerosis.   Aortic Atherosclerosis (ICD10-I70.0).  Discussed incidental findings with patient and recommendation for close outpatient PCP follow-up to coordinate any additional f/u  imaging.    Troponin did uptrend to 90.  Seems patient has ranged between 58 and 105 over the last year.  Discussed this uptrend with on-call cardiologist Dr. Clayborn Bigness who agreed this was likely some mild demand from RVR earlier this morning and there medication given absence of any persistent chest pain or other concerning findings on ECG for additional trending or inpatient monitoring.  Discussed this with patient and recommend for close outpatient cardiology follow-up.  She is amenable with plan.  Discharged stable condition.  Strict return precautions advised discussed.   Lucrezia Starch, MD 11/19/20 [phone]

## 2020-11-19 NOTE — ED Provider Notes (Addendum)
[facility] Emergency Department Provider Note  ____________________________________________   Event Date/Time   First MD Initiated Contact with Patient 11/19/20 [phone]     (approximate)  I have reviewed the triage vital signs and the nursing notes.   HISTORY  Chief Complaint Chest Pain    HPI Kaitlyn Oliver is a 87 y.o. female with history of paroxysmal atrial fibrillation, thoracic aortic aneurysm, hypertension, hypothyroidism, previous breast cancer who presents to the emergency department with diffuse chest tightness without radiation that started while at rest at 4 AM.  States it woke her from sleep.  No associated shortness of breath, nausea, vomiting, dizziness, diaphoresis.  EMS reports that she was in atrial fibrillation with RVR on their arrival with a rate between the 150s to 190s.  They gave 2.5 mg of IV metoprolol.  Patient did take 4 baby aspirin at home.  States pain has improved but not resolved.  Heart rate currently in the 110's to 140s.  She has not on any anticoagulation.  She has a prescription for diltiazem 60 mg to take as needed for palpitations.  States she did take this medication before calling EMS.  She does not take this medication regularly as she has had multiple intolerances to medications.        Past Medical History:  Diagnosis Date   Anxiety    Arthritis    thumbs, hands   Atrial fibrillation ([place])    Cancer ([place]) 1991   bilateral breast   Depression    GERD (gastroesophageal reflux disease)    Headache    occasional - pinched nerve in neck   Hypertension    Hypothyroidism    Thyroid disease    Wears dentures    full upper, partial lower    Patient Active Problem List   Diagnosis Date Noted   Hypomagnesemia 07/28/2020   Headache 07/28/2020   AF (paroxysmal atrial fibrillation) ([place]) 07/28/2020   Other cirrhosis of liver ([place])    Chest pain 01/01/2020   Atrial fibrillation, new onset ([place]) 01/01/2020    Elevated troponin 01/01/2020   CAD (coronary artery disease) 01/01/2020   Hypokalemia 08/20/2018   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   Goals of care, counseling/discussion 01/26/2018   Small bowel tumor    Pancreatic cyst 01/15/2018   Anxiety and depression 01/15/2018   Pancreas cyst 01/15/2018   Bowel obstruction ([place]) 11/29/2017   Small bowel obstruction ([facility])    Thoracoabdominal aortic aneurysm (TAAA) ([place]) 11/19/2017   Status post total knee replacement using cement, right 05/30/2017   GERD (gastroesophageal reflux disease) 12/22/2015   Adenomatous polyp 09/11/2014   Bilateral cataracts 09/11/2014   Gastric catarrh 09/11/2014   Gastroduodenal ulcer 09/11/2014   Hypothyroidism 09/11/2014   Cardiac murmur 10/08/2013   Accelerated hypertension 10/08/2013   Combined fat and carbohydrate induced hyperlipemia 10/08/2013    Past Surgical History:  Procedure Laterality Date   BOWEL RESECTION  01/17/2018   Procedure: SMALL BOWEL RESECTION;  Surgeon: Jules Husbands, MD;  Location: [facility];  Service: General;;   BREAST SURGERY Bilateral 1991   mastectomy   CATARACT EXTRACTION Bilateral    CHOLECYSTECTOMY     COLONOSCOPY  2013   Dr Katina Dung VALGUS AUSTIN Right 06/03/2015   Procedure: HALLUX VALGUS AUSTIN;  Surgeon: Samara Deist, DPM;  Location: [facility];  Service: Podiatry;  Laterality: Right;  WITH POPLITEAL   KNEE ARTHROSCOPY WITH MEDIAL MENISECTOMY Right 08/30/2016   Procedure: KNEE ARTHROSCOPY  WITH PARTIAL MEDIAL MENISECTOMY and Partial Lateral Menisectomy;  Surgeon: Hessie Knows, MD;  Location: [facility];  Service: Orthopedics;  Laterality: Right;   KNEE ARTHROSCOPY WITH SUBCHONDROPLASTY Right 08/30/2016   Procedure: KNEE ARTHROSCOPY WITH SUBCHONDROPLASTY;  Surgeon: Hessie Knows, MD;  Location: [facility];  Service: Orthopedics;  Laterality: Right;   LAPAROTOMY N/A 01/17/2018   Procedure: EXPLORATORY LAPAROTOMY;  Surgeon: Jules Husbands,  MD;  Location: [facility];  Service: General;  Laterality: N/A;   MASTECTOMY Bilateral    ROTATOR CUFF REPAIR Bilateral    TONSILLECTOMY     TOTAL KNEE ARTHROPLASTY Left    TOTAL KNEE ARTHROPLASTY Right 05/30/2017   Procedure: TOTAL KNEE ARTHROPLASTY;  Surgeon: Hessie Knows, MD;  Location: [facility];  Service: Orthopedics;  Laterality: Right;   VAGINAL HYSTERECTOMY      Prior to Admission medications   Medication Sig Start Date End Date Taking? Authorizing Provider  EUTHYROX 50 MCG tablet Take 50 mcg by mouth daily. 10/29/19   [provider]  nystatin cream (MYCOSTATIN) Apply 1 application topically 2 (two) times daily. 09/22/20   Juline Patch, MD  omeprazole (PRILOSEC) 20 MG capsule Take 1 capsule (20 mg total) by mouth daily. 07/04/19   Juline Patch, MD  potassium chloride (KLOR-CON) 10 MEQ tablet Take by mouth. 06/11/20 06/11/21  [provider]  sertraline (ZOLOFT) 25 MG tablet Take 1 tablet (25 mg total) by mouth daily. After 4 weeks if tolerated ,increase to 2 tablets a day 09/22/20   Juline Patch, MD    Allergies Adhesive [tape], Amlodipine, Asa [aspirin], Penicillin g, and Hydrocodone-acetaminophen  Family History  Problem Relation Age of Onset   Pulmonary embolism Mother    Healthy Father     Social History Social History   Tobacco Use   Smoking status: Former    Packs/day: 0.25    Years: 10.00    Pack years: 2.50    Types: Cigarettes    Quit date: 1991    Years since quitting: 31.7   Smokeless tobacco: Never   Tobacco comments:    smoking cessation materials not required  Vaping Use   Vaping Use: Never used  Substance Use Topics   Alcohol use: No    Alcohol/week: 0.0 standard drinks   Drug use: No    Review of Systems Constitutional: No fever. Eyes: No visual changes. ENT: No sore throat. Cardiovascular: + chest pain. Respiratory: Denies shortness of breath. Gastrointestinal: No nausea, vomiting, diarrhea. Genitourinary: Negative  for dysuria. Musculoskeletal: Negative for back pain. Skin: Negative for rash. Neurological: Negative for focal weakness or numbness.  ____________________________________________   PHYSICAL EXAM:  VITAL SIGNS: ED Triage Vitals  Enc Vitals Group     BP 11/19/20 0532 (!) 153/104     Pulse Rate 11/19/20 0532 (!) 111     Resp 11/19/20 0532 16     Temp 11/19/20 0532 98.3 F (36.8 C)     Temp Source 11/19/20 0532 Oral     SpO2 11/19/20 0532 97 %     Weight --      Height --      Head Circumference --      Peak Flow --      Pain Score 11/19/20 0526 4     Pain Loc --      Pain Edu? --      Excl. in [place]? --    CONSTITUTIONAL: Alert and oriented and responds appropriately to questions. Well-appearing; well-nourished, elderly, no distress HEAD: Normocephalic EYES:  Conjunctivae clear, pupils appear equal, EOM appear intact ENT: normal nose; moist mucous membranes NECK: Supple, normal ROM CARD: Irregularly irregular and tachycardic; S1 and S2 appreciated; no murmurs, no clicks, no rubs, no gallops RESP: Normal chest excursion without splinting or tachypnea; breath sounds clear and equal bilaterally; no wheezes, no rhonchi, no rales, no hypoxia or respiratory distress, speaking full sentences ABD/GI: Normal bowel sounds; non-distended; soft, non-tender, no rebound, no guarding, no peritoneal signs, no hepatosplenomegaly BACK: The back appears normal EXT: Normal ROM in all joints; no deformity noted, no edema; no cyanosis, no calf tenderness or calf swelling SKIN: Normal color for age and race; warm; no rash on exposed skin NEURO: Moves all extremities equally PSYCH: The patient's mood and manner are appropriate.  ____________________________________________   LABS (all labs ordered are listed, but only abnormal results are displayed)  Labs Reviewed  CBC WITH DIFFERENTIAL/PLATELET - Abnormal; Notable for the following components:      Result Value   Platelets 142 (*)    All other  components within normal limits  BASIC METABOLIC PANEL - Abnormal; Notable for the following components:   Potassium 3.2 (*)    Glucose, Bld 125 (*)    All other components within normal limits  TSH - Abnormal; Notable for the following components:   TSH 6.222 (*)    All other components within normal limits  TROPONIN I (HIGH SENSITIVITY) - Abnormal; Notable for the following components:   Troponin I (High Sensitivity) 32 (*)    All other components within normal limits  MAGNESIUM  T4, FREE  TROPONIN I (HIGH SENSITIVITY)   ____________________________________________  EKG   EKG Interpretation  Date/Time:  Thursday November 19 2020 05:34:00 EDT Ventricular Rate:  58 PR Interval:  144 QRS Duration: 140 QT Interval:  424 QTC Calculation: 417 R Axis:   4 Text Interpretation: Sinus rhythm Probable left atrial enlargement Right bundle branch block Confirmed by Gabrial Poppell, Cyril Mourning [phone]) on 11/19/2020 6:44:11 AM        EKG Interpretation  Date/Time:  Thursday November 19 2020 05:34:00 EDT Ventricular Rate:  58 PR Interval:  144 QRS Duration: 140 QT Interval:  424 QTC Calculation: 417 R Axis:   4 Text Interpretation: Sinus rhythm Probable left atrial enlargement Right bundle branch block Confirmed by Pryor Curia [phone]) on 11/19/2020 6:44:11 AM        ____________________________________________  RADIOLOGY Jessie Foot Brandace Cargle, personally viewed and evaluated these images (plain radiographs) as part of my medical decision making, as well as reviewing the written report by the radiologist.  ED MD interpretation:    Official radiology report(s): No results found.  ____________________________________________   PROCEDURES  Procedure(s) performed (including Critical Care):  Procedures  CRITICAL CARE Performed by: Pryor Curia   Total critical care time: 45 minutes  Critical care time was exclusive of separately billable procedures and treating other  patients.  Critical care was necessary to treat or prevent imminent or life-threatening deterioration.  Critical care was time spent personally by me on the following activities: development of treatment plan with patient and/or surrogate as well as nursing, discussions with consultants, evaluation of patient's response to treatment, examination of patient, obtaining history from patient or surrogate, ordering and performing treatments and interventions, ordering and review of laboratory studies, ordering and review of radiographic studies, pulse oximetry and re-evaluation of patient's condition.  ____________________________________________   INITIAL IMPRESSION / ASSESSMENT AND PLAN / ED COURSE  As part of my medical decision making, I reviewed the following data  within the [facility] notes reviewed and incorporated, Labs reviewed , EKG interpreted , Old EKG reviewed, Old chart reviewed, Patient signed out to oncoming ED physician, and Notes from prior ED visits         Patient here with A. fib with RVR.  Received metoprolol with EMS and now converted to a sinus bradycardia.  Still having some chest discomfort however.  She refuses nitroglycerin given she has had hypotension with this before.  She reports multiple medication intolerances and is very hesitant to start any new medications.  It does appear per cardiology note she was provided with a prescription for Cardizem to take as needed if palpitations lasted longer than 30 minutes.  She did take a full dose of this medication tonight prior to calling EMS.  She does not take Cardizem regularly.  She does have also have a history of thoracic aortic aneurysm.  Worsening aneurysm, dissection on the differential.  Will obtain cardiac labs, CTA of the chest, abdomen and pelvis.  It appears per cardiology notes, they thought that her paroxysmal A. fib may be secondary to hypokalemia.  She takes potassium chronically.  We will  check electrolytes today.  She also has history of hypothyroidism.  Will check thyroid function test.  Her CHA2DS2-VASc 2 score is 4.  Per cardiology notes, given atrial fibrillation was thought secondary to electrolyte derangement and due to patient's hesitancy, patient was not started on anticoagulation.  Patient reports today that she does not want to be on anticoagulants.  ED PROGRESS  Patient's labs show potassium of 3.2.  Will give oral replacement.  Magnesium is 1.8.  Her first troponin minimally elevated at 32.  TSH elevated.  We will add on free T4.  7:09 AM  Repeat troponin, CTA of the chest/abdomen/pelvis pending.  Signed out to oncoming ED physician to follow-up on these test results and dispo accordingly.  Patient reports at this time her pain is improving.   I reviewed all nursing notes and pertinent previous records as available.  I have reviewed and interpreted any EKGs, lab and urine results, imaging (as available).  ____________________________________________   FINAL CLINICAL IMPRESSION(S) / ED DIAGNOSES  Final diagnoses:  Atrial fibrillation with RVR ([place])  Chest pain, unspecified type  Hypokalemia     ED Discharge Orders     None       *Please note:  Kaitlyn Oliver was evaluated in Emergency Department on 11/19/2020 for the symptoms described in the history of present illness. She was evaluated in the context of the global COVID-19 pandemic, which necessitated consideration that the patient might be at risk for infection with the SARS-CoV-2 virus that causes COVID-19. Institutional protocols and algorithms that pertain to the evaluation of patients at risk for COVID-19 are in a state of rapid change based on information released by regulatory bodies including the CDC and federal and state organizations. These policies and algorithms were followed during the patient's care in the ED.  Some ED evaluations and interventions may be delayed as a result of limited  staffing during and the pandemic.*   Note:  This document was prepared using Dragon voice recognition software and may include unintentional dictation errors.    Korina Tretter, Delice Bison, DO 11/19/20 [place], Delice Bison, DO 11/19/20 [phone]

## 2020-11-19 NOTE — ED Notes (Signed)
Pt at CT

## 2020-11-19 NOTE — ED Triage Notes (Signed)
Pt arrives from home via [facility]. Per medic report, Pt had onset of chest pain suddenly this morning. She took  324 asa PTA. On EMS arriveal she was afib rvr 150-190, BP  180/100. She was given  2.5 metoprolol IV, with HR reduced to  100-120 BPM, afib. Cp subsided. En route, temp 98.0, cbg 128. Iv established in the left FA.

## 2020-11-19 NOTE — ED Notes (Signed)
Pt resting comfortably at this time. PO K+ given, pt had issues with successfully swallowing whole pill, other pill was dissolved in water to prevent choking. Pt and husband given warm blanket. Pt denies further needs. NAD noted. Call bell in reach.

## 2020-11-19 NOTE — ED Notes (Signed)
D/C and reasons to return to ED discussed with pt, pt verbalized understanding. NAD noted on D/C.

## 2020-11-19 NOTE — Discharge Instructions (Addendum)
Your CT Today showed: 1. No CT findings for aortic dissection or aneurysm. Moderate  atherosclerotic calcifications involving the thoracic and abdominal  aorta and branch vessels.  2. Stable three-vessel coronary artery calcifications.  3. Stable advanced cirrhotic changes involving the liver but no  worrisome arterial phase enhancing lesions to suggest hepatoma or  dysplastic nodules.  4. Stable cystic lesions in the pancreatic head. Recommend continued  surveillance.  5. Stable renal cortical scarring changes involving the right  kidney.  6. Stable 15 mm enhancing lesion projecting off the upper pole  region of the left kidney. Recommend follow-up pre and postcontrast  MRI abdomen (preferred) for further evaluation.  7. Stable surgical changes involving the small bowel in the right  abdomen. No findings for bowel obstruction or inflammation.  8. Aortic atherosclerosis.  Aortic Atherosclerosis (ICD10-I70.0).

## 2020-11-24 DIAGNOSIS — I480 Paroxysmal atrial fibrillation: Secondary | ICD-10-CM | POA: Diagnosis not present

## 2020-12-02 ENCOUNTER — Ambulatory Visit: Payer: Medicare HMO

## 2020-12-07 ENCOUNTER — Ambulatory Visit (INDEPENDENT_AMBULATORY_CARE_PROVIDER_SITE_OTHER): Payer: Medicare HMO

## 2020-12-07 DIAGNOSIS — Z0000 Encounter for general adult medical examination without abnormal findings: Secondary | ICD-10-CM | POA: Diagnosis not present

## 2020-12-07 NOTE — Progress Notes (Signed)
Subjective:   Kaitlyn Oliver is a 87 y.o. female who presents for Medicare Annual (Subsequent) preventive examination.  Virtual Visit via Telephone Note  I connected with  [name] on 12/07/20 at  8:00 AM EDT by telephone and verified that I am speaking with the correct person using two identifiers.  Location: Patient: home Provider: [facility] Persons participating in the virtual visit: [place]   I discussed the limitations, risks, security and privacy concerns of performing an evaluation and management service by telephone and the availability of in person appointments. The patient expressed understanding and agreed to proceed.  Interactive audio and video telecommunications were attempted between this nurse and patient, however failed, due to patient having technical difficulties OR patient did not have access to video capability.  We continued and completed visit with audio only.  Some vital signs may be absent or patient reported.   Clemetine Marker, LPN   Review of Systems     Cardiac Risk Factors include: advanced age (>42mn, >>49women);dyslipidemia;hypertension;obesity (BMI >30kg/m2)     Objective:    There were no vitals filed for this visit. There is no height or weight on file to calculate BMI.  Advanced Directives 12/07/2020 11/19/2020 07/27/2020 01/01/2020 11/22/2019 05/27/2019 03/06/2019  Does Patient Have a Medical Advance Directive? Yes No Yes No Yes No No  Type of AParamedicof ADar[place]Living will - HPoll[place]Living will - H[place]umb[place]Living will HRed BankLiving will -  Does patient want to make changes to medical advance directive? - - - - - No - Patient declined -  Copy of [place] Chart? No - copy requested - - - No - copy requested No - copy requested -  Would patient like information on creating a medical advance directive? - Yes (ED  - Information included in AVS) - No - Patient declined No - Patient declined - No - Patient declined    Current Medications (verified) Outpatient Encounter Medications as of 12/07/2020  Medication Sig   EUTHYROX 50 MCG tablet Take 50 mcg by mouth daily.   nystatin cream (MYCOSTATIN) Apply 1 application topically 2 (two) times daily.   omeprazole (PRILOSEC) 20 MG capsule Take 1 capsule (20 mg total) by mouth daily.   potassium chloride (KLOR-CON) 10 MEQ tablet Take by mouth.   [DISCONTINUED] sertraline (ZOLOFT) 25 MG tablet Take 1 tablet (25 mg total) by mouth daily. After 4 weeks if tolerated ,increase to 2 tablets a day   No facility-administered encounter medications on file as of 12/07/2020.    Allergies (verified) Valsartan, Adhesive [tape], Amlodipine, Asa [aspirin], Hydralazine, Hydrochlorothiazide, Penicillin g, and Hydrocodone-acetaminophen   History: Past Medical History:  Diagnosis Date   Anxiety    Arthritis    thumbs, hands   Atrial fibrillation (HCC)    Cancer ([place]aucoma 1991   bilateral breast   Depression    GERD (gastroesophageal reflux disease)    Headache    occasional - pinched nerve in neck   Hypertension    Hypothyroidism    Thyroid disease    Wears dentures    full upper, partial lower   Past Surgical History:  Procedure Laterality Date   BOWEL RESECTION  01/17/2018   Procedure: SMALL BOWEL RESECTION;  Surgeon: PJules Husbands MD;  Location: [facility];  Service: General;;   BREAST SURGERY Bilateral 1991   mastectomy   CATARACT EXTRACTION Bilateral    CHOLECYSTECTOMY  COLONOSCOPY  2013   Dr Katina Dung VALGUS AUSTIN Right 06/03/2015   Procedure: HALLUX VALGUS AUSTIN;  Surgeon: Samara Deist, DPM;  Location: [facility];  Service: Podiatry;  Laterality: Right;  WITH POPLITEAL   KNEE ARTHROSCOPY WITH MEDIAL MENISECTOMY Right 08/30/2016   Procedure: KNEE ARTHROSCOPY WITH PARTIAL MEDIAL MENISECTOMY and Partial Lateral Menisectomy;  Surgeon:  Hessie Knows, MD;  Location: [facility];  Service: Orthopedics;  Laterality: Right;   KNEE ARTHROSCOPY WITH SUBCHONDROPLASTY Right 08/30/2016   Procedure: KNEE ARTHROSCOPY WITH SUBCHONDROPLASTY;  Surgeon: Hessie Knows, MD;  Location: [facility];  Service: Orthopedics;  Laterality: Right;   LAPAROTOMY N/A 01/17/2018   Procedure: EXPLORATORY LAPAROTOMY;  Surgeon: Jules Husbands, MD;  Location: [facility];  Service: General;  Laterality: N/A;   MASTECTOMY Bilateral    ROTATOR CUFF REPAIR Bilateral    TONSILLECTOMY     TOTAL KNEE ARTHROPLASTY Left    TOTAL KNEE ARTHROPLASTY Right 05/30/2017   Procedure: TOTAL KNEE ARTHROPLASTY;  Surgeon: Hessie Knows, MD;  Location: [facility];  Service: Orthopedics;  Laterality: Right;   VAGINAL HYSTERECTOMY     Family History  Problem Relation Age of Onset   Pulmonary embolism Mother    Healthy Father    Social History   Socioeconomic History   Marital status: Widowed    Spouse name: Not on file   Number of children: 1   Years of education: Not on file   Highest education level: 12th grade  Occupational History   Occupation: Retired  Tobacco Use   Smoking status: Former    Packs/day: 0.25    Years: 10.00    Pack years: 2.50    Types: Cigarettes    Quit date: 1991    Years since quitting: 31.7   Smokeless tobacco: Never   Tobacco comments:    smoking cessation materials not required  Vaping Use   Vaping Use: Never used  Substance and Sexual Activity   Alcohol use: No    Alcohol/week: 0.0 standard drinks   Drug use: No   Sexual activity: Not Currently  Other Topics Concern   Not on file  Social History Narrative   Not on file   Social Determinants of Health   Financial Resource Strain: Low Risk    Difficulty of Paying Living Expenses: Not hard at all  Food Insecurity: No Food Insecurity   Worried About Charity fundraiser in the Last Year: Never true   [place] in the Last Year: Never true  Transportation Needs: No Transportation  Needs   Lack of Transportation (Medical): No   Lack of Transportation (Non-Medical): No  Physical Activity: Inactive   Days of Exercise per Week: 0 days   Minutes of Exercise per Session: 0 min  Stress: No Stress Concern Present   Feeling of Stress : Not at all  Social Connections: Moderately Isolated   Frequency of Communication with Friends and Family: More than three times a week   Frequency of Social Gatherings with Friends and Family: Three times a week   Attends Religious Services: More than 4 times per year   Active Member of Clubs or Organizations: No   Attends Archivist Meetings: Never   Marital Status: Widowed    Tobacco Counseling Counseling given: Not Answered Tobacco comments: smoking cessation materials not required   Clinical Intake:  Pre-visit preparation completed: Yes  Pain : No/denies pain     Nutritional Risks: None Diabetes: No  How often do  you need to have someone help you when you read instructions, pamphlets, or other written materials from your doctor or pharmacy?: 1 - Never  Interpreter Needed?: No  Information entered by :: Clemetine Marker LPN   Activities of Daily Living In your present state of health, do you have any difficulty performing the following activities: 12/07/2020 01/01/2020  Hearing? N N  Vision? N N  Difficulty concentrating or making decisions? N N  Walking or climbing stairs? N N  Dressing or bathing? N N  Doing errands, shopping? N N  Preparing Food and eating ? N -  Using the Toilet? N -  In the past six months, have you accidently leaked urine? Y -  Managing your Medications? N -  Managing your Finances? N -  Housekeeping or managing your Housekeeping? N -  Some recent data might be hidden    Patient Care Team: Juline Patch, MD as PCP - General (Family Medicine) Ok Edwards, NP as Nurse Practitioner (Gastroenterology) Clabe Seal, PA-C Lonia Farber, MD as Consulting Physician  (Internal Medicine)  Indicate any recent Medical Services you may have received from other than [facility] providers in the past year (date may be approximate).     Assessment:   This is a routine wellness examination for Kaylin.  Hearing/Vision screen Hearing Screening - Comments:: Pt denies hearing difficulty Vision Screening - Comments:: Annual vision screenings done by Dr. Atilano Median  Dietary issues and exercise activities discussed: Current Exercise Habits: The patient does not participate in regular exercise at present, Exercise limited by: cardiac condition(s)   Goals Addressed             This Visit's Progress    DIET - INCREASE WATER INTAKE   On track    Recommend to drink at least 6-8 8oz glasses of water per day.     Lifestyle Change-Hypertension   On track    Timeframe:  Long-Range Goal Priority:  High Start Date:                             Expected End Date:                       Follow Up Date 08/04/20    - agree to work together to make changes - learn about high blood pressure     Why is this important?   The changes that you are asked to make may be hard to do.  This is especially true when the changes are life-long.  Knowing why it is important to you is the first step.  Working on the change with your family or support person helps you not feel alone.  Reward yourself and family or support person when goals are met. This can be an activity you choose like bowling, hiking, biking, swimming or shooting hoops.     Notes:      Manage My Medicine   On track    Timeframe:  Long-Range Goal Priority:  Medium Start Date:                             Expected End Date:                       Follow Up Date 08/04/20    - call for medicine refill 2 or 3 days before it  runs out - call if I am sick and can't take my medicine - keep a list of all the medicines I take; vitamins and herbals too - use a pillbox to sort medicine    Why is this important?   These steps  will help you keep on track with your medicines.   Notes: Patient plans to transfer all medications to [facility]. Requesting refill levothyroxine--not needed per chart should have 1 year supply at [facility]. Patient informed of transfer process and voiced understanding.     Track and Manage My Blood Pressure-Hypertension   On track    Timeframe:  Long-Range Goal Priority:  High Start Date:                             Expected End Date:                       Follow Up Date 09/15/20   - check blood pressure daily - write blood pressure results in a log or diary    Why is this important?   You won't feel high blood pressure, but it can still hurt your blood vessels.  High blood pressure can cause heart or kidney problems. It can also cause a stroke.  Making lifestyle changes like losing a little weight or eating less salt will help.  Checking your blood pressure at home and at different times of the day can help to control blood pressure.  If the doctor prescribes medicine remember to take it the way the doctor ordered.  Call the office if you cannot afford the medicine or if there are questions about it.     Notes:        Depression Screen PHQ 2/9 Scores 12/07/2020 09/22/2020 06/25/2020 01/06/2020 12/02/2019 09/05/2019 09/03/2019  PHQ - 2 Score 0 6 2 0 0 1 0  PHQ- 9 Score - _0 - 1 5    Fall Risk Fall Risk  12/07/2020 06/25/2020 01/06/2020 12/02/2019 09/05/2019  Falls in the past year? 0 0 0 0 0  Number falls in past yr: 0 - - 0 -  Injury with Fall? 0 - - 0 -  Risk for fall due to : No Fall Risks - - No Fall Risks No Fall Risks  Follow up Falls prevention discussed Falls evaluation completed Falls evaluation completed Falls prevention discussed Falls evaluation completed    [place]:  Any stairs in or around the home? Yes  If so, are there any without handrails? No  Home free of loose throw rugs in walkways, pet beds, electrical cords, etc? Yes   Adequate lighting in your home to reduce risk of falls? Yes   ASSISTIVE DEVICES UTILIZED TO PREVENT FALLS:  Life alert? No  Use of a cane, walker or w/c? No  Grab bars in the bathroom? Yes  Shower chair or bench in shower? Yes  Elevated toilet seat or a handicapped toilet? Yes   TIMED UP AND GO:  Was the test performed? No . Telephonic visit.   Cognitive Function: Normal cognitive status assessed by direct observation by this Nurse Health Advisor. No abnormalities found.       6CIT Screen 12/02/2019 04/06/2017  What Year? 0 points 0 points  What month? 0 points 0 points  What time? 0 points 0 points  Count back from 20 0 points 0 points  Months in reverse  0 points 0 points  Repeat phrase 0 points 0 points  Total Score 0 0    Immunizations Immunization History  Administered Date(s) Administered   Fluad Quad(high Dose 65+) 12/25/2019   Influenza, High Dose Seasonal PF 11/22/2018   Influenza,inj,Quad PF,6+ Mos 11/07/2017   Influenza-Unspecified 12/08/2015, 12/12/2016, 11/22/2018   PFIZER(Purple Top)SARS-COV-2 Vaccination 04/04/2019, 04/25/2019   Pneumococcal Conjugate-13 04/06/2017   Pneumococcal Polysaccharide-23 12/03/2018   Zoster Recombinat (Shingrix) 07/26/2017, 10/02/2017    TDAP status: Due, Education has been provided regarding the importance of this vaccine. Advised may receive this vaccine at local pharmacy or Health Dept. Aware to provide a copy of the vaccination record if obtained from local pharmacy or Health Dept. Verbalized acceptance and understanding.  Flu Vaccine status: Due, Education has been provided regarding the importance of this vaccine. Advised may receive this vaccine at local pharmacy or Health Dept. Aware to provide a copy of the vaccination record if obtained from local pharmacy or Health Dept. Verbalized acceptance and understanding.  Pneumococcal vaccine status: Completed during today's visit.  Covid-19 vaccine status: Completed  vaccines  Qualifies for Shingles Vaccine? Yes   Zostavax completed Yes   Shingrix Completed?: Yes  Screening Tests Health Maintenance  Topic Date Due   INFLUENZA VACCINE  10/12/2020   Zoster Vaccines- Shingrix  Completed   HPV VACCINES  Aged Out   DEXA SCAN  Discontinued   TETANUS/TDAP  Discontinued   COVID-19 Vaccine  Discontinued    Health Maintenance  Health Maintenance Due  Topic Date Due   INFLUENZA VACCINE  10/12/2020    Colorectal cancer screening: No longer required.   Mammogram status: No longer required due to bilateral mastectomy.  Bone density status: no longer required due to age  Lung Cancer Screening: (Low Dose CT Chest recommended if Age 66-80 years, 30 pack-year currently smoking OR have quit w/in 15years.) does not qualify.  Additional Screening:  Hepatitis C Screening: does not qualify.  Vision Screening: Recommended annual ophthalmology exams for early detection of glaucoma and other disorders of the eye. Is the patient up to date with their annual eye exam?  Yes  Who is the provider or what is the name of the office in which the patient attends annual eye exams? Dr. Atilano Median.   Dental Screening: Recommended annual dental exams for proper oral hygiene  Community Resource Referral / Chronic Care Management: CRR required this visit?  No   CCM required this visit?  No      Plan:     I have personally reviewed and noted the following in the patient's chart:   Medical and social history Use of alcohol, tobacco or illicit drugs  Current medications and supplements including opioid prescriptions.  Functional ability and status Nutritional status Physical activity Advanced directives List of other physicians Hospitalizations, surgeries, and ER visits in previous 12 months Vitals Screenings to include cognitive, depression, and falls Referrals and appointments  In addition, I have reviewed and discussed with patient certain preventive  protocols, quality metrics, and best practice recommendations. A written personalized care plan for preventive services as well as general preventive health recommendations were provided to patient.     Clemetine Marker, LPN   09/04/7626   Nurse Notes: none

## 2020-12-07 NOTE — Patient Instructions (Signed)
Kaitlyn Oliver , Thank you for taking time to come for your Medicare Wellness Visit. I appreciate your ongoing commitment to your health goals. Please review the following plan we discussed and let me know if I can assist you in the future.   Screening recommendations/referrals: Colonoscopy: no longer required Bone Density:  no longer required Recommended yearly ophthalmology/optometry visit for glaucoma screening and checkup Recommended yearly dental visit for hygiene and checkup  Vaccinations: Influenza vaccine: due Pneumococcal vaccine: done 12/03/18 Tdap vaccine: due Shingles vaccine: done 07/26/17 & 10/01/17   Covid-19:done 04/04/19 & 04/25/19  Advanced directives: Please bring a copy of your health care power of attorney and living will to the office at your convenience.   Conditions/risks identified: Keep up the great work!  Next appointment: Follow up in one year for your annual wellness visit    Preventive Care 65 Years and Older, Female Preventive care refers to lifestyle choices and visits with your health care provider that can promote health and wellness. What does preventive care include? A yearly physical exam. This is also called an annual well check. Dental exams once or twice a year. Routine eye exams. Ask your health care provider how often you should have your eyes checked. Personal lifestyle choices, including: Daily care of your teeth and gums. Regular physical activity. Eating a healthy diet. Avoiding tobacco and drug use. Limiting alcohol use. Practicing safe sex. Taking low-dose aspirin every day. Taking vitamin and mineral supplements as recommended by your health care provider. What happens during an annual well check? The services and screenings done by your health care provider during your annual well check will depend on your age, overall health, lifestyle risk factors, and family history of disease. Counseling  Your health care provider may ask you  questions about your: Alcohol use. Tobacco use. Drug use. Emotional well-being. Home and relationship well-being. Sexual activity. Eating habits. History of falls. Memory and ability to understand (cognition). Work and work Statistician. Reproductive health. Screening  You may have the following tests or measurements: Height, weight, and BMI. Blood pressure. Lipid and cholesterol levels. These may be checked every 5 years, or more frequently if you are over 59 years old. Skin check. Lung cancer screening. You may have this screening every year starting at age 79 if you have a 30-pack-year history of smoking and currently smoke or have quit within the past 15 years. Fecal occult blood test (FOBT) of the stool. You may have this test every year starting at age 71. Flexible sigmoidoscopy or colonoscopy. You may have a sigmoidoscopy every 5 years or a colonoscopy every 10 years starting at age 49. Hepatitis C blood test. Hepatitis B blood test. Sexually transmitted disease (STD) testing. Diabetes screening. This is done by checking your blood sugar (glucose) after you have not eaten for a while (fasting). You may have this done every 1-3 years. Bone density scan. This is done to screen for osteoporosis. You may have this done starting at age 61. Mammogram. This may be done every 1-2 years. Talk to your health care provider about how often you should have regular mammograms. Talk with your health care provider about your test results, treatment options, and if necessary, the need for more tests. Vaccines  Your health care provider may recommend certain vaccines, such as: Influenza vaccine. This is recommended every year. Tetanus, diphtheria, and acellular pertussis (Tdap, Td) vaccine. You may need a Td booster every 10 years. Zoster vaccine. You may need this after age 75. Pneumococcal  13-valent conjugate (PCV13) vaccine. One dose is recommended after age 69. Pneumococcal polysaccharide  (PPSV23) vaccine. One dose is recommended after age 14. Talk to your health care provider about which screenings and vaccines you need and how often you need them. This information is not intended to replace advice given to you by your health care provider. Make sure you discuss any questions you have with your health care provider. Document Released: 03/27/2015 Document Revised: 11/18/2015 Document Reviewed: 12/30/2014 Elsevier Interactive Patient Education  2017 [place] Prevention in the Home Falls can cause injuries. They can happen to people of all ages. There are many things you can do to make your home safe and to help prevent falls. What can I do on the outside of my home? Regularly fix the edges of walkways and driveways and fix any cracks. Remove anything that might make you trip as you walk through a door, such as a raised step or threshold. Trim any bushes or trees on the path to your home. Use bright outdoor lighting. Clear any walking paths of anything that might make someone trip, such as rocks or tools. Regularly check to see if handrails are loose or broken. Make sure that both sides of any steps have handrails. Any raised decks and porches should have guardrails on the edges. Have any leaves, snow, or ice cleared regularly. Use sand or salt on walking paths during winter. Clean up any spills in your garage right away. This includes oil or grease spills. What can I do in the bathroom? Use night lights. Install grab bars by the toilet and in the tub and shower. Do not use towel bars as grab bars. Use non-skid mats or decals in the tub or shower. If you need to sit down in the shower, use a plastic, non-slip stool. Keep the floor dry. Clean up any water that spills on the floor as soon as it happens. Remove soap buildup in the tub or shower regularly. Attach bath mats securely with double-sided non-slip rug tape. Do not have throw rugs and other things on the  floor that can make you trip. What can I do in the bedroom? Use night lights. Make sure that you have a light by your bed that is easy to reach. Do not use any sheets or blankets that are too big for your bed. They should not hang down onto the floor. Have a firm chair that has side arms. You can use this for support while you get dressed. Do not have throw rugs and other things on the floor that can make you trip. What can I do in the kitchen? Clean up any spills right away. Avoid walking on wet floors. Keep items that you use a lot in easy-to-reach places. If you need to reach something above you, use a strong step stool that has a grab bar. Keep electrical cords out of the way. Do not use floor polish or wax that makes floors slippery. If you must use wax, use non-skid floor wax. Do not have throw rugs and other things on the floor that can make you trip. What can I do with my stairs? Do not leave any items on the stairs. Make sure that there are handrails on both sides of the stairs and use them. Fix handrails that are broken or loose. Make sure that handrails are as long as the stairways. Check any carpeting to make sure that it is firmly attached to the stairs. Fix any carpet  that is loose or worn. Avoid having throw rugs at the top or bottom of the stairs. If you do have throw rugs, attach them to the floor with carpet tape. Make sure that you have a light switch at the top of the stairs and the bottom of the stairs. If you do not have them, ask someone to add them for you. What else can I do to help prevent falls? Wear shoes that: Do not have high heels. Have rubber bottoms. Are comfortable and fit you well. Are closed at the toe. Do not wear sandals. If you use a stepladder: Make sure that it is fully opened. Do not climb a closed stepladder. Make sure that both sides of the stepladder are locked into place. Ask someone to hold it for you, if possible. Clearly mark and make  sure that you can see: Any grab bars or handrails. First and last steps. Where the edge of each step is. Use tools that help you move around (mobility aids) if they are needed. These include: Canes. Walkers. Scooters. Crutches. Turn on the lights when you go into a dark area. Replace any light bulbs as soon as they burn out. Set up your furniture so you have a clear path. Avoid moving your furniture around. If any of your floors are uneven, fix them. If there are any pets around you, be aware of where they are. Review your medicines with your doctor. Some medicines can make you feel dizzy. This can increase your chance of falling. Ask your doctor what other things that you can do to help prevent falls. This information is not intended to replace advice given to you by your health care provider. Make sure you discuss any questions you have with your health care provider. Document Released: 12/25/2008 Document Revised: 08/06/2015 Document Reviewed: 04/04/2014 Elsevier Interactive Patient Education  2017 [employer].

## 2020-12-16 DIAGNOSIS — I480 Paroxysmal atrial fibrillation: Secondary | ICD-10-CM | POA: Diagnosis not present

## 2020-12-16 DIAGNOSIS — I25118 Atherosclerotic heart disease of native coronary artery with other forms of angina pectoris: Secondary | ICD-10-CM | POA: Diagnosis not present

## 2020-12-16 DIAGNOSIS — R002 Palpitations: Secondary | ICD-10-CM | POA: Diagnosis not present

## 2020-12-21 ENCOUNTER — Encounter (HOSPITAL_COMMUNITY): Payer: Self-pay | Admitting: Radiology

## 2020-12-25 DIAGNOSIS — I480 Paroxysmal atrial fibrillation: Secondary | ICD-10-CM | POA: Diagnosis not present

## 2020-12-25 DIAGNOSIS — I7121 Aneurysm of the ascending aorta, without rupture: Secondary | ICD-10-CM | POA: Diagnosis not present

## 2020-12-25 DIAGNOSIS — R600 Localized edema: Secondary | ICD-10-CM | POA: Diagnosis not present

## 2020-12-25 DIAGNOSIS — C50112 Malignant neoplasm of central portion of left female breast: Secondary | ICD-10-CM | POA: Diagnosis not present

## 2020-12-25 DIAGNOSIS — E782 Mixed hyperlipidemia: Secondary | ICD-10-CM | POA: Diagnosis not present

## 2020-12-25 DIAGNOSIS — Z9013 Acquired absence of bilateral breasts and nipples: Secondary | ICD-10-CM | POA: Diagnosis not present

## 2020-12-25 DIAGNOSIS — I10 Essential (primary) hypertension: Secondary | ICD-10-CM | POA: Diagnosis not present

## 2020-12-25 DIAGNOSIS — I25118 Atherosclerotic heart disease of native coronary artery with other forms of angina pectoris: Secondary | ICD-10-CM | POA: Diagnosis not present

## 2020-12-25 DIAGNOSIS — R002 Palpitations: Secondary | ICD-10-CM | POA: Diagnosis not present

## 2020-12-25 DIAGNOSIS — C50111 Malignant neoplasm of central portion of right female breast: Secondary | ICD-10-CM | POA: Diagnosis not present

## 2020-12-25 DIAGNOSIS — Z23 Encounter for immunization: Secondary | ICD-10-CM | POA: Diagnosis not present

## 2020-12-29 DIAGNOSIS — K862 Cyst of pancreas: Secondary | ICD-10-CM | POA: Diagnosis not present

## 2020-12-29 DIAGNOSIS — R93429 Abnormal radiologic findings on diagnostic imaging of unspecified kidney: Secondary | ICD-10-CM | POA: Diagnosis not present

## 2020-12-29 DIAGNOSIS — K7460 Unspecified cirrhosis of liver: Secondary | ICD-10-CM | POA: Diagnosis not present

## 2021-01-01 ENCOUNTER — Other Ambulatory Visit: Payer: Self-pay | Admitting: Gastroenterology

## 2021-01-01 DIAGNOSIS — K7460 Unspecified cirrhosis of liver: Secondary | ICD-10-CM

## 2021-01-01 DIAGNOSIS — R93429 Abnormal radiologic findings on diagnostic imaging of unspecified kidney: Secondary | ICD-10-CM

## 2021-01-01 DIAGNOSIS — K862 Cyst of pancreas: Secondary | ICD-10-CM

## 2021-01-12 ENCOUNTER — Ambulatory Visit
Admission: RE | Admit: 2021-01-12 | Discharge: 2021-01-12 | Disposition: A | Discharge status: Home / Self Care | Payer: Medicare HMO | Source: Ambulatory Visit | Attending: Gastroenterology | Admitting: Gastroenterology

## 2021-01-12 ENCOUNTER — Other Ambulatory Visit: Payer: Self-pay

## 2021-01-12 DIAGNOSIS — N2889 Other specified disorders of kidney and ureter: Secondary | ICD-10-CM | POA: Diagnosis not present

## 2021-01-12 DIAGNOSIS — K862 Cyst of pancreas: Secondary | ICD-10-CM | POA: Insufficient documentation

## 2021-01-12 DIAGNOSIS — R93429 Abnormal radiologic findings on diagnostic imaging of unspecified kidney: Secondary | ICD-10-CM | POA: Insufficient documentation

## 2021-01-12 DIAGNOSIS — N281 Cyst of kidney, acquired: Secondary | ICD-10-CM | POA: Diagnosis not present

## 2021-01-12 DIAGNOSIS — K7460 Unspecified cirrhosis of liver: Secondary | ICD-10-CM | POA: Diagnosis not present

## 2021-01-12 MED ORDER — GADOBUTROL 1 MMOL/ML IV SOLN
9.0000 mL | Freq: Once | INTRAVENOUS | Status: AC | PRN
  Administered 2021-01-12: 9 mL via INTRAVENOUS

## 2021-03-23 DIAGNOSIS — I25118 Atherosclerotic heart disease of native coronary artery with other forms of angina pectoris: Secondary | ICD-10-CM | POA: Diagnosis not present

## 2021-03-23 DIAGNOSIS — R002 Palpitations: Secondary | ICD-10-CM | POA: Diagnosis not present

## 2021-03-23 DIAGNOSIS — I10 Essential (primary) hypertension: Secondary | ICD-10-CM | POA: Diagnosis not present

## 2021-03-23 DIAGNOSIS — I480 Paroxysmal atrial fibrillation: Secondary | ICD-10-CM | POA: Diagnosis not present

## 2021-03-30 ENCOUNTER — Ambulatory Visit (INDEPENDENT_AMBULATORY_CARE_PROVIDER_SITE_OTHER): Payer: Medicare HMO | Admitting: Family Medicine

## 2021-03-30 ENCOUNTER — Other Ambulatory Visit: Payer: Self-pay

## 2021-03-30 ENCOUNTER — Encounter: Payer: Self-pay | Admitting: Family Medicine

## 2021-03-30 VITALS — BP 122/80 | HR 60 | Ht 65.0 in | Wt 193.0 lb

## 2021-03-30 DIAGNOSIS — H00015 Hordeolum externum left lower eyelid: Secondary | ICD-10-CM | POA: Diagnosis not present

## 2021-03-30 DIAGNOSIS — J0100 Acute maxillary sinusitis, unspecified: Secondary | ICD-10-CM | POA: Diagnosis not present

## 2021-03-30 MED ORDER — NEOMYCIN-POLYMYXIN-DEXAMETH 3.5-10000-0.1 OP OINT: 1.0000 "application " | TOPICAL_OINTMENT | Freq: Three times a day (TID) | OPHTHALMIC | 0 refills | Status: DC

## 2021-03-30 MED ORDER — DOXYCYCLINE HYCLATE 100 MG PO TABS: 100.0000 mg | ORAL_TABLET | Freq: Two times a day (BID) | ORAL | 0 refills | Status: DC

## 2021-03-30 NOTE — Progress Notes (Signed)
doxy   Date:  03/30/2021   Name:  Kaitlyn Oliver   DOB:  [birth year]   MRN:  [id number]   Chief Complaint: nasal drainage (Runny nose, sneezing, no cough- Alkaseltzer cold and flu helped. L) eye sore and red)  Sinusitis This is a new problem. The current episode started in the past 7 days. The problem has been gradually improving since onset. There has been no fever. The pain is mild. Associated symptoms include congestion, headaches and sinus pressure. Pertinent negatives include no chills, coughing, diaphoresis, ear pain, neck pain, shortness of breath or sore throat. Past treatments include acetaminophen and oral decongestants. The treatment provided mild relief.  Eye Pain  The left eye is affected. This is a new problem. The current episode started today. The problem occurs daily. The pain is mild. Pertinent negatives include no fever or nausea.   Lab Results  Component Value Date   NA 139 11/19/2020   K 3.2 (L) 11/19/2020   CO2 23 11/19/2020   GLUCOSE 125 (H) 11/19/2020   BUN 14 11/19/2020   CREATININE 0.78 11/19/2020   CALCIUM 9.4 11/19/2020   GFRNONAA >60 11/19/2020   Lab Results  Component Value Date   CHOL 227 (H) 02/15/2017   HDL 33 (L) 02/15/2017   LDLCALC Comment 02/15/2017   TRIG 534 (H) 02/15/2017   CHOLHDL 6.9 (H) 02/15/2017   Lab Results  Component Value Date   TSH 6.222 (H) 11/19/2020   Lab Results  Component Value Date   HGBA1C 5.6 01/01/2020   Lab Results  Component Value Date   WBC 5.1 11/19/2020   HGB 14.1 11/19/2020   HCT 39.8 11/19/2020   MCV 89.8 11/19/2020   PLT 142 (L) 11/19/2020   Lab Results  Component Value Date   ALT 30 07/27/2020   AST 36 07/27/2020   ALKPHOS 59 07/27/2020   BILITOT 0.8 07/27/2020   No results found for: 25OHVITD2, 25OHVITD3, VD25OH   Review of Systems  Constitutional:  Negative for chills, diaphoresis and fever.  HENT:  Positive for congestion and sinus pressure. Negative for drooling, ear discharge, ear  pain and sore throat.   Eyes:  Positive for pain.  Respiratory:  Negative for cough, shortness of breath and wheezing.   Cardiovascular:  Negative for chest pain, palpitations and leg swelling.  Gastrointestinal:  Negative for abdominal pain, blood in stool, constipation, diarrhea and nausea.  Endocrine: Negative for polydipsia.  Genitourinary:  Negative for dysuria, frequency, hematuria and urgency.  Musculoskeletal:  Negative for back pain, myalgias and neck pain.  Skin:  Negative for rash.  Allergic/Immunologic: Negative for environmental allergies.  Neurological:  Positive for headaches. Negative for dizziness.  Hematological:  Does not bruise/bleed easily.  Psychiatric/Behavioral:  Negative for suicidal ideas. The patient is not nervous/anxious.    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   Goals of care, counseling/discussion 01/26/2018   Small bowel tumor    Pancreatic cyst 01/15/2018   Anxiety and depression 01/15/2018   Bowel obstruction ([place]) 11/29/2017   Small bowel obstruction ([place])  Thoracoabdominal aortic aneurysm (TAAA) 11/19/2017   Status post total knee replacement using cement, right 05/30/2017   GERD (gastroesophageal reflux disease) 12/22/2015   Adenomatous polyp 09/11/2014   Bilateral cataracts 09/11/2014   Gastric catarrh 09/11/2014   Gastroduodenal ulcer 09/11/2014   Hypothyroidism 09/11/2014   Cardiac murmur 10/08/2013   Accelerated hypertension 10/08/2013   Combined fat and carbohydrate induced hyperlipemia  10/08/2013    Allergies  Allergen Reactions   Valsartan Other (See Comments)    Other reaction(s): Dizziness   Adhesive [Tape] Dermatitis    Can not use plastic tape. Paper tape ok    Amlodipine Swelling    Throat swelling   Asa [Aspirin]     Itchy    Hydralazine     Other reaction(s): Other (See Comments) oliguria   Hydrochlorothiazide Other (See Comments)    Hypokalemia leading to AFib   Penicillin G Itching    Has patient had a PCN reaction causing immediate rash, facial/tongue/throat swelling, SOB or lightheadedness with hypotension: No Has patient had a PCN reaction causing severe rash involving mucus membranes or skin necrosis: No Has patient had a PCN reaction that required hospitalization: No Has patient had a PCN reaction occurring within the last 10 years: No If all of the above answers are "NO", then may proceed with Cephalosporin use.    Hydrocodone-Acetaminophen Itching and Rash    Past Surgical History:  Procedure Laterality Date   BOWEL RESECTION  01/17/2018   Procedure: SMALL BOWEL RESECTION;  Surgeon: Jules Husbands, MD;  Location: [facility];  Service: General;;   BREAST SURGERY Bilateral 1991   mastectomy   CATARACT EXTRACTION Bilateral    CHOLECYSTECTOMY     COLONOSCOPY  2013   Dr Aura Camps [place] Right 06/03/2015   Procedure: HALLUX VALGUS AUSTIN;  Surgeon: Samara Deist, DPM;  Location: [facility];  Service: Podiatry;  Laterality: Right;  WITH POPLITEAL   KNEE ARTHROSCOPY WITH MEDIAL MENISECTOMY Right 08/30/2016   Procedure: KNEE ARTHROSCOPY WITH PARTIAL MEDIAL MENISECTOMY and Partial Lateral Menisectomy;  Surgeon: Hessie Knows, MD;  Location: [facility];  Service: Orthopedics;  Laterality: Right;   KNEE ARTHROSCOPY WITH SUBCHONDROPLASTY Right 08/30/2016   Procedure: KNEE ARTHROSCOPY WITH SUBCHONDROPLASTY;  Surgeon: Hessie Knows, MD;  Location: [facility];  Service: Orthopedics;  Laterality: Right;   LAPAROTOMY N/A 01/17/2018    Procedure: EXPLORATORY LAPAROTOMY;  Surgeon: Jules Husbands, MD;  Location: [facility];  Service: General;  Laterality: N/A;   MASTECTOMY Bilateral    ROTATOR CUFF REPAIR Bilateral    TONSILLECTOMY     TOTAL KNEE ARTHROPLASTY Left    TOTAL KNEE ARTHROPLASTY Right 05/30/2017   Procedure: TOTAL KNEE ARTHROPLASTY;  Surgeon: Hessie Knows, MD;  Location: [facility];  Service: Orthopedics;  Laterality: Right;   VAGINAL HYSTERECTOMY      Social History   Tobacco Use   Smoking status: Former    Packs/day: 0.25    Years: 10.00    Pack years: 2.50    Types: Cigarettes    Quit date: 1991    Years since quitting: 32.0   Smokeless tobacco: Never   Tobacco comments:    smoking cessation materials not required  Vaping Use   Vaping Use: Never used  Substance Use Topics   Alcohol use: No    Alcohol/week: 0.0 standard drinks   Drug use: No     Medication list has been reviewed and updated.  Current Meds  Medication Sig   EUTHYROX 50 MCG tablet Take  50 mcg by mouth daily. Dr Honor Junes   nystatin cream (MYCOSTATIN) Apply 1 application topically 2 (two) times daily.   omeprazole (PRILOSEC) 20 MG capsule Take 1 capsule (20 mg total) by mouth daily.   potassium chloride (KLOR-CON) 10 MEQ tablet Take by mouth.    PHQ 2/9 Scores 12/07/2020 09/22/2020 06/25/2020 01/06/2020  PHQ - 2 Score 0 6 2 0  PHQ- 9 Score - 12 10 1     GAD 7 : Generalized Anxiety Score 09/22/2020 06/25/2020 01/06/2020 09/05/2019  Nervous, Anxious, on Edge 2 1 0 0  Control/stop worrying 1 1 0 0  Worry too much - different things 1 1 0 0  Trouble relaxing 2 0 0 0  Restless 3 0 0 0  Easily annoyed or irritable 3 1 0 2  Afraid - awful might happen 1 0 0 0  Total GAD 7 Score 13 4 0 2  Anxiety Difficulty Very difficult - - Not difficult at all    BP Readings from Last 3 Encounters:  03/30/21 122/80  11/19/20 (!) 169/99  09/22/20 128/80    Physical Exam Vitals and nursing note reviewed.  Constitutional:      Appearance:  She is well-developed.  HENT:     Head: Normocephalic.     Right Ear: Tympanic membrane, ear canal and external ear normal.     Left Ear: Tympanic membrane, ear canal and external ear normal.     Nose: Nose normal. No congestion or rhinorrhea.  Eyes:     General: Lids are everted, no foreign bodies appreciated. No scleral icterus.       Left eye: No foreign body or hordeolum.     Conjunctiva/sclera:     Right eye: Right conjunctiva is not injected.     Left eye: Left conjunctiva is injected.     Pupils: Pupils are equal, round, and reactive to light.  Neck:     Thyroid: No thyromegaly.     Vascular: No JVD.     Trachea: No tracheal deviation.  Cardiovascular:     Rate and Rhythm: Normal rate and regular rhythm.     Heart sounds: Normal heart sounds. No murmur heard.   No friction rub. No gallop.  Pulmonary:     Effort: Pulmonary effort is normal. No respiratory distress.     Breath sounds: Normal breath sounds. No wheezing, rhonchi or rales.  Chest:     Chest wall: No tenderness.  Abdominal:     General: Bowel sounds are normal.     Palpations: Abdomen is soft. There is no mass.     Tenderness: There is no abdominal tenderness. There is no guarding or rebound.  Musculoskeletal:        General: No tenderness. Normal range of motion.     Cervical back: Normal range of motion and neck supple.  Lymphadenopathy:     Cervical: No cervical adenopathy.  Skin:    General: Skin is warm.     Findings: No rash.  Neurological:     Mental Status: She is alert and oriented to person, place, and time.     Cranial Nerves: No cranial nerve deficit.     Deep Tendon Reflexes: Reflexes normal.  Psychiatric:        Mood and Affect: Mood is not anxious or depressed.    Wt Readings from Last 3 Encounters:  03/30/21 193 lb (87.5 kg)  01/12/21 194 lb (88 kg)  09/22/20 194 lb (88 kg)    BP 122/80  Pulse 60    Ht 5\' 5"  (1.651 m)    Wt 193 lb (87.5 kg)    BMI 32.12 kg/m   Assessment and  Plan:  1. Acute non-recurrent maxillary sinusitis Acute.  Persistent.  Stable.  Onset of sinus with tenderness over both maxillary's and left frontal.  He will treat with doxycycline 100 mg twice a day. - doxycycline (VIBRA-TABS) 100 MG tablet; Take 1 tablet (100 mg total) by mouth 2 (two) times daily.  Dispense: 20 tablet; Refill: 0  2. Hordeolum externum of left lower eyelid New onset.  Persistent.  Stable.  Woke and there is a little whitehead of the lower eyelash.  Consistent with a stye.  We will treat with neomycin ophthalmic ointment 3 times a day. - neomycin-polymyxin b-dexamethasone (MAXITROL) 3.5-10000-0.1 OINT; Place 1 application into the left eye 3 (three) times daily.  Dispense: 3.5 g; Refill: 0

## 2021-04-02 DIAGNOSIS — H00025 Hordeolum internum left lower eyelid: Secondary | ICD-10-CM | POA: Diagnosis not present

## 2021-04-26 DIAGNOSIS — E782 Mixed hyperlipidemia: Secondary | ICD-10-CM | POA: Diagnosis not present

## 2021-04-26 DIAGNOSIS — I480 Paroxysmal atrial fibrillation: Secondary | ICD-10-CM | POA: Diagnosis not present

## 2021-04-26 DIAGNOSIS — I10 Essential (primary) hypertension: Secondary | ICD-10-CM | POA: Diagnosis not present

## 2021-04-26 DIAGNOSIS — I25118 Atherosclerotic heart disease of native coronary artery with other forms of angina pectoris: Secondary | ICD-10-CM | POA: Diagnosis not present

## 2021-04-29 DIAGNOSIS — I480 Paroxysmal atrial fibrillation: Secondary | ICD-10-CM | POA: Diagnosis not present

## 2021-04-30 DIAGNOSIS — I25118 Atherosclerotic heart disease of native coronary artery with other forms of angina pectoris: Secondary | ICD-10-CM | POA: Diagnosis not present

## 2021-04-30 DIAGNOSIS — I10 Essential (primary) hypertension: Secondary | ICD-10-CM | POA: Diagnosis not present

## 2021-04-30 DIAGNOSIS — Z01818 Encounter for other preprocedural examination: Secondary | ICD-10-CM | POA: Diagnosis not present

## 2021-05-06 ENCOUNTER — Encounter
Admission: RE | Disposition: A | Discharge status: Home / Self Care | Payer: Self-pay | Source: Ambulatory Visit | Attending: Cardiology

## 2021-05-06 ENCOUNTER — Encounter: Payer: Self-pay | Admitting: Cardiology

## 2021-05-06 ENCOUNTER — Other Ambulatory Visit: Payer: Self-pay

## 2021-05-06 ENCOUNTER — Observation Stay
Admission: RE | Admit: 2021-05-06 | Discharge: 2021-05-07 | Disposition: A | Discharge status: Home / Self Care | Payer: Medicare HMO | Source: Ambulatory Visit | Attending: Cardiology | Admitting: Cardiology

## 2021-05-06 DIAGNOSIS — R079 Chest pain, unspecified: Secondary | ICD-10-CM

## 2021-05-06 DIAGNOSIS — Z79899 Other long term (current) drug therapy: Secondary | ICD-10-CM | POA: Diagnosis not present

## 2021-05-06 DIAGNOSIS — Z87891 Personal history of nicotine dependence: Secondary | ICD-10-CM | POA: Diagnosis not present

## 2021-05-06 DIAGNOSIS — Z85060 Personal history of malignant carcinoid tumor of small intestine: Secondary | ICD-10-CM | POA: Diagnosis not present

## 2021-05-06 DIAGNOSIS — Z853 Personal history of malignant neoplasm of breast: Secondary | ICD-10-CM | POA: Diagnosis not present

## 2021-05-06 DIAGNOSIS — I10 Essential (primary) hypertension: Secondary | ICD-10-CM | POA: Diagnosis not present

## 2021-05-06 DIAGNOSIS — I2510 Atherosclerotic heart disease of native coronary artery without angina pectoris: Secondary | ICD-10-CM | POA: Diagnosis present

## 2021-05-06 DIAGNOSIS — I25118 Atherosclerotic heart disease of native coronary artery with other forms of angina pectoris: Secondary | ICD-10-CM | POA: Diagnosis present

## 2021-05-06 DIAGNOSIS — Z96651 Presence of right artificial knee joint: Secondary | ICD-10-CM | POA: Diagnosis not present

## 2021-05-06 DIAGNOSIS — I25110 Atherosclerotic heart disease of native coronary artery with unstable angina pectoris: Secondary | ICD-10-CM | POA: Diagnosis not present

## 2021-05-06 DIAGNOSIS — I480 Paroxysmal atrial fibrillation: Secondary | ICD-10-CM | POA: Insufficient documentation

## 2021-05-06 HISTORY — PX: LEFT HEART CATH AND CORONARY ANGIOGRAPHY: CATH118249

## 2021-05-06 HISTORY — PX: CORONARY STENT INTERVENTION: CATH118234

## 2021-05-06 SURGERY — LEFT HEART CATH AND CORONARY ANGIOGRAPHY
Anesthesia: Moderate Sedation

## 2021-05-06 MED ORDER — BIVALIRUDIN TRIFLUOROACETATE 250 MG IV SOLR
INTRAVENOUS | Status: AC
  Filled 2021-05-06: qty 250

## 2021-05-06 MED ORDER — LIDOCAINE HCL (PF) 1 % IJ SOLN
INTRAMUSCULAR | Status: DC | PRN
  Administered 2021-05-06: 15 mL
  Administered 2021-05-06: 2 mL

## 2021-05-06 MED ORDER — DIPHENHYDRAMINE HCL 25 MG PO CAPS
ORAL_CAPSULE | ORAL | Status: AC
  Filled 2021-05-06: qty 1

## 2021-05-06 MED ORDER — HEPARIN (PORCINE) IN NACL 1000-0.9 UT/500ML-% IV SOLN
INTRAVENOUS | Status: DC | PRN
  Administered 2021-05-06 (×4): 500 mL

## 2021-05-06 MED ORDER — ASPIRIN 81 MG PO CHEW
81.0000 mg | CHEWABLE_TABLET | ORAL | Status: AC
  Administered 2021-05-06: 81 mg via ORAL

## 2021-05-06 MED ORDER — FENTANYL CITRATE (PF) 100 MCG/2ML IJ SOLN
INTRAMUSCULAR | Status: DC | PRN
  Administered 2021-05-06 (×2): 12.5 ug via INTRAVENOUS

## 2021-05-06 MED ORDER — ASPIRIN 81 MG PO CHEW
CHEWABLE_TABLET | ORAL | Status: AC
  Filled 2021-05-06: qty 1

## 2021-05-06 MED ORDER — LABETALOL HCL 5 MG/ML IV SOLN: 10.0000 mg | INTRAVENOUS | Status: AC | PRN

## 2021-05-06 MED ORDER — VERAPAMIL HCL 2.5 MG/ML IV SOLN
INTRAVENOUS | Status: AC
  Filled 2021-05-06: qty 2

## 2021-05-06 MED ORDER — HEPARIN (PORCINE) IN NACL 1000-0.9 UT/500ML-% IV SOLN
INTRAVENOUS | Status: AC
  Filled 2021-05-06: qty 1000

## 2021-05-06 MED ORDER — LEVOTHYROXINE SODIUM 50 MCG PO TABS
50.0000 ug | ORAL_TABLET | Freq: Every day | ORAL | Status: DC
  Administered 2021-05-06 – 2021-05-07 (×2): 50 ug via ORAL
  Filled 2021-05-06 (×2): qty 1

## 2021-05-06 MED ORDER — SODIUM CHLORIDE 0.9 % WEIGHT BASED INFUSION: 1.0000 mL/kg/h | INTRAVENOUS | Status: DC

## 2021-05-06 MED ORDER — SODIUM CHLORIDE 0.9% FLUSH: 3.0000 mL | INTRAVENOUS | Status: DC | PRN

## 2021-05-06 MED ORDER — MIDAZOLAM HCL 2 MG/2ML IJ SOLN
INTRAMUSCULAR | Status: AC
  Filled 2021-05-06: qty 2

## 2021-05-06 MED ORDER — SODIUM CHLORIDE 0.9 % WEIGHT BASED INFUSION
3.0000 mL/kg/h | INTRAVENOUS | Status: AC
  Administered 2021-05-06: 3 mL/kg/h via INTRAVENOUS

## 2021-05-06 MED ORDER — LIDOCAINE HCL 1 % IJ SOLN
INTRAMUSCULAR | Status: AC
  Filled 2021-05-06: qty 20

## 2021-05-06 MED ORDER — SODIUM CHLORIDE 0.9 % WEIGHT BASED INFUSION: 1.0000 mL/kg/h | INTRAVENOUS | Status: AC

## 2021-05-06 MED ORDER — POTASSIUM CHLORIDE CRYS ER 10 MEQ PO TBCR
10.0000 meq | EXTENDED_RELEASE_TABLET | Freq: Two times a day (BID) | ORAL | Status: DC
Start: 2021-05-06 — End: 2021-05-07
  Administered 2021-05-06 – 2021-05-07 (×2): 10 meq via ORAL
  Filled 2021-05-06 (×3): qty 1

## 2021-05-06 MED ORDER — IOHEXOL 300 MG/ML  SOLN
INTRAMUSCULAR | Status: DC | PRN
  Administered 2021-05-06: 230 mL

## 2021-05-06 MED ORDER — ONDANSETRON HCL 4 MG/2ML IJ SOLN
INTRAMUSCULAR | Status: AC
  Administered 2021-05-06: 4 mg via INTRAVENOUS
  Filled 2021-05-06: qty 2

## 2021-05-06 MED ORDER — HYDRALAZINE HCL 20 MG/ML IJ SOLN
INTRAMUSCULAR | Status: AC
  Filled 2021-05-06: qty 1

## 2021-05-06 MED ORDER — HYDRALAZINE HCL 20 MG/ML IJ SOLN
INTRAMUSCULAR | Status: DC | PRN
  Administered 2021-05-06: 10 mg via INTRAVENOUS

## 2021-05-06 MED ORDER — SODIUM CHLORIDE 0.9 % IV SOLN
INTRAVENOUS | Status: DC | PRN
  Administered 2021-05-06 (×2): 1.75 mg/kg/h via INTRAVENOUS

## 2021-05-06 MED ORDER — DIPHENHYDRAMINE HCL 25 MG PO CAPS
50.0000 mg | ORAL_CAPSULE | Freq: Once | ORAL | Status: AC
  Administered 2021-05-06: 50 mg via ORAL

## 2021-05-06 MED ORDER — HYDRALAZINE HCL 20 MG/ML IJ SOLN: 10.0000 mg | INTRAMUSCULAR | Status: AC | PRN

## 2021-05-06 MED ORDER — SODIUM CHLORIDE 0.9 % IV SOLN
INTRAVENOUS | Status: AC | PRN
Start: 2021-05-06 — End: 2021-05-06
  Administered 2021-05-06: 250 mL via INTRAVENOUS

## 2021-05-06 MED ORDER — SODIUM CHLORIDE 0.9% FLUSH
3.0000 mL | Freq: Two times a day (BID) | INTRAVENOUS | Status: DC
  Administered 2021-05-06: 3 mL via INTRAVENOUS

## 2021-05-06 MED ORDER — CLOPIDOGREL BISULFATE 75 MG PO TABS
75.0000 mg | ORAL_TABLET | Freq: Every day | ORAL | Status: DC
  Administered 2021-05-07: 75 mg via ORAL
  Filled 2021-05-06: qty 1

## 2021-05-06 MED ORDER — SODIUM CHLORIDE 0.9 % IV SOLN
250.0000 mL | INTRAVENOUS | Status: DC | PRN
  Administered 2021-05-06: 1000 mL via INTRAVENOUS

## 2021-05-06 MED ORDER — MIDAZOLAM HCL 2 MG/2ML IJ SOLN
INTRAMUSCULAR | Status: DC | PRN
  Administered 2021-05-06 (×2): .5 mg via INTRAVENOUS

## 2021-05-06 MED ORDER — METOPROLOL TARTRATE 5 MG/5ML IV SOLN
INTRAVENOUS | Status: DC | PRN
  Administered 2021-05-06: 5 mg via INTRAVENOUS

## 2021-05-06 MED ORDER — ACETAMINOPHEN 325 MG PO TABS
650.0000 mg | ORAL_TABLET | ORAL | Status: DC | PRN
  Administered 2021-05-06: 650 mg via ORAL
  Filled 2021-05-06: qty 2

## 2021-05-06 MED ORDER — NITROGLYCERIN IN D5W 200-5 MCG/ML-% IV SOLN
INTRAVENOUS | Status: DC | PRN
Start: 2021-05-06 — End: 2021-05-06
  Administered 2021-05-06: 10 ug/min via INTRAVENOUS

## 2021-05-06 MED ORDER — FENTANYL CITRATE (PF) 100 MCG/2ML IJ SOLN
INTRAMUSCULAR | Status: AC
  Filled 2021-05-06: qty 2

## 2021-05-06 MED ORDER — BIVALIRUDIN BOLUS VIA INFUSION - CUPID
INTRAVENOUS | Status: DC | PRN
  Administered 2021-05-06: 66 mg via INTRAVENOUS

## 2021-05-06 MED ORDER — ASPIRIN 81 MG PO CHEW
81.0000 mg | CHEWABLE_TABLET | Freq: Every day | ORAL | Status: DC
  Filled 2021-05-06: qty 1

## 2021-05-06 MED ORDER — HYDRALAZINE HCL 25 MG PO TABS
25.0000 mg | ORAL_TABLET | Freq: Three times a day (TID) | ORAL | Status: DC
  Filled 2021-05-06: qty 1

## 2021-05-06 MED ORDER — SODIUM CHLORIDE 0.9 % IV SOLN: 250.0000 mL | INTRAVENOUS | Status: DC | PRN

## 2021-05-06 MED ORDER — METOPROLOL TARTRATE 5 MG/5ML IV SOLN
INTRAVENOUS | Status: AC
  Filled 2021-05-06: qty 5

## 2021-05-06 MED ORDER — CLOPIDOGREL BISULFATE 75 MG PO TABS
ORAL_TABLET | ORAL | Status: DC | PRN
  Administered 2021-05-06: 600 mg via ORAL

## 2021-05-06 MED ORDER — SERTRALINE HCL 50 MG PO TABS
25.0000 mg | ORAL_TABLET | Freq: Every day | ORAL | Status: DC
Start: 2021-05-06 — End: 2021-05-07
  Administered 2021-05-06: 25 mg via ORAL
  Filled 2021-05-06 (×2): qty 1

## 2021-05-06 MED ORDER — NITROGLYCERIN 1 MG/10 ML FOR IR/CATH LAB
INTRA_ARTERIAL | Status: DC | PRN
  Administered 2021-05-06 (×2): 200 ug via INTRACORONARY

## 2021-05-06 MED ORDER — DIPHENHYDRAMINE HCL 50 MG/ML IJ SOLN
INTRAMUSCULAR | Status: AC
  Filled 2021-05-06: qty 1

## 2021-05-06 MED ORDER — SODIUM CHLORIDE 0.9 % IV BOLUS: 250.0000 mL | INTRAVENOUS | Status: DC

## 2021-05-06 MED ORDER — ATROPINE SULFATE 1 MG/10ML IJ SOSY
PREFILLED_SYRINGE | INTRAMUSCULAR | Status: AC
  Filled 2021-05-06: qty 10

## 2021-05-06 MED ORDER — ISOSORBIDE MONONITRATE ER 60 MG PO TB24
60.0000 mg | ORAL_TABLET | Freq: Every day | ORAL | Status: DC
  Administered 2021-05-06 – 2021-05-07 (×2): 60 mg via ORAL
  Filled 2021-05-06 (×2): qty 1

## 2021-05-06 MED ORDER — CLOPIDOGREL BISULFATE 75 MG PO TABS
ORAL_TABLET | ORAL | Status: AC
Start: 2021-05-06 — End: ?
  Filled 2021-05-06: qty 8

## 2021-05-06 MED ORDER — ONDANSETRON HCL 4 MG/2ML IJ SOLN: 4.0000 mg | Freq: Four times a day (QID) | INTRAMUSCULAR | Status: DC | PRN

## 2021-05-06 MED ORDER — METOPROLOL SUCCINATE ER 25 MG PO TB24
25.0000 mg | ORAL_TABLET | Freq: Every day | ORAL | Status: DC
Start: 2021-05-06 — End: 2021-05-07
  Administered 2021-05-07: 25 mg via ORAL
  Filled 2021-05-06 (×2): qty 1

## 2021-05-06 MED ORDER — HEPARIN SODIUM (PORCINE) 1000 UNIT/ML IJ SOLN
INTRAMUSCULAR | Status: AC
  Filled 2021-05-06: qty 10

## 2021-05-06 SURGICAL SUPPLY — 32 items
BALLN TREK RX 2.25X15 (BALLOONS) ×6
BALLN ~~LOC~~ EUPHORA RX 2.5X20 (BALLOONS) ×3
BALLOON TREK RX 2.25X15 (BALLOONS) IMPLANT
BALLOON ~~LOC~~ EUPHORA RX 2.5X20 (BALLOONS) IMPLANT
CATH 5F 110X4 TIG (CATHETERS) ×1 IMPLANT
CATH INFINITI 5FR ANG PIGTAIL (CATHETERS) ×1 IMPLANT
CATH INFINITI 5FR JL4 (CATHETERS) ×1 IMPLANT
CATH INFINITI 5FR JL5 (CATHETERS) ×1 IMPLANT
CATH INFINITI JR4 5F (CATHETERS) ×1 IMPLANT
CATH TELESCOPE 6F GEC (CATHETERS) ×1 IMPLANT
CATH VISTA GUIDE 6FR XB4 (CATHETERS) ×1 IMPLANT
DEVICE CLOSURE MYNXGRIP 6/7F (Vascular Products) ×1 IMPLANT
DEVICE RAD TR BAND REGULAR (VASCULAR PRODUCTS) ×1 IMPLANT
DRAPE BRACHIAL (DRAPES) ×1 IMPLANT
GLIDESHEATH SLEND SS 6F .021 (SHEATH) ×1 IMPLANT
GUIDEWIRE INQWIRE 1.5J.035X260 (WIRE) IMPLANT
INQWIRE 1.5J .035X260CM (WIRE) ×3
KIT ENCORE 26 ADVANTAGE (KITS) ×1 IMPLANT
KIT SYRINGE INJ CVI SPIKEX1 (MISCELLANEOUS) ×1 IMPLANT
NDL PERC 18GX7CM (NEEDLE) IMPLANT
NEEDLE PERC 18GX7CM (NEEDLE) ×3 IMPLANT
PACK CARDIAC CATH (CUSTOM PROCEDURE TRAY) ×3 IMPLANT
PROTECTION STATION PRESSURIZED (MISCELLANEOUS) ×3
SET ATX SIMPLICITY (MISCELLANEOUS) ×1 IMPLANT
SHEATH AVANTI 5FR X 11CM (SHEATH) ×1 IMPLANT
SHEATH AVANTI 6FR X 11CM (SHEATH) ×1 IMPLANT
STATION PROTECTION PRESSURIZED (MISCELLANEOUS) IMPLANT
TUBING CIL FLEX 10 FLL-RA (TUBING) ×1 IMPLANT
WIRE ASAHI PROWATER 180CM (WIRE) ×1 IMPLANT
WIRE G HI TQ BMW 190 (WIRE) ×1 IMPLANT
WIRE GUIDERIGHT .035X150 (WIRE) ×1 IMPLANT
WIRE HITORQ VERSACORE ST 145CM (WIRE) ×1 IMPLANT

## 2021-05-06 NOTE — Plan of Care (Signed)

## 2021-05-06 NOTE — Plan of Care (Signed)

## 2021-05-06 NOTE — Progress Notes (Addendum)
Pt. C/o severe CP 7 on scale 1-10. Dr. Saralyn Pilar messaged. 12 lead EKG done. NTG gtt started and titrated up to 20 mcg/min., then BP dropped to 73/43. NS bolus 250 ml ordered now.

## 2021-05-06 NOTE — Progress Notes (Addendum)
Dr. Saralyn Pilar in at bedside, speaking to pt. Re: CP episode & "possible vagal" again "like in the cath lab." MD saw 12 lead EKG & said "it looks fine."

## 2021-05-06 NOTE — Progress Notes (Addendum)
Pt is refusing hydralazine tonight and is requesting metropolol instead due to blood pressure issue. NP Randol Kern made aware and states that [facility] not following the pt and call cardiology instead. MD [place] was page. Will continue to monitor.  Update 2121: Pt refused hydralazine but was educated about its importance. Will continue to monitor.   Update 0140: Re-page cardiology MD [place] but has not got response yet. NP Randol Kern made aware. Will continue to monitor.

## 2021-05-06 NOTE — Progress Notes (Signed)
Dr. Saralyn Pilar in at bedside, speaking with pt. And daughter Delilah. Both verbalized understanding of of conversation.

## 2021-05-07 DIAGNOSIS — Z853 Personal history of malignant neoplasm of breast: Secondary | ICD-10-CM | POA: Diagnosis not present

## 2021-05-07 DIAGNOSIS — I25118 Atherosclerotic heart disease of native coronary artery with other forms of angina pectoris: Secondary | ICD-10-CM | POA: Diagnosis not present

## 2021-05-07 DIAGNOSIS — R079 Chest pain, unspecified: Secondary | ICD-10-CM | POA: Diagnosis not present

## 2021-05-07 DIAGNOSIS — Z87891 Personal history of nicotine dependence: Secondary | ICD-10-CM | POA: Diagnosis not present

## 2021-05-07 DIAGNOSIS — I480 Paroxysmal atrial fibrillation: Secondary | ICD-10-CM | POA: Diagnosis not present

## 2021-05-07 DIAGNOSIS — I2510 Atherosclerotic heart disease of native coronary artery without angina pectoris: Secondary | ICD-10-CM | POA: Diagnosis not present

## 2021-05-07 DIAGNOSIS — Z96651 Presence of right artificial knee joint: Secondary | ICD-10-CM | POA: Diagnosis not present

## 2021-05-07 DIAGNOSIS — Z79899 Other long term (current) drug therapy: Secondary | ICD-10-CM | POA: Diagnosis not present

## 2021-05-07 DIAGNOSIS — Z85060 Personal history of malignant carcinoid tumor of small intestine: Secondary | ICD-10-CM | POA: Diagnosis not present

## 2021-05-07 DIAGNOSIS — I10 Essential (primary) hypertension: Secondary | ICD-10-CM | POA: Diagnosis not present

## 2021-05-07 MED ORDER — METOPROLOL SUCCINATE ER 25 MG PO TB24: 25.0000 mg | ORAL_TABLET | Freq: Every day | ORAL | 3 refills | Status: DC

## 2021-05-07 MED ORDER — ISOSORBIDE MONONITRATE ER 60 MG PO TB24: 60.0000 mg | ORAL_TABLET | Freq: Every day | ORAL | 3 refills | Status: DC

## 2021-05-07 MED ORDER — CLOPIDOGREL BISULFATE 75 MG PO TABS: 75.0000 mg | ORAL_TABLET | Freq: Every day | ORAL | 12 refills | Status: DC

## 2021-05-07 MED ORDER — SERTRALINE HCL 25 MG PO TABS: 25.0000 mg | ORAL_TABLET | Freq: Every day | ORAL | 3 refills | Status: DC

## 2021-05-07 NOTE — Discharge Summary (Addendum)
Physician Discharge Summary      Patient ID: Kaitlyn Oliver MRN: [id number] DOB/AGE: [birth year] 88 y.o.  Admit date: 05/06/2021 Discharge date: 05/07/2021  Primary Discharge Diagnosis Coronary artery disease, chest pain  Secondary Discharge Diagnosis same  Significant Diagnostic Studies: angiography:   05/06/2021   Mid Cx to Dist Cx lesion is 60% stenosed.   Mid Cx lesion is 70% stenosed.   Mid LAD to Dist LAD lesion is 70% stenosed.   Prox RCA lesion is 60% stenosed.   Mid RCA to Dist RCA lesion is 50% stenosed.   Mid LAD lesion is 80% stenosed.   Balloon angioplasty was performed using a [place] [id number].   Post intervention, there is a 45% residual stenosis.   The left ventricular systolic function is normal.   The left ventricular ejection fraction is 55-65% by visual estimate.   1.  Two-vessel coronary artery disease with 80% stenosis mid LAD, 70% stenosis mid left circumflex 2.  Normal left ventricular function 3.  Successful PTCA of mid LAD, unable to deliver [place] balloon due to calcification   Recommendations   1.  Dual antiplatelet therapy 2.  Add isosorbide mononitrate 60 mg daily   Consults: none  Hospital Course: The patient was brought to the cardiac cath lab and underwent left heart cath with Dr. Isaias Cowman on 05/06/22. The patient tolerated with procedure well without complications. PTCA of mid LAD was successful, unable to deliver [place] balloon due to calcification. DAPT and Isosorbide were recommended. The patient has an allergy to aspirin (itching, welts). She took isosorbide without difficulty. The patient was given care instructions and warning symptoms to look out at the access site and will follow up in office in 1 week, or sooner if needed.     Discharge Exam: Blood pressure (!) 159/74, pulse 61, temperature 97.9 F (36.6 C), resp. rate 16, height 5\' 5"  (1.651 m), weight 88 kg, SpO2 96 %.    PHYSICAL EXAM General: Pleasant elderly  caucasian female, well nourished, in no acute distress. Sitting up in PCU bed with daughter at bedside HEENT:  Normocephalic and atraumatic. Neck:   No JVD.  Lungs: Normal respiratory effort on room air.  Clear to ascultation bilaterally. Heart: HRRR . Normal S1 and S2 without gallops or murmurs. Radial pulses 2+ bilaterally. Abdomen: non-distended appearing.  Msk: Normal strength and tone for age. Extremities: No clubbing, cyanosis, edema. R groin with gauze and tegaderm in place without significant tenderness or erythema.  Neuro: Alert and oriented x3 Psych:  mood appropriate for situation.    Labs:   Lab Results  Component Value Date   WBC 5.1 11/19/2020   HGB 14.1 11/19/2020   HCT 39.8 11/19/2020   MCV 89.8 11/19/2020   PLT 142 (L) 11/19/2020   No results for input(s): NA, K, CL, CO2, BUN, CREATININE, CALCIUM, PROT, BILITOT, ALKPHOS, ALT, AST, GLUCOSE in the last 168 hours.  Invalid input(s): LABALBU  EKG: NSR rate 56 poor R Kaitlyn progression without acute ST changes  FOLLOW UP PLANS AND APPOINTMENTS  Follow up with Dr. Saralyn Pilar in 1 week. Please call the office with questions or any problems in the interim.     Follow-up Information     [name], Alexander, MD. Go in 1 week(s).   Specialty: Cardiology Why: 1 week after discharge Contact information: [facility] [place] [address] [phone]  BRING ALL MEDICATIONS WITH YOU TO FOLLOW UP APPOINTMENTS  Time spent with patient : >27mins Signed:  Tristan Schroeder PA-C 05/07/2021, 9:26 AM

## 2021-05-10 LAB — POCT ACTIVATED CLOTTING TIME: Activated Clotting Time: 293 seconds

## 2021-05-13 ENCOUNTER — Other Ambulatory Visit: Payer: Self-pay

## 2021-05-13 ENCOUNTER — Encounter: Payer: Self-pay | Admitting: Family Medicine

## 2021-05-13 ENCOUNTER — Ambulatory Visit (INDEPENDENT_AMBULATORY_CARE_PROVIDER_SITE_OTHER): Payer: Medicare HMO | Admitting: Family Medicine

## 2021-05-13 VITALS — BP 120/80 | HR 64 | Ht 65.0 in | Wt 194.0 lb

## 2021-05-13 DIAGNOSIS — L03012 Cellulitis of left finger: Secondary | ICD-10-CM | POA: Diagnosis not present

## 2021-05-13 DIAGNOSIS — B351 Tinea unguium: Secondary | ICD-10-CM

## 2021-05-13 MED ORDER — DOXYCYCLINE HYCLATE 100 MG PO TABS: 100.0000 mg | ORAL_TABLET | Freq: Two times a day (BID) | ORAL | 0 refills | Status: DC

## 2021-05-13 NOTE — Progress Notes (Signed)
Date:  05/13/2021   Name:  Kaitlyn Oliver   DOB:  Feb 07, 1934   MRN:  [id number]   Chief Complaint: Nail Problem  Patient is a 88 year old female who presents for a nail problem evaluation. The patient reports the following problems: nail fungus vs dystropic. Health maintenance has been reviewed up to date.     Lab Results  Component Value Date   NA 139 11/19/2020   K 3.2 (L) 11/19/2020   CO2 23 11/19/2020   GLUCOSE 125 (H) 11/19/2020   BUN 14 11/19/2020   CREATININE 0.78 11/19/2020   CALCIUM 9.4 11/19/2020   GFRNONAA >60 11/19/2020   Lab Results  Component Value Date   CHOL 227 (H) 02/15/2017   HDL 33 (L) 02/15/2017   LDLCALC Comment 02/15/2017   TRIG 534 (H) 02/15/2017   CHOLHDL 6.9 (H) 02/15/2017   Lab Results  Component Value Date   TSH 6.222 (H) 11/19/2020   Lab Results  Component Value Date   HGBA1C 5.6 01/01/2020   Lab Results  Component Value Date   WBC 5.1 11/19/2020   HGB 14.1 11/19/2020   HCT 39.8 11/19/2020   MCV 89.8 11/19/2020   PLT 142 (L) 11/19/2020   Lab Results  Component Value Date   ALT 30 07/27/2020   AST 36 07/27/2020   ALKPHOS 59 07/27/2020   BILITOT 0.8 07/27/2020   No results found for: 25OHVITD2, 25OHVITD3, VD25OH   Review of Systems  Constitutional:  Negative for chills and fever.  HENT:  Negative for drooling, ear discharge, ear pain and sore throat.   Respiratory:  Negative for cough, shortness of breath and wheezing.   Cardiovascular:  Negative for chest pain, palpitations and leg swelling.  Gastrointestinal:  Negative for abdominal pain, blood in stool, constipation, diarrhea and nausea.  Endocrine: Negative for polydipsia.  Genitourinary:  Negative for dysuria, frequency, hematuria and urgency.  Musculoskeletal:  Negative for back pain, myalgias and neck pain.  Skin:  Negative for rash.  Allergic/Immunologic: Negative for environmental allergies.  Neurological:  Negative for dizziness and headaches.  Hematological:   Does not bruise/bleed easily.  Psychiatric/Behavioral:  Negative for suicidal ideas. The patient is not nervous/anxious.    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   Goals of care, counseling/discussion 01/26/2018   Small bowel tumor    Pancreatic cyst 01/15/2018   Anxiety and depression 01/15/2018   Bowel obstruction ([place]) 11/29/2017   Small bowel obstruction (H[facility])    Thoracoabdominal aortic aneurysm (TAAA) 11/19/2017   Status post total knee replacement using cement, right 05/30/2017   GERD (gastroesophageal reflux disease) 12/22/2015   Adenomatous polyp 09/11/2014   Bilateral cataracts 09/11/2014   Gastric catarrh 09/11/2014   Gastroduodenal ulcer 09/11/2014   Hypothyroidism 09/11/2014   Cardiac murmur 10/08/2013   Accelerated hypertension 10/08/2013   Combined fat and carbohydrate induced hyperlipemia 10/08/2013    Allergies  Allergen Reactions   Nitroglycerin Other (See Comments)    Bottoms out BP to 60's    Nitroglycerin In D5w Other (See Comments)  Bottoms out BP    Valsartan Other (See Comments)    Dizziness   Adhesive [Tape] Dermatitis    Can not use plastic tape. Paper tape ok    Amlodipine Swelling    Throat swelling   Asa [Aspirin] Itching   Hydralazine Other (See Comments)    Oliguria (low urine output)   Hydrochlorothiazide Other (See Comments)    Hypokalemia leading to AFib   Penicillin G Itching    Has patient had a PCN reaction causing immediate rash, facial/tongue/throat swelling, SOB or lightheadedness with  hypotension: No Has patient had a PCN reaction causing severe rash involving mucus membranes or skin necrosis: No Has patient had a PCN reaction that required hospitalization: No Has patient had a PCN reaction occurring within the last 10 years: No If all of the above answers are "NO", then may proceed with Cephalosporin use.    Hydrocodone-Acetaminophen Itching and Rash    Past Surgical History:  Procedure Laterality Date   BOWEL RESECTION  01/17/2018   Procedure: SMALL BOWEL RESECTION;  Surgeon: Jules Husbands, MD;  Location: [facility];  Service: General;;   BREAST SURGERY Bilateral 1991   mastectomy   CATARACT EXTRACTION Bilateral    CHOLECYSTECTOMY     COLONOSCOPY  2013   Dr Gustavo Lah   CORONARY STENT INTERVENTION N/A 05/06/2021   Procedure: CORONARY STENT INTERVENTION;  Surgeon: Isaias Cowman, MD;  Location: [place] [facility];  Service: Cardiovascular;  Laterality: N/A;   HALLUX VALGUS AUSTIN Right 06/03/2015   Procedure: HALLUX VALGUS AUSTIN;  Surgeon: Samara Deist, DPM;  Location: [place] [facility];  Service: Podiatry;  Laterality: Right;  WITH POPLITEAL   KNEE ARTHROSCOPY WITH MEDIAL MENISECTOMY Right 08/30/2016   Procedure: KNEE ARTHROSCOPY WITH PARTIAL MEDIAL MENISECTOMY and Partial Lateral Menisectomy;  Surgeon: Hessie Knows, MD;  Location: [facility];  Service: Orthopedics;  Laterality: Right;   KNEE ARTHROSCOPY WITH SUBCHONDROPLASTY Right 08/30/2016   Procedure: KNEE ARTHROSCOPY WITH SUBCHONDROPLASTY;  Surgeon: Hessie Knows, MD;  Location: [facility];  Service: Orthopedics;  Laterality: Right;   LAPAROTOMY N/A 01/17/2018   Procedure: EXPLORATORY LAPAROTOMY;  Surgeon: Jules Husbands, MD;  Location: [facility];  Service: General;  Laterality: N/A;   LEFT HEART CATH AND CORONARY ANGIOGRAPHY Left 05/06/2021   Procedure: LEFT HEART CATH AND CORONARY ANGIOGRAPHY;  Surgeon: Isaias Cowman, MD;  Location: [place] [facility];  Service: Cardiovascular;  Laterality: Left;    MASTECTOMY Bilateral    ROTATOR CUFF REPAIR Bilateral    TONSILLECTOMY     TOTAL KNEE ARTHROPLASTY Left    TOTAL KNEE ARTHROPLASTY Right 05/30/2017   Procedure: TOTAL KNEE ARTHROPLASTY;  Surgeon: Hessie Knows, MD;  Location: [facility];  Service: Orthopedics;  Laterality: Right;   VAGINAL HYSTERECTOMY      Social History   Tobacco Use   Smoking status: Former    Packs/day: 0.25    Years: 10.00    Pack years: 2.50    Types: Cigarettes    Quit date: 1991    Years since quitting: 32.1   Smokeless tobacco: Never   Tobacco comments:    smoking cessation materials not required  Vaping Use   Vaping Use: Never used  Substance Use Topics   Alcohol use: No    Alcohol/week: 0.0 standard drinks   Drug use: No     Medication list has been reviewed and updated.  Current Meds  Medication Sig   clopidogrel (PLAVIX) 75 MG tablet Take 1 tablet (75 mg total) by mouth daily with  breakfast.   EUTHYROX 50 MCG tablet Take 50 mcg by mouth daily before breakfast. Dr Honor Junes   isosorbide mononitrate (IMDUR) 60 MG 24 hr tablet Take 1 tablet (60 mg total) by mouth daily.   metoprolol succinate (TOPROL-XL) 25 MG 24 hr tablet Take 1 tablet (25 mg total) by mouth daily.   omeprazole (PRILOSEC) 20 MG capsule Take 1 capsule (20 mg total) by mouth daily.   potassium chloride (KLOR-CON) 10 MEQ tablet Take 20 mEq by mouth in the morning.   sertraline (ZOLOFT) 25 MG tablet Take 1 tablet (25 mg total) by mouth daily.    PHQ 2/9 Scores 05/13/2021 12/07/2020 09/22/2020 06/25/2020  PHQ - 2 Score 0 0 6 2  PHQ- 9 Score 2 - 12 10    GAD 7 : Generalized Anxiety Score 09/22/2020 06/25/2020 01/06/2020 09/05/2019  Nervous, Anxious, on Edge 2 1 0 0  Control/stop worrying 1 1 0 0  Worry too much - different things 1 1 0 0  Trouble relaxing 2 0 0 0  Restless 3 0 0 0  Easily annoyed or irritable 3 1 0 2  Afraid - awful might happen 1 0 0 0  Total GAD 7 Score 13 4 0 2  Anxiety Difficulty Very difficult - - Not  difficult at all    BP Readings from Last 3 Encounters:  05/13/21 120/80  05/07/21 (!) 159/74  03/30/21 122/80    Physical Exam Vitals and nursing note reviewed.  Constitutional:      Appearance: She is well-developed.  HENT:     Head: Normocephalic.     Right Ear: Tympanic membrane, ear canal and external ear normal. There is no impacted cerumen.     Left Ear: Tympanic membrane, ear canal and external ear normal. There is no impacted cerumen.     Nose: Nose normal. No congestion or rhinorrhea.  Eyes:     General: Lids are everted, no foreign bodies appreciated. No scleral icterus.       Left eye: No foreign body or hordeolum.     Conjunctiva/sclera: Conjunctivae normal.     Right eye: Right conjunctiva is not injected.     Left eye: Left conjunctiva is not injected.     Pupils: Pupils are equal, round, and reactive to light.  Neck:     Thyroid: No thyromegaly.     Vascular: No JVD.     Trachea: No tracheal deviation.  Cardiovascular:     Rate and Rhythm: Normal rate and regular rhythm.     Heart sounds: Normal heart sounds. No murmur heard.   No friction rub. No gallop.  Pulmonary:     Effort: Pulmonary effort is normal. No respiratory distress.     Breath sounds: Normal breath sounds. No wheezing, rhonchi or rales.  Abdominal:     General: Bowel sounds are normal.     Palpations: Abdomen is soft. There is no mass.     Tenderness: There is no abdominal tenderness. There is no guarding or rebound.  Musculoskeletal:        General: No tenderness. Normal range of motion.     Cervical back: Normal range of motion and neck supple.  Lymphadenopathy:     Cervical: No cervical adenopathy.  Skin:    General: Skin is warm.     Findings: Erythema present. No rash.     Comments: Nail dystrophic  Neurological:     Mental Status: She is alert and oriented to person, place, and time.  Cranial Nerves: No cranial nerve deficit.     Deep Tendon Reflexes: Reflexes normal.   Psychiatric:        Mood and Affect: Mood is not anxious or depressed.    Wt Readings from Last 3 Encounters:  05/13/21 194 lb (88 kg)  05/06/21 194 lb (88 kg)  03/30/21 193 lb (87.5 kg)    BP 120/80    Pulse 64    Ht 5\' 5"  (1.651 m)    Wt 194 lb (88 kg)    BMI 32.28 kg/m   Assessment and Plan:  1. Cellulitis of finger of left hand New onset.  Persistent.  Stable.  There is erythema of the fifth digit left hand.  This is consistent with an early cellulitis and we will treat with doxycycline 100 mg twice a day. - doxycycline (VIBRA-TABS) 100 MG tablet; Take 1 tablet (100 mg total) by mouth 2 (two) times daily.  Dispense: 20 tablet; Refill: 0  2. Tinea unguium New onset.  Persistent.  There is increased keratinization of the nail with irritation of the nailbed.  This is consistent with a likely tinea but patient has understanding that before medication can be given this needs to be evaluated and perhaps cultured. - Ambulatory referral to Dermatology

## 2021-05-18 DIAGNOSIS — I7121 Aneurysm of the ascending aorta, without rupture: Secondary | ICD-10-CM | POA: Diagnosis not present

## 2021-05-18 DIAGNOSIS — E782 Mixed hyperlipidemia: Secondary | ICD-10-CM | POA: Diagnosis not present

## 2021-05-18 DIAGNOSIS — I480 Paroxysmal atrial fibrillation: Secondary | ICD-10-CM | POA: Diagnosis not present

## 2021-05-18 DIAGNOSIS — R002 Palpitations: Secondary | ICD-10-CM | POA: Diagnosis not present

## 2021-05-18 DIAGNOSIS — R600 Localized edema: Secondary | ICD-10-CM | POA: Diagnosis not present

## 2021-05-18 DIAGNOSIS — I10 Essential (primary) hypertension: Secondary | ICD-10-CM | POA: Diagnosis not present

## 2021-05-21 ENCOUNTER — Other Ambulatory Visit: Payer: Self-pay

## 2021-05-21 ENCOUNTER — Ambulatory Visit: Payer: Self-pay

## 2021-05-21 DIAGNOSIS — R051 Acute cough: Secondary | ICD-10-CM

## 2021-05-21 MED ORDER — BENZONATATE 100 MG PO CAPS: 100.0000 mg | ORAL_CAPSULE | Freq: Two times a day (BID) | ORAL | 0 refills | Status: DC | PRN

## 2021-05-21 NOTE — Telephone Encounter (Signed)
Summary: cough / rx request  ? The patient has experienced a cough for for roughly a week  ? ?The patient shares that they've previously tried cough medicine but it was ineffective  ? ?The patient would like to be prescribed something for their discomfort  ? ?Please contact further when possible   ?  ? ?Chief Complaint: Dry cough for over 1 week. Has tried Mucinex and Robitussin ?Symptoms: No other symptoms ?Frequency: Started over 1 week ago  ?Pertinent Negatives: Patient denies fever or other symptoms ?Disposition: '[]'$ ED /'[]'$ Urgent Care (no appt availability in office) / '[]'$ Appointment(In office/virtual)/ '[]'$  [facility]/ '[]'$ Home Care/ '[]'$ Refused Recommended Disposition /'[]'$  Mobile Bus/ '[]'$  Follow-up with PCP ?Additional Notes: Declines appointment. Asking for cough Supressant to be called in. Please advise pt.  ?Answer Assessment - Initial Assessment Questions ?1. ONSET: "When did the cough begin?"  ?    Over 1 week ago ?2. SEVERITY: "How bad is the cough today?"  ?    Sever ?3. SPUTUM: "Describe the color of your sputum" (none, dry cough; clear, white, yellow, green) ?    None ?4. HEMOPTYSIS: "Are you coughing up any blood?" If so ask: "How much?" (flecks, streaks, tablespoons, etc.) ?    No ?5. DIFFICULTY BREATHING: "Are you having difficulty breathing?" If Yes, ask: "How bad is it?" (e.g., mild, moderate, severe)  ?  - MILD: No SOB at rest, mild SOB with walking, speaks normally in sentences, can lie down, no retractions, pulse < 100.  ?  - MODERATE: SOB at rest, SOB with minimal exertion and prefers to sit, cannot lie down flat, speaks in phrases, mild retractions, audible wheezing, pulse 100-120.  ?  - SEVERE: Very SOB at rest, speaks in single words, struggling to breathe, sitting hunched forward, retractions, pulse > 120  ?    No ?6. FEVER: "Do you have a fever?" If Yes, ask: "What is your temperature, how was it measured, and when did it start?" ?    No ?7. CARDIAC HISTORY: "Do you have  any history of heart disease?" (e.g., heart attack, congestive heart failure)  ?    No ?8. LUNG HISTORY: "Do you have any history of lung disease?"  (e.g., pulmonary embolus, asthma, emphysema) ?    No ?9. PE RISK FACTORS: "Do you have a history of blood clots?" (or: recent major surgery, recent prolonged travel, bedridden) ?    No ?10. OTHER SYMPTOMS: "Do you have any other symptoms?" (e.g., runny nose, wheezing, chest pain) ?      No ?11. PREGNANCY: "Is there any chance you are pregnant?" "When was your last menstrual period?" ?      No ?12. TRAVEL: "Have you traveled out of the country in the last month?" (e.g., travel history, exposures) ?      No ? ?Protocols used: Cough - Acute Non-Productive-A-AH ? ?

## 2021-05-21 NOTE — Progress Notes (Signed)
Sent in tessalon perles

## 2021-05-31 DIAGNOSIS — E039 Hypothyroidism, unspecified: Secondary | ICD-10-CM | POA: Diagnosis not present

## 2021-05-31 DIAGNOSIS — I4891 Unspecified atrial fibrillation: Secondary | ICD-10-CM | POA: Diagnosis not present

## 2021-06-28 DIAGNOSIS — Z87891 Personal history of nicotine dependence: Secondary | ICD-10-CM | POA: Diagnosis not present

## 2021-06-28 DIAGNOSIS — Z6832 Body mass index (BMI) 32.0-32.9, adult: Secondary | ICD-10-CM | POA: Diagnosis not present

## 2021-06-28 DIAGNOSIS — I10 Essential (primary) hypertension: Secondary | ICD-10-CM | POA: Diagnosis not present

## 2021-06-28 DIAGNOSIS — E669 Obesity, unspecified: Secondary | ICD-10-CM | POA: Diagnosis not present

## 2021-06-28 DIAGNOSIS — R32 Unspecified urinary incontinence: Secondary | ICD-10-CM | POA: Diagnosis not present

## 2021-06-28 DIAGNOSIS — R69 Illness, unspecified: Secondary | ICD-10-CM | POA: Diagnosis not present

## 2021-06-28 DIAGNOSIS — I25119 Atherosclerotic heart disease of native coronary artery with unspecified angina pectoris: Secondary | ICD-10-CM | POA: Diagnosis not present

## 2021-06-28 DIAGNOSIS — E039 Hypothyroidism, unspecified: Secondary | ICD-10-CM | POA: Diagnosis not present

## 2021-06-28 DIAGNOSIS — K219 Gastro-esophageal reflux disease without esophagitis: Secondary | ICD-10-CM | POA: Diagnosis not present

## 2021-06-28 DIAGNOSIS — Z008 Encounter for other general examination: Secondary | ICD-10-CM | POA: Diagnosis not present

## 2021-06-28 DIAGNOSIS — M1990 Unspecified osteoarthritis, unspecified site: Secondary | ICD-10-CM | POA: Diagnosis not present

## 2021-06-29 ENCOUNTER — Other Ambulatory Visit: Payer: Self-pay | Admitting: Gastroenterology

## 2021-06-29 DIAGNOSIS — K862 Cyst of pancreas: Secondary | ICD-10-CM | POA: Diagnosis not present

## 2021-06-29 DIAGNOSIS — K7460 Unspecified cirrhosis of liver: Secondary | ICD-10-CM

## 2021-06-29 DIAGNOSIS — K219 Gastro-esophageal reflux disease without esophagitis: Secondary | ICD-10-CM | POA: Diagnosis not present

## 2021-06-29 DIAGNOSIS — K8681 Exocrine pancreatic insufficiency: Secondary | ICD-10-CM | POA: Diagnosis not present

## 2021-07-08 ENCOUNTER — Ambulatory Visit: Payer: Medicare HMO | Admitting: Dermatology

## 2021-07-08 DIAGNOSIS — L609 Nail disorder, unspecified: Secondary | ICD-10-CM

## 2021-07-08 DIAGNOSIS — L570 Actinic keratosis: Secondary | ICD-10-CM | POA: Diagnosis not present

## 2021-07-08 DIAGNOSIS — L578 Other skin changes due to chronic exposure to nonionizing radiation: Secondary | ICD-10-CM

## 2021-07-08 DIAGNOSIS — L720 Epidermal cyst: Secondary | ICD-10-CM

## 2021-07-08 NOTE — Patient Instructions (Addendum)
? ?Pre-Operative Instructions ? ?You are scheduled for a surgical procedure at [facility]. We recommend you read the following instructions. If you have any questions or concerns, please call the office at [phone]. ? ?Shower and wash the entire body with soap and water the day of your surgery paying special attention to cleansing at and around the planned surgery site. ? ?Avoid aspirin or aspirin containing products at least fourteen (14) days prior to your surgical procedure and for at least one week (7 Days) after your surgical procedure. If you take aspirin on a regular basis for heart disease or history of stroke or for any other reason, we may recommend you continue taking aspirin but please notify us if you take this on a regular basis. Aspirin can cause more bleeding to occur during surgery as well as prolonged bleeding and bruising after surgery.  ? ?Avoid other nonsteroidal pain medications at least one week prior to surgery and at least one week prior to your surgery. These include medications such as Ibuprofen (Motrin, Advil and Nuprin), Naprosyn, Voltaren, Relafen, etc. If medications are used for therapeutic reasons, please inform us as they can cause increased bleeding or prolonged bleeding during and bruising after surgical procedures.  ? ?Please advise [place] if you are taking any "blood thinner" medications such as Coumadin or Dipyridamole or Plavix or similar medications. These cause increased bleeding and prolonged bleeding during procedures and bruising after surgical procedures. We may have to consider discontinuing these medications briefly prior to and shortly after your surgery if safe to do so.  ? ?Please inform us of all medications you are currently taking. All medications that are taken regularly should be taken the day of surgery as you always do. Nevertheless, we need to be informed of what medications you are taking prior to surgery to know whether they will affect the  procedure or cause any complications.  ? ?Please inform us of any medication allergies. Also inform us of whether you have allergies to Latex or rubber products or whether you have had any adverse reaction to Lidocaine or Epinephrine. ? ?Please inform us of any prosthetic or artificial body parts such as artificial heart valve, joint replacements, etc., or similar condition that might require preoperative antibiotics.  ? ?We recommend avoidance of alcohol at least two weeks prior to surgery and continued avoidance for at least two weeks after surgery.  ? ?We recommend discontinuation of tobacco smoking at least two weeks prior to surgery and continued abstinence for at least two weeks after surgery. ? ?Do not plan strenuous exercise, strenuous work or strenuous lifting for approximately four weeks after your surgery.  ? ?We request if you are unable to make your scheduled surgical appointment, please call us at least a week in advance or as soon as you are aware of a problem so that we can cancel or reschedule the appointment.  ? ?You MAY TAKE TYLENOL (acetaminophen) for pain as it is not a blood thinner.  ? ?PLEASE PLAN TO BE IN TOWN FOR TWO WEEKS FOLLOWING SURGERY, THIS IS IMPORTANT SO YOU CAN BE CHECKED FOR DRESSING CHANGES, SUTURE REMOVAL AND TO MONITOR FOR POSSIBLE COMPLICATIONS.  ? ? ? ?If You Need Anything After Your Visit ? ?If you have any questions or concerns for your doctor, please call our main line at [phone] and press option 4 to reach your doctor's medical assistant. If no one answers, please leave a voicemail as directed and we will return your call as  soon as possible. Messages left after 4 pm will be answered the following business day.  ? ?You may also send [place] a message via MyChart. We typically respond to MyChart messages within 1-2 business days. ? ?For prescription refills, please ask your pharmacy to contact our office. Our fax number is [phone]. ? ?If you have an urgent issue when  the clinic is closed that cannot wait until the next business day, you can page your doctor at the number below.   ? ?Please note that while we do our best to be available for urgent issues outside of office hours, we are not available 24/7.  ? ?If you have an urgent issue and are unable to reach [place], you may choose to seek medical care at your doctor's office, retail clinic, urgent care center, or emergency room. ? ?If you have a medical emergency, please immediately call 911 or go to the emergency department. ? ?Pager Numbers ? ?- Dr. Nehemiah Massed: [phone] ? ?- Dr. Laurence Ferrari: [phone] ? ?- Dr. Nicole Kindred: [phone] ? ?In the event of inclement weather, please call our main line at [phone] for an update on the status of any delays or closures. ? ?Dermatology Medication Tips: ?Please keep the boxes that topical medications come in in order to help keep track of the instructions about where and how to use these. Pharmacies typically print the medication instructions only on the boxes and not directly on the medication tubes.  ? ?If your medication is too expensive, please contact our office at [phone] option 4 or send [place] a message through [place].  ? ?We are unable to tell what your co-pay for medications will be in advance as this is different depending on your insurance coverage. However, we may be able to find a substitute medication at lower cost or fill out paperwork to get insurance to cover a needed medication.  ? ?If a prior authorization is required to get your medication covered by your insurance company, please allow [place] 1-2 business days to complete this process. ? ?Drug prices often vary depending on where the prescription is filled and some pharmacies may offer cheaper prices. ? ?The website www.goodrx.com contains coupons for medications through different pharmacies. The prices here do not account for what the cost may be with help from insurance (it may be cheaper with your insurance), but the  website can give you the price if you did not use any insurance.  ?- You can print the associated coupon and take it with your prescription to the pharmacy.  ?- You may also stop by our office during regular business hours and pick up a GoodRx coupon card.  ?- If you need your prescription sent electronically to a different pharmacy, notify our office through [facility] or by phone at [phone] option 4. ? ? ? ? ?Si Usted Necesita Algo Despu?s de Su Visita ? ?Tambi?n puede enviarnos un mensaje a trav?s de MyChart. Por lo general respondemos a los mensajes de MyChart en el transcurso de 1 a 2 d?as h?biles. ? ?Para renovar recetas, por favor pida a su farmacia que se ponga en contacto con nuestra oficina. Nuestro n?mero de fax es el [phone]. ? ?Si tiene un asunto urgente cuando la cl?nica est? cerrada y que no puede esperar hasta el siguiente d?a h?bil, puede llamar/localizar a su doctor(a) al n?mero que aparece a continuaci?n.  ? ?Por favor, tenga en cuenta que aunque hacemos todo lo posible para estar disponibles para asuntos urgentes fuera del horario  de oficina, no estamos disponibles las 24 horas del d?a, los 7 d?as de la semana.  ? ?Si tiene un problema urgente y no puede comunicarse con nosotros, puede optar por buscar atenci?n m?dica  en el consultorio de su doctor(a), en una cl?nica privada, en un centro de atenci?n urgente o en una sala de emergencias. ? ?Si tiene Engineer, maintenance (IT) m?dica, por favor llame inmediatamente al 911 o vaya a la sala de emergencias. ? ?N?meros de b?per ? ?- Dr. Nehemiah Massed: [phone] ? ?- Dra. Moye: [phone] ? ?- Dra. Nicole Kindred: [phone] ? ?En caso de inclemencias del tiempo, por favor llame a nuestra l?nea principal al [phone] para una actualizaci?n sobre el estado de cualquier retraso o cierre. ? ?Consejos para la medicaci?n en dermatolog?a: ?Por favor, guarde las cajas en las que vienen los medicamentos de uso t?pico para ayudarle a seguir las  instrucciones sobre d?nde y c?mo usarlos. Las farmacias generalmente imprimen las instrucciones del medicamento s?lo en las cajas y no directamente en los tubos del [place].  ? ?Si su medicamento es [employer],

## 2021-07-08 NOTE — Progress Notes (Signed)
? ?  Follow-Up Visit ?  ?Subjective  ?Kaitlyn Oliver is a 88 y.o. female who presents for the following: Lesion (On the back - no symptoms, she is just concerned about what it may be and would like it checked today. ) and Nail problem (Of the L 5th finger x 2 mths - pt reports no trauma to area. She does get her nails done and thinks she may have a fungal infection. The nail/finger is tender and the finger nail did eventually fall off.). The patient has spots, moles and lesions to be evaluated, some may be new or changing. ? ?The following portions of the chart were reviewed this encounter and updated as appropriate:  ? Tobacco  Allergies  Meds  Problems  Med Hx  Surg Hx  Fam Hx   ?  ?Review of Systems:  No other skin or systemic complaints except as noted in HPI or Assessment and Plan. ? ?Objective  ?Well appearing patient in no apparent distress; mood and affect are within normal limits. ? ?A focused examination was performed including the back, face, and hands. Relevant physical exam findings are noted in the Assessment and Plan. ? ?L 5th finger ? ? ? ? ? ? ?L upper back ?1.0 firm SQ nodule. ? ?R nasal tip x 3 (3) ?Erythematous thin papules/macules with gritty scale.  ? ? ?Assessment & Plan  ?Nail problem - dystrophy ?L 5th finger ?Likely Due to UV cured nails reaction in past ?May be permanent nail dystrophy from nail matrix damage - avoid using UV cured nails or patient will continue to have nail issues which may cause permanent changes in the nails. Ok to have manicures with nail polish.  ?Time will tell if nail grows out.  No treatment will help. ? ?Epidermal inclusion cyst ?L upper back ?Benign-appearing. Exam most consistent with an epidermal inclusion cyst. Discussed that a cyst is a benign growth that can grow over time and sometimes get irritated or inflamed. Recommend observation if it is not bothersome. Discussed option of surgical excision to remove it if it is growing, symptomatic, or other  changes noted. Please call for new or changing lesions so they can be evaluated. ? ?AK (actinic keratosis) (3) ?Right nasal tip x 3 ?(Left nasal tip clear today.) ?Destruction of lesion - R nasal tip x 3 ?Complexity: simple   ?Destruction method: cryotherapy   ?Informed consent: discussed and consent obtained   ?Timeout:  patient name, date of birth, surgical site, and procedure verified ?Lesion destroyed using liquid nitrogen: Yes   ?Region frozen until ice ball extended beyond lesion: Yes   ?Outcome: patient tolerated procedure well with no complications   ?Post-procedure details: wound care instructions given   ? ?Actinic Damage ?- chronic, secondary to cumulative UV radiation exposure/sun exposure over time ?- diffuse scaly erythematous macules with underlying dyspigmentation ?- Recommend daily broad spectrum sunscreen SPF 30+ to sun-exposed areas, reapply every 2 hours as needed.  ?- Recommend staying in the shade or wearing long sleeves, sun glasses (UVA+UVB protection) and wide brim hats (4-inch brim around the entire circumference of the hat). ?- Call for new or changing lesions. ? ?Return for surgery - cyst excision L upper back; 6 mths AK and nail f/u . ? ?IRudell Cobb, CMA, am acting as scribe for Sarina Ser, MD . ?Documentation: I have reviewed the above documentation for accuracy and completeness, and I agree with the above. ? ?Sarina Ser, MD ? ?

## 2021-07-09 ENCOUNTER — Ambulatory Visit: Payer: Medicare HMO

## 2021-07-20 ENCOUNTER — Encounter: Payer: Self-pay | Admitting: Dermatology

## 2021-08-18 DIAGNOSIS — R002 Palpitations: Secondary | ICD-10-CM | POA: Diagnosis not present

## 2021-08-18 DIAGNOSIS — I480 Paroxysmal atrial fibrillation: Secondary | ICD-10-CM | POA: Diagnosis not present

## 2021-08-18 DIAGNOSIS — I25118 Atherosclerotic heart disease of native coronary artery with other forms of angina pectoris: Secondary | ICD-10-CM | POA: Diagnosis not present

## 2021-08-18 DIAGNOSIS — E782 Mixed hyperlipidemia: Secondary | ICD-10-CM | POA: Diagnosis not present

## 2021-08-18 DIAGNOSIS — I10 Essential (primary) hypertension: Secondary | ICD-10-CM | POA: Diagnosis not present

## 2021-08-18 DIAGNOSIS — I7121 Aneurysm of the ascending aorta, without rupture: Secondary | ICD-10-CM | POA: Diagnosis not present

## 2021-09-28 ENCOUNTER — Encounter: Payer: Medicare HMO | Admitting: Dermatology

## 2021-09-29 DIAGNOSIS — Z961 Presence of intraocular lens: Secondary | ICD-10-CM | POA: Diagnosis not present

## 2021-09-29 DIAGNOSIS — H353131 Nonexudative age-related macular degeneration, bilateral, early dry stage: Secondary | ICD-10-CM | POA: Diagnosis not present

## 2021-09-30 ENCOUNTER — Encounter: Payer: Self-pay | Admitting: Cardiology

## 2021-11-16 ENCOUNTER — Other Ambulatory Visit: Payer: Self-pay | Admitting: Family Medicine

## 2021-11-16 ENCOUNTER — Ambulatory Visit: Payer: Self-pay | Admitting: *Deleted

## 2021-11-16 ENCOUNTER — Telehealth: Payer: Self-pay

## 2021-11-16 DIAGNOSIS — F32A Depression, unspecified: Secondary | ICD-10-CM

## 2021-11-16 MED ORDER — ALPRAZOLAM 0.25 MG PO TABS: 0.2500 mg | ORAL_TABLET | Freq: Two times a day (BID) | ORAL | 0 refills | Status: DC | PRN

## 2021-11-16 NOTE — Telephone Encounter (Signed)
Called and left message we will send pt in Alprazolam

## 2021-11-16 NOTE — Telephone Encounter (Signed)
I was to put her in for this coming Thursday but she still says she needs something sent in today.

## 2021-11-16 NOTE — Telephone Encounter (Signed)
Summary: Call back regarding medication call in   Pt called and stated that her dog is sick/she states that she would like some medication called in to keep her from crying. She did sound okay on the phone.      Reason for Disposition . Prescription request for new medicine (not a refill)  Answer Assessment - Initial Assessment Questions 1. CONCERN: "Did anything happen that prompted you to call today?"      Pet has diabetes 2. ANXIETY SYMPTOMS: "Can you describe how you (your loved one; patient) have been feeling?" (e.g., tense, restless, panicky, anxious, keyed up, overwhelmed, sense of impending doom).      Pet newly diagnosed- additional medication 3. ONSET: "How long have you been feeling this way?" (e.g., hours, days, weeks)     Dog is 86 years old- diagnosed 1 month ago 4. SEVERITY: "How would you rate the level of anxiety?" (e.g., 0 - 10; or mild, moderate, severe).     severe 5. FUNCTIONAL IMPAIRMENT: "How have these feelings affected your ability to do daily activities?" "Have you had more difficulty than usual doing your normal daily activities?" (e.g., getting better, same, worse; self-care, school, work, interactions)     Patient is crying at times and is requesting 6. HISTORY: "Have you felt this way before?" "Have you ever been diagnosed with an anxiety problem in the past?" (e.g., generalized anxiety disorder, panic attacks, PTSD). If Yes, ask: "How was this problem treated?" (e.g., medicines, counseling, etc.)       7. RISK OF HARM - SUICIDAL IDEATION: "Do you ever have thoughts of hurting or killing yourself?" If Yes, ask:  "Do you have these feelings now?" "Do you have a plan on how you would do this?"     No- no plan 8. TREATMENT:  "What has been done so far to treat this anxiety?" (e.g., medicines, relaxation strategies). "What has helped?"       9. TREATMENT - THERAPIST: "Do you have a counselor or therapist? Name?"       10. POTENTIAL TRIGGERS: "Do you drink  caffeinated beverages (e.g., coffee, colas, teas), and how much daily?" "Do you drink alcohol or use any drugs?" "Have you started any new medicines recently?"       *No Answer* 11. PATIENT SUPPORT: "Who is with you now?" "Who do you live with?" "Do you have family or friends who you can talk to?"        *No Answer* 12. OTHER SYMPTOMS: "Do you have any other symptoms?" (e.g., feeling depressed, trouble concentrating, trouble sleeping, trouble breathing, palpitations or fast heartbeat, chest pain, sweating, nausea, or diarrhea)       *No Answer* 13. PREGNANCY: "Is there any chance you are pregnant?" "When was your last menstrual period?"       *No Answer*  Answer Assessment - Initial Assessment Questions 1. NAME of MEDICINE: "What medicine(s) are you calling about?"     Patient is requesting something for her nerves 2. QUESTION: "What is your question?" (e.g., double dose of medicine, side effect)     Patient states her dog is sick and is requesting something to help with her nerves 3. PRESCRIBER: "Who prescribed the medicine?" Reason: if prescribed by specialist, call should be referred to that group.     PCP 4. SYMPTOMS: "Do you have any symptoms?" If Yes, ask: "What symptoms are you having?"  "How bad are the symptoms (e.g., mild, moderate, severe)     Crying spells  Protocols used: Anxiety  and Panic Attack-A-AH, Medication Question Call-A-AH

## 2021-11-16 NOTE — Telephone Encounter (Signed)
  Chief Complaint: requesting medication- help with nerves- sick pet Symptoms: crying spells Frequency: newly diagnosed diabetes and other health problems Pertinent Negatives: Patient denies suicidal thoughts Disposition: '[]'$ ED /'[]'$ Urgent Care (no appt availability in office) / '[]'$ Appointment(In office/virtual)/ '[]'$  [facility]/ '[]'$ Home Care/ '[]'$ Refused Recommended Disposition /'[]'$ [facility]/ '[x]'$  Follow-up with PCP Additional Notes: Patient's [place]altese dog is sick and she is requesting medication to help with her nerves. Patient states she is having crying spells and her daughter told her to call. Patient states her PCP has given her something in the past

## 2021-11-18 ENCOUNTER — Ambulatory Visit: Payer: Medicare HMO | Admitting: Family Medicine

## 2021-11-23 ENCOUNTER — Ambulatory Visit (INDEPENDENT_AMBULATORY_CARE_PROVIDER_SITE_OTHER): Payer: Medicare HMO | Admitting: Family Medicine

## 2021-11-23 ENCOUNTER — Encounter: Payer: Self-pay | Admitting: Family Medicine

## 2021-11-23 VITALS — BP 124/80 | HR 68 | Ht 65.0 in | Wt 195.0 lb

## 2021-11-23 DIAGNOSIS — F419 Anxiety disorder, unspecified: Secondary | ICD-10-CM

## 2021-11-23 DIAGNOSIS — Z23 Encounter for immunization: Secondary | ICD-10-CM

## 2021-11-23 DIAGNOSIS — F32A Depression, unspecified: Secondary | ICD-10-CM | POA: Diagnosis not present

## 2021-11-23 DIAGNOSIS — R69 Illness, unspecified: Secondary | ICD-10-CM | POA: Diagnosis not present

## 2021-11-23 MED ORDER — SERTRALINE HCL 25 MG PO TABS: 25.0000 mg | ORAL_TABLET | Freq: Every day | ORAL | 1 refills | Status: DC

## 2021-11-23 NOTE — Progress Notes (Signed)
Date:  11/23/2021   Name:  Kaitlyn Oliver   DOB:  01/27/1934   MRN:  [id number]   Chief Complaint: Depression, Anxiety, and Flu Vaccine  Depression        This is a chronic problem.  The current episode started more than 1 year ago.   The onset quality is gradual.   The problem occurs intermittently.  The problem has been gradually improving since onset.  Associated symptoms include no decreased concentration, no fatigue, no helplessness, no hopelessness, does not have insomnia, not irritable, no restlessness, no decreased interest, no appetite change, no body aches, no myalgias, no headaches, no indigestion, not sad and no suicidal ideas.  Past treatments include SSRIs - Selective serotonin reuptake inhibitors.  Compliance with treatment is good.  Previous treatment provided mild relief.  Past medical history includes anxiety.   Anxiety Presents for follow-up visit. Patient reports no chest pain, decreased concentration, dizziness, dry mouth, excessive worry, insomnia, irritability, nausea, nervous/anxious behavior, palpitations, panic, restlessness, shortness of breath or suicidal ideas. Symptoms occur rarely. The severity of symptoms is mild.      Lab Results  Component Value Date   NA 139 11/19/2020   K 3.2 (L) 11/19/2020   CO2 23 11/19/2020   GLUCOSE 125 (H) 11/19/2020   BUN 14 11/19/2020   CREATININE 0.78 11/19/2020   CALCIUM 9.4 11/19/2020   GFRNONAA >60 11/19/2020   Lab Results  Component Value Date   CHOL 227 (H) 02/15/2017   HDL 33 (L) 02/15/2017   LDLCALC Comment 02/15/2017   TRIG 534 (H) 02/15/2017   CHOLHDL 6.9 (H) 02/15/2017   Lab Results  Component Value Date   TSH 6.222 (H) 11/19/2020   Lab Results  Component Value Date   HGBA1C 5.6 01/01/2020   Lab Results  Component Value Date   WBC 5.1 11/19/2020   HGB 14.1 11/19/2020   HCT 39.8 11/19/2020   MCV 89.8 11/19/2020   PLT 142 (L) 11/19/2020   Lab Results  Component Value Date   ALT 30  07/27/2020   AST 36 07/27/2020   ALKPHOS 59 07/27/2020   BILITOT 0.8 07/27/2020   No results found for: "25OHVITD2", "25OHVITD3", "VD25OH"   Review of Systems  Constitutional:  Negative for appetite change, chills, fatigue, fever and irritability.  HENT:  Negative for drooling, ear discharge, ear pain and sore throat.   Respiratory:  Negative for cough, shortness of breath and wheezing.   Cardiovascular:  Negative for chest pain, palpitations and leg swelling.  Gastrointestinal:  Negative for abdominal pain, blood in stool, constipation, diarrhea and nausea.  Endocrine: Negative for polydipsia.  Genitourinary:  Negative for dysuria, frequency, hematuria and urgency.  Musculoskeletal:  Negative for back pain, myalgias and neck pain.  Skin:  Negative for rash.  Allergic/Immunologic: Negative for environmental allergies.  Neurological:  Negative for dizziness and headaches.  Hematological:  Does not bruise/bleed easily.  Psychiatric/Behavioral:  Positive for depression. Negative for decreased concentration and suicidal ideas. The patient is not nervous/anxious and does not have insomnia.     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   Goals of care, counseling/discussion 01/26/2018   Small bowel tumor    Pancreatic cyst 01/15/2018   Anxiety and depression 01/15/2018   Bowel obstruction ([place]) 11/29/2017   Small bowel obstruction ([facility])    Thoracoabdominal aortic aneurysm (TAAA) ([place]) 11/19/2017   Status post total knee replacement using cement,  right 05/30/2017   GERD (gastroesophageal reflux disease) 12/22/2015   Adenomatous polyp 09/11/2014   Bilateral cataracts 09/11/2014   Gastric catarrh 09/11/2014   Gastroduodenal ulcer 09/11/2014   Hypothyroidism 09/11/2014   Cardiac murmur 10/08/2013   Accelerated hypertension 10/08/2013   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 Paper tape ok    Amlodipine Swelling    Throat swelling   Asa [Aspirin] Itching   Hydralazine Other (See Comments)    Oliguria (low urine output)   Hydrochlorothiazide Other (See Comments)    Hypokalemia leading to AFib   Penicillin G Itching    Has patient had a PCN reaction causing immediate rash, facial/tongue/throat swelling, SOB or lightheadedness with hypotension: No Has patient had a PCN reaction causing severe rash involving mucus membranes or skin necrosis: No Has patient had a PCN reaction that required hospitalization: No Has patient had a PCN reaction occurring within the last 10 years: No If all of the above answers are "NO", then may proceed with Cephalosporin use.    Hydrocodone-Acetaminophen Itching and Rash    Past Surgical History:  Procedure Laterality Date   BOWEL RESECTION  01/17/2018   Procedure: SMALL BOWEL RESECTION;  Surgeon: Jules Husbands, MD;  Location: [facility];  Service: General;;   BREAST SURGERY Bilateral 1991   mastectomy   CATARACT EXTRACTION Bilateral    CHOLECYSTECTOMY     COLONOSCOPY  2013   Dr Gustavo Lah   CORONARY STENT INTERVENTION N/A 05/06/2021   Procedure: CORONARY STENT INTERVENTION;  Surgeon: Isaias Cowman, MD;  Location: [facility];  Service: Cardiovascular;  Laterality: N/A;   HALLUX VALGUS AUSTIN  Right 06/03/2015   Procedure: HALLUX VALGUS AUSTIN;  Surgeon: Samara Deist, DPM;  Location: [facility];  Service: Podiatry;  Laterality: Right;  WITH POPLITEAL   KNEE ARTHROSCOPY WITH MEDIAL MENISECTOMY Right 08/30/2016   Procedure: KNEE ARTHROSCOPY WITH PARTIAL MEDIAL MENISECTOMY and Partial Lateral Menisectomy;  Surgeon: Hessie Knows, MD;  Location: [facility];  Service: Orthopedics;  Laterality: Right;   KNEE ARTHROSCOPY WITH SUBCHONDROPLASTY Right 08/30/2016   Procedure: KNEE ARTHROSCOPY WITH SUBCHONDROPLASTY;  Surgeon: Hessie Knows, MD;  Location: [facility];  Service: Orthopedics;  Laterality: Right;   LAPAROTOMY N/A 01/17/2018   Procedure: EXPLORATORY LAPAROTOMY;  Surgeon: Jules Husbands, MD;  Location: [facility];  Service: General;  Laterality: N/A;   LEFT HEART CATH AND CORONARY ANGIOGRAPHY Left 05/06/2021   Procedure: LEFT HEART CATH AND CORONARY ANGIOGRAPHY;  Surgeon: Isaias Cowman, MD;  Location: [facility];  Service: Cardiovascular;  Laterality: Left;   MASTECTOMY Bilateral    ROTATOR CUFF REPAIR Bilateral    TONSILLECTOMY     TOTAL KNEE ARTHROPLASTY Left    TOTAL KNEE ARTHROPLASTY Right 05/30/2017   Procedure: TOTAL KNEE ARTHROPLASTY;  Surgeon: Hessie Knows,  MD;  Location: [facility];  Service: Orthopedics;  Laterality: Right;   VAGINAL HYSTERECTOMY      Social History   Tobacco Use   Smoking status: Former    Packs/day: 0.25    Years: 10.00    Total pack years: 2.50    Types: Cigarettes    Quit date: 1991    Years since quitting: 32.7   Smokeless tobacco: Never   Tobacco comments:    smoking cessation materials not required  Vaping Use   Vaping Use: Never used  Substance Use Topics   Alcohol use: No    Alcohol/week: 0.0 standard drinks of alcohol   Drug use: No     Medication list has been reviewed and updated.  Current Meds  Medication Sig   ALPRAZolam (XANAX) 0.25 MG tablet Take 1 tablet (0.25 mg total) by mouth 2 (two) times daily as  needed for anxiety.   clopidogrel (PLAVIX) 75 MG tablet Take 1 tablet (75 mg total) by mouth daily with breakfast.   EUTHYROX 50 MCG tablet Take 50 mcg by mouth daily before breakfast. Dr Honor Junes   isosorbide mononitrate (IMDUR) 60 MG 24 hr tablet Take 1 tablet (60 mg total) by mouth daily.   metoprolol succinate (TOPROL-XL) 25 MG 24 hr tablet Take 1 tablet (25 mg total) by mouth daily.   omeprazole (PRILOSEC) 20 MG capsule Take 1 capsule (20 mg total) by mouth daily.   sertraline (ZOLOFT) 25 MG tablet Take 1 tablet (25 mg total) by mouth daily.       11/23/2021    2:20 PM 09/22/2020    1:39 PM 06/25/2020    3:24 PM 01/06/2020    2:56 PM  GAD 7 : Generalized Anxiety Score  Nervous, Anxious, on Edge 0 2 1 0  Control/stop worrying 0 1 1 0  Worry too much - different things 0 1 1 0  Trouble relaxing 0 2 0 0  Restless 0 3 0 0  Easily annoyed or irritable 0 3 1 0  Afraid - awful might happen 0 1 0 0  Total GAD 7 Score 0 13 4 0  Anxiety Difficulty Not difficult at all Very difficult         11/23/2021    2:19 PM 05/13/2021    9:53 AM 12/07/2020    8:21 AM  Depression screen PHQ 2/9  Decreased Interest 0 0 0  Down, Depressed, Hopeless 1 0 0  PHQ - 2 Score 1 0 0  Altered sleeping 0 0   Tired, decreased energy 0 0   Change in appetite 0 2   Feeling bad or failure about yourself  0 0   Trouble concentrating 0    Moving slowly or fidgety/restless 0    Suicidal thoughts 0    PHQ-9 Score 1 2   Difficult doing work/chores Not difficult at all      BP Readings from Last 3 Encounters:  11/23/21 124/80  05/13/21 120/80  05/07/21 (!) 159/74    Physical Exam Vitals and nursing note reviewed. Exam conducted with a chaperone present.  Constitutional:      General: She is not irritable.She is not in acute distress.    Appearance: She is not diaphoretic.  HENT:     Head: Normocephalic and atraumatic.     Right Ear: External ear normal.     Left Ear: External ear normal.     Nose:  Nose normal. No congestion or rhinorrhea.     Mouth/Throat:  Mouth: Mucous membranes are moist.     Pharynx: No oropharyngeal exudate or posterior oropharyngeal erythema.  Eyes:     General:        Right eye: No discharge.        Left eye: No discharge.     Conjunctiva/sclera: Conjunctivae normal.     Pupils: Pupils are equal, round, and reactive to light.  Neck:     Thyroid: No thyromegaly.     Vascular: No carotid bruit or JVD.  Cardiovascular:     Rate and Rhythm: Normal rate and regular rhythm.     Heart sounds: Normal heart sounds. No murmur heard.    No friction rub. No gallop.  Pulmonary:     Effort: Pulmonary effort is normal.     Breath sounds: Normal breath sounds. No wheezing or rhonchi.  Abdominal:     General: Bowel sounds are normal.     Palpations: Abdomen is soft. There is no mass.     Tenderness: There is no abdominal tenderness. There is no guarding or rebound.  Musculoskeletal:        General: Normal range of motion.     Cervical back: Normal range of motion and neck supple.  Lymphadenopathy:     Cervical: No cervical adenopathy.  Skin:    General: Skin is warm and dry.  Neurological:     Mental Status: She is alert.     Deep Tendon Reflexes: Reflexes are normal and symmetric.     Wt Readings from Last 3 Encounters:  11/23/21 195 lb (88.5 kg)  05/13/21 194 lb (88 kg)  05/06/21 194 lb (88 kg)    BP 124/80   Pulse 68   Ht '5\' 5"'$  (1.651 m)   Wt 195 lb (88.5 kg)   BMI 32.45 kg/m   Assessment and Plan:  1. Anxiety and depression Chronic.  Controlled.  Stable.  PHQ is 1.  GAD score is 0.  Continue sertraline 25 mg once a day.  We will recheck patient in 6 months.  2. Need for immunization against influenza Discussed and administered. - Flu Vaccine QUAD High Dose(Fluad)    Otilio Miu, MD

## 2021-12-07 NOTE — Progress Notes (Unsigned)
Subjective:   Kaitlyn Oliver is a 88 y.o. female who presents for Medicare Annual (Subsequent) preventive examination.  I connected with  Kaitlyn Oliver on 12/08/21 by a audio enabled telemedicine application and verified that I am speaking with the correct person using two identifiers.  Patient Location: Home  Provider Location: Office/Clinic  I discussed the limitations of evaluation and management by telemedicine. The patient expressed understanding and agreed to proceed.   Review of Systems    Defer to PCP Cardiac Risk Factors include: advanced age (>49men, >80 women)     Objective:    There were no vitals filed for this visit. There is no height or weight on file to calculate BMI.     12/08/2021    8:06 AM 05/06/2021    7:20 AM 12/07/2020    8:22 AM 11/19/2020    5:31 AM 07/27/2020    9:25 PM 01/01/2020    8:29 PM 01/01/2020    4:35 AM  Advanced Directives  Does Patient Have a Medical Advance Directive? Yes No Yes No Yes  No  Type of Advance Directive Living will  [place];Living will  [place];Living will    Copy of [place] in Chart?   No - copy requested      Would patient like information on creating a medical advance directive?  No - Patient declined  Yes (ED - Information included in AVS)  No - Patient declined     Current Medications (verified) Outpatient Encounter Medications as of 12/08/2021  Medication Sig   ALPRAZolam (XANAX) 0.25 MG tablet Take 1 tablet (0.25 mg total) by mouth 2 (two) times daily as needed for anxiety.   clopidogrel (PLAVIX) 75 MG tablet Take 1 tablet (75 mg total) by mouth daily with breakfast.   EUTHYROX 50 MCG tablet Take 50 mcg by mouth daily before breakfast. Dr Honor Junes   isosorbide mononitrate (IMDUR) 60 MG 24 hr tablet Take 1 tablet (60 mg total) by mouth daily.   metoprolol succinate (TOPROL-XL) 25 MG 24 hr tablet Take 1 tablet (25 mg total) by mouth daily.    omeprazole (PRILOSEC) 20 MG capsule Take 1 capsule (20 mg total) by mouth daily.   sertraline (ZOLOFT) 25 MG tablet Take 1 tablet (25 mg total) by mouth daily.   No facility-administered encounter medications on file as of 12/08/2021.    Allergies (verified) Nitroglycerin, Nitroglycerin in d5w, Valsartan, Adhesive [tape], Amlodipine, Asa [aspirin], Hydralazine, Hydrochlorothiazide, Penicillin g, and Hydrocodone-acetaminophen   History: Past Medical History:  Diagnosis Date   Anxiety    Arthritis    thumbs, hands   Atrial fibrillation (HCC)    Cancer ([place]) 1991   bilateral breast   Depression    GERD (gastroesophageal reflux disease)    Headache    occasional - pinched nerve in neck   Hypertension    Hypothyroidism    Thyroid disease    Wears dentures    full upper, partial lower   Past Surgical History:  Procedure Laterality Date   BOWEL RESECTION  01/17/2018   Procedure: SMALL BOWEL RESECTION;  Surgeon: Jules Husbands, MD;  Location: [facility];  Service: General;;   BREAST SURGERY Bilateral 1991   mastectomy   CATARACT EXTRACTION Bilateral    CHOLECYSTECTOMY     COLONOSCOPY  2013   Dr Gustavo Lah   CORONARY STENT INTERVENTION N/A 05/06/2021   Procedure: CORONARY STENT INTERVENTION;  Surgeon: Isaias Cowman, MD;  Location: [facility];  Service: Cardiovascular;  Laterality: N/A;   HALLUX VALGUS AUSTIN Right 06/03/2015   Procedure: HALLUX VALGUS AUSTIN;  Surgeon: Samara Deist, DPM;  Location: [facility];  Service: Podiatry;  Laterality: Right;  WITH POPLITEAL   KNEE ARTHROSCOPY WITH MEDIAL MENISECTOMY Right 08/30/2016   Procedure: KNEE ARTHROSCOPY WITH PARTIAL MEDIAL MENISECTOMY and Partial Lateral Menisectomy;  Surgeon: Hessie Knows, MD;  Location: [facility];  Service: Orthopedics;  Laterality: Right;   KNEE ARTHROSCOPY WITH SUBCHONDROPLASTY Right 08/30/2016   Procedure: KNEE ARTHROSCOPY WITH SUBCHONDROPLASTY;  Surgeon: Hessie Knows, MD;  Location: [facility];  Service: Orthopedics;  Laterality: Right;   LAPAROTOMY N/A 01/17/2018   Procedure: EXPLORATORY LAPAROTOMY;  Surgeon: Jules Husbands, MD;  Location: [facility];  Service: General;  Laterality: N/A;   LEFT HEART CATH AND CORONARY ANGIOGRAPHY Left 05/06/2021   Procedure: LEFT HEART CATH AND CORONARY ANGIOGRAPHY;  Surgeon: Isaias Cowman, MD;  Location: [facility];  Service: Cardiovascular;  Laterality: Left;   MASTECTOMY Bilateral    ROTATOR CUFF REPAIR Bilateral    TONSILLECTOMY     TOTAL KNEE ARTHROPLASTY Left    TOTAL KNEE ARTHROPLASTY Right 05/30/2017   Procedure: TOTAL KNEE ARTHROPLASTY;  Surgeon: Hessie Knows, MD;  Location: [facility];  Service: Orthopedics;  Laterality: Right;   VAGINAL HYSTERECTOMY     Family History  Problem Relation Age of Onset   Pulmonary embolism Mother    Healthy Father    Social History   Socioeconomic History   Marital status: Widowed    Spouse name: Not on file   Number of children: 1   Years of education: Not on file   Highest education level: 12th grade  Occupational History   Occupation: Retired  Tobacco Use   Smoking status: Former    Packs/day: 0.25    Years: 10.00    Total pack years: 2.50    Types: Cigarettes    Quit date: 1991    Years since quitting: 32.7   Smokeless tobacco: Never   Tobacco comments:    smoking cessation materials not required  Vaping Use   Vaping Use: Never used  Substance and Sexual Activity   Alcohol use: No    Alcohol/week: 0.0 standard drinks of alcohol   Drug use: No   Sexual activity: Not Currently  Other Topics Concern   Not on file  Social History Narrative   Not on file   Social Determinants of Health   Financial Resource Strain: Low Risk  (12/08/2021)   Overall Financial Resource Strain (CARDIA)    Difficulty of Paying Living Expenses: Not hard at all  Food Insecurity: No Food Insecurity (12/08/2021)   Hunger Vital Sign    Worried About Running Out of Food in the Last Year:  Never true    [place] in the Last Year: Never true  Transportation Needs: No Transportation Needs (12/08/2021)   PRAPARE - Hydrologist (Medical): No    Lack of Transportation (Non-Medical): No  Physical Activity: Inactive (12/08/2021)   Exercise Vital Sign    Days of Exercise per Week: 0 days    Minutes of Exercise per Session: 0 min  Stress: No Stress Concern Present (12/08/2021)   [place]    Feeling of Stress : Not at all  Social Connections: Moderately Isolated (12/08/2021)   Social Connection and Isolation Panel [NHANES]    Frequency of Communication with Friends and Family:  More than three times a week    Frequency of Social Gatherings with Friends and Family: More than three times a week    Attends Religious Services: More than 4 times per year    Active Member of Clubs or Organizations: No    Attends Archivist Meetings: Never    Marital Status: Widowed    Tobacco Counseling Counseling given: Not Answered Tobacco comments: smoking cessation materials not required   Clinical Intake:  Pre-visit preparation completed: Yes  Pain : No/denies pain     Nutritional Risks: None Diabetes: No     Diabetic? No.     Information entered by :: Wyatt Haste, [place] of Daily Living    12/08/2021    8:08 AM 05/06/2021    6:52 AM  In your present state of health, do you have any difficulty performing the following activities:  Hearing? 0 0  Vision? 0 0  Difficulty concentrating or making decisions? 0 0  Walking or climbing stairs? 0 0  Dressing or bathing? 0 0  Doing errands, shopping? 0   Preparing Food and eating ? N   Using the Toilet? N   In the past six months, have you accidently leaked urine? Y   Do you have problems with loss of bowel control? N   Managing your Medications? N   Managing your Finances? N   Housekeeping or managing your  Housekeeping? N     Patient Care Team: Juline Patch, MD as PCP - General (Family Medicine) Ok Edwards, NP as Nurse Practitioner (Gastroenterology) Clabe Seal, PA-C Lonia Farber, MD as Consulting Physician (Internal Medicine)  Indicate any recent Medical Services you may have received from other than [facility] providers in the past year (date may be approximate).     Assessment:   This is a routine wellness examination for Kaitlyn Oliver.  Hearing/Vision screen Hearing Screening - Comments:: No hearing concerns. Vision Screening - Comments:: Wears prescription glasses.  Dietary issues and exercise activities discussed: Current Exercise Habits: The patient does not participate in regular exercise at present   Goals Addressed   None   Depression Screen    12/08/2021    8:07 AM 11/23/2021    2:19 PM 05/13/2021    9:53 AM 12/07/2020    8:21 AM 09/22/2020    1:37 PM 06/25/2020    3:24 PM 01/06/2020    2:55 PM  PHQ 2/9 Scores  PHQ - 2 Score 2 1 0 0 6 2 0  PHQ- 9 Score _0 Fall Risk    12/08/2021    8:07 AM 11/23/2021    2:18 PM 05/13/2021    9:53 AM 12/07/2020    8:24 AM 06/25/2020    3:23 PM  Fall Risk   Falls in the past year? 0 0 0 0 0  Number falls in past yr: 0 0 0 0   Injury with Fall? 0 0 0 0   Risk for fall due to : No Fall Risks No Fall Risks No Fall Risks No Fall Risks   Follow up Falls evaluation completed Falls evaluation completed Falls evaluation completed Falls prevention discussed Falls evaluation completed    [place]:  Any stairs in or around the home? Yes  If so, are there any without handrails? Yes  Home free of loose throw rugs in walkways, pet beds, electrical cords, etc? Yes  Adequate lighting in  your home to reduce risk of falls? Yes   ASSISTIVE DEVICES UTILIZED TO PREVENT FALLS:  Life alert? No  Use of a cane, walker or w/c? No  Grab bars in the bathroom? Yes  Shower chair or bench  in shower? Yes  Elevated toilet seat or a handicapped toilet? No    Cognitive Function:        12/08/2021    8:09 AM 12/02/2019    8:28 AM 04/06/2017    8:39 AM  6CIT Screen  What Year? 0 points 0 points 0 points  What month? 0 points 0 points 0 points  What time? 0 points 0 points 0 points  Count back from 20 0 points 0 points 0 points  Months in reverse 0 points 0 points 0 points  Repeat phrase 0 points 0 points 0 points  Total Score 0 points 0 points 0 points    Immunizations Immunization History  Administered Date(s) Administered   Fluad Quad(high Dose 65+) 12/25/2019, 11/23/2021   Influenza, High Dose Seasonal PF 11/22/2018   Influenza,inj,Quad PF,6+ Mos 11/07/2017   Influenza-Unspecified 12/12/2016, 11/22/2018, 01/06/2021   PFIZER(Purple Top)SARS-COV-2 Vaccination 04/04/2019, 04/25/2019   Pneumococcal Conjugate-13 04/06/2017   Pneumococcal Polysaccharide-23 12/03/2018   Zoster Recombinat (Shingrix) 07/26/2017, 10/02/2017    TDAP status: Due, Education has been provided regarding the importance of this vaccine. Advised may receive this vaccine at local pharmacy or Health Dept. Aware to provide a copy of the vaccination record if obtained from local pharmacy or Health Dept. Verbalized acceptance and understanding.  Flu Vaccine status: Up to date  Pneumococcal vaccine status: Up to date  Covid-19 vaccine status: Completed vaccines  Qualifies for Shingles Vaccine? Yes   Zostavax completed No   Shingrix Completed?: Yes  Screening Tests Health Maintenance  Topic Date Due   Pneumonia Vaccine 79+ Years old  Completed   INFLUENZA VACCINE  Completed   Zoster Vaccines- Shingrix  Completed   HPV VACCINES  Aged Out   DEXA SCAN  Discontinued   TETANUS/TDAP  Discontinued   COVID-19 Vaccine  Discontinued    Health Maintenance  There are no preventive care reminders to display for this patient.  Colorectal cancer screening: No longer required.   Mammogram status: No  longer required due to age.  Lung Cancer Screening: (Low Dose CT Chest recommended if Age 51-80 years, 30 pack-year currently smoking OR have quit w/in 15years.) does not qualify.   Lung Cancer Screening Referral: N/A  Additional Screening:  Hepatitis C Screening: does not qualify.  Vision Screening: Recommended annual ophthalmology exams for early detection of glaucoma and other disorders of the eye. Is the patient up to date with their annual eye exam?  Yes  Who is the provider or what is the name of the office in which the patient attends annual eye exams? Dr [facility]til[facility] If pt is not established with a provider, would they like to be referred to a provider to establish care?  N/A .   Dental Screening: Recommended annual dental exams for proper oral hygiene  Community Resource Referral / Chronic Care Management: CRR required this visit?  No   CCM required this visit?  No      Plan:     I have personally reviewed and noted the following in the patient's chart:   Medical and social history Use of alcohol, tobacco or illicit drugs  Current medications and supplements including opioid prescriptions. Patient is not currently taking opioid prescriptions. Functional ability and status  Nutritional status Physical activity Advanced directives List of other physicians Hospitalizations, surgeries, and ER visits in previous 12 months Vitals Screenings to include cognitive, depression, and falls Referrals and appointments  In addition, I have reviewed and discussed with patient certain preventive protocols, quality metrics, and best practice recommendations. A written personalized care plan for preventive services as well as general preventive health recommendations were provided to patient.     Clista Bernhardt, [place]   12/08/2021    Ms. Montijo , Thank you for taking time to come for your Medicare Wellness Visit. I appreciate your ongoing commitment to your health  goals. Please review the following plan we discussed and let me know if I can assist you in the future.   These are the goals we discussed:  Goals      DIET - INCREASE WATER INTAKE     Recommend to drink at least 6-8 8oz glasses of water per day.     Lifestyle Change-Hypertension     Timeframe:  Long-Range Goal Priority:  High Start Date:                             Expected End Date:                       Follow Up Date 08/04/20    - agree to work together to make changes - learn about high blood pressure     Why is this important?   The changes that you are asked to make may be hard to do.  This is especially true when the changes are life-long.  Knowing why it is important to you is the first step.  Working on the change with your family or support person helps you not feel alone.  Reward yourself and family or support person when goals are met. This can be an activity you choose like bowling, hiking, biking, swimming or shooting hoops.     Notes:      Manage My Medicine     Timeframe:  Long-Range Goal Priority:  Medium Start Date:                             Expected End Date:                       Follow Up Date 08/04/20    - call for medicine refill 2 or 3 days before it runs out - call if I am sick and can't take my medicine - keep a list of all the medicines I take; vitamins and herbals too - use a pillbox to sort medicine    Why is this important?   These steps will help you keep on track with your medicines.   Notes: Patient plans to transfer all medications to [facility]. Requesting refill levothyroxine--not needed per chart should have 1 year supply at [facility]. Patient informed of transfer process and voiced understanding.     Track and Manage My Blood Pressure-Hypertension     Timeframe:  Long-Range Goal Priority:  High Start Date:                             Expected End Date:  Follow Up Date 09/15/20   - check blood pressure  daily - write blood pressure results in a log or diary    Why is this important?   You won't feel high blood pressure, but it can still hurt your blood vessels.  High blood pressure can cause heart or kidney problems. It can also cause a stroke.  Making lifestyle changes like losing a little weight or eating less salt will help.  Checking your blood pressure at home and at different times of the day can help to control blood pressure.  If the doctor prescribes medicine remember to take it the way the doctor ordered.  Call the office if you cannot afford the medicine or if there are questions about it.     Notes:         This is a list of the screening recommended for you and due dates:  Health Maintenance  Topic Date Due   Pneumonia Vaccine  Completed   Flu Shot  Completed   Zoster (Shingles) Vaccine  Completed   HPV Vaccine  Aged Out   DEXA scan (bone density measurement)  Discontinued   Tetanus Vaccine  Discontinued   COVID-19 Vaccine  Discontinued      Nurse Notes: None.

## 2021-12-08 ENCOUNTER — Ambulatory Visit (INDEPENDENT_AMBULATORY_CARE_PROVIDER_SITE_OTHER): Payer: Medicare HMO

## 2021-12-08 VITALS — Ht 65.0 in | Wt 195.0 lb

## 2021-12-08 DIAGNOSIS — Z0000 Encounter for general adult medical examination without abnormal findings: Secondary | ICD-10-CM

## 2021-12-27 DIAGNOSIS — R002 Palpitations: Secondary | ICD-10-CM | POA: Diagnosis not present

## 2021-12-27 DIAGNOSIS — I10 Essential (primary) hypertension: Secondary | ICD-10-CM | POA: Diagnosis not present

## 2021-12-27 DIAGNOSIS — I480 Paroxysmal atrial fibrillation: Secondary | ICD-10-CM | POA: Diagnosis not present

## 2021-12-27 DIAGNOSIS — I7121 Aneurysm of the ascending aorta, without rupture: Secondary | ICD-10-CM | POA: Diagnosis not present

## 2021-12-27 DIAGNOSIS — E782 Mixed hyperlipidemia: Secondary | ICD-10-CM | POA: Diagnosis not present

## 2021-12-27 DIAGNOSIS — I25118 Atherosclerotic heart disease of native coronary artery with other forms of angina pectoris: Secondary | ICD-10-CM | POA: Diagnosis not present

## 2021-12-27 DIAGNOSIS — R600 Localized edema: Secondary | ICD-10-CM | POA: Diagnosis not present

## 2022-01-13 ENCOUNTER — Ambulatory Visit: Payer: Medicare HMO | Admitting: Dermatology

## 2022-01-24 ENCOUNTER — Ambulatory Visit: Payer: Medicare HMO | Admitting: Dermatology

## 2022-01-31 DIAGNOSIS — E039 Hypothyroidism, unspecified: Secondary | ICD-10-CM | POA: Diagnosis not present

## 2022-01-31 DIAGNOSIS — I4891 Unspecified atrial fibrillation: Secondary | ICD-10-CM | POA: Diagnosis not present

## 2022-02-28 DIAGNOSIS — B338 Other specified viral diseases: Secondary | ICD-10-CM | POA: Diagnosis not present

## 2022-03-16 DIAGNOSIS — M9902 Segmental and somatic dysfunction of thoracic region: Secondary | ICD-10-CM | POA: Diagnosis not present

## 2022-03-16 DIAGNOSIS — M546 Pain in thoracic spine: Secondary | ICD-10-CM | POA: Diagnosis not present

## 2022-03-16 DIAGNOSIS — M531 Cervicobrachial syndrome: Secondary | ICD-10-CM | POA: Diagnosis not present

## 2022-03-16 DIAGNOSIS — M545 Low back pain, unspecified: Secondary | ICD-10-CM | POA: Diagnosis not present

## 2022-03-16 DIAGNOSIS — M9903 Segmental and somatic dysfunction of lumbar region: Secondary | ICD-10-CM | POA: Diagnosis not present

## 2022-03-16 DIAGNOSIS — M9901 Segmental and somatic dysfunction of cervical region: Secondary | ICD-10-CM | POA: Diagnosis not present

## 2022-03-18 DIAGNOSIS — R32 Unspecified urinary incontinence: Secondary | ICD-10-CM | POA: Diagnosis not present

## 2022-03-18 DIAGNOSIS — Z811 Family history of alcohol abuse and dependence: Secondary | ICD-10-CM | POA: Diagnosis not present

## 2022-03-18 DIAGNOSIS — Z8249 Family history of ischemic heart disease and other diseases of the circulatory system: Secondary | ICD-10-CM | POA: Diagnosis not present

## 2022-03-18 DIAGNOSIS — Z008 Encounter for other general examination: Secondary | ICD-10-CM | POA: Diagnosis not present

## 2022-03-18 DIAGNOSIS — I4891 Unspecified atrial fibrillation: Secondary | ICD-10-CM | POA: Diagnosis not present

## 2022-03-18 DIAGNOSIS — D6869 Other thrombophilia: Secondary | ICD-10-CM | POA: Diagnosis not present

## 2022-03-18 DIAGNOSIS — R69 Illness, unspecified: Secondary | ICD-10-CM | POA: Diagnosis not present

## 2022-03-18 DIAGNOSIS — Z87891 Personal history of nicotine dependence: Secondary | ICD-10-CM | POA: Diagnosis not present

## 2022-03-18 DIAGNOSIS — I25119 Atherosclerotic heart disease of native coronary artery with unspecified angina pectoris: Secondary | ICD-10-CM | POA: Diagnosis not present

## 2022-03-18 DIAGNOSIS — K219 Gastro-esophageal reflux disease without esophagitis: Secondary | ICD-10-CM | POA: Diagnosis not present

## 2022-03-18 DIAGNOSIS — Z809 Family history of malignant neoplasm, unspecified: Secondary | ICD-10-CM | POA: Diagnosis not present

## 2022-03-18 DIAGNOSIS — E669 Obesity, unspecified: Secondary | ICD-10-CM | POA: Diagnosis not present

## 2022-03-18 DIAGNOSIS — I10 Essential (primary) hypertension: Secondary | ICD-10-CM | POA: Diagnosis not present

## 2022-04-05 ENCOUNTER — Ambulatory Visit: Payer: Medicare HMO | Admitting: Family Medicine

## 2022-06-28 DIAGNOSIS — I25118 Atherosclerotic heart disease of native coronary artery with other forms of angina pectoris: Secondary | ICD-10-CM | POA: Diagnosis not present

## 2022-06-28 DIAGNOSIS — I10 Essential (primary) hypertension: Secondary | ICD-10-CM | POA: Diagnosis not present

## 2022-06-28 DIAGNOSIS — R600 Localized edema: Secondary | ICD-10-CM | POA: Diagnosis not present

## 2022-06-28 DIAGNOSIS — E782 Mixed hyperlipidemia: Secondary | ICD-10-CM | POA: Diagnosis not present

## 2022-06-28 DIAGNOSIS — I480 Paroxysmal atrial fibrillation: Secondary | ICD-10-CM | POA: Diagnosis not present

## 2022-06-28 DIAGNOSIS — I7121 Aneurysm of the ascending aorta, without rupture: Secondary | ICD-10-CM | POA: Diagnosis not present

## 2022-06-28 DIAGNOSIS — R002 Palpitations: Secondary | ICD-10-CM | POA: Diagnosis not present

## 2022-08-15 ENCOUNTER — Ambulatory Visit (INDEPENDENT_AMBULATORY_CARE_PROVIDER_SITE_OTHER): Payer: Medicare HMO | Admitting: Family Medicine

## 2022-08-15 ENCOUNTER — Encounter: Payer: Self-pay | Admitting: Family Medicine

## 2022-08-15 VITALS — BP 122/76 | HR 62 | Ht 65.0 in | Wt 201.0 lb

## 2022-08-15 DIAGNOSIS — R051 Acute cough: Secondary | ICD-10-CM | POA: Diagnosis not present

## 2022-08-15 DIAGNOSIS — J0100 Acute maxillary sinusitis, unspecified: Secondary | ICD-10-CM

## 2022-08-15 MED ORDER — BENZONATATE 100 MG PO CAPS: 100.0000 mg | ORAL_CAPSULE | Freq: Three times a day (TID) | ORAL | 0 refills | Status: DC | PRN

## 2022-08-15 MED ORDER — AMOXICILLIN 500 MG PO CAPS: 500.0000 mg | ORAL_CAPSULE | Freq: Three times a day (TID) | ORAL | 0 refills | Status: AC

## 2022-08-15 NOTE — Progress Notes (Signed)
Date:  08/15/2022   Name:  Kaitlyn Oliver   DOB:  08-Aug-1933   MRN:  [id number]   Chief Complaint: Cough (Runny nose, no voice on Saturday. Cough with little yellow production. Sounds stuffy)  Cough This is a new problem. The current episode started in the past 7 days. The problem has been gradually worsening. The problem occurs every few minutes. The cough is Productive of purulent sputum (yellow). Associated symptoms include headaches, nasal congestion, postnasal drip, rhinorrhea, a sore throat and sweats. Pertinent negatives include no chest pain, chills, ear congestion, ear pain, fever, heartburn, hemoptysis, myalgias, rash, shortness of breath or wheezing. The symptoms are aggravated by lying down. Treatments tried: nsaid.    Lab Results  Component Value Date   NA 139 11/19/2020   K 3.2 (L) 11/19/2020   CO2 23 11/19/2020   GLUCOSE 125 (H) 11/19/2020   BUN 14 11/19/2020   CREATININE 0.78 11/19/2020   CALCIUM 9.4 11/19/2020   GFRNONAA >60 11/19/2020   Lab Results  Component Value Date   CHOL 227 (H) 02/15/2017   HDL 33 (L) 02/15/2017   LDLCALC Comment 02/15/2017   TRIG 534 (H) 02/15/2017   CHOLHDL 6.9 (H) 02/15/2017   Lab Results  Component Value Date   TSH 6.222 (H) 11/19/2020   Lab Results  Component Value Date   HGBA1C 5.6 01/01/2020   Lab Results  Component Value Date   WBC 5.1 11/19/2020   HGB 14.1 11/19/2020   HCT 39.8 11/19/2020   MCV 89.8 11/19/2020   PLT 142 (L) 11/19/2020   Lab Results  Component Value Date   ALT 30 07/27/2020   AST 36 07/27/2020   ALKPHOS 59 07/27/2020   BILITOT 0.8 07/27/2020   No results found for: "25OHVITD2", "25OHVITD3", "VD25OH"   Review of Systems  Constitutional:  Negative for chills and fever.  HENT:  Positive for congestion, postnasal drip, rhinorrhea, sinus pressure, sneezing and sore throat. Negative for ear pain and nosebleeds.   Respiratory:  Positive for cough. Negative for hemoptysis, chest tightness,  shortness of breath, wheezing and stridor.   Cardiovascular:  Negative for chest pain and palpitations.  Gastrointestinal:  Positive for nausea. Negative for abdominal distention and heartburn.  Endocrine: Negative for polydipsia and polyuria.  Musculoskeletal:  Negative for myalgias.  Skin:  Negative for rash.  Neurological:  Positive for dizziness and headaches.    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   Goals of care, counseling/discussion 01/26/2018   Small bowel tumor    Pancreatic cyst 01/15/2018   Anxiety and depression 01/15/2018   Bowel obstruction (HCC) 11/29/2017   Small bowel obstruction (HCC)    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/19/2017   Status post total knee replacement using cement, right 05/30/2017   GERD (gastroesophageal reflux disease) 12/22/2015   Adenomatous polyp 09/11/2014   Bilateral cataracts 09/11/2014   Gastric catarrh 09/11/2014   Gastroduodenal ulcer 09/11/2014   Hypothyroidism 09/11/2014   Cardiac murmur 10/08/2013   Accelerated hypertension 10/08/2013   Combined fat and carbohydrate induced hyperlipemia 10/08/2013  Allergies  Allergen Reactions   Nitroglycerin Other (See Comments)    Bottoms out BP to 60's    Nitroglycerin In D5w Other (See Comments)    Bottoms out BP    Valsartan Other (See Comments)    Dizziness   Adhesive [Tape] Dermatitis    Can not use plastic tape. Paper tape ok    Amlodipine Swelling    Throat swelling   Asa [Aspirin] Itching   Hydralazine Other (See  Comments)    Oliguria (low urine output)   Hydrochlorothiazide Other (See Comments)    Hypokalemia leading to AFib   Penicillin G Itching    Has patient had a PCN reaction causing immediate rash, facial/tongue/throat swelling, SOB or lightheadedness with hypotension: No Has patient had a PCN reaction causing severe rash involving mucus membranes or skin necrosis: No Has patient had a PCN reaction that required hospitalization: No Has patient had a PCN reaction occurring within the last 10 years: No If all of the above answers are "NO", then may proceed with Cephalosporin use.    Hydrocodone-Acetaminophen Itching and Rash    Past Surgical History:  Procedure Laterality Date   BOWEL RESECTION  01/17/2018   Procedure: SMALL BOWEL RESECTION;  Surgeon: Leafy Ro, MD;  Location: [facility];  Service: General;;   BREAST SURGERY Bilateral 1991   mastectomy   CATARACT EXTRACTION Bilateral    CHOLECYSTECTOMY     COLONOSCOPY  2013   Dr Marva Panda   CORONARY STENT INTERVENTION N/A 05/06/2021   Procedure: CORONARY STENT INTERVENTION;  Surgeon: Marcina Millard, MD;  Location: [facility];  Service: Cardiovascular;  Laterality: N/A;   HALLUX VALGUS AUSTIN Right 06/03/2015   Procedure: HALLUX VALGUS AUSTIN;  Surgeon: Gwyneth Revels, DPM;  Location: [facility];  Service: Podiatry;  Laterality: Right;  WITH POPLITEAL   KNEE ARTHROSCOPY WITH MEDIAL MENISECTOMY Right 08/30/2016   Procedure: KNEE ARTHROSCOPY WITH PARTIAL MEDIAL MENISECTOMY and Partial Lateral Menisectomy;  Surgeon: Kennedy Bucker, MD;  Location: [facility];  Service: Orthopedics;  Laterality: Right;   KNEE ARTHROSCOPY WITH SUBCHONDROPLASTY Right 08/30/2016   Procedure: KNEE ARTHROSCOPY WITH SUBCHONDROPLASTY;  Surgeon: Kennedy Bucker, MD;  Location: [facility];  Service: Orthopedics;  Laterality: Right;   LAPAROTOMY N/A 01/17/2018   Procedure: EXPLORATORY LAPAROTOMY;  Surgeon: Leafy Ro, MD;  Location: [facility];  Service:  General;  Laterality: N/A;   LEFT HEART CATH AND CORONARY ANGIOGRAPHY Left 05/06/2021   Procedure: LEFT HEART CATH AND CORONARY ANGIOGRAPHY;  Surgeon: Marcina Millard, MD;  Location: [facility];  Service: Cardiovascular;  Laterality: Left;   MASTECTOMY Bilateral    ROTATOR CUFF REPAIR Bilateral    TONSILLECTOMY     TOTAL KNEE ARTHROPLASTY Left    TOTAL KNEE ARTHROPLASTY Right 05/30/2017   Procedure: TOTAL KNEE ARTHROPLASTY;  Surgeon: Kennedy Bucker, MD;  Location: [facility];  Service: Orthopedics;  Laterality: Right;   VAGINAL HYSTERECTOMY      Social History   Tobacco Use   Smoking status: Former    Packs/day: 0.25    Years: 10.00    Additional pack years: 0.00    Total pack years: 2.50    Types: Cigarettes    Quit date: 1991    Years since quitting: 33.4   Smokeless tobacco: Never   Tobacco comments:    smoking cessation materials not required  Vaping Use   Vaping Use: Never used  Substance Use Topics   Alcohol use: No    Alcohol/week: 0.0 standard drinks of  alcohol   Drug use: No     Medication list has been reviewed and updated.  Current Meds  Medication Sig   ALPRAZolam (XANAX) 0.25 MG tablet Take 1 tablet (0.25 mg total) by mouth 2 (two) times daily as needed for anxiety.   clopidogrel (PLAVIX) 75 MG tablet Take 1 tablet (75 mg total) by mouth daily with breakfast.   EUTHYROX 50 MCG tablet Take 50 mcg by mouth daily before breakfast. Dr Gershon Crane   isosorbide mononitrate (IMDUR) 60 MG 24 hr tablet Take 1 tablet (60 mg total) by mouth daily.   metoprolol succinate (TOPROL-XL) 25 MG 24 hr tablet Take 1 tablet (25 mg total) by mouth daily.   omeprazole (PRILOSEC) 20 MG capsule Take 1 capsule (20 mg total) by mouth daily.   sertraline (ZOLOFT) 25 MG tablet Take 1 tablet (25 mg total) by mouth daily.       08/15/2022   11:08 AM 11/23/2021    2:20 PM 09/22/2020    1:39 PM 06/25/2020    3:24 PM  GAD 7 : Generalized Anxiety Score  Nervous, Anxious, on Edge 0  0 2 1  Control/stop worrying 0 0 1 1  Worry too much - different things 0 0 1 1  Trouble relaxing 0 0 2 0  Restless 0 0 3 0  Easily annoyed or irritable 0 0 3 1  Afraid - awful might happen 0 0 1 0  Total GAD 7 Score 0 0 13 4  Anxiety Difficulty Not difficult at all Not difficult at all Very difficult        08/15/2022   11:08 AM 12/08/2021    8:07 AM 11/23/2021    2:19 PM  Depression screen PHQ 2/9  Decreased Interest 0 1 0  Down, Depressed, Hopeless 0 1 1  PHQ - 2 Score 0 2 1  Altered sleeping 0 1 0  Tired, decreased energy 0 1 0  Change in appetite 0 0 0  Feeling bad or failure about yourself  0 0 0  Trouble concentrating 0 0 0  Moving slowly or fidgety/restless 0 0 0  Suicidal thoughts 0 0 0  PHQ-9 Score 0 4 1  Difficult doing work/chores Not difficult at all Not difficult at all Not difficult at all    BP Readings from Last 3 Encounters:  08/15/22 122/76  11/23/21 124/80  05/13/21 120/80    Physical Exam Vitals and nursing note reviewed.  Constitutional:      Appearance: She is well-developed.  HENT:     Head: Normocephalic.     Right Ear: Tympanic membrane, ear canal and external ear normal.     Left Ear: Tympanic membrane, ear canal and external ear normal.     Nose: No congestion or rhinorrhea.     Right Sinus: Maxillary sinus tenderness and frontal sinus tenderness present.     Left Sinus: Maxillary sinus tenderness and frontal sinus tenderness present.     Mouth/Throat:     Mouth: Mucous membranes are moist.     Pharynx: No oropharyngeal exudate or posterior oropharyngeal erythema.  Eyes:     General: Lids are everted, no foreign bodies appreciated. No scleral icterus.       Left eye: No foreign body or hordeolum.     Conjunctiva/sclera: Conjunctivae normal.     Right eye: Right conjunctiva is not injected.     Left eye: Left conjunctiva is not injected.     Pupils: Pupils are equal, round, and reactive to  light.  Neck:     Thyroid: No thyromegaly.      Vascular: No JVD.     Trachea: No tracheal deviation.  Cardiovascular:     Rate and Rhythm: Normal rate and regular rhythm.     Heart sounds: Normal heart sounds. No murmur heard.    No friction rub. No gallop.  Pulmonary:     Effort: Pulmonary effort is normal. No respiratory distress.     Breath sounds: Normal breath sounds. No wheezing, rhonchi or rales.  Abdominal:     General: Bowel sounds are normal.     Palpations: Abdomen is soft. There is no mass.     Tenderness: There is no abdominal tenderness. There is no guarding or rebound.  Musculoskeletal:        General: No tenderness. Normal range of motion.     Cervical back: Normal range of motion and neck supple.  Lymphadenopathy:     Cervical: No cervical adenopathy.  Skin:    General: Skin is warm.     Findings: No rash.  Neurological:     Mental Status: She is alert and oriented to person, place, and time.     Cranial Nerves: No cranial nerve deficit.     Deep Tendon Reflexes: Reflexes normal.  Psychiatric:        Mood and Affect: Mood is not anxious or depressed.     Wt Readings from Last 3 Encounters:  08/15/22 201 lb (91.2 kg)  12/08/21 195 lb (88.5 kg)  11/23/21 195 lb (88.5 kg)    BP 122/76   Pulse 62   Ht 5\' 5"  (1.651 m)   Wt 201 lb (91.2 kg)   SpO2 95%   BMI 33.45 kg/m   Assessment and Plan:  1. Acute non-recurrent maxillary sinusitis New onset.  Persistent.  Stable.  Exam and history is consistent with an acute maxillary sinus infection.  We will prescribe amoxicillin 500 mg 3 times a day and also have encouraged that the patient take a nasal steroid such as Flonase or Nasacort and lavage with nasal saline . - amoxicillin (AMOXIL) 500 MG capsule; Take 1 capsule (500 mg total) by mouth 3 (three) times daily for 10 days.  Dispense: 30 capsule; Refill: 0  2. Acute cough New onset.  Episodic.  Stable.  Previously patient did well on provide Tessalon Perles 100 mg 3 times a day number also suggested  picking up Mucinex DM to take on a regular basis for mucolytic action and cough suppression. - benzonatate (TESSALON PERLES) 100 MG capsule; Take 1 capsule (100 mg total) by mouth 3 (three) times daily as needed for cough.  Dispense: 30 capsule; Refill: 0    Elizabeth Sauer, MD

## 2022-08-30 ENCOUNTER — Encounter: Payer: Self-pay | Admitting: Family Medicine

## 2022-08-30 ENCOUNTER — Ambulatory Visit (INDEPENDENT_AMBULATORY_CARE_PROVIDER_SITE_OTHER): Payer: Medicare HMO | Admitting: Family Medicine

## 2022-08-30 VITALS — BP 128/78 | HR 62 | Ht 65.0 in | Wt 192.0 lb

## 2022-08-30 DIAGNOSIS — I2585 Chronic coronary microvascular dysfunction: Secondary | ICD-10-CM | POA: Diagnosis not present

## 2022-08-30 DIAGNOSIS — F32A Depression, unspecified: Secondary | ICD-10-CM | POA: Diagnosis not present

## 2022-08-30 DIAGNOSIS — F419 Anxiety disorder, unspecified: Secondary | ICD-10-CM

## 2022-08-30 DIAGNOSIS — K219 Gastro-esophageal reflux disease without esophagitis: Secondary | ICD-10-CM

## 2022-08-30 MED ORDER — ISOSORBIDE MONONITRATE ER 60 MG PO TB24: 60.0000 mg | ORAL_TABLET | Freq: Every day | ORAL | 0 refills | Status: DC

## 2022-08-30 MED ORDER — SERTRALINE HCL 25 MG PO TABS: 25.0000 mg | ORAL_TABLET | Freq: Every day | ORAL | 1 refills | Status: DC

## 2022-08-30 MED ORDER — OMEPRAZOLE 20 MG PO CPDR: 20.0000 mg | DELAYED_RELEASE_CAPSULE | Freq: Every day | ORAL | 1 refills | Status: DC

## 2022-08-30 MED ORDER — METOPROLOL SUCCINATE ER 25 MG PO TB24: 25.0000 mg | ORAL_TABLET | Freq: Every day | ORAL | 0 refills | Status: DC

## 2022-08-30 MED ORDER — CLOPIDOGREL BISULFATE 75 MG PO TABS: 75.0000 mg | ORAL_TABLET | Freq: Every day | ORAL | 0 refills | Status: DC

## 2022-08-30 NOTE — Progress Notes (Signed)
Date:  08/30/2022   Name:  Kaitlyn Oliver   DOB:  09-09-1933   MRN:  [id number]   Chief Complaint: Depression, Gastroesophageal Reflux, Hypertension, and Coronary Artery Disease  Depression        This is a chronic problem.  The current episode started more than 1 year ago.   The problem occurs intermittently.  The problem has been gradually improving since onset.  Associated symptoms include no decreased concentration, no fatigue, no helplessness, no hopelessness, does not have insomnia, not irritable, no restlessness, no decreased interest, no appetite change, no body aches, no myalgias, no headaches, no indigestion, not sad and no suicidal ideas.  Past treatments include SSRIs - Selective serotonin reuptake inhibitors.  Compliance with treatment is good.  Previous treatment provided moderate relief. Gastroesophageal Reflux She complains of heartburn. She reports no chest pain, no choking, no coughing, no dysphagia, no sore throat, no stridor or no wheezing. This is a chronic problem. The problem occurs occasionally. The problem has been gradually improving. The symptoms are aggravated by certain foods. Pertinent negatives include no fatigue or melena. She has tried a PPI for the symptoms. The treatment provided moderate relief.  Hypertension This is a chronic problem. The current episode started more than 1 year ago. The problem has been gradually improving since onset. The problem is controlled. Pertinent negatives include no chest pain, headaches, palpitations, PND or shortness of breath. Past treatments include calcium channel blockers. The current treatment provides moderate improvement. There are no compliance problems.  There is no history of CAD/MI or CVA. There is no history of chronic renal disease, a hypertension causing med or renovascular disease.  Coronary Artery Disease Presents for follow-up visit. Pertinent negatives include no chest pain, chest tightness, leg swelling,  palpitations or shortness of breath. Risk factors include hypertension. The symptoms have been stable.    Lab Results  Component Value Date   NA 139 11/19/2020   K 3.2 (L) 11/19/2020   CO2 23 11/19/2020   GLUCOSE 125 (H) 11/19/2020   BUN 14 11/19/2020   CREATININE 0.78 11/19/2020   CALCIUM 9.4 11/19/2020   GFRNONAA >60 11/19/2020   Lab Results  Component Value Date   CHOL 227 (H) 02/15/2017   HDL 33 (L) 02/15/2017   LDLCALC Comment 02/15/2017   TRIG 534 (H) 02/15/2017   CHOLHDL 6.9 (H) 02/15/2017   Lab Results  Component Value Date   TSH 6.222 (H) 11/19/2020   Lab Results  Component Value Date   HGBA1C 5.6 01/01/2020   Lab Results  Component Value Date   WBC 5.1 11/19/2020   HGB 14.1 11/19/2020   HCT 39.8 11/19/2020   MCV 89.8 11/19/2020   PLT 142 (L) 11/19/2020   Lab Results  Component Value Date   ALT 30 07/27/2020   AST 36 07/27/2020   ALKPHOS 59 07/27/2020   BILITOT 0.8 07/27/2020   No results found for: "25OHVITD2", "25OHVITD3", "VD25OH"   Review of Systems  Constitutional:  Negative for appetite change and fatigue.  HENT:  Negative for congestion, sinus pressure and sore throat.   Respiratory:  Negative for cough, choking, chest tightness, shortness of breath and wheezing.   Cardiovascular:  Negative for chest pain, palpitations, leg swelling and PND.  Gastrointestinal:  Positive for heartburn. Negative for blood in stool, constipation, dysphagia and melena.  Musculoskeletal:  Negative for myalgias.  Neurological:  Negative for headaches.  Psychiatric/Behavioral:  Positive for depression. Negative for decreased concentration and suicidal ideas. The patient  does not have insomnia.     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   Goals of care, counseling/discussion 01/26/2018   Small bowel tumor    Pancreatic cyst 01/15/2018   Anxiety and depression 01/15/2018   Bowel obstruction (HCC) 11/29/2017   Small bowel obstruction (HCC)    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/19/2017   Status post total knee replacement using cement, right 05/30/2017   GERD (gastroesophageal reflux disease) 12/22/2015   Adenomatous polyp 09/11/2014   Bilateral cataracts 09/11/2014   Gastric catarrh 09/11/2014   Gastroduodenal ulcer 09/11/2014   Hypothyroidism 09/11/2014   Cardiac murmur 10/08/2013   Accelerated hypertension 10/08/2013   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 Paper tape ok    Amlodipine Swelling    Throat swelling   Asa [Aspirin] Itching   Hydralazine Other (See Comments)    Oliguria (low urine output)   Hydrochlorothiazide Other (See Comments)    Hypokalemia leading to AFib   Penicillin G Itching    Has patient had a PCN reaction causing immediate rash, facial/tongue/throat swelling, SOB or lightheadedness with hypotension: No Has patient had a PCN reaction causing severe rash involving mucus membranes or skin necrosis: No Has patient had a PCN reaction that required hospitalization: No Has patient had a PCN reaction occurring within the last 10 years: No If all of the above answers are "NO", then may proceed with Cephalosporin use.    Hydrocodone-Acetaminophen Itching and Rash    Past  Surgical History:  Procedure Laterality Date   BOWEL RESECTION  01/17/2018   Procedure: SMALL BOWEL RESECTION;  Surgeon: Leafy Ro, MD;  Location: [facility];  Service: General;;   BREAST SURGERY Bilateral 1991   mastectomy   CATARACT EXTRACTION Bilateral    CHOLECYSTECTOMY     COLONOSCOPY  2013   Dr Marva Panda   CORONARY STENT INTERVENTION N/A 05/06/2021   Procedure: CORONARY STENT INTERVENTION;  Surgeon: Marcina Millard, MD;  Location: [facility];  Service: Cardiovascular;  Laterality: N/A;   HALLUX VALGUS AUSTIN Right 06/03/2015   Procedure: HALLUX VALGUS AUSTIN;  Surgeon: Gwyneth Revels, DPM;  Location: [facility];  Service: Podiatry;  Laterality: Right;  WITH POPLITEAL   KNEE ARTHROSCOPY WITH MEDIAL MENISECTOMY Right 08/30/2016   Procedure: KNEE ARTHROSCOPY WITH PARTIAL MEDIAL MENISECTOMY and Partial Lateral Menisectomy;  Surgeon: Kennedy Bucker, MD;  Location: [facility];  Service: Orthopedics;  Laterality: Right;   KNEE ARTHROSCOPY WITH SUBCHONDROPLASTY Right 08/30/2016   Procedure: KNEE ARTHROSCOPY WITH SUBCHONDROPLASTY;  Surgeon: Kennedy Bucker, MD;  Location: [facility];  Service: Orthopedics;  Laterality: Right;   LAPAROTOMY N/A 01/17/2018   Procedure: EXPLORATORY LAPAROTOMY;  Surgeon: Leafy Ro, MD;  Location: [facility];  Service: General;  Laterality: N/A;   LEFT HEART CATH AND CORONARY ANGIOGRAPHY Left 05/06/2021   Procedure: LEFT HEART CATH AND CORONARY ANGIOGRAPHY;  Surgeon: Marcina Millard, MD;  Location: [facility];  Service: Cardiovascular;  Laterality: Left;   MASTECTOMY Bilateral    ROTATOR CUFF REPAIR Bilateral    TONSILLECTOMY     TOTAL KNEE ARTHROPLASTY Left    TOTAL KNEE ARTHROPLASTY Right 05/30/2017   Procedure: TOTAL KNEE ARTHROPLASTY;  Surgeon: Kennedy Bucker, MD;  Location: [facility];  Service: Orthopedics;  Laterality: Right;   VAGINAL HYSTERECTOMY      Social History   Tobacco Use   Smoking status: Former    Packs/day: 0.25     Years: 10.00    Additional pack years: 0.00    Total pack years: 2.50    Types: Cigarettes    Quit date: 1991    Years since quitting: 33.4   Smokeless tobacco: Never   Tobacco comments:    smoking cessation materials not required  Vaping Use   Vaping Use: Never used  Substance Use Topics   Alcohol use: No    Alcohol/week: 0.0 standard drinks of alcohol   Drug use: No     Medication list has been reviewed and updated.  Current Meds  Medication Sig   benzonatate (TESSALON PERLES) 100 MG capsule Take 1 capsule (100 mg total) by mouth 3 (three) times daily as needed for cough.   clopidogrel (PLAVIX) 75 MG tablet Take 1 tablet (75 mg total) by mouth daily with breakfast.   EUTHYROX 50 MCG tablet Take 50 mcg by mouth daily before breakfast. Dr Gershon Crane   isosorbide mononitrate (IMDUR) 60 MG 24 hr tablet Take 1 tablet (60 mg total) by mouth daily.   metoprolol succinate (TOPROL-XL) 25 MG 24 hr tablet Take 1 tablet (25 mg total) by mouth daily.   omeprazole (PRILOSEC) 20 MG capsule Take 1 capsule (20 mg total) by mouth daily.   sertraline (ZOLOFT) 25 MG tablet Take 1 tablet (25 mg total) by mouth daily.       08/30/2022   10:46 AM 08/15/2022   11:08 AM 11/23/2021    2:20 PM 09/22/2020    1:39 PM  GAD 7 : Generalized Anxiety Score  Nervous, Anxious, on Edge 0 0 0 2  Control/stop worrying 0 0 0 1  Worry too much - different things 0 0 0 1  Trouble relaxing 0 0 0 2  Restless 0 0 0 3  Easily annoyed or irritable 0 0 0 3  Afraid - awful might happen 0 0 0 1  Total GAD 7 Score 0 0 0 13  Anxiety Difficulty Not difficult at all Not difficult at all Not difficult at all Very difficult       08/30/2022   10:45 AM 08/15/2022   11:08 AM 12/08/2021    8:07 AM  Depression screen PHQ 2/9  Decreased Interest 0 0 1  Down, Depressed, Hopeless 0 0 1  PHQ - 2 Score 0 0 2  Altered sleeping 0 0 1  Tired, decreased energy 0 0 1  Change in appetite 0 0 0  Feeling bad or failure about  yourself  0 0 0  Trouble concentrating 0 0 0  Moving slowly or fidgety/restless 0 0 0  Suicidal thoughts 0 0 0  PHQ-9 Score 0 0 4  Difficult doing work/chores Not difficult at all Not difficult at all Not difficult at all    BP Readings from Last 3  Encounters:  08/30/22 128/78  08/15/22 122/76  11/23/21 124/80    Physical Exam Vitals and nursing note reviewed. Exam conducted with a chaperone present.  Constitutional:      General: She is not irritable.She is not in acute distress.    Appearance: She is not diaphoretic.  HENT:     Head: Normocephalic and atraumatic.     Right Ear: Tympanic membrane and external ear normal.     Left Ear: Tympanic membrane and external ear normal.     Nose: Nose normal.     Mouth/Throat:     Mouth: Mucous membranes are moist.  Eyes:     General:        Right eye: No discharge.        Left eye: No discharge.     Extraocular Movements: Extraocular movements intact.     Conjunctiva/sclera: Conjunctivae normal.     Pupils: Pupils are equal, round, and reactive to light.  Neck:     Thyroid: No thyromegaly.     Vascular: No JVD.  Cardiovascular:     Rate and Rhythm: Normal rate and regular rhythm.     Heart sounds: Normal heart sounds. No murmur heard.    No friction rub. No gallop.  Pulmonary:     Effort: Pulmonary effort is normal.     Breath sounds: Normal breath sounds. No wheezing, rhonchi or rales.  Abdominal:     General: Bowel sounds are normal.     Palpations: Abdomen is soft. There is no mass.     Tenderness: There is no abdominal tenderness. There is no guarding.  Musculoskeletal:        General: No swelling or tenderness.     Cervical back: Normal range of motion and neck supple.  Lymphadenopathy:     Cervical: No cervical adenopathy.  Skin:    General: Skin is warm and dry.  Neurological:     Mental Status: She is alert.     Deep Tendon Reflexes: Reflexes are normal and symmetric.     Wt Readings from Last 3 Encounters:   08/30/22 192 lb (87.1 kg)  08/15/22 201 lb (91.2 kg)  12/08/21 195 lb (88.5 kg)    BP 128/78   Pulse 62   Ht 5\' 5"  (1.651 m)   Wt 192 lb (87.1 kg)   SpO2 98%   BMI 31.95 kg/m   Assessment and Plan:  1. Anxiety and depression Chronic.  Controlled.  Stable.  PHQ is 0.  GAD score is 0.  Continue sertraline 25 mg once a day.  Will recheck in 6 months. - sertraline (ZOLOFT) 25 MG tablet; Take 1 tablet (25 mg total) by mouth daily.  Dispense: 90 tablet; Refill: 1  2. Gastroesophageal reflux disease without esophagitis Chronic.  Controlled.  Stable.  Continue omeprazole 20 mg once a day.  Will check CMP for electrolytes and GFR. - omeprazole (PRILOSEC) 20 MG capsule; Take 1 capsule (20 mg total) by mouth daily.  Dispense: 90 capsule; Refill: 1 - Comprehensive Metabolic Panel (CMET)  3. Chronic coronary microvascular dysfunction Chronic.  Controlled.  Stable.  Followed by cardiology.  Patient is likely to run out of medications prior to next cardiac visit and we will refill for the patient until she can be evaluated.  In the meantime we will check a lipid panel for current status of lipid control. - clopidogrel (PLAVIX) 75 MG tablet; Take 1 tablet (75 mg total) by mouth daily with breakfast.  Dispense: 90 tablet; Refill:  0 - isosorbide mononitrate (IMDUR) 60 MG 24 hr tablet; Take 1 tablet (60 mg total) by mouth daily.  Dispense: 90 tablet; Refill: 0 - metoprolol succinate (TOPROL-XL) 25 MG 24 hr tablet; Take 1 tablet (25 mg total) by mouth daily.  Dispense: 90 tablet; Refill: 0 - Lipid Panel With LDL/HDL Ratio    Elizabeth Sauer, MD

## 2022-09-05 ENCOUNTER — Telehealth: Payer: Self-pay

## 2022-09-05 NOTE — Telephone Encounter (Signed)
Left message for pt to come get labs drawn from previous visit

## 2022-09-09 ENCOUNTER — Ambulatory Visit: Payer: Self-pay | Admitting: *Deleted

## 2022-09-09 NOTE — Telephone Encounter (Signed)
Chief Complaint: Right hip lump  Symptoms: Right hip lump that is red and painful to touch.  Frequency: Onset Tuesday morning Disposition: [] ED /[] Urgent Care (no appt availability in office) / [x] Appointment(In office/virtual)/ []  [facility]/ [] Home Care/ [] Refused Recommended Disposition /[] [facility]/ []  Follow-up with PCP Additional Notes: Spoke to patient's daughter who stated her mom called her on Tuesday and stated she had a red lump on her right hip that was painful to touch. The patient denied any injury to the hip and is unsure what is causing the lump and discomfort. Advised evaluation with PCP, no appointment was available at this time, offered urgent care and both daughter and patient declined. Patient was schedule for next available with PCP on Monday July, 1, 2024. Advised to go to Urgent care or ED if symptoms get worse. Daughter verbalized understanding.   Summary: spot on hip   Patient's daughter states that patient woke up Tuesday morning with a spot on her right hip. Patient's daughter states spot looks bruised and hard and is about the size of a quarter. No appointments until Monday.     Reason for Disposition  [1] Swelling is painful to touch AND [2] no fever  Answer Assessment - Initial Assessment Questions 1. APPEARANCE of SWELLING: "What does it look like?"     Looks bruised with a knot in it, look  2. SIZE: "How large is the swelling?" (e.g., inches, cm; or compare to size of pinhead, tip of pen, eraser, coin, pea, grape, ping pong ball)      About the size of a quarter  3. LOCATION: "Where is the swelling located?"     Right hip, right at the wistband of pants  4. ONSET: "When did the swelling start?"     Tuesday morning  5. COLOR: "What color is it?" "Is there more than one color?"     Redness  6. PAIN: "Is there any pain?" If Yes, ask: "How bad is the pain?" (e.g., scale 1-10; or mild, moderate, severe)     - NONE (0): no pain   - MILD  (1-3): doesn't interfere with normal activities    - MODERATE (4-7): interferes with normal activities or awakens from sleep    - SEVERE (8-10): excruciating pain, unable to do any normal activities     Hurt with touch 7. ITCH: "Does it itch?" If Yes, ask: "How bad is the itch?"      Unsure  8. CAUSE: "What do you think caused the swelling?" 9 OTHER SYMPTOMS: "Do you have any other symptoms?" (e.g., fever)     No  Protocols used: Skin Lump or Localized Swelling-A-AH

## 2022-09-09 NOTE — Telephone Encounter (Signed)
Attempted to return the call to the daughter Cinda Quest.   Left a message to call back to discuss symptoms with a nurse.

## 2022-09-09 NOTE — Telephone Encounter (Signed)
Message from Arther Dames sent at 09/09/2022  1:18 PM EDT  Summary: spot on hip   Patient's daughter states that patient woke up Tuesday morning with a spot on her right hip. Patient's daughter states spot looks bruised and hard and is about the size of a quarter. No appointments until Monday.          Call History   Type Contact Phone/Fax User  09/09/2022 01:15 PM EDT Phone (Incoming) [name] (Emergency Contact) [phone] Mabe, Hennie Duos

## 2022-09-12 ENCOUNTER — Ambulatory Visit (INDEPENDENT_AMBULATORY_CARE_PROVIDER_SITE_OTHER): Payer: Medicare HMO | Admitting: Family Medicine

## 2022-09-12 ENCOUNTER — Encounter: Payer: Self-pay | Admitting: Physician Assistant

## 2022-09-12 ENCOUNTER — Encounter: Payer: Self-pay | Admitting: Family Medicine

## 2022-09-12 ENCOUNTER — Ambulatory Visit (INDEPENDENT_AMBULATORY_CARE_PROVIDER_SITE_OTHER): Payer: Medicare HMO | Admitting: Physician Assistant

## 2022-09-12 VITALS — BP 120/78 | HR 64 | Ht 65.0 in | Wt 195.0 lb

## 2022-09-12 VITALS — BP 169/80 | HR 76 | Temp 98.4°F | Ht 65.0 in | Wt 194.0 lb

## 2022-09-12 DIAGNOSIS — L0291 Cutaneous abscess, unspecified: Secondary | ICD-10-CM | POA: Diagnosis not present

## 2022-09-12 DIAGNOSIS — L02219 Cutaneous abscess of trunk, unspecified: Secondary | ICD-10-CM

## 2022-09-12 DIAGNOSIS — L02415 Cutaneous abscess of right lower limb: Secondary | ICD-10-CM

## 2022-09-12 MED ORDER — SULFAMETHOXAZOLE-TRIMETHOPRIM 800-160 MG PO TABS
1.0000 | ORAL_TABLET | Freq: Two times a day (BID) | ORAL | 0 refills | Status: AC
Start: 2022-09-12 — End: 2022-09-22

## 2022-09-12 NOTE — Patient Instructions (Signed)
Leave the dressing in place and remove on Wednesday. You may shower then and let the warm soapy water run over the area, rinse well, pat dry. Then you may cover with a bandage or dry gauze dressing.   Call with any breakthrough bleeding, fevers, or spreading redness at the wound site.  Take all of your antibiotics. We will call if these need to be changed.  Follow up here next week.   Skin Abscess  A skin abscess is an infected spot of skin. It can have pus in it. An abscess can happen in any part of your body. Some abscesses break open (rupture) on their own. Most keep getting worse unless they are treated. If your abscess is not treated, the infection can spread deeper into your body and blood. This can make you feel sick. What are the causes? Germs that enter your skin. This may happen if you have: A cut or scrape. A wound from a needle or an insect bite. Blocked oil or sweat glands. A problem with the spot where your hair goes into your skin. A fluid-filled sac called a cyst under your skin. What increases the risk? Having problems with how your blood moves through your body. Having a weak body defense system (immune system). Having diabetes. Having dry and irritated skin. Needing to get shots often. Putting drugs into your body with a needle. Having a splinter or something else in your skin. Smoking. What are the signs or symptoms? A firm bump under your skin that hurts. A bump with pus at the top. Redness and swelling. Warm or tender spots. A sore on the skin. How is this treated? You may need to: Put a heat pack or a warm, wet washcloth on the spot. Have the pus drained. Take antibiotics. Follow these instructions at home: Medicines Take over-the-counter and prescription medicines only as told by your doctor. If you were prescribed antibiotics, take them as told by your doctor. Do not stop taking them even if you start to feel better. Abscess care  If you have an  abscess that has not drained, put heat on it. Use the heat source that your doctor recommends, such as a moist heat pack or a heating pad. Place a towel between your skin and the heat source. Leave the heat on for 20-30 minutes. If your skin turns bright red, take off the heat right away to prevent burns. The risk of burns is higher if you cannot feel pain, heat, or cold. Follow instructions from your doctor about how to take care of your abscess. Make sure you: Cover the abscess with a bandage. Wash your hands with soap and water for at least 20 seconds before and after you change your bandage. If you cannot use soap and water, use hand sanitizer. Change your bandage as told by your doctor. Check your abscess every day for signs that the infection is getting worse. Check for: More redness, swelling, or pain. More fluid or blood. Warmth. More pus or a worse smell. General instructions To keep the infection from spreading: Do not share personal items or towels. Do not go in a hot tub with others. Avoid making skin contact with others. Be careful when you get rid of used bandages or any pus from the abscess. Do not smoke or use any products that contain nicotine or tobacco. If you need help quitting, ask your doctor. Contact a doctor if: You see red streaks on your skin near the abscess. You have  any signs of worse infection. You vomit every time you eat or drink. You have a fever, chills, or muscle aches. The cyst or abscess comes back. Get help right away if: You have very bad pain. You make less pee (urine) than normal. This information is not intended to replace advice given to you by your health care provider. Make sure you discuss any questions you have with your health care provider. Document Revised: 10/13/2021 Document Reviewed: 10/13/2021 Elsevier Patient Education  2024 [employer].

## 2022-09-12 NOTE — Progress Notes (Addendum)
[facility] NEW PATIENT  Referring provider:  Duanne Limerick, MD [address] [place],  [place] [address]  HISTORY OF PRESENT ILLNESS (HPI):  89 y.o. female presents for evaluation of left hip abscess. Patient reports noticing a spot on her right hip/flank on Tuesday morning last week when she woke up. She initially did not think much of this. The area has been persistent since onset without any improvements. This is tender and erythematous but not draining anything. No fever, chills associated. No known injuries or trauma to the area. She went to her PCP today and was started on Bactrim. Referred here for evaluation for I&D.   PAST MEDICAL HISTORY (PMH):  Past Medical History:  Diagnosis Date   Anxiety    Arthritis    thumbs, hands   Atrial fibrillation (HCC)    Cancer (HCC) 1991   bilateral breast   Depression    GERD (gastroesophageal reflux disease)    Headache    occasional - pinched nerve in neck   Hypertension    Hypothyroidism    Thyroid disease    Wears dentures    full upper, partial lower     PAST SURGICAL HISTORY (PSH):  Past Surgical History:  Procedure Laterality Date   BOWEL RESECTION  01/17/2018   Procedure: SMALL BOWEL RESECTION;  Surgeon: Leafy Ro, MD;  Location: [facility] ORS;  Service: General;;   BREAST SURGERY Bilateral 1991   mastectomy   CATARACT EXTRACTION Bilateral    CHOLECYSTECTOMY     COLONOSCOPY  2013   Dr Marva Panda   CORONARY STENT INTERVENTION N/A 05/06/2021   Procedure: CORONARY STENT INTERVENTION;  Surgeon: Marcina Millard, MD;  Location: [facility] INVASIVE CV LAB;  Service: Cardiovascular;  Laterality: N/A;   HALLUX VALGUS AUSTIN Right 06/03/2015   Procedure: HALLUX VALGUS AUSTIN;  Surgeon: Gwyneth Revels, DPM;  Location: [facility];  Service: Podiatry;  Laterality: Right;  WITH POPLITEAL   KNEE ARTHROSCOPY WITH MEDIAL MENISECTOMY Right 08/30/2016   Procedure: KNEE ARTHROSCOPY WITH PARTIAL  MEDIAL MENISECTOMY and Partial Lateral Menisectomy;  Surgeon: Kennedy Bucker, MD;  Location: [facility] OR[facility];  Service: Orthopedics;  Laterality: Right;   KNEE ARTHROSCOPY WITH SUBCHONDROPLASTY Right 08/30/2016   Procedure: KNEE ARTHROSCOPY WITH SUBCHONDROPLASTY;  Surgeon: Kennedy Bucker, MD;  Location: [facility] OR[facility];  Service: Orthopedics;  Laterality: Right;   LAPAROTOMY N/A 01/17/2018   Procedure: EXPLORATORY LAPAROTOMY;  Surgeon: Leafy Ro, MD;  Location: [facility] ORS;  Service: General;  Laterality: N/A;   LEFT HEART CATH AND CORONARY ANGIOGRAPHY Left 05/06/2021   Procedure: LEFT HEART CATH AND CORONARY ANGIOGRAPHY;  Surgeon: Marcina Millard, MD;  Location: [facility] INVASIVE CV LAB;  Service: Cardiovascular;  Laterality: Left;   MASTECTOMY Bilateral    ROTATOR CUFF REPAIR Bilateral    TONSILLECTOMY     TOTAL KNEE ARTHROPLASTY Left    TOTAL KNEE ARTHROPLASTY Right 05/30/2017   Procedure: TOTAL KNEE ARTHROPLASTY;  Surgeon: Kennedy Bucker, MD;  Location: [facility] OR[facility];  Service: Orthopedics;  Laterality: Right;   VAGINAL HYSTERECTOMY       MEDICATIONS:  Prior to Admission medications   Medication Sig Start Date End Date Taking? Authorizing Provider  ALPRAZolam (XANAX) 0.25 MG tablet Take 1 tablet (0.25 mg total) by mouth 2 (two) times daily as needed for anxiety. 11/16/21  Yes Duanne Limerick, MD  benzonatate (TESSALON PERLES) 100 MG capsule Take 1 capsule (100 mg total) by mouth 3 (three) times daily as needed for cough. 08/15/22  Yes Elizabeth Sauer  C, MD  clopidogrel (PLAVIX) 75 MG tablet Take 1 tablet (75 mg total) by mouth daily with breakfast. 08/30/22  Yes Duanne Limerick, MD  EUTHYROX 50 MCG tablet Take 50 mcg by mouth daily before breakfast. Dr Gershon Crane 10/29/19  Yes [provider]  isosorbide mononitrate (IMDUR) 60 MG 24 hr tablet Take 1 tablet (60 mg total) by mouth daily. 08/30/22  Yes Duanne Limerick, MD  metoprolol succinate (TOPROL-XL) 25 MG 24 hr tablet Take 1 tablet (25 mg total) by mouth  daily. 08/30/22  Yes Duanne Limerick, MD  omeprazole (PRILOSEC) 20 MG capsule Take 1 capsule (20 mg total) by mouth daily. 08/30/22  Yes Duanne Limerick, MD  sertraline (ZOLOFT) 25 MG tablet Take 1 tablet (25 mg total) by mouth daily. 08/30/22  Yes Duanne Limerick, MD  sulfamethoxazole-trimethoprim (BACTRIM DS) 800-160 MG tablet Take 1 tablet by mouth 2 (two) times daily for 10 days. 09/12/22 09/22/22 Yes Duanne Limerick, MD     ALLERGIES:  Allergies  Allergen Reactions   Nitroglycerin Other (See Comments)    Bottoms out BP to 60's    Nitroglycerin In D5w Other (See Comments)    Bottoms out BP    Valsartan Other (See Comments)    Dizziness   Adhesive [Tape] Dermatitis    Can not use plastic tape. Paper tape ok    Amlodipine Swelling    Throat swelling   Asa [Aspirin] Itching   Hydralazine Other (See Comments)    Oliguria (low urine output)   Hydrochlorothiazide Other (See Comments)    Hypokalemia leading to AFib   Penicillin G Itching    Has patient had a PCN reaction causing immediate rash, facial/tongue/throat swelling, SOB or lightheadedness with hypotension: No Has patient had a PCN reaction causing severe rash involving mucus membranes or skin necrosis: No Has patient had a PCN reaction that required hospitalization: No Has patient had a PCN reaction occurring within the last 10 years: No If all of the above answers are "NO", then may proceed with Cephalosporin use.    Hydrocodone-Acetaminophen Itching and Rash     SOCIAL HISTORY:  Social History   Socioeconomic History   Marital status: Widowed    Spouse name: Not on file   Number of children: 1   Years of education: Not on file   Highest education level: 12th grade  Occupational History   Occupation: Retired  Tobacco Use   Smoking status: Former    Packs/day: 0.25    Years: 10.00    Additional pack years: 0.00    Total pack years: 2.50    Types: Cigarettes    Quit date: 1991    Years since quitting: 33.5     Passive exposure: Past   Smokeless tobacco: Never   Tobacco comments:    smoking cessation materials not required  Vaping Use   Vaping Use: Never used  Substance and Sexual Activity   Alcohol use: No    Alcohol/week: 0.0 standard drinks of alcohol   Drug use: No   Sexual activity: Not Currently  Other Topics Concern   Not on file  Social History Narrative   Not on file   Social Determinants of Health   Financial Resource Strain: Low Risk  (12/08/2021)   Overall Financial Resource Strain (CARDIA)    Difficulty of Paying Living Expenses: Not hard at all  Food Insecurity: No Food Insecurity (12/08/2021)   Hunger Vital Sign    Worried About Running Out of Food  in the Last Year: Never true    Ran Out of Food in the Last Year: Never true  Transportation Needs: No Transportation Needs (12/08/2021)   PRAPARE - Administrator, Civil Service (Medical): No    Lack of Transportation (Non-Medical): No  Physical Activity: Inactive (12/08/2021)   Exercise Vital Sign    Days of Exercise per Week: 0 days    Minutes of Exercise per Session: 0 min  Stress: No Stress Concern Present (12/08/2021)   [employer] of Occupational Health - Occupational Stress Questionnaire    Feeling of Stress : Not at all  Social Connections: Moderately Isolated (12/08/2021)   Social Connection and Isolation Panel [NHANES]    Frequency of Communication with Friends and Family: More than three times a week    Frequency of Social Gatherings with Friends and Family: More than three times a week    Attends Religious Services: More than 4 times per year    Active Member of [employer] or Organizations: No    Attends Banker Meetings: Never    Marital Status: Widowed  Intimate Partner Violence: Not At Risk (12/08/2021)   Humiliation, Afraid, Rape, and Kick questionnaire    Fear of Current or Ex-Partner: No    Emotionally Abused: No    Physically Abused: No    Sexually Abused: No    The patient  currently resides (home / rehab facility / nursing home): Home The patient normally is (ambulatory / bedbound): Ambulatory  FAMILY HISTORY:  Family History  Problem Relation Age of Onset   Pulmonary embolism Mother    Healthy Father     Otherwise negative/non-contributory.  REVIEW OF SYSTEMS:  Review of Systems  Constitutional:  Negative for chills and fever.  Respiratory:  Negative for cough.   Cardiovascular:  Negative for chest pain and palpitations.  Gastrointestinal:  Negative for nausea and vomiting.  Skin:  Negative for itching and rash.       + Right Hip Abscess   All other systems reviewed and are negative.   VITAL SIGNS:  @VSRANGES @     Height: 5\' 5"  (165.1 cm) Weight: 194 lb (88 kg) BMI (Calculated): 32.28   PHYSICAL EXAM:  Physical Exam Vitals and nursing note reviewed. Exam conducted with a chaperone present.  Constitutional:      General: She is not in acute distress.    Appearance: Normal appearance. She is not ill-appearing.     Comments: Patient sitting up in bed, NAD. Daughter at bedside   HENT:     Head: Normocephalic and atraumatic.  Eyes:     General: No scleral icterus.    Conjunctiva/sclera: Conjunctivae normal.     Comments: Wearing Glasses   Cardiovascular:     Rate and Rhythm: Normal rate.     Pulses: Normal pulses.  Pulmonary:     Effort: Pulmonary effort is normal. No respiratory distress.  Genitourinary:    Comments: Deferred Skin:    General: Skin is warm and dry.     Findings: Abscess and erythema present.     Comments: ~2 x 1 cm area of erythema and tenderness to the lateral right hip, this is tender to palpation, there is an area of fluctuance present centrally.   Neurological:     General: No focal deficit present.     Mental Status: She is alert and oriented to person, place, and time.  Psychiatric:        Mood and Affect: Mood normal.  Behavior: Behavior normal.       Labs: No pertinent laboratory results    Imaging studies:  No pertinent imaging studies     Assessment/Plan:  89 y.o. female with superficial right hip skin abscess.   - Will plan on bedside I&D of right hip abscess - will document procedure separately  - All risks, benefits, and alternatives to above procedure(s) were discussed with the patient, all of her questions were answered to her expressed satisfaction, patient expresses she wishes to proceed, and informed consent was obtained.    - Agree with Bactrim DS; will take Cx and update if change in therapy needed - Reviewed wound care recommendations - Pain control prn; anticipate OTC medications should be sufficient    - I will see her again next week for wound check; instructed to call if any questions or concerns in the interim  All of the above recommendations were discussed with the patient and patient's family, and all of patient's and family's questions were answered to their expressed satisfaction.  Face-to-face time spent with the patient and care providers was 30 minutes, with more than 50% of the time spent counseling, educating, and coordinating care of the patient.    Thank you for the opportunity to participate in this patient's care.  -- Lynden Oxford, PA-C [facility] 09/12/2022, 2:16 PM M-F: 7am - 4pm

## 2022-09-12 NOTE — Progress Notes (Signed)
Date:  09/12/2022   Name:  Kaitlyn Oliver   DOB:  December 18, 1933   MRN:  [id number]   Chief Complaint: Abscess  Abscess This is a new problem. The current episode started in the past 7 days. The problem occurs daily. The problem has been gradually worsening. Pertinent negatives include no chest pain, chills, coughing, fatigue, fever or swollen glands. Associated symptoms comments: Erythema and tenderness. Nothing aggravates the symptoms. She has tried nothing for the symptoms. The treatment provided mild relief.    Lab Results  Component Value Date   NA 139 11/19/2020   K 3.2 (L) 11/19/2020   CO2 23 11/19/2020   GLUCOSE 125 (H) 11/19/2020   BUN 14 11/19/2020   CREATININE 0.78 11/19/2020   CALCIUM 9.4 11/19/2020   GFRNONAA >60 11/19/2020   Lab Results  Component Value Date   CHOL 227 (H) 02/15/2017   HDL 33 (L) 02/15/2017   LDLCALC Comment 02/15/2017   TRIG 534 (H) 02/15/2017   CHOLHDL 6.9 (H) 02/15/2017   Lab Results  Component Value Date   TSH 6.222 (H) 11/19/2020   Lab Results  Component Value Date   HGBA1C 5.6 01/01/2020   Lab Results  Component Value Date   WBC 5.1 11/19/2020   HGB 14.1 11/19/2020   HCT 39.8 11/19/2020   MCV 89.8 11/19/2020   PLT 142 (L) 11/19/2020   Lab Results  Component Value Date   ALT 30 07/27/2020   AST 36 07/27/2020   ALKPHOS 59 07/27/2020   BILITOT 0.8 07/27/2020   No results found for: "25OHVITD2", "25OHVITD3", "VD25OH"   Review of Systems  Constitutional:  Negative for chills, fatigue and fever.  HENT:  Negative for nosebleeds, rhinorrhea and sinus pressure.   Respiratory:  Negative for cough, choking, chest tightness and shortness of breath.   Cardiovascular:  Negative for chest pain, palpitations and leg swelling.  Gastrointestinal:  Negative for abdominal distention.  Endocrine: Negative for polydipsia and polyuria.  Genitourinary:  Negative for difficulty urinating, dysuria and hematuria.    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   Goals of care, counseling/discussion 01/26/2018   Small bowel tumor    Pancreatic cyst 01/15/2018   Anxiety and depression 01/15/2018   Bowel obstruction (HCC) 11/29/2017   Small bowel obstruction (HCC)    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/19/2017   Status post total knee replacement using cement, right 05/30/2017   GERD (gastroesophageal reflux disease) 12/22/2015   Adenomatous polyp 09/11/2014   Bilateral cataracts 09/11/2014   Gastric catarrh 09/11/2014   Gastroduodenal ulcer 09/11/2014   Hypothyroidism 09/11/2014   Cardiac murmur 10/08/2013   Accelerated hypertension 10/08/2013   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 Paper  tape ok    Amlodipine Swelling    Throat swelling   Asa [Aspirin] Itching   Hydralazine Other (See Comments)    Oliguria (low urine output)   Hydrochlorothiazide Other (See Comments)    Hypokalemia leading to AFib   Penicillin G Itching    Has patient had a PCN reaction causing immediate rash, facial/tongue/throat swelling, SOB or lightheadedness with hypotension: No Has patient had a PCN reaction causing severe rash involving mucus membranes or skin necrosis: No Has patient had a PCN reaction  that required hospitalization: No Has patient had a PCN reaction occurring within the last 10 years: No If all of the above answers are "NO", then may proceed with Cephalosporin use.    Hydrocodone-Acetaminophen Itching and Rash    Past Surgical History:  Procedure Laterality Date   BOWEL RESECTION  01/17/2018   Procedure: SMALL BOWEL RESECTION;  Surgeon: Leafy Ro, MD;  Location: [facility] OR[facility];  Service: General;;   BREAST SURGERY Bilateral 1991   mastectomy   CATARACT EXTRACTION Bilateral    CHOLECYSTECTOMY     COLONOSCOPY  2013   Dr Marva Panda   CORONARY STENT INTERVENTION N/A 05/06/2021   Procedure: CORONARY STENT INTERVENTION;  Surgeon: Marcina Millard, MD;  Location: [facility];  Service: Cardiovascular;  Laterality: N/A;   HALLUX VALGUS AUSTIN Right 06/03/2015   Procedure: HALLUX VALGUS AUSTIN;  Surgeon: Gwyneth Revels, DPM;  Location[facility];  Service: Podiatry;  Laterality: Right;  WITH POPLITEAL   KNEE ARTHROSCOPY WITH MEDIAL MENISECTOMY Right 08/30/2016   Procedure: KNEE ARTHROSCOPY WITH PARTIAL MEDIAL MENISECTOMY and Partial Lateral Menisectomy;  Surgeon: Kennedy Bucker, MD;  Location: [facility] OR[facility];  Service: Orthopedics;  Laterality: Right;   KNEE ARTHROSCOPY WITH SUBCHONDROPLASTY Right 08/30/2016   Procedure: KNEE ARTHROSCOPY WITH SUBCHONDROPLASTY;  Surgeon: Kennedy Bucker, MD;  Location: [facility];  Service: Orthopedics;  Laterality: Right;   LAPAROTOMY N/A 01/17/2018   Procedure: EXPLORATORY LAPAROTOMY;  Surgeon: Leafy Ro, MD;  Location: [facility] OR[facility];  Service: General;  Laterality: N/A;   LEFT HEART CATH AND CORONARY ANGIOGRAPHY Left 05/06/2021   Procedure: LEFT HEART CATH AND CORONARY ANGIOGRAPHY;  Surgeon: Marcina Millard, MD;  Location: [facility];  Service: Cardiovascular;  Laterality: Left;   MASTECTOMY Bilateral    ROTATOR CUFF REPAIR Bilateral    TONSILLECTOMY     TOTAL KNEE ARTHROPLASTY Left    TOTAL KNEE ARTHROPLASTY Right  05/30/2017   Procedure: TOTAL KNEE ARTHROPLASTY;  Surgeon: Kennedy Bucker, MD;  Location: [facility];  Service: Orthopedics;  Laterality: Right;   VAGINAL HYSTERECTOMY      Social History   Tobacco Use   Smoking status: Former    Packs/day: 0.25    Years: 10.00    Additional pack years: 0.00    Total pack years: 2.50    Types: Cigarettes    Quit date: 1991    Years since quitting: 33.5   Smokeless tobacco: Never   Tobacco comments:    smoking cessation materials not required  Vaping Use   Vaping Use: Never used  Substance Use Topics   Alcohol use: No    Alcohol/week: 0.0 standard drinks of alcohol   Drug use: No     Medication list has been reviewed and updated.  Current Meds  Medication Sig   benzonatate (TESSALON PERLES) 100 MG capsule Take 1 capsule (100 mg total) by mouth 3 (three) times daily as needed for cough.   clopidogrel (PLAVIX) 75 MG tablet Take 1 tablet (75  mg total) by mouth daily with breakfast.   EUTHYROX 50 MCG tablet Take 50 mcg by mouth daily before breakfast. Dr Gershon Crane   isosorbide mononitrate (IMDUR) 60 MG 24 hr tablet Take 1 tablet (60 mg total) by mouth daily.   metoprolol succinate (TOPROL-XL) 25 MG 24 hr tablet Take 1 tablet (25 mg total) by mouth daily.   omeprazole (PRILOSEC) 20 MG capsule Take 1 capsule (20 mg total) by mouth daily.   sertraline (ZOLOFT) 25 MG tablet Take 1 tablet (25 mg total) by mouth daily.       09/12/2022   11:05 AM 08/30/2022   10:46 AM 08/15/2022   11:08 AM 11/23/2021    2:20 PM  GAD 7 : Generalized Anxiety Score  Nervous, Anxious, on Edge 0 0 0 0  Control/stop worrying 0 0 0 0  Worry too much - different things 0 0 0 0  Trouble relaxing 0 0 0 0  Restless 0 0 0 0  Easily annoyed or irritable 0 0 0 0  Afraid - awful might happen 0 0 0 0  Total GAD 7 Score 0 0 0 0  Anxiety Difficulty Not difficult at all Not difficult at all Not difficult at all Not difficult at all       09/12/2022   11:04 AM 08/30/2022   10:45 AM  08/15/2022   11:08 AM  Depression screen PHQ 2/9  Decreased Interest 0 0 0  Down, Depressed, Hopeless 0 0 0  PHQ - 2 Score 0 0 0  Altered sleeping 0 0 0  Tired, decreased energy 0 0 0  Change in appetite 0 0 0  Feeling bad or failure about yourself  0 0 0  Trouble concentrating 0 0 0  Moving slowly or fidgety/restless 0 0 0  Suicidal thoughts 0 0 0  PHQ-9 Score 0 0 0  Difficult doing work/chores Not difficult at all Not difficult at all Not difficult at all    BP Readings from Last 3 Encounters:  09/12/22 120/78  08/30/22 128/78  08/15/22 122/76    Physical Exam Cardiovascular:     Rate and Rhythm: Normal rate and regular rhythm.     Heart sounds: No murmur heard.    No friction rub. No gallop.  Pulmonary:     Breath sounds: No wheezing, rhonchi or rales.  Musculoskeletal:     Cervical back: Normal range of motion.  Skin:    Findings: Erythema present.     Comments: abscess  Neurological:     Mental Status: She is alert.     Wt Readings from Last 3 Encounters:  09/12/22 195 lb (88.5 kg)  08/30/22 192 lb (87.1 kg)  08/15/22 201 lb (91.2 kg)    BP 120/78   Pulse 64   Ht 5\' 5"  (1.651 m)   Wt 195 lb (88.5 kg)   SpO2 96%   BMI 32.45 kg/m   Assessment and Plan:  1. Abscess New onset.  Persistent.  Relatively stable.  But localized to a 2 x 1-1/2 cm area.  This is consistent with a superficial abscess likely secondary from an insect bite perhaps we will start on Septra DS twice daily and call and refer to general surgery for likely I&D. - sulfamethoxazole-trimethoprim (BACTRIM DS) 800-160 MG tablet; Take 1 tablet by mouth 2 (two) times daily for 10 days.  Dispense: 20 tablet; Refill: 0    Elizabeth Sauer, MD

## 2022-09-12 NOTE — Progress Notes (Addendum)
Procedure Note  Date: 09/12/22 2:16 PM  Preforming Provider: Lynden Oxford, PA-C  Pre-Procedure Diagnosis: Superficial Right Hip Abscess  Post-Procedure Diagnosis: Same  Anesthesia: 7 ccs of 1% lidocaine with epinephrine mixed with bicarb  Findings: Scant amount of purulent drainage and cystic contents   Details of Procedure:  All risks, benefits, and alternatives to above procedure(s) were discussed with the patient and informed consent was obtained. Right hip was prepped and draped in standard sterile fashion. 7 ccs of 1% lidocaine with epinephrine mixed with bicarbonate was injected intradermally and adequate anesthesia achieved. Using an 11 blade scalpel, a cruciate incision was made over the area of most fluctuance. Purulent drainage and cystic contents were expressed from the wound. Cultures were taken. Loculations were broken using forceps. The wound was then irrigated with copious amount of NS and packed with 1/2 inch packing strips. The patient tolerated this well without immediate complications.   Complications: None apparent  --  Lynden Oxford, PA-C [facility] 09/12/2022, 2:16 PM M-F: 7am - 4pm

## 2022-09-14 ENCOUNTER — Ambulatory Visit: Payer: Self-pay | Admitting: Physician Assistant

## 2022-09-17 LAB — ANAEROBIC AND AEROBIC CULTURE

## 2022-09-19 LAB — ANAEROBIC AND AEROBIC CULTURE

## 2022-09-20 ENCOUNTER — Ambulatory Visit: Payer: Medicare HMO | Admitting: Physician Assistant

## 2022-09-20 ENCOUNTER — Encounter: Payer: Self-pay | Admitting: Physician Assistant

## 2022-09-20 VITALS — BP 199/85 | HR 60 | Temp 98.0°F | Ht 65.0 in | Wt 194.6 lb

## 2022-09-20 DIAGNOSIS — L02415 Cutaneous abscess of right lower limb: Secondary | ICD-10-CM

## 2022-09-20 DIAGNOSIS — Z09 Encounter for follow-up examination after completed treatment for conditions other than malignant neoplasm: Secondary | ICD-10-CM

## 2022-09-20 DIAGNOSIS — L02219 Cutaneous abscess of trunk, unspecified: Secondary | ICD-10-CM

## 2022-09-20 NOTE — Patient Instructions (Signed)
Incision and Drainage, Care After After incision and drainage, it is common to have: Pain or discomfort around the incision site. Blood, fluid, or pus (drainage) from the incision. Redness and firm skin around the incision site. Follow these instructions at home: Medicines Take over-the-counter and prescription medicines only as told by your health care provider. If you were prescribed antibiotics, take them as told by your provider. Do not stop using the antibiotic even if you start to feel better. Do not apply creams, ointments, or liquids unless you have been told to by your provider. Wound care Follow instructions from your provider about how to take care of your wound. Make sure you: Wash your hands with soap and water for at least 20 seconds before and after you change your bandage (dressing). If soap and water are not available, use hand sanitizer. Change your dressing and any packing as told by your provider. If the dressing is dry or stuck when you try to remove it, moisten or wet it with saline or water. This will help you remove it without harming your skin or tissues. If your wound is packed, leave it in place until your provider tells you to remove it. To remove it, moisten or wet the packing with saline or water. Leave stitches (sutures), skin glue, or tape strips in place. These skin closures may need to stay in place for 2 weeks or longer. If tape strip edges start to loosen and curl up, you may trim the loose edges. Do not remove tape strips completely unless your provider tells you to do that. Check your wound every day for signs of infection. Check for: More redness, swelling, or pain. More fluid or blood. Warmth. Pus or a bad smell. If you were sent home with a drain tube in place, follow instructions from your provider about: How to empty it. How to care for it at home. Be careful when you get rid of used dressings, wound packing, or drainage. Activity Rest the  affected area. Return to your normal activities as told by your provider. Ask your provider what activities are safe for you. General instructions Do not use any products that contain nicotine or tobacco. These products include cigarettes, chewing tobacco, and vaping devices, such as e-cigarettes. These can delay incision healing after surgery. If you need help quitting, ask your provider. Do not take baths, swim, or use a hot tub until your provider approves. Ask your provider if you may take showers. You may only be allowed to take sponge baths. The incision will keep draining. It is normal to have some clear or slightly bloody drainage. The amount of drainage should go down each day. Keep all follow-up visits. Your provider will need to make sure that your incision is healing well and that there are no problems. Your health care provider may give you more instructions. Make sure you know what you can and cannot do Contact a health care provider if: Your cyst or abscess comes back. You have any signs of infection. You notice red streaks that spread away from the incision site. You have a fever or chills. Get help right away if: You have severe pain or bleeding. You become short of breath. You have chest pain. You have signs of a severe infection. You may notice changes in your incision area, such as: Swelling that makes the skin feel hard. Numbness or tingling. Sudden increase in redness. Your skin color may change from red to purple, and then to dark   spots. Blisters, ulcers, or splitting of the skin. These symptoms may be an emergency. Get help right away. Call 911. Do not wait to see if the symptoms will go away. Do not drive yourself to the hospital. This information is not intended to replace advice given to you by your health care provider. Make sure you discuss any questions you have with your health care provider. Document Revised: 10/18/2021 Document Reviewed: 10/18/2021 Elsevier  Patient Education  2024 Elsevier Inc.  

## 2022-09-20 NOTE — Progress Notes (Signed)
[facility] POST-OP OFFICE VISIT  09/20/2022  HPI: Kaitlyn Oliver is a 89 y.o. female 8 days s/p incision and drainage of right hip abscess  She has done well No pain, fever, chills, drainage Cx without growth  Completed Abx No new issues nor complaints   Vital signs: BP (!) 199/85   Pulse 60   Temp 98 F (36.7 C) (Oral)   Ht 5\' 5"  (1.651 m)   Wt 194 lb 9.6 oz (88.3 kg)   SpO2 99%   BMI 32.38 kg/m    Physical Exam: Constitutional: Well appearing female, NAD Skin: I&D site to the right hip is well healed  Assessment/Plan: This is a 89 y.o. female 8 days s/p incision and drainage of right hip abscess   - Nothing further from surgical perspective  - She can follow up on as needed basis; She understands to call with questions/concerns  -- Lynden Oxford, PA-C [facility] 09/20/2022, 1:27 PM M-F: 7am - 4pm

## 2022-10-04 DIAGNOSIS — Z961 Presence of intraocular lens: Secondary | ICD-10-CM | POA: Diagnosis not present

## 2022-10-04 DIAGNOSIS — H353131 Nonexudative age-related macular degeneration, bilateral, early dry stage: Secondary | ICD-10-CM | POA: Diagnosis not present

## 2022-11-24 DIAGNOSIS — Z9013 Acquired absence of bilateral breasts and nipples: Secondary | ICD-10-CM | POA: Diagnosis not present

## 2022-11-24 DIAGNOSIS — C50111 Malignant neoplasm of central portion of right female breast: Secondary | ICD-10-CM | POA: Diagnosis not present

## 2022-11-24 DIAGNOSIS — C50112 Malignant neoplasm of central portion of left female breast: Secondary | ICD-10-CM | POA: Diagnosis not present

## 2022-11-25 ENCOUNTER — Telehealth: Payer: Self-pay | Admitting: Family Medicine

## 2022-11-25 NOTE — Telephone Encounter (Signed)
Called Kaitlyn Oliver let her know that Dr. Yetta Barre has been out of the office this week she will return Monday. She verbalized understanding.  KP

## 2022-11-25 NOTE — Telephone Encounter (Signed)
Kaitlyn Oliver's medical supply is calling in because she wants to know the status of a fax that was sent over on 11/23/22 in the late evening. Kaitlyn Oliver is requesting someone give her a call to let her know if the form hasn't been received. Please follow up with Kaitlyn Oliver. [phone]

## 2022-11-28 ENCOUNTER — Telehealth: Payer: Self-pay | Admitting: Family Medicine

## 2022-11-28 NOTE — Telephone Encounter (Signed)
Per dr Yetta Barre /left voice mail to have patient set up appointment.  we received request from [facility] for her mastectomy supplies, however they need recent notes to go with the order

## 2022-12-01 ENCOUNTER — Encounter: Payer: Self-pay | Admitting: Family Medicine

## 2022-12-01 ENCOUNTER — Ambulatory Visit (INDEPENDENT_AMBULATORY_CARE_PROVIDER_SITE_OTHER): Payer: Medicare HMO | Admitting: Family Medicine

## 2022-12-01 VITALS — BP 138/82 | HR 68 | Ht 65.0 in | Wt 197.0 lb

## 2022-12-01 DIAGNOSIS — Z23 Encounter for immunization: Secondary | ICD-10-CM

## 2022-12-01 DIAGNOSIS — C50111 Malignant neoplasm of central portion of right female breast: Secondary | ICD-10-CM

## 2022-12-01 DIAGNOSIS — Z9013 Acquired absence of bilateral breasts and nipples: Secondary | ICD-10-CM | POA: Diagnosis not present

## 2022-12-01 DIAGNOSIS — C50112 Malignant neoplasm of central portion of left female breast: Secondary | ICD-10-CM | POA: Diagnosis not present

## 2022-12-01 NOTE — Progress Notes (Signed)
Date:  12/01/2022   Name:  Kaitlyn Oliver   DOB:  12/06/1933   MRN:  [id number]   Chief Complaint: mastectomy bra (Needs bra supplies)  Patient is a 89 year old female who presents for a reevaluation s/p bilateral mastectomy. exam. The patient reports the following problems: mastectectomy supplies. Health maintenance has been reviewed up to date.      Lab Results  Component Value Date   NA 139 11/19/2020   K 3.2 (L) 11/19/2020   CO2 23 11/19/2020   GLUCOSE 125 (H) 11/19/2020   BUN 14 11/19/2020   CREATININE 0.78 11/19/2020   CALCIUM 9.4 11/19/2020   GFRNONAA >60 11/19/2020   Lab Results  Component Value Date   CHOL 227 (H) 02/15/2017   HDL 33 (L) 02/15/2017   LDLCALC Comment 02/15/2017   TRIG 534 (H) 02/15/2017   CHOLHDL 6.9 (H) 02/15/2017   Lab Results  Component Value Date   TSH 6.222 (H) 11/19/2020   Lab Results  Component Value Date   HGBA1C 5.6 01/01/2020   Lab Results  Component Value Date   WBC 5.1 11/19/2020   HGB 14.1 11/19/2020   HCT 39.8 11/19/2020   MCV 89.8 11/19/2020   PLT 142 (L) 11/19/2020   Lab Results  Component Value Date   ALT 30 07/27/2020   AST 36 07/27/2020   ALKPHOS 59 07/27/2020   BILITOT 0.8 07/27/2020   No results found for: "25OHVITD2", "25OHVITD3", "VD25OH"   Review of Systems  Constitutional: Negative.  Negative for chills and unexpected weight change.  HENT:  Negative for rhinorrhea and sore throat.   Respiratory:  Negative for shortness of breath and wheezing.   Gastrointestinal:  Negative for abdominal pain.  Endocrine: Negative for polydipsia and polyuria.  Genitourinary:  Negative for difficulty urinating.  Musculoskeletal:  Negative for arthralgias, back pain and myalgias.  Skin:  Negative for rash.  Neurological:  Negative for dizziness, weakness and headaches.  Hematological:  Negative for adenopathy. Does not bruise/bleed easily.  Psychiatric/Behavioral:  Negative for dysphoric mood. The patient is not  nervous/anxious.     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   Goals of care, counseling/discussion 01/26/2018   Small bowel tumor    Pancreatic cyst 01/15/2018   Anxiety and depression 01/15/2018   Bowel obstruction (HCC) 11/29/2017   Small bowel obstruction (HCC)    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/19/2017   Status post total knee replacement using cement, right 05/30/2017   GERD (gastroesophageal reflux disease) 12/22/2015   Adenomatous polyp 09/11/2014   Bilateral cataracts 09/11/2014   Gastric catarrh 09/11/2014   Gastroduodenal ulcer 09/11/2014   Hypothyroidism 09/11/2014   Cardiac murmur 10/08/2013   Accelerated hypertension 10/08/2013   Combined fat and carbohydrate induced hyperlipemia 10/08/2013    Allergies  Allergen Reactions   Nitroglycerin Other (See Comments)    Bottoms out BP to 60's    Nitroglycerin In D5w Other (See Comments)    Bottoms out BP    Valsartan Other (See Comments)    Dizziness   Adhesive [Tape]  Dermatitis    Can not use plastic tape. Paper tape ok    Amlodipine Swelling    Throat swelling   Asa [Aspirin] Itching   Hydralazine Other (See Comments)    Oliguria (low urine output)   Hydrochlorothiazide Other (See Comments)    Hypokalemia leading to AFib   Penicillin G Itching    Has patient had a PCN reaction causing immediate rash, facial/tongue/throat swelling, SOB or lightheadedness with hypotension: No Has patient had a PCN reaction causing severe rash involving mucus membranes or skin  necrosis: No Has patient had a PCN reaction that required hospitalization: No Has patient had a PCN reaction occurring within the last 10 years: No If all of the above answers are "NO", then may proceed with Cephalosporin use.    Hydrocodone-Acetaminophen Itching and Rash    Past Surgical History:  Procedure Laterality Date   BOWEL RESECTION  01/17/2018   Procedure: SMALL BOWEL RESECTION;  Surgeon: Leafy Ro, MD;  Location: [facility];  Service: General;;   BREAST SURGERY Bilateral 1991   mastectomy   CATARACT EXTRACTION Bilateral    CHOLECYSTECTOMY     COLONOSCOPY  2013   Dr Marva Panda   CORONARY STENT INTERVENTION N/A 05/06/2021   Procedure: CORONARY STENT INTERVENTION;  Surgeon: Marcina Millard, MD;  Location: [facility];  Service: Cardiovascular;  Laterality: N/A;   HALLUX VALGUS AUSTIN Right 06/03/2015   Procedure: HALLUX VALGUS AUSTIN;  Surgeon: Gwyneth Revels, DPM;  Location: [facility];  Service: Podiatry;  Laterality: Right;  WITH POPLITEAL   KNEE ARTHROSCOPY WITH MEDIAL MENISECTOMY Right 08/30/2016   Procedure: KNEE ARTHROSCOPY WITH PARTIAL MEDIAL MENISECTOMY and Partial Lateral Menisectomy;  Surgeon: Kennedy Bucker, MD;  Location: [facility];  Service: Orthopedics;  Laterality: Right;   KNEE ARTHROSCOPY WITH SUBCHONDROPLASTY Right 08/30/2016   Procedure: KNEE ARTHROSCOPY WITH SUBCHONDROPLASTY;  Surgeon: Kennedy Bucker, MD;  Location: [facility];  Service: Orthopedics;  Laterality: Right;   LAPAROTOMY N/A 01/17/2018   Procedure: EXPLORATORY LAPAROTOMY;  Surgeon: Leafy Ro, MD;  Location: [facility];  Service: General;  Laterality: N/A;   LEFT HEART CATH AND CORONARY ANGIOGRAPHY Left 05/06/2021   Procedure: LEFT HEART CATH AND CORONARY ANGIOGRAPHY;  Surgeon: Marcina Millard, MD;  Location: [facility];  Service: Cardiovascular;  Laterality: Left;   MASTECTOMY Bilateral    ROTATOR CUFF REPAIR Bilateral    TONSILLECTOMY     TOTAL KNEE ARTHROPLASTY  Left    TOTAL KNEE ARTHROPLASTY Right 05/30/2017   Procedure: TOTAL KNEE ARTHROPLASTY;  Surgeon: Kennedy Bucker, MD;  Location: [facility];  Service: Orthopedics;  Laterality: Right;   VAGINAL HYSTERECTOMY      Social History   Tobacco Use   Smoking status: Former    Current packs/day: 0.00    Average packs/day: 0.3 packs/day for 10.0 years (2.5 ttl pk-yrs)    Types: Cigarettes    Start date: 42    Quit date: 96    Years since quitting: 33.7    Passive exposure: Past   Smokeless tobacco: Never   Tobacco comments:    smoking cessation materials not required  Vaping Use   Vaping status: Never Used  Substance Use Topics   Alcohol use: No    Alcohol/week: 0.0 standard drinks of alcohol   Drug use: No     Medication list has been reviewed and updated.  No outpatient medications have been marked as taking for the 12/01/22 encounter (Office Visit) with Duanne Limerick, MD.  12/01/2022   10:05 AM 09/12/2022   11:05 AM 08/30/2022   10:46 AM 08/15/2022   11:08 AM  GAD 7 : Generalized Anxiety Score  Nervous, Anxious, on Edge 0 0 0 0  Control/stop worrying 0 0 0 0  Worry too much - different things 0 0 0 0  Trouble relaxing 0 0 0 0  Restless 0 0 0 0  Easily annoyed or irritable 0 0 0 0  Afraid - awful might happen 0 0 0 0  Total GAD 7 Score 0 0 0 0  Anxiety Difficulty Not difficult at all Not difficult at all Not difficult at all Not difficult at all       12/01/2022   10:05 AM 09/12/2022   11:04 AM 08/30/2022   10:45 AM  Depression screen PHQ 2/9  Decreased Interest 0 0 0  Down, Depressed, Hopeless 0 0 0  PHQ - 2 Score 0 0 0  Altered sleeping 0 0 0  Tired, decreased energy 0 0 0  Change in appetite 0 0 0  Feeling bad or failure about yourself  0 0 0  Trouble concentrating 0 0 0  Moving slowly or fidgety/restless 0 0 0  Suicidal thoughts 0 0 0  PHQ-9 Score 0 0 0  Difficult doing work/chores Not difficult at all Not difficult at all Not difficult at all    BP  Readings from Last 3 Encounters:  12/01/22 138/82  09/20/22 (!) 199/85  09/12/22 (!) 169/80    Physical Exam Vitals and nursing note reviewed. Exam conducted with a chaperone present.  Constitutional:      General: She is not in acute distress. HENT:     Head: Normocephalic and atraumatic.     Right Ear: Tympanic membrane and external ear normal.     Left Ear: Tympanic membrane and external ear normal.     Nose: Nose normal. No rhinorrhea.  Eyes:     Pupils: Pupils are equal, round, and reactive to light.  Neck:     Thyroid: No thyromegaly.     Vascular: No JVD.  Cardiovascular:     Rate and Rhythm: Normal rate and regular rhythm.     Heart sounds: Normal heart sounds. No murmur heard.    No friction rub. No gallop.  Pulmonary:     Effort: Pulmonary effort is normal.     Breath sounds: Normal breath sounds. No wheezing, rhonchi or rales.  Chest:     Chest wall: No tenderness.  Breasts:    Right: Absent.     Left: Absent.  Abdominal:     Palpations: Abdomen is soft.  Musculoskeletal:        General: Normal range of motion.     Cervical back: Normal range of motion and neck supple.  Skin:    General: Skin is warm.  Neurological:     Mental Status: She is alert.     Deep Tendon Reflexes: Reflexes are normal and symmetric.     Wt Readings from Last 3 Encounters:  12/01/22 197 lb (89.4 kg)  09/20/22 194 lb 9.6 oz (88.3 kg)  09/12/22 194 lb (88 kg)    BP 138/82   Pulse 68   Ht 5\' 5"  (1.651 m)   Wt 197 lb (89.4 kg)   SpO2 98%   BMI 32.78 kg/m   Assessment and Plan: . 1. Malignant neoplasm of central portion of right female breast, unspecified estrogen receptor status (HCC) Chronic.  Controlled.  Stable.  Patient with  history of mastectomy for breast cancer approximately 33 years ago with well-healing and need for mastectomy supplies  2. Malignant neoplasm of central portion of left female breast, unspecified estrogen receptor status (HCC) Chronic controlled.   Stable patient with history of breast cancer of left breast with well-healing and need for mastectomy supplies  3. Status post bilateral mastectomy Bilateral mastectomy well-healing with normal postmastectomy healing.  Patient needs mastectomy supplies as provided in the past the last of which was 2017.  4. Flu vaccine need Discussed and intermittent - Flu Vaccine Trivalent High Dose (Fluad)    Elizabeth Sauer, MD

## 2022-12-07 ENCOUNTER — Telehealth: Payer: Self-pay | Admitting: Family Medicine

## 2022-12-07 NOTE — Telephone Encounter (Signed)
Copied from CRM [id number]. Topic: General - Inquiry >> Dec 07, 2022  3:32 PM De [place]anch wrote: Reason for CRM: Pt is requesting a callback from [place]. She stated she needs to talk to her and declined to provide further details. Please advise.

## 2022-12-08 ENCOUNTER — Telehealth: Payer: Self-pay

## 2022-12-08 NOTE — Telephone Encounter (Signed)
Spoke to pt- called concerning a house call the sheriff's dept went to

## 2022-12-13 DIAGNOSIS — L570 Actinic keratosis: Secondary | ICD-10-CM | POA: Diagnosis not present

## 2022-12-13 DIAGNOSIS — D2271 Melanocytic nevi of right lower limb, including hip: Secondary | ICD-10-CM | POA: Diagnosis not present

## 2022-12-13 DIAGNOSIS — D2261 Melanocytic nevi of right upper limb, including shoulder: Secondary | ICD-10-CM | POA: Diagnosis not present

## 2022-12-13 DIAGNOSIS — L820 Inflamed seborrheic keratosis: Secondary | ICD-10-CM | POA: Diagnosis not present

## 2022-12-13 DIAGNOSIS — D2262 Melanocytic nevi of left upper limb, including shoulder: Secondary | ICD-10-CM | POA: Diagnosis not present

## 2022-12-13 DIAGNOSIS — R208 Other disturbances of skin sensation: Secondary | ICD-10-CM | POA: Diagnosis not present

## 2022-12-13 DIAGNOSIS — D225 Melanocytic nevi of trunk: Secondary | ICD-10-CM | POA: Diagnosis not present

## 2022-12-13 DIAGNOSIS — D2272 Melanocytic nevi of left lower limb, including hip: Secondary | ICD-10-CM | POA: Diagnosis not present

## 2022-12-13 DIAGNOSIS — L821 Other seborrheic keratosis: Secondary | ICD-10-CM | POA: Diagnosis not present

## 2022-12-13 DIAGNOSIS — L2989 Other pruritus: Secondary | ICD-10-CM | POA: Diagnosis not present

## 2023-01-03 DIAGNOSIS — E782 Mixed hyperlipidemia: Secondary | ICD-10-CM | POA: Diagnosis not present

## 2023-01-03 DIAGNOSIS — I10 Essential (primary) hypertension: Secondary | ICD-10-CM | POA: Diagnosis not present

## 2023-01-03 DIAGNOSIS — I7121 Aneurysm of the ascending aorta, without rupture: Secondary | ICD-10-CM | POA: Diagnosis not present

## 2023-01-03 DIAGNOSIS — R002 Palpitations: Secondary | ICD-10-CM | POA: Diagnosis not present

## 2023-01-03 DIAGNOSIS — I25118 Atherosclerotic heart disease of native coronary artery with other forms of angina pectoris: Secondary | ICD-10-CM | POA: Diagnosis not present

## 2023-01-03 DIAGNOSIS — I480 Paroxysmal atrial fibrillation: Secondary | ICD-10-CM | POA: Diagnosis not present

## 2023-01-16 DIAGNOSIS — H903 Sensorineural hearing loss, bilateral: Secondary | ICD-10-CM | POA: Diagnosis not present

## 2023-01-16 DIAGNOSIS — M26609 Unspecified temporomandibular joint disorder, unspecified side: Secondary | ICD-10-CM | POA: Diagnosis not present

## 2023-01-16 DIAGNOSIS — H6122 Impacted cerumen, left ear: Secondary | ICD-10-CM | POA: Diagnosis not present

## 2023-01-16 DIAGNOSIS — R430 Anosmia: Secondary | ICD-10-CM | POA: Diagnosis not present

## 2023-02-13 DIAGNOSIS — F5101 Primary insomnia: Secondary | ICD-10-CM | POA: Diagnosis not present

## 2023-02-13 DIAGNOSIS — I10 Essential (primary) hypertension: Secondary | ICD-10-CM | POA: Diagnosis not present

## 2023-02-13 DIAGNOSIS — I480 Paroxysmal atrial fibrillation: Secondary | ICD-10-CM | POA: Diagnosis not present

## 2023-02-13 DIAGNOSIS — Z0000 Encounter for general adult medical examination without abnormal findings: Secondary | ICD-10-CM | POA: Diagnosis not present

## 2023-03-06 ENCOUNTER — Other Ambulatory Visit: Payer: Self-pay | Admitting: Family Medicine

## 2023-03-06 DIAGNOSIS — K219 Gastro-esophageal reflux disease without esophagitis: Secondary | ICD-10-CM

## 2023-03-20 ENCOUNTER — Other Ambulatory Visit: Payer: Self-pay | Admitting: Family Medicine

## 2023-03-20 DIAGNOSIS — K219 Gastro-esophageal reflux disease without esophagitis: Secondary | ICD-10-CM

## 2023-04-04 DIAGNOSIS — J069 Acute upper respiratory infection, unspecified: Secondary | ICD-10-CM | POA: Diagnosis not present

## 2023-04-11 ENCOUNTER — Other Ambulatory Visit
Admission: RE | Admit: 2023-04-11 | Discharge: 2023-04-11 | Disposition: A | Discharge status: Home / Self Care | Payer: Medicare HMO | Source: Ambulatory Visit | Attending: Ophthalmology | Admitting: Ophthalmology

## 2023-04-11 ENCOUNTER — Other Ambulatory Visit: Payer: Self-pay | Admitting: Ophthalmology

## 2023-04-11 DIAGNOSIS — H532 Diplopia: Secondary | ICD-10-CM

## 2023-04-11 DIAGNOSIS — Z961 Presence of intraocular lens: Secondary | ICD-10-CM | POA: Diagnosis not present

## 2023-04-11 LAB — CBC WITH DIFFERENTIAL/PLATELET
Abs Immature Granulocytes: 0.03 10*3/uL (ref 0.00–0.07)
Basophils Absolute: 0 10*3/uL (ref 0.0–0.1)
Basophils Relative: 1 %
Eosinophils Absolute: 0.1 10*3/uL (ref 0.0–0.5)
Eosinophils Relative: 1 %
HCT: 36.5 % (ref 36.0–46.0)
Hemoglobin: 12.3 g/dL (ref 12.0–15.0)
Immature Granulocytes: 1 %
Lymphocytes Relative: 31 %
Lymphs Abs: 1.7 10*3/uL (ref 0.7–4.0)
MCH: 30.6 pg (ref 26.0–34.0)
MCHC: 33.7 g/dL (ref 30.0–36.0)
MCV: 90.8 fL (ref 80.0–100.0)
Monocytes Absolute: 0.4 10*3/uL (ref 0.1–1.0)
Monocytes Relative: 7 %
Neutro Abs: 3.3 10*3/uL (ref 1.7–7.7)
Neutrophils Relative %: 59 %
Platelets: 144 10*3/uL — ABNORMAL LOW (ref 150–400)
RBC: 4.02 MIL/uL (ref 3.87–5.11)
RDW: 14.4 % (ref 11.5–15.5)
WBC: 5.5 10*3/uL (ref 4.0–10.5)
nRBC: 0 % (ref 0.0–0.2)

## 2023-04-11 LAB — C-REACTIVE PROTEIN: CRP: 0.9 mg/dL (ref <1.0)

## 2023-04-11 LAB — SEDIMENTATION RATE: Sed Rate: 9 mm/h (ref 0–30)

## 2023-04-12 DIAGNOSIS — L538 Other specified erythematous conditions: Secondary | ICD-10-CM | POA: Diagnosis not present

## 2023-04-12 DIAGNOSIS — L570 Actinic keratosis: Secondary | ICD-10-CM | POA: Diagnosis not present

## 2023-04-12 DIAGNOSIS — D485 Neoplasm of uncertain behavior of skin: Secondary | ICD-10-CM | POA: Diagnosis not present

## 2023-04-12 DIAGNOSIS — L720 Epidermal cyst: Secondary | ICD-10-CM | POA: Diagnosis not present

## 2023-04-12 DIAGNOSIS — L820 Inflamed seborrheic keratosis: Secondary | ICD-10-CM | POA: Diagnosis not present

## 2023-04-12 DIAGNOSIS — L2989 Other pruritus: Secondary | ICD-10-CM | POA: Diagnosis not present

## 2023-04-15 ENCOUNTER — Ambulatory Visit
Admission: RE | Admit: 2023-04-15 | Discharge: 2023-04-15 | Disposition: A | Discharge status: Home / Self Care | Payer: Medicare HMO | Source: Ambulatory Visit | Attending: Ophthalmology | Admitting: Ophthalmology

## 2023-04-15 DIAGNOSIS — H532 Diplopia: Secondary | ICD-10-CM | POA: Insufficient documentation

## 2023-04-15 DIAGNOSIS — I6782 Cerebral ischemia: Secondary | ICD-10-CM | POA: Diagnosis not present

## 2023-04-15 DIAGNOSIS — D320 Benign neoplasm of cerebral meninges: Secondary | ICD-10-CM | POA: Diagnosis not present

## 2023-04-15 DIAGNOSIS — R519 Headache, unspecified: Secondary | ICD-10-CM | POA: Diagnosis not present

## 2023-04-15 DIAGNOSIS — C50919 Malignant neoplasm of unspecified site of unspecified female breast: Secondary | ICD-10-CM | POA: Diagnosis not present

## 2023-04-15 MED ORDER — GADOBUTROL 1 MMOL/ML IV SOLN
9.0000 mL | Freq: Once | INTRAVENOUS | Status: AC | PRN
  Administered 2023-04-15: 9 mL via INTRAVENOUS

## 2023-04-18 DIAGNOSIS — H532 Diplopia: Secondary | ICD-10-CM | POA: Diagnosis not present

## 2023-04-27 ENCOUNTER — Emergency Department: Payer: Medicare HMO

## 2023-04-27 ENCOUNTER — Other Ambulatory Visit: Payer: Self-pay

## 2023-04-27 ENCOUNTER — Emergency Department
Admission: EM | Admit: 2023-04-27 | Discharge: 2023-04-27 | Disposition: A | Discharge status: Home / Self Care | Payer: Medicare HMO | Attending: Emergency Medicine | Admitting: Emergency Medicine

## 2023-04-27 DIAGNOSIS — R0789 Other chest pain: Secondary | ICD-10-CM | POA: Diagnosis not present

## 2023-04-27 DIAGNOSIS — I10 Essential (primary) hypertension: Secondary | ICD-10-CM | POA: Diagnosis not present

## 2023-04-27 DIAGNOSIS — R0602 Shortness of breath: Secondary | ICD-10-CM | POA: Diagnosis not present

## 2023-04-27 DIAGNOSIS — R079 Chest pain, unspecified: Secondary | ICD-10-CM | POA: Diagnosis not present

## 2023-04-27 DIAGNOSIS — R9431 Abnormal electrocardiogram [ECG] [EKG]: Secondary | ICD-10-CM | POA: Diagnosis not present

## 2023-04-27 DIAGNOSIS — R918 Other nonspecific abnormal finding of lung field: Secondary | ICD-10-CM | POA: Diagnosis not present

## 2023-04-27 DIAGNOSIS — Z743 Need for continuous supervision: Secondary | ICD-10-CM | POA: Diagnosis not present

## 2023-04-27 LAB — BASIC METABOLIC PANEL
Anion gap: 11 (ref 5–15)
BUN: 13 mg/dL (ref 8–23)
CO2: 25 mmol/L (ref 22–32)
Calcium: 9.6 mg/dL (ref 8.9–10.3)
Chloride: 103 mmol/L (ref 98–111)
Creatinine, Ser: 0.82 mg/dL (ref 0.44–1.00)
GFR, Estimated: >60 mL/min (ref >60)
Glucose, Bld: 121 mg/dL — ABNORMAL HIGH (ref 70–99)
Potassium: 3 mmol/L — ABNORMAL LOW (ref 3.5–5.1)
Sodium: 139 mmol/L (ref 135–145)

## 2023-04-27 LAB — CBC
HCT: 36.5 % (ref 36.0–46.0)
Hemoglobin: 12.2 g/dL (ref 12.0–15.0)
MCH: 30.9 pg (ref 26.0–34.0)
MCHC: 33.4 g/dL (ref 30.0–36.0)
MCV: 92.4 fL (ref 80.0–100.0)
Platelets: 153 10*3/uL (ref 150–400)
RBC: 3.95 MIL/uL (ref 3.87–5.11)
RDW: 14.6 % (ref 11.5–15.5)
WBC: 5.6 10*3/uL (ref 4.0–10.5)
nRBC: 0 % (ref 0.0–0.2)

## 2023-04-27 LAB — TROPONIN I (HIGH SENSITIVITY)
Troponin I (High Sensitivity): 16 ng/L (ref <18)
Troponin I (High Sensitivity): 17 ng/L (ref <18)

## 2023-04-27 LAB — MAGNESIUM: Magnesium: 2 mg/dL (ref 1.7–2.4)

## 2023-04-27 MED ORDER — POTASSIUM CHLORIDE CRYS ER 20 MEQ PO TBCR
40.0000 meq | EXTENDED_RELEASE_TABLET | Freq: Once | ORAL | Status: AC
  Administered 2023-04-27: 40 meq via ORAL
  Filled 2023-04-27: qty 2

## 2023-04-27 NOTE — ED Provider Notes (Signed)
[facility] Provider Note    Event Date/Time   First MD Initiated Contact with Patient 04/27/23 [phone]     (approximate)   History   Chief Complaint Chest Pain   HPI  Kaitlyn Oliver is a 89 y.o. female with past medical history of hypertension, atrial fibrillation, cirrhosis, and TAAA who presents to the ED complaining of chest pain.  Patient reports that she was woken from sleep with some difficulty breathing about 4 hours prior to arrival.  This was associated with some pressure in her chest and she eventually decided to call EMS.  Patient was told by EMS that she was going in and out of atrial fibrillation, patient denies any palpitations or feeling like her heart was racing.  She does state that since she arrived to the ED, her symptoms have resolved and she feels back to normal.  She went to bed feeling fine last night, denies any recent fevers, cough, pain or swelling in her legs.     Physical Exam   Triage Vital Signs: ED Triage Vitals  Encounter Vitals Group     BP 04/27/23 0903 (!) 153/85     Systolic BP Percentile --      Diastolic BP Percentile --      Pulse Rate 04/27/23 0900 (!) 59     Resp 04/27/23 0900 17     Temp 04/27/23 0903 98.2 F (36.8 C)     Temp Source 04/27/23 0903 Oral     SpO2 04/27/23 0903 95 %     Weight 04/27/23 0902 197 lb 1.5 oz (89.4 kg)     Height 04/27/23 0902 5\' 5"  (1.651 m)     Head Circumference --      Peak Flow --      Pain Score 04/27/23 0902 4     Pain Loc --      Pain Education --      Exclude from Growth Chart --     Most recent vital signs: Vitals:   04/27/23 0903 04/27/23 1030  BP: (!) 153/85 (!) 164/85  Pulse:    Resp:  17  Temp: 98.2 F (36.8 C)   SpO2: 95%     Constitutional: Alert and oriented. Eyes: Conjunctivae are normal. Head: Atraumatic. Nose: No congestion/rhinnorhea. Mouth/Throat: Mucous membranes are moist.  Cardiovascular: Normal rate, regular rhythm. Grossly normal heart  sounds.  2+ radial pulses bilaterally. Respiratory: Normal respiratory effort.  No retractions. Lungs CTAB.  No chest wall tenderness to palpation. Gastrointestinal: Soft and nontender. No distention. Musculoskeletal: No lower extremity tenderness nor edema.  Neurologic:  Normal speech and language. No gross focal neurologic deficits are appreciated.    ED Results / Procedures / Treatments   Labs (all labs ordered are listed, but only abnormal results are displayed) Labs Reviewed  BASIC METABOLIC PANEL - Abnormal; Notable for the following components:      Result Value   Potassium 3.0 (*)    Glucose, Bld 121 (*)    All other components within normal limits  CBC  MAGNESIUM  TROPONIN I (HIGH SENSITIVITY)  TROPONIN I (HIGH SENSITIVITY)     EKG  ED ECG REPORT I, Chesley Noon, the attending physician, personally viewed and interpreted this ECG.   Date: 04/27/2023  EKG Time: 9:02  Rate: 56  Rhythm: sinus bradycardia  Axis: LAD  Intervals:none  ST&T Change: None  RADIOLOGY Chest x-ray reviewed and interpreted by me with no infiltrate, edema, or effusion.  PROCEDURES:  Critical  Care performed: No  Procedures   MEDICATIONS ORDERED IN ED: Medications  potassium chloride SA (KLOR-CON M) CR tablet 40 mEq (40 mEq Oral Given 04/27/23 1034)     IMPRESSION / MDM / ASSESSMENT AND PLAN / ED COURSE  I reviewed the triage vital signs and the nursing notes.                              89 y.o. female with past medical history of hypertension, atrial fibrillation, cirrhosis, and TAAA who presents to the ED after being woken from sleep 4 hours prior to arrival with some difficulty breathing and pressure in her chest, now resolved.  Patient's presentation is most consistent with acute presentation with potential threat to life or bodily function.  Differential diagnosis includes, but is not limited to, arrhythmia, ACS, PE, pneumonia, pneumothorax, anemia, electrolyte  abnormality, AKI.  Patient nontoxic-appearing and in no acute distress, vital signs are unremarkable.  EKG shows no evidence of arrhythmia or ischemia and initial troponin within normal limits.  Patient reportedly was in atrial fibrillation intermittently with EMS, will observe on cardiac monitor and check second set troponin.  With resolution of her symptoms, I doubt dissection or PE.  Additional labs show mild hypokalemia which we will replete, add on magnesium level.  No significant anemia, leukocytosis, or AKI noted.  No events noted on cardiac monitor and patient remains asymptomatic here in the ED.  Repeat troponin is stable, patient is adamant that she be discharged home to follow-up as an outpatient with cardiology.  This is reasonable given reassuring workup, suspect episode may have been due to runs of atrial fibrillation but no A-fib seen here in the ED.  She was counseled to follow-up with cardiology and to return to the ED for new or worsening symptoms, patient agrees with plan.      FINAL CLINICAL IMPRESSION(S) / ED DIAGNOSES   Final diagnoses:  Nonspecific chest pain  Shortness of breath     Rx / DC Orders   ED Discharge Orders          Ordered    Ambulatory referral to Cardiology        04/27/23 1154             Note:  This document was prepared using Dragon voice recognition software and may include unintentional dictation errors.   Chesley Noon, MD 04/27/23 1155

## 2023-04-27 NOTE — ED Triage Notes (Signed)
Pt here from home with cp that started 45 mins ago. Pt had a blockage 2 years ago. Hx of a fib, has depression in V6. Pt states she is not having pain but it is more pressure. Pt denies NVD.    200/100 20G RAC 65

## 2023-05-02 DIAGNOSIS — G939 Disorder of brain, unspecified: Secondary | ICD-10-CM | POA: Diagnosis not present

## 2023-05-02 DIAGNOSIS — M5481 Occipital neuralgia: Secondary | ICD-10-CM | POA: Diagnosis not present

## 2023-06-01 ENCOUNTER — Ambulatory Visit: Payer: Self-pay | Admitting: Family Medicine

## 2023-07-17 DIAGNOSIS — I25118 Atherosclerotic heart disease of native coronary artery with other forms of angina pectoris: Secondary | ICD-10-CM | POA: Diagnosis not present

## 2023-07-17 DIAGNOSIS — E782 Mixed hyperlipidemia: Secondary | ICD-10-CM | POA: Diagnosis not present

## 2023-07-17 DIAGNOSIS — R002 Palpitations: Secondary | ICD-10-CM | POA: Diagnosis not present

## 2023-07-17 DIAGNOSIS — I10 Essential (primary) hypertension: Secondary | ICD-10-CM | POA: Diagnosis not present

## 2023-07-17 DIAGNOSIS — I7121 Aneurysm of the ascending aorta, without rupture: Secondary | ICD-10-CM | POA: Diagnosis not present

## 2023-07-17 DIAGNOSIS — I480 Paroxysmal atrial fibrillation: Secondary | ICD-10-CM | POA: Diagnosis not present

## 2023-07-24 DIAGNOSIS — L720 Epidermal cyst: Secondary | ICD-10-CM | POA: Diagnosis not present

## 2023-08-01 DIAGNOSIS — E559 Vitamin D deficiency, unspecified: Secondary | ICD-10-CM | POA: Diagnosis not present

## 2023-08-01 DIAGNOSIS — Z1331 Encounter for screening for depression: Secondary | ICD-10-CM | POA: Diagnosis not present

## 2023-08-01 DIAGNOSIS — M5481 Occipital neuralgia: Secondary | ICD-10-CM | POA: Diagnosis not present

## 2023-08-01 DIAGNOSIS — E538 Deficiency of other specified B group vitamins: Secondary | ICD-10-CM | POA: Diagnosis not present

## 2023-08-01 DIAGNOSIS — G939 Disorder of brain, unspecified: Secondary | ICD-10-CM | POA: Diagnosis not present

## 2023-08-21 DIAGNOSIS — I480 Paroxysmal atrial fibrillation: Secondary | ICD-10-CM | POA: Diagnosis not present

## 2023-08-21 DIAGNOSIS — Z1331 Encounter for screening for depression: Secondary | ICD-10-CM | POA: Diagnosis not present

## 2023-08-21 DIAGNOSIS — F5101 Primary insomnia: Secondary | ICD-10-CM | POA: Diagnosis not present

## 2023-08-21 DIAGNOSIS — I10 Essential (primary) hypertension: Secondary | ICD-10-CM | POA: Diagnosis not present

## 2023-08-21 NOTE — Progress Notes (Signed)
 Kaitlyn Oliver is a 89 y.o. female that comes today for the following problem(s):   Chief Complaint  Patient presents with   Follow-up   sciatic nerve    HPI: History of Present Illness Kaitlyn Oliver is an 89 year old female who presents for evaluation of vitamin deficiencies and sleep disturbances.  She has experienced chronic sciatic pain on the right side for the past 30 to 40 years, which is exacerbated by walking. The pain is not constant and is currently not severe. She has not pursued specific treatment recently but is open to exploring options if needed.  Her recent follow-up for vitamin D and B12 levels showed improvement with supplementation. Her vitamin D level, previously low, is now 39 ng/mL with weekly supplementation, which she continues. Her vitamin B12 level, also previously low, is now 400 pg/mL, and she continues to take B12 supplements. She experiences fatigue but feels less fatigued since starting vitamin D supplementation. She also takes 400 mg of magnesium  daily, which she believes has helped with her headaches.  She received injections in her head last week, which alleviated her headaches for the first time in a year. She noted that the PA who administered the shots did a better job than the previous doctor, as the PA massaged the shot area, which she thought helped distribute the medicine.  She has sleep disturbances, sometimes getting less than three hours of sleep per night. She recalls sleeping well when younger, needing about seven to eight hours to feel rested. She has tried mirtazapine (Remeron) in the past but does not recall taking it recently and is open to trying it again.  She maintains an active lifestyle, engaging in gardening and other activities, and reports feeling stronger since addressing her vitamin deficiencies. No recent falls, and she has good balance. She lives in [place], has a strong family support system, is active in her community, and  participates in church activities.    Patient Active Problem List  Diagnosis   Fatigue   Mixed hyperlipidemia   Cardiac murmur   Peptic ulcer disease   Cataracts, bilateral   Seasonal allergies   Gastritis   Adenomatous polyps   Acute chest pain   Benign essential hypertension   S/P TKR (total knee replacement) using cement, right   Ascending aortic aneurysm ()   Coronary artery disease involving native coronary artery of native heart   Heart palpitations   Paroxysmal atrial fibrillation (CMS/HHS-HCC)   Hypokalemia   Pedal edema     Past Medical History:  Diagnosis Date   Adenomatous polyps    Cancer (CMS/HHS-HCC)    Cataracts, bilateral    Chickenpox    Gastritis    GERD (gastroesophageal reflux disease)    Heart murmur    Hyperlipidemia    Hypertension    Peptic ulcer disease    Seasonal allergies      Past Surgical History:  Procedure Laterality Date   JOINT REPLACEMENT Left 09/08/2011   COLONOSCOPY  02/24/2012   06/22/2006, 03/22/2001   EGD  02/24/2012   10/15/2007, 06/22/2006, 03/22/2001   TOTAL KNEE ARTHROPLASTY (Right) Right 05/30/2017   Dr. Kathlynn   ABDOMINAL HYSTERECTOMY     cataract abstraction     CHOLECYSTECTOMY     COLONOSCOPY     MASTECTOMY BILATERAL MODIFIED RADICAL     TONSILLECTOMY     UPPER GASTROINTESTINAL ENDOSCOPY      Social History   Socioeconomic History   Marital status: Widowed  Tobacco Use  Smoking status: Former    Current packs/day: 0.00    Types: Cigarettes    Quit date: 10/08/1988    Years since quitting: 34.8   Smokeless tobacco: Never  Vaping Use   Vaping status: Never Used  Substance and Sexual Activity   Alcohol use: No   Drug use: No   Sexual activity: Yes   Social Drivers of Corporate Investment Banker Strain: Low Risk  (05/02/2023)   Overall Financial Resource Strain (CARDIA)    Difficulty of Paying Living Expenses: Not very hard  Food Insecurity: No Food  Insecurity (05/02/2023)   Hunger Vital Sign    Worried About Running Out of Food in the Last Year: Never true    Ran Out of Food in the Last Year: Never true  Transportation Needs: No Transportation Needs (05/02/2023)   PRAPARE - Administrator, Civil Service (Medical): No    Lack of Transportation (Non-Medical): No  Physical Activity: Inactive (12/08/2021)   Received from [facility]   Exercise Vital Sign    On average, how many days per week do you engage in moderate to strenuous exercise (like a brisk walk)?: 0 days    On average, how many minutes do you engage in exercise at this level?: 0 min  Stress: No Stress Concern Present (12/08/2021)   Received from [facility] - Occupational Stress Questionnaire    Feeling of Stress : Not at all  Social Connections: Moderately Isolated (12/08/2021)   Received from [facility]   Social Connection and Isolation Panel    In a typical week, how many times do you talk on the phone with family, friends, or neighbors?: More than three times a week    How often do you get together with friends or relatives?: More than three times a week    How often do you attend church or religious services?: More than 4 times per year    Do you belong to any clubs or organizations such as church groups, unions, fraternal or athletic groups, or school groups?: No    How often do you attend meetings of the clubs or organizations you belong to?: Never    Are you married, widowed, divorced, separated, never married, or living with a partner?: Widowed  Housing Stability: Low Risk  (05/02/2023)   Housing Stability Vital Sign    Unable to Pay for Housing in the Last Year: No    Number of Times Moved in the Last Year: 0    Homeless in the Last Year: No     Family History  Problem Relation Name Age of Onset   Coronary Artery Disease (Blocked arteries around heart) Mother     Alcohol abuse Mother      Sudden death (unexpected death due to unknown cause) Father     Aneurysm Father     Breast cancer Paternal Aunt     Cancer Brother     Tremor Maternal Grandmother     Myocardial Infarction (Heart attack) Maternal Grandfather        Current Outpatient Medications:    cyanocobalamin  (VITAMIN B12) 1000 MCG tablet, Take 1 tablet (1,000 mcg total) by mouth once daily, Disp: 90 tablet, Rfl: 3   ergocalciferol, vitamin D2, 1,250 mcg (50,000 unit) capsule, Take 1 capsule (50,000 Units total) by mouth once a week for 360 doses, Disp: 20 capsule, Rfl: 15   hydrALAZINE  (APRESOLINE ) 25 MG tablet, Take 1 tablet (25 mg  total) by mouth 3 (three) times daily, Disp: 270 tablet, Rfl: 1   isosorbide  mononitrate (IMDUR ) 60 MG ER tablet, Take 1 tablet by mouth once daily, Disp: 90 tablet, Rfl: 0   levothyroxine  (SYNTHROID ) 50 MCG tablet, Take 1 tablet (50 mcg total) by mouth every morning before breakfast (0630), Disp: 90 tablet, Rfl: 4   metoprolol  succinate (TOPROL -XL) 25 MG XL tablet, TAKE 1 TABLET BY MOUTH ONCE DAILY AS NEEDED (Patient taking differently: Take 25 mg by mouth once daily), Disp: 90 tablet, Rfl: 2   omeprazole  (PRILOSEC) 20 MG DR capsule, Take 1 capsule (20 mg total) by mouth once daily, Disp: 90 capsule, Rfl: 1   potassium chloride  (KLOR-CON ) 10 MEQ ER tablet, Take 2 tablets by mouth once daily, Disp: 180 tablet, Rfl: 1   mirtazapine (REMERON) 15 MG tablet, Take 1 tablet (15 mg total) by mouth at bedtime for 60 days, Disp: 30 tablet, Rfl: 1   ROS: Negative, sciatica type pain in right leg    Objective:  BP 122/84 (BP Location: Left upper arm, Patient Position: Sitting, BP Cuff Size: Large Adult)   Pulse 63   Ht 165.1 cm (5' 5)   Wt 87.1 kg (192 lb)   LMP  (LMP Unknown)   SpO2 96%   BMI 31.95 kg/m   Physical Exam MEASUREMENTS: Height- 100%, Weight- 100%.    Physical Examination:  GENERAL:  pleasant well appearing older lady, fully alert, oriented and in no acute  distress, Daughter  HEENT: NCAT EOMI LUNGS:  CTAB CARDIAC:  Regular rate and rhythm, normal S1 and S2 without murmurs, rubs or gallops.   VASCULAR: radial pulses 2+  ABDOMEN:  Soft, with normal bowel sounds.  No organomegaly or tenderness found.   EXTREMITIES:  Full range of motion with no erythema, heat or effusion.  No cyanosis, clubbing or edema noted.  NEUROLOGIC:  The patient is alert and oriented.  Cranial nerves II-XII intact.  Motor and sensory examinations within normal limits.  Gait normal      A/P Assessment & Plan Hx of right sided Sciatic pain Chronic right-sided sciatic pain for 30-40 years, exacerbated by walking. Currently not severe but causes discomfort with increased activity. Initial management includes stretching exercises, physical therapy, and analgesics. If these measures are insufficient, Cymbalta (duloxetine) is a potential pharmacological option for managing this kind of pain. Currently doing ok, no recent  - Recommend stretching exercises and physical therapy. - Advise use of acetaminophen  and ibuprofen for pain management. - Discuss the use of Cymbalta (duloxetine) if pain becomes severe and unmanageable with current measures. - Advise use of heat or ice based on preference for symptomatic relief.  Insomnia Reports difficulty sleeping, sometimes getting less than three hours of sleep per night. Previously prescribed mirtazapine (Remeron) but discontinued due to issues. Mirtazapine is generally well-tolerated with minimal side effects reported. Restarting at a low dose with potential to increase if needed. - Restart mirtazapine at 15 mg at night, with the option to increase to 30 mg if needed. - Monitor response to mirtazapine over the next week and adjust dosage as necessary. - Discuss alternative treatments if mirtazapine is ineffective.  Vitamin D deficiency Vitamin D level was very low but has improved to 39 with current weekly supplementation. Target  level is above 60. Vitamin D is crucial for bone health and low level can cause fatigue. Plan to continue supplementation to achieve optimal levels. - Continue current vitamin D supplementation once weekly. - Plan to re-evaluate vitamin D  levels in six months. - Consider transitioning to a daily low-dose vitamin D supplement after three months if levels are adequate.  Vitamin B12 deficiency Vitamin B12 level was low at 145, now improved to 400 with supplementation. Normal range is 300 and above. Continued supplementation is necessary to maintain adequate levels and prevent fatigue. - Continue current vitamin B12 supplementation.    Orders Placed This Encounter  Procedures   Depression Screen -(PHQ- 2/9, BDI)     Diagnoses and all orders for this visit:  Primary insomnia -     Depression Screen -(PHQ- 2/9, BDI)  Depression screening (Z13.31)  Benign essential hypertension  Paroxysmal atrial fibrillation (CMS/HHS-HCC)  Other orders -     mirtazapine (REMERON) 15 MG tablet; Take 1 tablet (15 mg total) by mouth at bedtime for 60 days   # HTN  pAfib: BP in clinic elevated in 160s/90s. Currently on Imdur , Metop and hydralazine . She also follows with cardiology. Afib not treated with anticoagulation due to risk and patient declined. - Cont Amlodipine  5mg  daily - Continue Imdur , Metoprolol  and Hydralazine  as prescribed - Deferred ongoing management to Cardiology   # Anxiety  Insomnia: Complained of ongoing insomnia with intermittent anxiety. GABAergic agens have helped in the past. We discussed trying a medication that can help with both anxiety and also with sleep. - Restart Mirtazapine 15mg  nightly - Reassess in 2 weeks, increase dose to 30mg  nightly if needed    # Hypothyroidism: Her TSH has been intermittently mildly elevated for a few years.  The thyroid  hormone upsets her stomach, so she takes it with breakfast every day.  TSH was nl in 02/03/23 on 50 mcg LT4 daily. -  Continue Synthroid  50mcg - Follows with endocrine     Return in about 6 months (around 02/20/2024).   I personally spent 30 minutes face-to-face and non-face-to-face in the care of this patient, which includes all pre, intra, and post visit time on the date of service.  Doretta Urbano Button, MD

## 2023-09-26 DIAGNOSIS — H16212 Exposure keratoconjunctivitis, left eye: Secondary | ICD-10-CM | POA: Diagnosis not present

## 2023-09-27 DIAGNOSIS — G510 Bell's palsy: Secondary | ICD-10-CM | POA: Diagnosis not present

## 2023-12-04 DIAGNOSIS — M5481 Occipital neuralgia: Secondary | ICD-10-CM | POA: Diagnosis not present

## 2023-12-04 DIAGNOSIS — G510 Bell's palsy: Secondary | ICD-10-CM | POA: Diagnosis not present

## 2024-01-17 DIAGNOSIS — E782 Mixed hyperlipidemia: Secondary | ICD-10-CM | POA: Diagnosis not present

## 2024-01-17 DIAGNOSIS — I480 Paroxysmal atrial fibrillation: Secondary | ICD-10-CM | POA: Diagnosis not present

## 2024-01-17 DIAGNOSIS — I10 Essential (primary) hypertension: Secondary | ICD-10-CM | POA: Diagnosis not present

## 2024-01-17 DIAGNOSIS — R600 Localized edema: Secondary | ICD-10-CM | POA: Diagnosis not present

## 2024-01-17 DIAGNOSIS — Z23 Encounter for immunization: Secondary | ICD-10-CM | POA: Diagnosis not present

## 2024-01-17 DIAGNOSIS — R002 Palpitations: Secondary | ICD-10-CM | POA: Diagnosis not present

## 2024-01-17 NOTE — Progress Notes (Signed)
 Established Patient Visit   Chief Complaint: Chief Complaint  Patient presents with   Coronary Artery Disease   Hypertension    6 mo   Date of Service: 01/17/2024 Date of Birth: 1933/09/23 PCP: Salli Doretta Large, MD  History of Present Illness: Ms. Boulos is a 89 y.o.female patient returns for   1.  Status post PTCA mid LAD 05/06/2021  2.  Paroxysmal atrial fibrillation  3.  Essential hypertension  4.  Hyperlipidemia  5.  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 She took an aspirin  and tried to ignore the pain, but it persisted. Upon EMS arrival, the patient was reportedly in atrial fibrillation with ventricular rates in the 130s-140s. Initial ECG showed rapid atrial fibrillation with ST depression. En route to the hospital, she spontaneously converted to sinus rhythm. Repeat ECG showed normal sinus rhythm without acute ST-T wave abnormalities. The patient continued to have chest pain, was given sublingual nitroglycerin , but became hypotensive with systolic blood pressure in the 80s. She was given a fluid bolus. Admission labs notable for mildly elevated high-sensitivity troponin of 60-> 72->68, hypokalemia of 2.7, and normal renal function.  Chest x-ray revealed peribronchial thickening suggesting airway disease or bronchitis with no focal consolidation.  Chest CT showed stable 4 cm ascending thoracic aortic dilatation, extensive, atheromatous plaque in the thoracic and abdominal aorta, cirrhosis with signs of mild portal hypertension.  The patient had no recurrent chest pain, neck pain, palpitations, or shortness of breath. The patient reported a several year history of exertional neck and chest pain that resolves with rest, which has not worsened in severity or increased in  frequency recently. Potassium was replenished, anticoagulation was deferred as it was felt the atrial fibrillation was due to a reversible cause (hypokalemia). Lisinopril  was added due to hypertension, but was later discontinued for uncertain reason. 2D echocardiogram revealed normal left ventricular function with LVEF 60-65% with no wall motion abnormalities with trivial valvular abnormalities.   The patient presented recently for evaluation of chest pain and uncontrolled hypertension.  At a recent office visit on 04/28/2020, the patient's systolic blood pressure was mildly elevated and her amlodipine  was increased from 5 mg to 10 mg daily.  She tolerated 5 mg without side effects, but was unable to tolerate the higher dose due to difficulty swallowing.  This was discontinued, and she was started on HCTZ 12.5 mg daily.  She stated that since discontinuing amlodipine  altogether, her blood pressure had been poorly controlled and she felt generally unwell without specific complaints. She also reported postprandial exertional chest discomfort that radiated to her neck.  She denied chest pain with exertion on an empty stomach. The patient was advised to restart amlodipine  5 mg, but was unable to take it daily due to itching, so she has been taking it every 2 days or so.  7-day Holter monitor 01/02/2020 revealed predominant sinus rhythm with a mean heart rate of 61 bpm.  Sinus bradycardia was noted with a minimum heart rate of 47 bpm.  There were occasional premature atrial contractions observed and occasional atrial runs, the longest lasting 9 beats.  There were infrequent premature ventricular contractions and no diary entries. Lexiscan  Myoview 12/26/2017 revealed normal left ventricular function without evidence of scar or ischemia.  The patient was evaluated at [facility] ER on  07/27/2020 with complaints of dizziness and accelerated hypertension in the setting of hypokalemia with potassium 2.8.  She was advised to  discontinue hydrochlorothiazide  and restart valsartan .  She was unable to tolerate valsartan , and continued HCTZ.  She was also noted to be in paroxysmal atrial fibrillation at the time of admission. The patient has had infrequent episodes of palpitations with heart racing since first diagnosed with atrial fibrillation in the setting of hypokalemia and 12/2019. She did have a recent episode of palpitations that woke her from sleep that lasted about 2 hours with associated chest discomfort and radiation up into her neck.  She called EMS, and her rate and rhythm eventually improved.    14-day Holter monitor revealed predominant sinus rhythm with mean heart rate of 61 bpm.  Heart rate range 45 to 174 bpm.  Infrequent premature atrial contractions and premature ventricular contractions were present.  Occasional atrial runs the longest lasting 15 beats were observed.  There were 2 ventricular runs, the longest lasting 8 beats, likely representing brief atrial fibrillation with aberrancy. There were no diary entries.   Patient was seen at [facility] ER 11/19/2020 with atrial fibrillation with rapid ventricular rate, spontaneously converted to sinus bradycardia.  Borderline elevated troponin was felt to be secondary to demand supply ischemia.  Chest CT revealed moderate atherosclerotic calcifications involving thoracic and abdominal aorta and branch vessels, and three-vessel coronary artery calcifications.  The patient was hesitant to start chronic anticoagulation.  She returns today, reports occasional episodes of chest pressure and shortness of breath at night, occasional palpitations without prolonged heart racing, and chronic pedal edema.  The patient is active but does not do any structured exercise.  Lexiscan  Myoview was performed 12/16/2020 which revealed LVEF 58% without evidence for scar or ischemia.  Chest CT on 11/19/2020 showed no CT findings for aortic dissection or aneurysm, with moderate atherosclerotic  calcifications involving the thoracic and abdominal aorta and branch vessels, with stable three-vessel coronary artery calcifications, with stable advanced cirrhotic changes involving the liver.  The patient underwent cardiac catheterization on 05/06/2021 which revealed a calcified 80% stenosis mid LAD, 70% stenosis mid left circumflex, and underwent successful PTCA of mid LAD but was unable to deliver [place] balloon due to calcification.  Left ventriculography revealed normal left ventricular function with LVEF 55 to 65%.  The patient was started on clopidogrel  which the patient was unable to tolerate.  She was also started on isosorbide  mononitrate 30 mg daily.  The patient returns today, reports doing good.  She denies exertional chest pain or shortness of breath.  She reports occasional palpitations.  She has chronic pedal edema which is stable and unchanged.  The patient is active, does yard work  but does not exercise regularly.  The patient has paroxysmal atrial fibrillation, CHA2DS2-VASc score of 5, hesitant to start chronic anticoagulation.  The patient has essential hypertension, systolic blood sugar mildly elevated currently on hydralazine , diltiazem  as needed, metoprolol  succinate as needed, with history of multiple drug intolerances, unable to tolerate diltiazem  (difficulty swallowing), amlodipine , HCTZ, aspirin , lisinopril , losartan , valsartan , and metoprolol  succinate (itching and difficulty swallowing).  The patient follows a low-sodium, no added salt diet.   Past Medical and Surgical History  Past Medical History Past Medical History:  Diagnosis Date   Adenomatous polyps    Cancer (CMS/HHS-HCC)    Cataracts, bilateral    Chickenpox    Gastritis    GERD (gastroesophageal reflux disease)    Heart murmur    Hyperlipidemia    Hypertension  Peptic ulcer disease    Seasonal allergies     Past Surgical History She has a past surgical history that includes Mastectomy  Bilateral Modified Radical; Colonoscopy (02/24/2012); egd (02/24/2012); Tonsillectomy; Abdominal hysterectomy; cataract abstraction; Joint replacement (Left, 09/08/2011); Cholecystectomy; Colonoscopy; Upper gastrointestinal endoscopy; and TOTAL KNEE ARTHROPLASTY (Right) (Right, 05/30/2017).   Medications and Allergies  Current Medications  Current Outpatient Medications  Medication Sig Dispense Refill   ALPRAZolam  (XANAX ) 0.5 MG tablet Take 1 tablet by mouth twice daily as needed for anxiety 14 tablet 0   cyanocobalamin  (VITAMIN B12) 1000 MCG tablet Take 1 tablet (1,000 mcg total) by mouth once daily 90 tablet 3   ergocalciferol, vitamin D2, 1,250 mcg (50,000 unit) capsule Take 1 capsule (50,000 Units total) by mouth once a week for 360 doses 20 capsule 15   hydrALAZINE  (APRESOLINE ) 25 MG tablet TAKE 1 TABLET BY MOUTH THREE TIMES DAILY 270 tablet 0   isosorbide  mononitrate (IMDUR ) 60 MG ER tablet Take 1 tablet (60 mg total) by mouth once daily 90 tablet 0   levothyroxine  (SYNTHROID ) 50 MCG tablet Take 1 tablet (50 mcg total) by mouth every morning before breakfast (0630) 90 tablet 4   metoprolol  succinate (TOPROL -XL) 25 MG XL tablet TAKE 1 TABLET BY MOUTH ONCE DAILY AS NEEDED 90 tablet 2   omeprazole  (PRILOSEC) 20 MG DR capsule Take 1 capsule (20 mg total) by mouth once daily 90 capsule 1   potassium chloride  (KLOR-CON ) 10 MEQ ER tablet Take 2 tablets by mouth once daily 180 tablet 0   tobramycin-dexAMETHasone  (TOBRADEX) 0.3-0.1 % ophthalmic suspension Place 1 drop into the left eye 4 (four) times daily     mirtazapine (REMERON) 15 MG tablet Take 1 tablet (15 mg total) by mouth at bedtime for 60 days (Patient not taking: Reported on 12/04/2023) 30 tablet 1   No current facility-administered medications for this visit.    Allergies: Amlodipine , Valsartan , Adhesive tape-silicones, Aspirin , Hydrochlorothiazide , Hydrocodone , Oxycodone , and Penicillin g  Social and Family History  Social  History  reports that she quit smoking about 35 years ago. Her smoking use included cigarettes. She has never used smokeless tobacco. She reports that she does not drink alcohol and does not use drugs.  Family History Family History  Problem Relation Name Age of Onset   Coronary Artery Disease (Blocked arteries around heart) Mother     Alcohol abuse Mother     Sudden death (unexpected death due to unknown cause) Father     Aneurysm Father     Breast cancer Paternal Aunt     Cancer Brother     Tremor Maternal Grandmother     Myocardial Infarction (Heart attack) Maternal Grandfather      Review of Systems   Review of Systems: The patient reports chest pain, and exertional neck pain, with mild exertional shortness of breath, without orthopnea, paroxysmal nocturnal dyspnea, with mild bilateral pedal edema, with infrequent palpitations, heart racing, presyncope, syncope, with generalized fatigue and easy fatigability, with depression. Review of 8  Systems is negative except as described above.  Physical Examination   Vitals:BP (!) 140/82   Pulse 67   Ht 165.1 cm (5' 5)   Wt 85.3 kg (188 lb)   LMP  (LMP Unknown)   SpO2 95%   BMI 31.28 kg/m  Ht:165.1 cm (5' 5) Wt:85.3 kg (188 lb) ADJ:Anib surface area is 1.98 meters squared. Body mass index is 31.28 kg/m.  General: Alert and oriented. Well-appearing. No acute distress. HEENT: Pupils equally reactive to  light and accomodation    Neck: Supple, no JVD Lungs: Normal effort of breathing; clear to auscultation bilaterally; no wheezes, rales, rhonchi Heart: Regular rate and rhythm. No murmur, rub, or gallop Abdomen:  nondistended, with normal bowel sounds Extremities: no cyanosis, clubbing with trace bilateral ankle edema Peripheral Pulses: 2+ radial Skin: Warm, dry, no diaphoresis  Assessment   89 y.o. female with  1. Benign essential hypertension   2. Need for vaccination   3. Heart palpitations   4. Paroxysmal atrial  fibrillation (CMS/HHS-HCC)   5. Mixed hyperlipidemia   6. Pedal edema    89 year old female with a history of hypertension, hyperlipidemia, ascending aortic aneurysm, and admission to [facility] ER for new onset atrial fibrillation in the setting of hypokalemia.  2D echocardiogram revealed normal left ventricular function.  7-day Holter monitor revealed predominant sinus rhythm with occasional atrial, the longest lasting 9 beats, without evidence of significant atrial fibrillation.  The patient has a history of poorly controlled blood pressure and multiple medication intolerances.  The patient was evaluated at [facility] ER for nausea in the setting of hypokalemia and accelerated hypertension, and was noted to be in atrial fibrillation.  The patient had infrequent episodes of palpitations, with one episode that resolved spontaneously after 2 hours.  Chronic anticoagulation was not initiated as atrial fibrillation was felt to be due to a reversible cause (hypokalemia). 14-day Holter monitor revealed predominant sinus rhythm with occasional atrial runs, longest lasting 15 beats and 2 ventricular runs, longest lasting 8 beats, likely representing atrial fibrillation with aberrancy.  Patient was again seen on 11/19/2020 with atrial fibrillation with a rapid ventricular rate spontaneously converted to sinus bradycardia. The patient has had no recent episodes of palpitations since taking potassium supplement daily. She declines chronic anticoagulation. She has coronary artery disease per CT chest, has stable angina, and declines further cardiac diagnostics.  The patient underwent cardiac catheterization which revealed heavily calcified 80% stenosis mid LAD, 70% stenosis of mid left circumflex.  The patient underwent PTCA of mid LAD but was unable to deliver [place] balloon coronary stent due to calcification.  Patient returns today, reports doing well without exertional chest pain or shortness of breath.  She has essential hypertension,  blood pressure well-controlled, on hydralazine , with intolerance to multiple antihypertensive medications.  Plan   1.  Continue current medications 2.  Counseled patient about low-sodium diet 3.  DASH diet printed instructions given to the patient 4.  Counseled patient about low-cholesterol diet 5.  Low-fat and cholesterol diet printed instructions given to the patient 6.  Defer chronic anticoagulation per patient's wishes 7.  Instructed patient to keep blood pressure diary 8.  Return to clinic for follow-up in 6 months  No orders of the defined types were placed in this encounter.   Return in about 6 months (around 07/16/2024).  MARSA DOOMS, MD PhD [facility]

## 2024-01-29 DIAGNOSIS — Z1331 Encounter for screening for depression: Secondary | ICD-10-CM | POA: Diagnosis not present

## 2024-01-29 DIAGNOSIS — G510 Bell's palsy: Secondary | ICD-10-CM | POA: Diagnosis not present

## 2024-02-02 NOTE — Progress Notes (Addendum)
 History of present illness Kaitlyn Oliver is seen today for follow-up hypothyroidism.  I also see her daughter Kaitlyn Oliver: [id number]) for her thyroid .  She is an 89 y.o. female who was diagnosed with hypothyroidism in 2021.  She was having a lot of problems with fatigue, hair loss, weight gain and temperature fluctuations.  She was originally started on 25 mcg LT4 daily.   I last saw her in 11/24.  She has done well since then. In 2/25, she went to the ED with Afib.  She is currently on 50 mcg of LT4 daily.  The thyroid  hormone upsets her stomach, so she takes it with breakfast every day (usually eats applesauce).  She feels well overall.  She says her energy level is good.   She walks her dog for exercise.  She has no anterior neck pain/swelling.  She is concerned about her thyroid .    ROS:  No chest pain.  No shortness of breath.   Medical History: Past Medical History:  Diagnosis Date   Adenomatous polyps    Cancer (CMS/HHS-HCC)    Cataracts, bilateral    Chickenpox    Gastritis    GERD (gastroesophageal reflux disease)    Heart murmur    Hyperlipidemia    Hypertension    Peptic ulcer disease    Seasonal allergies     Surgical History: Past Surgical History:  Procedure Laterality Date   JOINT REPLACEMENT Left 09/08/2011   COLONOSCOPY  02/24/2012   06/22/2006, 03/22/2001   EGD  02/24/2012   10/15/2007, 06/22/2006, 03/22/2001   TOTAL KNEE ARTHROPLASTY (Right) Right 05/30/2017   Dr. Kathlynn   ABDOMINAL HYSTERECTOMY     cataract abstraction     CHOLECYSTECTOMY     COLONOSCOPY     MASTECTOMY BILATERAL MODIFIED RADICAL     TONSILLECTOMY     UPPER GASTROINTESTINAL ENDOSCOPY      Social History:  reports that she quit smoking about 35 years ago. Her smoking use included cigarettes. She has never used smokeless tobacco. She reports that she does not drink alcohol and does not use drugs.  She is widowed  Family History: family history includes Alcohol abuse in  her mother; Aneurysm in her father; Breast cancer in her paternal aunt; Cancer in her brother; Coronary Artery Disease (Blocked arteries around heart) in her mother; Myocardial Infarction (Heart attack) in her maternal grandfather; Sudden death (unexpected death due to unknown cause) in her father; Tremor in her maternal grandmother.  Medications: Current Outpatient Medications  Medication Sig Dispense Refill   ALPRAZolam  (XANAX ) 0.5 MG tablet Take 1 tablet by mouth twice daily as needed for anxiety 14 tablet 0   cyanocobalamin  (VITAMIN B12) 1000 MCG tablet Take 1 tablet (1,000 mcg total) by mouth once daily 90 tablet 3   ergocalciferol, vitamin D2, 1,250 mcg (50,000 unit) capsule Take 1 capsule (50,000 Units total) by mouth once a week for 360 doses 20 capsule 15   hydrALAZINE  (APRESOLINE ) 25 MG tablet TAKE 1 TABLET BY MOUTH THREE TIMES DAILY 270 tablet 0   isosorbide  mononitrate (IMDUR ) 60 MG ER tablet Take 1 tablet (60 mg total) by mouth once daily 90 tablet 0   levothyroxine  (SYNTHROID ) 50 MCG tablet Take 1 tablet (50 mcg total) by mouth every morning before breakfast (0630) 90 tablet 4   metoprolol  succinate (TOPROL -XL) 25 MG XL tablet TAKE 1 TABLET BY MOUTH ONCE DAILY AS NEEDED 90 tablet 2   mirtazapine (REMERON) 15 MG tablet Take 1 tablet (15 mg total)  by mouth at bedtime for 60 days 30 tablet 1   omeprazole  (PRILOSEC) 20 MG DR capsule Take 1 capsule (20 mg total) by mouth once daily 90 capsule 1   potassium chloride  (KLOR-CON ) 10 MEQ ER tablet Take 2 tablets by mouth once daily 180 tablet 0   predniSONE (DELTASONE) 50 MG tablet Take 1 tablet (50 mg total) by mouth once daily for 5 days 5 tablet 0   tobramycin-dexAMETHasone  (TOBRADEX) 0.3-0.1 % ophthalmic suspension Place 1 drop into the left eye 4 (four) times daily     No current facility-administered medications for this visit.    Allergies: Allergies  Allergen Reactions   Amlodipine  Other (See Comments)    Trouble  swallowing, pruritis   Valsartan  Dizziness and Headache   Adhesive Tape-Silicones Dermatitis    Can not use plastic tape. Paper tape ok    Aspirin  Hives    Hives, itching   Hydrochlorothiazide  Other (See Comments)    Hypokalemia leading to AFib   Hydrocodone  Itching and Nausea   Oxycodone  Rash    Whelps   Penicillin G Itching    Physical Exam: Vitals:   02/02/24 0924  BP: (!) 140/82  Pulse: 58  SpO2: 97%  Weight: 80.7 kg (178 lb)  Height: 165.1 cm (5' 5)    Body mass index is 29.62 kg/m. GENERAL: Pleasant, well-appearing female in no distress.  NECK: normal size thyroid , no nodules palpable.   Physical exam otherwise deferred due to coronavirus precautions.   Labs: 02/15/2017: TSH = 4.74 (0.45-4.5) 10/03/2017: TSH = 4.22 08/20/2018: TSH = 7.08 03/01/2019: TSH = 3.82  09/03/2019: TSH = 4.84 (0.45-4.5) 10/02/2019: TSH = 3.24 10/17/2019: TSH = 4.6 (0.45-4.5).  FTI = 1.6 (1.2-4.9) 12/30/2019: TSH = 4.22.   03/02/2020: TSH = 2.664.  07/28/2020:  TSH = 4.406. 12/03/2020: TSH = 6.22  05/31/2021: TSH = 2.287. 01/31/2022:  K/Cr/Ca= 3.8/0.9/10.1.  UDY=7.912.   02/03/2023:  TSH=1.8.  04/27/2023: K/Cr/Ca = 3/0.82/9.6.  Assessment/Plan: 1.  Hypothyroidism.  Her TSH has been intermittently mildly elevated for a few years.  The thyroid  hormone upsets her stomach, so she takes it with breakfast every day.  TSH was nl in 11/24 on 50 mcg LT4 daily.  I will recheck TSH today and make adjustments as necessary. I sent in years worth of refills.   2.  Afib.  She had a transient episode of Afib w/ chest pain in 10/21.  She spontaneously converted to normal rhythm.  TSH was normal at that time, so her thyroid  is not likely playing a role.  She went to the ER for Afib in 9/22.  Will still plan to keep her TSH on the hypo-end of normal to make sure not to predispose her to a reoccurrence.  She last saw cardiology in 11/25.  3.  She will return to clinic in 12 months.  Addendum:   02/02/2024 : TSH = 0.788. Refills sent. Nurse to call   This note is partially prepared by Earla Daria Messier, Scribe, in the presence of and acting as the scribe of Dr. Debby Breaker , MD.    [facility], MD

## 2024-02-19 NOTE — Progress Notes (Signed)
 " Medicare Annual Wellness Visit  Subjective:   Kaitlyn Oliver is a 89 y.o. Female who presents for an Annual Wellness Visit. Additional concerns addressed today include: HPI  History of Present Illness Kaitlyn Oliver is an 89 year old female with atrial fibrillation who presents for a follow-up visit. She is accompanied by her daughter, Margo.  She has a history of atrial fibrillation and takes potassium supplements once daily as prescribed. She occasionally loses a pill but continues to take them daily. She previously experienced severe symptoms that mimicked a heart attack, but these have since improved.  She has a history of vitamin D deficiency. Her levels were low previously, and she had a recheck six months later. She continues to take high-dose vitamin D once a week and plans to switch to a daily dose after completing her current prescription. She also takes vitamin B12 supplements.  She takes magnesium  gummies, which initially caused discomfort but eventually improved her leg strength and sleep quality. She tried metyrapone for sleep but found it caused grogginess the next day and has not increased the dose. She reports better sleep since taking magnesium  and notes improved leg endurance.  She continues to drive selectively and manages her daily activities independently. She has a supportive family, including a daughter, and reflects on her long life and the support of her late husband.     Current Medical Providers and Suppliers: [facility] Patient Care Team: Salli Doretta Large, MD as PCP - General (Internal Medicine) Future Appointments     Date/Time Provider Department Center Visit Type   06/03/2024 10:45 AM Evern Allyson Hacker, NP [facility] C RETURN VISIT   07/22/2024 10:45 AM Ammon, Marsa, MD [facility] C FOLLOW UP   08/28/2024 10:00 AM Toche, Doretta Large, MD [facility] OFFICE VISIT   01/31/2025 10:15  AM Cherilyn Debby Quivers, MD [facility] RETURN VISIT      Cardiology  Age-appropriate Screening Schedule: The list below includes current immunization status and future screening recommendations based on patient's age. Orders for these recommended tests are listed in the plan section. The patient has been provided with a written plan. Immunization History  Administered Date(s) Administered   Influenza, IM unspecified 12/13/2014, 12/08/2015, 12/12/2016   PNEUMOCOCCAL (PCV13) (BIRTH-26YR) VACCINE (PREVNAR 13) 04/06/2017   RZV(>=18YR -OR-19+YRS IF  IMMCOMP) VACCINE (SHINGRIX) 07/26/2017, 10/02/2017    Health Maintenance Topics with due status: Overdue     Topic Date Due   Creatinine Level Never done   Lipid Panel Never done   Diabetes Screening Never done   DXA Bone Density Scan Never done   Annual Physical/Well Child Check Never done   Annual Urine Albumin Creatinine Ratio Never done   Adult Tetanus (Td And Tdap) Never done   RSV Immunization Pregnant or 50+ Never done   Gastroscopy (EGD) 02/24/2015   Colonoscopy 02/23/2017   Pneumococcal Vaccine: 50+ 04/06/2018   COVID-19 Vaccine Never done   Health Maintenance Topics with due status: Due On     Topic Date Due   Medicare Subsequent AWV G0439 02/14/2024   Health Maintenance Topics with due status: Due Soon     Topic Date Due   Potassium Level 02/20/2024   Health Maintenance Topics with due status: Not Due     Topic Last Completion Date   Serum Calcium  02/20/2023   TSH Level 02/02/2024   Depression Screening 02/19/2024   Health Maintenance Topics with due status: Completed  Topic Last Completion Date   Shingrix 10/02/2017   Influenza Vaccine 12/18/2023   Health Maintenance Topics with due status: Aged Out     Topic Date Due   Hib Vaccines Aged Out   Hepatitis A Vaccines Aged Out   Meningococcal B Vaccine Aged Out   Meningococcal ACWY Vaccine Aged Out   HPV Vaccines Aged Out     Depression Screen-PHQ2/9 completed today  PHQ-2 Over the past 2 weeks, how often have you been bothered by any of the following problems? Little interest or pleasure in doing things: Not at all Feeling down, depressed, or hopeless: Not at all Patient Health Questionnaire-2 Score: 0 PHQ-2 Over the last 2 weeks, how often have you been bothered by any of the following problems? Little interest or pleasure in doing things: Not at all Feeling down, depressed, or hopeless: Not at all Patient Health Questionnaire-2 Score: 0  PHQ-9 (if PHQ >=3)    PHQ-2 Interpretation Values between 0-3 are considered not significant for depression  PHQ-9 Interpretation and Treatment Recommendations:  0-4= None  5-9= Mild / Treatment: Support, educate to call if worse; return in one month  10-14= Moderate / Treatment: Support, watchful waiting; Antidepressant or Psychotherapy  15-19= Moderately severe / Treatment: Antidepressant OR Psychotherapy  >= 20 = Major depression, severe / Antidepressant AND Psychotherapy  Patient Health Risk Assessment questionnaire (HRA <redacted file path>): (if patient completed in MyChart or added in flowsheet)    * No data to display          Functional Ability/Safety Screen: Was the patient's timed Get Up and Go Test unsteady or longer than 30 sec? No  How to perform Timed Up and Go test (TUG): [email or web address]    Cognitive Assessment: Cognitive screen used: Clock drawing. Results normal Results: The patient does not have any evidence of any cognitive problems and denies any change in mood/affect, appearance, speech, memory or motor skills.  Identification of Risk Factors: Risk factors include: cardiovascular risk and increased fall risk  Patient Active Problem List  Diagnosis   Fatigue   Mixed hyperlipidemia   Cardiac murmur   Peptic ulcer disease   Cataracts, bilateral   Seasonal allergies   Gastritis    Adenomatous polyps   Acute chest pain   Benign essential hypertension   S/P TKR (total knee replacement) using cement, right   Ascending aortic aneurysm ()   Coronary artery disease involving native coronary artery of native heart   Heart palpitations   Paroxysmal atrial fibrillation (CMS/HHS-HCC)   Hypokalemia   Pedal edema     Outpatient Medications Prior to Visit  Medication Sig Dispense Refill   ALPRAZolam  (XANAX ) 0.5 MG tablet Take 1 tablet by mouth twice daily as needed for anxiety 14 tablet 0   cyanocobalamin  (VITAMIN B12) 1000 MCG tablet Take 1 tablet (1,000 mcg total) by mouth once daily 90 tablet 3   ergocalciferol, vitamin D2, 1,250 mcg (50,000 unit) capsule Take 1 capsule (50,000 Units total) by mouth once a week for 360 doses 20 capsule 15   hydrALAZINE  (APRESOLINE ) 25 MG tablet TAKE 1 TABLET BY MOUTH THREE TIMES DAILY 270 tablet 0   levothyroxine  (SYNTHROID ) 50 MCG tablet Take 1 tablet (50 mcg total) by mouth every morning before breakfast (0630) 90 tablet 4   metoprolol  succinate (TOPROL -XL) 25 MG XL tablet TAKE 1 TABLET BY MOUTH ONCE DAILY AS NEEDED 90 tablet 2   omeprazole  (PRILOSEC) 20 MG DR capsule Take 1 capsule (20 mg total) by mouth once  daily 90 capsule 1   tobramycin-dexAMETHasone  (TOBRADEX) 0.3-0.1 % ophthalmic suspension Place 1 drop into the left eye 4 (four) times daily     isosorbide  mononitrate (IMDUR ) 60 MG ER tablet Take 1 tablet (60 mg total) by mouth once daily 90 tablet 0   mirtazapine (REMERON) 15 MG tablet Take 1 tablet (15 mg total) by mouth at bedtime for 60 days 30 tablet 1   potassium chloride  (KLOR-CON ) 10 MEQ ER tablet Take 2 tablets by mouth once daily 180 tablet 0   No facility-administered medications prior to visit.    Social History   Socioeconomic History   Marital status: Widowed  Tobacco Use   Smoking status: Former    Current packs/day: 0.00    Types: Cigarettes    Quit date: 10/08/1988    Years since  quitting: 35.3   Smokeless tobacco: Never  Vaping Use   Vaping status: Never Used  Substance and Sexual Activity   Alcohol use: No   Drug use: No   Sexual activity: Yes   Social Drivers of Corporate Investment Banker Strain: Low Risk  (02/19/2024)   Overall Financial Resource Strain (CARDIA)    Difficulty of Paying Living Expenses: Not hard at all  Food Insecurity: No Food Insecurity (02/19/2024)   Hunger Vital Sign    Worried About Running Out of Food in the Last Year: Never true    Ran Out of Food in the Last Year: Never true  Transportation Needs: No Transportation Needs (02/19/2024)   PRAPARE - Administrator, Civil Service (Medical): No    Lack of Transportation (Non-Medical): No  Physical Activity: Inactive (12/08/2021)   Received from [facility]   Exercise Vital Sign    On average, how many days per week do you engage in moderate to strenuous exercise (like a brisk walk)?: 0 days    On average, how many minutes do you engage in exercise at this level?: 0 min  Stress: No Stress Concern Present (12/08/2021)   Received from [facility] - Occupational Stress Questionnaire    Feeling of Stress : Not at all  Social Connections: Moderately Isolated (12/08/2021)   Received from [facility]   Social Connection and Isolation Panel    In a typical week, how many times do you talk on the phone with family, friends, or neighbors?: More than three times a week    How often do you get together with friends or relatives?: More than three times a week    How often do you attend church or religious services?: More than 4 times per year    Do you belong to any clubs or organizations such as church groups, unions, fraternal or athletic groups, or school groups?: No    How often do you attend meetings of the clubs or organizations you belong to?: Never    Are you married, widowed, divorced, separated, never married, or  living with a partner?: Widowed  Housing Stability: Low Risk  (02/19/2024)   Housing Stability Vital Sign    Unable to Pay for Housing in the Last Year: No    Number of Times Moved in the Last Year: 0    Homeless in the Last Year: No     Family History  Problem Relation Age of Onset   Coronary Artery Disease (Blocked arteries around heart) Mother    Alcohol abuse Mother    Sudden death (unexpected death due to unknown  cause) Father    Aneurysm Father    Breast cancer Paternal Aunt    Cancer Brother    Tremor Maternal Grandmother    Myocardial Infarction (Heart attack) Maternal Grandfather      Past Medical History:  Diagnosis Date   Adenomatous polyps    Cancer (CMS/HHS-HCC)    Cataracts, bilateral    Chickenpox    Gastritis    GERD (gastroesophageal reflux disease)    Heart murmur    Hyperlipidemia    Hypertension    Peptic ulcer disease    Seasonal allergies      Review of Systems  Objective:   Vitals:   02/19/24 0929  BP: 126/80  Pulse: 74  SpO2: 96%  Weight: 84.8 kg (187 lb)  Height: 165.1 cm (5' 5)  PainSc: 0-No pain   Body mass index is 31.12 kg/m. Home vitals:    Physical Exam  GENERAL:  Pleasant well appearing elderly lady, alert, oriented and in no acute distress. Here with her grand daughter HEENT:  NCAT EOMI  LUNGS breathing comfortably on RA CARDIAC:  Regular rate  NEUROLOGIC:  The patient is alert and oriented.  Moves all extremities equally   Physical Exam     Assessment/Plan:    Patient Self-Management and Personalized Health Advice The patient has been provided with information about: exercise and fall prevention  During the course of the visit the patient was educated and counseled about appropriate screening and preventive services including:  Influenza vaccine Fall Risk assessment done Fall Risk-recommendations made to reduce risk  The patient's BMI is in the acceptable range  Diagnoses and all  orders for this visit:  Benign essential hypertension  Paroxysmal atrial fibrillation (CMS/HHS-HCC)  Coronary artery disease of native artery of native heart with stable angina pectoris  Depression screening -     Depression Screen -(PHQ- 2/9, BDI)  Mixed hyperlipidemia  Other orders -     mirtazapine (REMERON) 30 MG tablet; Take 1 tablet (30 mg total) by mouth at bedtime for 360 days -     potassium chloride  (KLOR-CON ) 10 MEQ ER tablet; Take 2 tablets (20 mEq total) by mouth at bedtime for 180 days -     isosorbide  mononitrate (IMDUR ) 60 MG ER tablet; Take 1 tablet (60 mg total) by mouth once daily for 360 days    Fall Risk assessment done Fall Risk plan of care done The following recommendations were made and provided to the patient:   -Make sure there is plenty of light and use nightlights in bedroom, hall & bathroom   -Avoid using slippery throw rugs, tape loose carpet to the floor, or remove the throw rug   -Keep stairs and walkways clear of clutter and telephone or electrical cords   -Even at home, wear well-fitting sturdy shoes with a flat, thin sole and a low heel and cover the entire foot.  Avoid wearing slippers   -Drink plenty of water throughout the day - aim for 1/2 cup every 1/2 hr first 8 hrs of the day   -Take time when changing positions such as going from sitting to standing. Clench your fists or pump your ankles before moving   -Bring all of your medications to every appointment with every health care provider   -Avoid ice or slippery floors   -Avoid sedating over the counter medicines and alcohol   -Have your vision checked yearly   -Stay active and walk regularly   -Have grab bars added to your tub/shower   -  Use grab bars and a mat or non-slip strips in the tub and shower   -Have handrails added to stairs   -Have handrails on both sides of stairs  Assessment & Plan  HTN: BP well controlled - Cont Imdur  60mg  daily - Hydral 25mg  TID -   Afib: - Rate  controlled with metop xl 25 - Cont KCL 10MEQ daily  Insomnia: - Increase mirtazapine to 30mg  nightly  Hypothyroidism: TSH wnl last check - cont 50mcg daily  Vitamin D deficiency Managed with high-dose ergocalciferol to achieve optimal levels. Current regimen is effective, but levels remain low. Vitamin D is crucial for mood, fatigue, and bone health. - Continue ergocalciferol 1,250 mcg oral once a week. - Will recheck vitamin D levels at next visit.  General Health Maintenance Routine health maintenance discussed, including the importance of vitamin D supplementation for overall health. - Continue current vitamin D supplementation regimen. - Will recheck labs at next visit to monitor overall health status.    For split visits please select--Optional Coding for video or office visit. Time or MDM for new OR established pts (Optional)--will disappear if none selected: 03-29-2019 E&M) This visit was coded based on time. I spent a total of 45 minutes in both face-to-face and non-face-to-face activities for this visit on the date of this encounter. This time did not include the time spent on the wellness exam.  6 MONTHS FOLLOW UP  Future Appointments     Date/Time Provider Department Center Visit Type   06/03/2024 10:45 AM Evern Allyson Hacker, NP [facility] C RETURN VISIT   07/22/2024 10:45 AM Ammon, Marsa, MD [facility] C FOLLOW UP   08/28/2024 10:00 AM Toche, Doretta Large, MD [facility] OFFICE VISIT   01/31/2025 10:15 AM Cherilyn Debby Quivers, MD [facility] RETURN VISIT       An after visit summary was provided for the patient either in written format or through MyChart *Some images could not be shown."

## 2024-03-10 ENCOUNTER — Encounter: Payer: Self-pay | Admitting: Emergency Medicine

## 2024-03-10 ENCOUNTER — Emergency Department

## 2024-03-10 ENCOUNTER — Other Ambulatory Visit: Payer: Self-pay

## 2024-03-10 ENCOUNTER — Encounter
Admission: EM | Disposition: A | Discharge status: Other Acute Inpatient Hospital | Payer: Self-pay | Source: Home / Self Care | Attending: Hospitalist

## 2024-03-10 ENCOUNTER — Inpatient Hospital Stay
Admission: EM | Admit: 2024-03-10 | Discharge: 2024-03-13 | DRG: 250 | Disposition: A | Discharge status: Other Acute Inpatient Hospital | Attending: Pulmonary Disease | Admitting: Pulmonary Disease

## 2024-03-10 DIAGNOSIS — I2585 Chronic coronary microvascular dysfunction: Secondary | ICD-10-CM

## 2024-03-10 DIAGNOSIS — E785 Hyperlipidemia, unspecified: Secondary | ICD-10-CM | POA: Diagnosis present

## 2024-03-10 DIAGNOSIS — Z79899 Other long term (current) drug therapy: Secondary | ICD-10-CM

## 2024-03-10 DIAGNOSIS — R739 Hyperglycemia, unspecified: Secondary | ICD-10-CM | POA: Diagnosis present

## 2024-03-10 DIAGNOSIS — J9601 Acute respiratory failure with hypoxia: Secondary | ICD-10-CM | POA: Diagnosis present

## 2024-03-10 DIAGNOSIS — Z885 Allergy status to narcotic agent status: Secondary | ICD-10-CM

## 2024-03-10 DIAGNOSIS — I160 Hypertensive urgency: Secondary | ICD-10-CM | POA: Diagnosis not present

## 2024-03-10 DIAGNOSIS — Z853 Personal history of malignant neoplasm of breast: Secondary | ICD-10-CM

## 2024-03-10 DIAGNOSIS — E663 Overweight: Secondary | ICD-10-CM | POA: Diagnosis present

## 2024-03-10 DIAGNOSIS — E039 Hypothyroidism, unspecified: Secondary | ICD-10-CM | POA: Diagnosis present

## 2024-03-10 DIAGNOSIS — I161 Hypertensive emergency: Secondary | ICD-10-CM | POA: Diagnosis present

## 2024-03-10 DIAGNOSIS — I480 Paroxysmal atrial fibrillation: Secondary | ICD-10-CM | POA: Diagnosis not present

## 2024-03-10 DIAGNOSIS — F419 Anxiety disorder, unspecified: Secondary | ICD-10-CM | POA: Diagnosis not present

## 2024-03-10 DIAGNOSIS — I716 Thoracoabdominal aortic aneurysm, without rupture, unspecified: Secondary | ICD-10-CM | POA: Diagnosis not present

## 2024-03-10 DIAGNOSIS — J9602 Acute respiratory failure with hypercapnia: Secondary | ICD-10-CM | POA: Diagnosis present

## 2024-03-10 DIAGNOSIS — F32A Depression, unspecified: Secondary | ICD-10-CM | POA: Diagnosis not present

## 2024-03-10 DIAGNOSIS — K7460 Unspecified cirrhosis of liver: Secondary | ICD-10-CM | POA: Diagnosis present

## 2024-03-10 DIAGNOSIS — R911 Solitary pulmonary nodule: Secondary | ICD-10-CM | POA: Diagnosis present

## 2024-03-10 DIAGNOSIS — Z91048 Other nonmedicinal substance allergy status: Secondary | ICD-10-CM

## 2024-03-10 DIAGNOSIS — Z96653 Presence of artificial knee joint, bilateral: Secondary | ICD-10-CM | POA: Diagnosis present

## 2024-03-10 DIAGNOSIS — E872 Acidosis, unspecified: Secondary | ICD-10-CM | POA: Diagnosis present

## 2024-03-10 DIAGNOSIS — Z9013 Acquired absence of bilateral breasts and nipples: Secondary | ICD-10-CM

## 2024-03-10 DIAGNOSIS — R079 Chest pain, unspecified: Principal | ICD-10-CM | POA: Diagnosis present

## 2024-03-10 DIAGNOSIS — I462 Cardiac arrest due to underlying cardiac condition: Secondary | ICD-10-CM | POA: Diagnosis present

## 2024-03-10 DIAGNOSIS — D696 Thrombocytopenia, unspecified: Secondary | ICD-10-CM | POA: Diagnosis present

## 2024-03-10 DIAGNOSIS — I214 Non-ST elevation (NSTEMI) myocardial infarction: Secondary | ICD-10-CM | POA: Diagnosis present

## 2024-03-10 DIAGNOSIS — Z888 Allergy status to other drugs, medicaments and biological substances status: Secondary | ICD-10-CM

## 2024-03-10 DIAGNOSIS — Z87891 Personal history of nicotine dependence: Secondary | ICD-10-CM

## 2024-03-10 DIAGNOSIS — D649 Anemia, unspecified: Secondary | ICD-10-CM | POA: Diagnosis present

## 2024-03-10 DIAGNOSIS — I4901 Ventricular fibrillation: Secondary | ICD-10-CM | POA: Diagnosis present

## 2024-03-10 DIAGNOSIS — I2111 ST elevation (STEMI) myocardial infarction involving right coronary artery: Principal | ICD-10-CM | POA: Diagnosis present

## 2024-03-10 DIAGNOSIS — R570 Cardiogenic shock: Secondary | ICD-10-CM | POA: Diagnosis present

## 2024-03-10 DIAGNOSIS — Z6829 Body mass index (BMI) 29.0-29.9, adult: Secondary | ICD-10-CM

## 2024-03-10 DIAGNOSIS — Z7902 Long term (current) use of antithrombotics/antiplatelets: Secondary | ICD-10-CM

## 2024-03-10 DIAGNOSIS — K7581 Nonalcoholic steatohepatitis (NASH): Secondary | ICD-10-CM | POA: Diagnosis present

## 2024-03-10 DIAGNOSIS — E876 Hypokalemia: Secondary | ICD-10-CM | POA: Diagnosis present

## 2024-03-10 DIAGNOSIS — I169 Hypertensive crisis, unspecified: Secondary | ICD-10-CM

## 2024-03-10 DIAGNOSIS — I2510 Atherosclerotic heart disease of native coronary artery without angina pectoris: Secondary | ICD-10-CM | POA: Diagnosis present

## 2024-03-10 DIAGNOSIS — R918 Other nonspecific abnormal finding of lung field: Secondary | ICD-10-CM | POA: Diagnosis present

## 2024-03-10 DIAGNOSIS — Z886 Allergy status to analgesic agent status: Secondary | ICD-10-CM

## 2024-03-10 DIAGNOSIS — Z880 Allergy status to penicillin: Secondary | ICD-10-CM

## 2024-03-10 DIAGNOSIS — Z79890 Hormone replacement therapy (postmenopausal): Secondary | ICD-10-CM

## 2024-03-10 DIAGNOSIS — K219 Gastro-esophageal reflux disease without esophagitis: Secondary | ICD-10-CM | POA: Diagnosis present

## 2024-03-10 DIAGNOSIS — K279 Peptic ulcer, site unspecified, unspecified as acute or chronic, without hemorrhage or perforation: Secondary | ICD-10-CM | POA: Diagnosis present

## 2024-03-10 DIAGNOSIS — I10 Essential (primary) hypertension: Secondary | ICD-10-CM | POA: Diagnosis present

## 2024-03-10 LAB — PROTIME-INR
INR: 1.1 (ref 0.8–1.2)
Prothrombin Time: 14.6 s (ref 11.4–15.2)

## 2024-03-10 LAB — CBC
HCT: 39 % (ref 36.0–46.0)
Hemoglobin: 12.9 g/dL (ref 12.0–15.0)
MCH: 29.9 pg (ref 26.0–34.0)
MCHC: 33.1 g/dL (ref 30.0–36.0)
MCV: 90.5 fL (ref 80.0–100.0)
Platelets: 136 K/uL — ABNORMAL LOW (ref 150–400)
RBC: 4.31 MIL/uL (ref 3.87–5.11)
RDW: 15.1 % (ref 11.5–15.5)
WBC: 5.8 K/uL (ref 4.0–10.5)
nRBC: 0 % (ref 0.0–0.2)

## 2024-03-10 LAB — BASIC METABOLIC PANEL WITH GFR
Anion gap: 15 (ref 5–15)
BUN: 13 mg/dL (ref 8–23)
CO2: 21 mmol/L — ABNORMAL LOW (ref 22–32)
Calcium: 9.9 mg/dL (ref 8.9–10.3)
Chloride: 102 mmol/L (ref 98–111)
Creatinine, Ser: 0.86 mg/dL (ref 0.44–1.00)
GFR, Estimated: >60 mL/min
Glucose, Bld: 120 mg/dL — ABNORMAL HIGH (ref 70–99)
Potassium: 4 mmol/L (ref 3.5–5.1)
Sodium: 137 mmol/L (ref 135–145)

## 2024-03-10 LAB — TROPONIN T, HIGH SENSITIVITY
Troponin T High Sensitivity: 57 ng/L — ABNORMAL HIGH (ref 0–19)
Troponin T High Sensitivity: 171 ng/L (ref 0–19)

## 2024-03-10 LAB — APTT: aPTT: 30 s (ref 24–36)

## 2024-03-10 SURGERY — CORONARY/GRAFT ACUTE MI REVASCULARIZATION
Anesthesia: Moderate Sedation

## 2024-03-10 MED ORDER — NICARDIPINE HCL IN NACL 20-0.86 MG/200ML-% IV SOLN: 3.0000 mg/h | INTRAVENOUS | Status: DC

## 2024-03-10 MED ORDER — HEPARIN BOLUS VIA INFUSION
4000.0000 [IU] | Freq: Once | INTRAVENOUS | Status: AC
  Administered 2024-03-10: 4000 [IU] via INTRAVENOUS
  Filled 2024-03-10: qty 4000

## 2024-03-10 MED ORDER — HYDRALAZINE HCL 50 MG PO TABS
50.0000 mg | ORAL_TABLET | Freq: Three times a day (TID) | ORAL | Status: DC
  Administered 2024-03-10 – 2024-03-13 (×8): 50 mg via ORAL
  Filled 2024-03-10 (×5): qty 1

## 2024-03-10 MED ORDER — LABETALOL HCL 5 MG/ML IV SOLN
5.0000 mg | Freq: Once | INTRAVENOUS | Status: AC
  Administered 2024-03-10: 5 mg via INTRAVENOUS
  Filled 2024-03-10: qty 4

## 2024-03-10 MED ORDER — MORPHINE SULFATE (PF) 2 MG/ML IV SOLN: 2.0000 mg | INTRAVENOUS | Status: DC | PRN

## 2024-03-10 MED ORDER — ONDANSETRON HCL 4 MG/2ML IJ SOLN
4.0000 mg | Freq: Three times a day (TID) | INTRAMUSCULAR | Status: DC | PRN
  Administered 2024-03-12 – 2024-03-13 (×2): 4 mg via INTRAVENOUS

## 2024-03-10 MED ORDER — OXYCODONE HCL 5 MG PO TABS
5.0000 mg | ORAL_TABLET | Freq: Four times a day (QID) | ORAL | Status: DC | PRN
  Administered 2024-03-11 – 2024-03-13 (×4): 5 mg via ORAL
  Filled 2024-03-10 (×2): qty 1

## 2024-03-10 MED ORDER — HEPARIN SODIUM (PORCINE) 5000 UNIT/ML IJ SOLN: 4000.0000 [IU] | Freq: Once | INTRAMUSCULAR | Status: DC

## 2024-03-10 MED ORDER — AMLODIPINE BESYLATE 5 MG PO TABS
5.0000 mg | ORAL_TABLET | Freq: Once | ORAL | Status: AC
  Administered 2024-03-10: 5 mg via ORAL
  Filled 2024-03-10: qty 1

## 2024-03-10 MED ORDER — ATORVASTATIN CALCIUM 20 MG PO TABS
40.0000 mg | ORAL_TABLET | Freq: Every day | ORAL | Status: DC
  Administered 2024-03-11 – 2024-03-13 (×3): 40 mg via ORAL
  Filled 2024-03-10: qty 2

## 2024-03-10 MED ORDER — AMLODIPINE BESYLATE 10 MG PO TABS
10.0000 mg | ORAL_TABLET | Freq: Every day | ORAL | Status: DC
  Administered 2024-03-11 – 2024-03-13 (×3): 10 mg via ORAL

## 2024-03-10 MED ORDER — IOHEXOL 350 MG/ML SOLN
100.0000 mL | Freq: Once | INTRAVENOUS | Status: AC | PRN
  Administered 2024-03-10: 100 mL via INTRAVENOUS

## 2024-03-10 MED ORDER — ALPRAZOLAM 0.25 MG PO TABS: 0.2500 mg | ORAL_TABLET | Freq: Two times a day (BID) | ORAL | Status: DC | PRN

## 2024-03-10 MED ORDER — ENALAPRILAT 1.25 MG/ML IV SOLN: 0.6250 mg | INTRAVENOUS | Status: DC | PRN

## 2024-03-10 MED ORDER — SODIUM CHLORIDE 0.9 % IV SOLN
INTRAVENOUS | Status: AC
  Administered 2024-03-10: 10 mL/h via INTRAVENOUS

## 2024-03-10 MED ORDER — LEVOTHYROXINE SODIUM 50 MCG PO TABS
50.0000 ug | ORAL_TABLET | Freq: Every day | ORAL | Status: DC
  Administered 2024-03-11 – 2024-03-13 (×3): 50 ug via ORAL
  Filled 2024-03-10: qty 1

## 2024-03-10 MED ORDER — PANTOPRAZOLE SODIUM 40 MG PO TBEC
40.0000 mg | DELAYED_RELEASE_TABLET | Freq: Every day | ORAL | Status: DC
  Administered 2024-03-11 – 2024-03-12 (×2): 40 mg via ORAL
  Filled 2024-03-10: qty 1

## 2024-03-10 MED ORDER — VITAMIN B-12 1000 MCG PO TABS
1000.0000 ug | ORAL_TABLET | Freq: Every day | ORAL | Status: DC
  Administered 2024-03-11 – 2024-03-13 (×3): 1000 ug via ORAL
  Filled 2024-03-10: qty 2

## 2024-03-10 MED ORDER — HEPARIN (PORCINE) 25000 UT/250ML-% IV SOLN
1250.0000 [IU]/h | INTRAVENOUS | Status: DC
  Administered 2024-03-10: 850 [IU]/h via INTRAVENOUS
  Administered 2024-03-11: 1100 [IU]/h via INTRAVENOUS
  Administered 2024-03-12: 1250 [IU]/h via INTRAVENOUS
  Filled 2024-03-10 (×3): qty 250

## 2024-03-10 MED ORDER — ISOSORBIDE MONONITRATE ER 60 MG PO TB24: 60.0000 mg | ORAL_TABLET | Freq: Every day | ORAL | Status: DC

## 2024-03-10 MED ORDER — CLOPIDOGREL BISULFATE 75 MG PO TABS: 75.0000 mg | ORAL_TABLET | Freq: Every day | ORAL | Status: DC

## 2024-03-10 MED ORDER — MORPHINE SULFATE (PF) 4 MG/ML IV SOLN
4.0000 mg | Freq: Once | INTRAVENOUS | Status: AC
  Administered 2024-03-10: 4 mg via INTRAVENOUS
  Filled 2024-03-10: qty 1

## 2024-03-10 NOTE — Consult Note (Signed)
 "  CARDIOLOGY CONSULT NOTE               Patient ID: Kaitlyn Oliver MRN: [id number] DOB/AGE: [birth year] 89 y.o.  Admit date: 03/10/2024 Referring Physician Dr. Waylon Hatchet emergency room Primary Physician Dr. Doretta Button primary Primary Cardiologist Dr. Ammon Reason for Consultation canceled STEMI unstable angina possible non-STEMI  HPI: 89 year old known coronary disease previous PCI of mid LAD with LV calcified lesion unable to deploy stent paroxysmal atrial fibrillation not on anticoagulation patient's choice hypertension hyperlipidemia thoracoabdominal aortic aneurysm presented with chest pain back pain jaw pain which was relatively severe today lasted about 45 minutes greater than a 10 out of 10 finally called rescue and was brought to the emergency room by time I evaluated patient pain was just mild less than 4/10.  STEMI was called in the field because of abnormal EKG which looked like artifact patient was hemodynamically stable in the emergency room had some hypokalemia no shortness of breath minimal chest pain EKG was nonischemic blood pressure was severely elevated close to 200 systolic.  After evaluate the patient STEMI was canceled and patient was treated for angina and worked up for possible aortopathy with imaging.  Patient was being admitted for further evaluation and management  Review of systems complete and found to be negative unless listed above     Past Medical History:  Diagnosis Date   Anxiety    Arthritis    thumbs, hands   Atrial fibrillation (HCC)    Cancer (HCC) 1991   bilateral breast   Depression    GERD (gastroesophageal reflux disease)    Headache    occasional - pinched nerve in neck   Hypertension    Hypothyroidism    Thyroid  disease    Wears dentures    full upper, partial lower    Past Surgical History:  Procedure Laterality Date   BOWEL RESECTION  01/17/2018   Procedure: SMALL BOWEL RESECTION;  Surgeon: Jordis Laneta FALCON,  MD;  Location: [facility];  Service: General;;   BREAST SURGERY Bilateral 1991   mastectomy   CATARACT EXTRACTION Bilateral    CHOLECYSTECTOMY     COLONOSCOPY  2013   Dr Gaylyn   CORONARY STENT INTERVENTION N/A 05/06/2021   Procedure: CORONARY STENT INTERVENTION;  Surgeon: Ammon Blunt, MD;  Location: [facility];  Service: Cardiovascular;  Laterality: N/A;   HALLUX VALGUS AUSTIN Right 06/03/2015   Procedure: HALLUX VALGUS AUSTIN;  Surgeon: Eva Gay, DPM;  Location: [facility];  Service: Podiatry;  Laterality: Right;  WITH POPLITEAL   KNEE ARTHROSCOPY WITH MEDIAL MENISECTOMY Right 08/30/2016   Procedure: KNEE ARTHROSCOPY WITH PARTIAL MEDIAL MENISECTOMY and Partial Lateral Menisectomy;  Surgeon: Kathlynn Sharper, MD;  Location: [facility];  Service: Orthopedics;  Laterality: Right;   KNEE ARTHROSCOPY WITH SUBCHONDROPLASTY Right 08/30/2016   Procedure: KNEE ARTHROSCOPY WITH SUBCHONDROPLASTY;  Surgeon: Kathlynn Sharper, MD;  Location: [facility];  Service: Orthopedics;  Laterality: Right;   LAPAROTOMY N/A 01/17/2018   Procedure: EXPLORATORY LAPAROTOMY;  Surgeon: Jordis Laneta FALCON, MD;  Location: [facility];  Service: General;  Laterality: N/A;   LEFT HEART CATH AND CORONARY ANGIOGRAPHY Left 05/06/2021   Procedure: LEFT HEART CATH AND CORONARY ANGIOGRAPHY;  Surgeon: Ammon Blunt, MD;  Location: [facility];  Service: Cardiovascular;  Laterality: Left;   MASTECTOMY Bilateral    ROTATOR CUFF REPAIR Bilateral    TONSILLECTOMY     TOTAL KNEE ARTHROPLASTY Left    TOTAL KNEE ARTHROPLASTY Right 05/30/2017  Procedure: TOTAL KNEE ARTHROPLASTY;  Surgeon: Kathlynn Sharper, MD;  Location: [facility];  Service: Orthopedics;  Laterality: Right;   VAGINAL HYSTERECTOMY      (Not in a hospital admission)  Social History   Socioeconomic History   Marital status: Widowed    Spouse name: Not on file   Number of children: 1   Years of education: Not on file   Highest education level:  12th grade  Occupational History   Occupation: Retired  Tobacco Use   Smoking status: Former    Current packs/day: 0.00    Average packs/day: 0.3 packs/day for 10.0 years (2.5 ttl pk-yrs)    Types: Cigarettes    Start date: 2    Quit date: 1991    Years since quitting: 35.0    Passive exposure: Past   Smokeless tobacco: Never   Tobacco comments:    smoking cessation materials not required  Vaping Use   Vaping status: Never Used  Substance and Sexual Activity   Alcohol use: No    Alcohol/week: 0.0 standard drinks of alcohol   Drug use: No   Sexual activity: Not Currently  Other Topics Concern   Not on file  Social History Narrative   Not on file   Social Drivers of Health   Tobacco Use: Medium Risk (03/10/2024)   Patient History    Smoking Tobacco Use: Former    Smokeless Tobacco Use: Never    Passive Exposure: Past  Physicist, Medical Strain: Low Risk  (02/19/2024)   Received from [facility]   Overall Financial Resource Strain (CARDIA)    Difficulty of Paying Living Expenses: Not hard at all  Food Insecurity: No Food Insecurity (02/19/2024)   [facility]   Epic    Within the past 12 months, you worried that your food would run out before you got the money to buy more.: Never true    Within the past 12 months, the food you bought just didn't last and you didn't have money to get more.: Never true  Transportation Needs: No Transportation Needs (02/19/2024)   Received from [facility] - Transportation    In the past 12 months, has lack of transportation kept you from medical appointments or from getting medications?: No    Lack of Transportation (Non-Medical): No  Physical Activity: Inactive (12/08/2021)   Exercise Vital Sign    Days of Exercise per Week: 0 days    Minutes of Exercise per Session: 0 min  Stress: No Stress Concern Present (12/08/2021)   [employer] of Occupational  Health - Occupational Stress Questionnaire    Feeling of Stress : Not at all  Social Connections: Moderately Isolated (12/08/2021)   Social Connection and Isolation Panel    Frequency of Communication with Friends and Family: More than three times a week    Frequency of Social Gatherings with Friends and Family: More than three times a week    Attends Religious Services: More than 4 times per year    Active Member of [employer] or Organizations: No    Attends Banker Meetings: Never    Marital Status: Widowed  Intimate Partner Violence: Not At Risk (12/08/2021)   Humiliation, Afraid, Rape, and Kick questionnaire    Fear of Current or Ex-Partner: No    Emotionally Abused: No    Physically Abused: No    Sexually Abused: No  Depression (PHQ2-9): Low Risk (12/01/2022)   Depression (PHQ2-9)  PHQ-2 Score: 0  Alcohol Screen: Low Risk (12/08/2021)   Alcohol Screen    Last Alcohol Screening Score (AUDIT): 0  Housing: Low Risk  (02/19/2024)   Received from [facility]   Epic    In the last 12 months, was there a time when you were not able to pay the mortgage or rent on time?: No    In the past 12 months, how many times have you moved where you were living?: 0    At any time in the past 12 months, were you homeless or living in a shelter (including now)?: No  Utilities: Not At Risk (02/19/2024)   Received from [facility]   Epic    In the past 12 months has the electric, gas, oil, or water company threatened to shut off services in your home?: No  Health Literacy: Not on file    Family History  Problem Relation Age of Onset   Pulmonary embolism Mother    Healthy Father       Review of systems complete and found to be negative unless listed above      PHYSICAL EXAM  General: Well developed, well nourished, in no acute distress HEENT:  Normocephalic and atramatic Neck:  No JVD.  Lungs: Clear bilaterally to auscultation and  percussion. Heart: HRRR . Normal S1 and S2 without gallops or murmurs.  Abdomen: Bowel sounds are positive, abdomen soft and non-tender  Msk:  Back normal, normal gait. Normal strength and tone for age. Extremities: No clubbing, cyanosis or edema.   Neuro: Alert and oriented X 3. Psych:  Good affect, responds appropriately  Labs:   Lab Results  Component Value Date   WBC 5.8 03/10/2024   HGB 12.9 03/10/2024   HCT 39.0 03/10/2024   MCV 90.5 03/10/2024   PLT 136 (L) 03/10/2024    Recent Labs  Lab 03/10/24 1636  NA 137  K 4.0  CL 102  CO2 21*  BUN 13  CREATININE 0.86  CALCIUM  9.9  GLUCOSE 120*   Lab Results  Component Value Date   TROPONINI <0.03 11/29/2017    Lab Results  Component Value Date   CHOL 227 (H) 02/15/2017   CHOL 253 (H) 09/11/2014   Lab Results  Component Value Date   HDL 33 (L) 02/15/2017   HDL 38 (L) 09/11/2014   Lab Results  Component Value Date   LDLCALC Comment 02/15/2017   LDLCALC 158 (H) 09/11/2014   Lab Results  Component Value Date   TRIG 534 (H) 02/15/2017   TRIG 284 (H) 09/11/2014   Lab Results  Component Value Date   CHOLHDL 6.9 (H) 02/15/2017   CHOLHDL 6.7 (H) 09/11/2014   No results found for: LDLDIRECT    Radiology: [facility] Chest Port 1 View Result Date: 03/10/2024 CLINICAL DATA:  Chest pain. EXAM: PORTABLE CHEST 1 VIEW COMPARISON:  04/27/2023 FINDINGS: The cardiomediastinal contours are stable allowing for differences in positioning. Aortic atherosclerosis. The lungs are clear. Pulmonary vasculature is normal. No consolidation, pleural effusion, or pneumothorax. No acute osseous abnormalities are seen. Overlying monitoring leads. IMPRESSION: No active disease. Electronically Signed   By: Andrea Gasman M.D.   On: 03/10/2024 17:32    EKG: Normal sinus rhythm diffuse nonspecific ST-T wave changes rate of 80  ASSESSMENT AND PLAN:  Canceled STEMI Angina Possible non-STEMI Coronary artery disease History of PCI to  LAD Hypertension Hyperlipidemia Thoracoabdominal aortic aneurysm GERD Paroxysmal atrial fibrillation . Agree with admit to telemetry heparin   if no contraindications follow-up troponins and EKGs Cancel STEMI EKG did not meet STEMI criteria and patient's symptoms were improving Echocardiogram for assessment left ventricular function Consider anticoagulation for significant CHA2DS2-VASc score paroxysmal atrial fibrillation in the past patient reportedly has refused anticoagulation Complex disease in mid LAD heavily calcified consider arthrectomy referral if symptoms of angina persist Statin therapy high intensity for known arteriosclerotic vascular disease and hyperlipidemia PPI therapy for reflux type symptoms Continue Imdur  for chronic recurrent anginal symptoms Consider adding Ranexa  Defer decision about cardiac catheterization to primary cardiologist Dr. Ammon Patient does not meet STEMI criteria so will not proceed with emergent cardiac cath at this point Signed: Cara JONETTA Lovelace MD, 03/10/2024, 6:04 PM      "

## 2024-03-10 NOTE — ED Triage Notes (Addendum)
 First nurse note: Pt to ED via [facility] from home. Pt reports intermittent chest and neck discomfort that started on 12/18 after getting teeth pulled. Pt reports pain is worse after eating and with movement. Pt also reports pain worsened last pm and today. EKG reading STEMI and taken to EDP Siadecki. CODE STEMI activated and pt taken to room 11.   204/120 324mg  ASA  20g LAC

## 2024-03-10 NOTE — ED Provider Notes (Signed)
 "  [facility] Provider Note    None    (approximate)   History   Chest Pain   HPI  Kaitlyn Oliver is a 89 y.o. female with a history of hypertension, atrial fibrillation not on anticoagulation and thoracic aortic aneurysm who is with chest pain, acute onset around 2 PM, severe intensity, and radiating to her neck and to her back.  She denies associated shortness of breath.  She has no nausea or vomiting.  She denies feeling dizzy or lightheaded.  She states that she has had some chest pain like this intermittently over the last 2 weeks.  It is worse after she eats.  However, the pain today is more intense.  I reviewed the past medical records.  The patient's most recent outpatient counter was with internal medicine on 12/8 for Medicare annual wellness visit.  She was most recently seen in the ED in February with chest pain and had a negative workup at that time.  Physical Exam   Triage Vital Signs: ED Triage Vitals  Encounter Vitals Group     BP      Girls Systolic BP Percentile      Girls Diastolic BP Percentile      Boys Systolic BP Percentile      Boys Diastolic BP Percentile      Pulse      Resp      Temp      Temp src      SpO2      Weight      Height      Head Circumference      Peak Flow      Pain Score      Pain Loc      Pain Education      Exclude from Growth Chart     Most recent vital signs: Vitals:   03/10/24 1800 03/10/24 1830  BP: (!) 187/99 (!) 187/92  Pulse: 61 (!) 58  Resp: 18 16  Temp:    SpO2: 91% 94%     General: Alert, uncomfortable appearing, no distress.  CV:  Good peripheral perfusion.  Normal heart sounds. Resp:  Normal effort.  Lungs CTAB. Abd:  No distention.  Other:  No peripheral edema.   ED Results / Procedures / Treatments   Labs (all labs ordered are listed, but only abnormal results are displayed) Labs Reviewed  BASIC METABOLIC PANEL WITH GFR - Abnormal; Notable for the following  components:      Result Value   CO2 21 (*)    Glucose, Bld 120 (*)    All other components within normal limits  CBC - Abnormal; Notable for the following components:   Platelets 136 (*)    All other components within normal limits  TROPONIN T, HIGH SENSITIVITY - Abnormal; Notable for the following components:   Troponin T High Sensitivity 57 (*)    All other components within normal limits  APTT  PROTIME-INR  HEPARIN  LEVEL (UNFRACTIONATED)  HEMOGLOBIN A1C  LIPID PANEL  TROPONIN T, HIGH SENSITIVITY     EKG  ED ECG REPORT I, Waylon Cassis, the attending physician, personally viewed and interpreted this ECG.  Date: 03/10/2024 EKG Time: 1617 Rate: 69 Rhythm: normal sinus rhythm QRS Axis: normal Intervals: normal ST/T Wave abnormalities: ST elevation in V2, no significant reciprocal changes Narrative Interpretation: Concerning for possible STEMI   ED ECG REPORT I, Waylon Cassis, the attending physician, personally viewed and interpreted this ECG.  Date:  03/10/2024 EKG Time: 1628 Rate: 78 Rhythm: normal sinus rhythm QRS Axis: normal Intervals: normal ST/T Wave abnormalities: LVH, otherwise normal Narrative Interpretation: no evidence of acute ischemia     RADIOLOGY  Chest x-ray: I independently viewed and interpreted the images; there is no focal consolidation or edema  CT angio chest/abdomen/pelvis:  IMPRESSION:  1. Ascending thoracic aortic aneurysm measuring 4.5 x 4.4 cm, increased in size  compared to the prior study, without thoracic aortic dissection. Recommend  semi-annual imaging follow-up by CTA or MRA and referral to cardiothoracic  surgery if not already obtained. This recommendation follows 2010  accf/aha/aats/ACR/asa/sca/scai/sir/sts/svm guidelines (circulation  2010;121:E266-e369). Aortic aneurysm nos ([id number]).  2. New scattered pulmonary nodules within the bilateral lower lobes and upper  lobes measuring up to 4 mm, without  focal lung consolidation, pleural effusion,  or pneumothorax.  3. Diffuse nodular liver contour compatible with cirrhosis.  4. Mild stenosis at the origin of the celiac axis.  5. Mild stranding surrounding the proximal aspect of the inferior mesenteric  artery. Findings may be inflammatory. No evidence for occlusion or dissection.    PROCEDURES:  Critical Care performed: Yes, see critical care procedure note(s)  .Critical Care  Performed by: Jacolyn Pae, MD Authorized by: Jacolyn Pae, MD   Critical care provider statement:    Critical care time (minutes):  30   Critical care time was exclusive of:  Separately billable procedures and treating other patients   Critical care was necessary to treat or prevent imminent or life-threatening deterioration of the following conditions:  Cardiac failure   Critical care was time spent personally by me on the following activities:  Development of treatment plan with patient or surrogate, discussions with consultants, evaluation of patient's response to treatment, examination of patient, ordering and review of laboratory studies, ordering and review of radiographic studies, ordering and performing treatments and interventions, pulse oximetry, re-evaluation of patient's condition, review of old charts and obtaining history from patient or surrogate   Care discussed with: admitting provider      MEDICATIONS ORDERED IN ED: Medications  0.9 %  sodium chloride  infusion (10 mL/hr Intravenous New Bag/Given 03/10/24 1947)  heparin  ADULT infusion 100 units/mL (25000 units/250mL) (850 Units/hr Intravenous New Bag/Given 03/10/24 1951)  morphine  (PF) 2 MG/ML injection 2 mg (has no administration in time range)  oxyCODONE  (Oxy IR/ROXICODONE ) immediate release tablet 5 mg (has no administration in time range)  nicardipine  (CARDENE ) 20mg  in 0.86% saline IV infusion (0.1 mg/ml) (has no administration in time range)  ondansetron  (ZOFRAN )  injection 4 mg (has no administration in time range)  atorvastatin  (LIPITOR) tablet 40 mg (has no administration in time range)  morphine  (PF) 4 MG/ML injection 4 mg (4 mg Intravenous Given 03/10/24 1711)  labetalol  (NORMODYNE ) injection 5 mg (5 mg Intravenous Given 03/10/24 1712)  iohexol  (OMNIPAQUE ) 350 MG/ML injection 100 mL (100 mLs Intravenous Contrast Given 03/10/24 1744)  heparin  bolus via infusion 4,000 Units (4,000 Units Intravenous Bolus from Bag 03/10/24 1940)  labetalol  (NORMODYNE ) injection 5 mg (5 mg Intravenous Given 03/10/24 1948)     IMPRESSION / MDM / ASSESSMENT AND PLAN / ED COURSE  I reviewed the triage vital signs and the nursing notes.  89 year old female with PMH as noted above presents with intermittent chest pain over the last 2 weeks, now with an acute episode of chest pain since 2 PM radiating to her neck and back.  On exam the patient is significantly hypertensive.  She appears uncomfortable.  Physical exam is otherwise  unremarkable for acute findings.  Differential diagnosis includes, but is not limited to, ACS, musculoskeletal pain, GERD, aortic dissection or ruptured aneurysm, less likely PE.  Patient's presentation is most consistent with acute presentation with potential threat to life or bodily function.  The patient is on the cardiac monitor to evaluate for evidence of arrhythmia and/or significant heart rate changes.  I was shown the EKG by the triage RN.  Although the initial EKG appeared somewhat atypical for STEMI, with ST elevation really only in V2, given that the patient was having active severe chest pain I activated code STEMI.  The patient was placed on the monitor.  She already received aspirin  from EMS.  I ordered analgesia, as well as labetalol  for blood pressure control.  The repeat EKG showed no ST elevations.  I canceled code STEMI.  I still consulted and discussed the case with Dr. Florencio from cardiology.  He agreed that the presentation was  not consistent with STEMI although he was concern for NSTEMI.  He did not feel that the patient needed emergent catheterization.  He agreed with blood pressure control, cardiac workup, and to hold off on heparin  until we rule out AAA rupture.  We will obtain basic labs, cardiac enzymes, chest x-ray, CTA of the chest, and reassess.  ----------------------------------------- 8:00 PM on 03/10/2024 -----------------------------------------  BMP and CBC are unremarkable.  Initial troponin was 57.  Repeat is pending.  The patient reports that her pain is significantly improved.  The blood pressure went down to the 180s after labetalol .  CTA is negative for evidence of dissection.  Therefore I ordered the heparin  infusion as per cardiology recommendations, although my overall suspicion is that this is likely either GERD or other noncardiac etiology, or mild ischemia related to the patient's hypertension.  The patient will need admission for further management.  I consulted Dr. Hilma from the hospitalist service; based on our discussion he agrees to evaluate the patient for admission.  FINAL CLINICAL IMPRESSION(S) / ED DIAGNOSES   Final diagnoses:  Chest pain, unspecified type  Hypertensive crisis     Rx / DC Orders   ED Discharge Orders     None        Note:  This document was prepared using Dragon voice recognition software and may include unintentional dictation errors.    Jacolyn Pae, MD 03/10/24 2002  "

## 2024-03-10 NOTE — ED Notes (Addendum)
 Cancel CODE STEMI per EDP Siadecki

## 2024-03-10 NOTE — ED Notes (Signed)
 Pt cleaned of incontinence. Peri-care provided. Bed linens changed.

## 2024-03-10 NOTE — Consult Note (Signed)
 PHARMACY - ANTICOAGULATION CONSULT NOTE  Pharmacy Consult for Heparin  Indication: chest pain/ACS  Allergies[1]  Patient Measurements: Height: 5' 5 (165.1 cm) Weight: 81.6 kg (180 lb) IBW/kg (Calculated) : 57 HEPARIN  DW (KG): 74.4  Vital Signs: Temp: 97.9 F (36.6 C) (12/28 1629) Temp Source: Oral (12/28 1629) BP: 187/92 (12/28 1830) Pulse Rate: 58 (12/28 1830)  Labs: Recent Labs    03/10/24 1636  HGB 12.9  HCT 39.0  PLT 136*  CREATININE 0.86    Estimated Creatinine Clearance: 47.7 mL/min (by C-G formula based on SCr of 0.86 mg/dL).   Medical History: Past Medical History:  Diagnosis Date   Anxiety    Arthritis    thumbs, hands   Atrial fibrillation (HCC)    Cancer (HCC) 1991   bilateral breast   Depression    GERD (gastroesophageal reflux disease)    Headache    occasional - pinched nerve in neck   Hypertension    Hypothyroidism    Thyroid  disease    Wears dentures    full upper, partial lower    Medications:  No history of chronic anticoagulant use PTA  Assessment: 89 y.o. female with a history of hypertension, atrial fibrillation not on anticoagulation and thoracic aortic aneurysm who is with chest pain, acute onset around 2 PM, severe intensity, and radiating to her neck and to her back. Troponin level of 57. Pharmacy has been consulted to initiate and dose continuous heparin  infusion. Baseline labs have been ordered and are pending.  Goal of Therapy:  Heparin  level 0.3-0.7 units/ml Monitor platelets by anticoagulation protocol: Yes   Plan:  Give 4000 units bolus x 1 Start heparin  infusion at 850 units/hr Check anti-Xa level in 8 hours and daily while on heparin  Continue to monitor H&H and platelets  Kaitlyn Oliver A Kaitlyn Oliver 03/10/2024,7:28 PM      [1]  Allergies Allergen Reactions   Nitroglycerin  Other (See Comments)    Bottoms out BP to 60's    Nitroglycerin  In D5w Other (See Comments)    Bottoms out BP    Valsartan  Other (See Comments)     Dizziness   Adhesive [Tape] Dermatitis    Can not use plastic tape. Paper tape ok    Amlodipine  Swelling    Throat swelling   Asa [Aspirin ] Itching   Hydralazine  Other (See Comments)    Oliguria (low urine output)   Hydrochlorothiazide  Other (See Comments)    Hypokalemia leading to AFib   Penicillin G Itching    Has patient had a PCN reaction causing immediate rash, facial/tongue/throat swelling, SOB or lightheadedness with hypotension: No Has patient had a PCN reaction causing severe rash involving mucus membranes or skin necrosis: No Has patient had a PCN reaction that required hospitalization: No Has patient had a PCN reaction occurring within the last 10 years: No If all of the above answers are NO, then may proceed with Cephalosporin use.    Hydrocodone -Acetaminophen  Itching and Rash

## 2024-03-10 NOTE — ED Notes (Signed)
 Stemi cancelled per Dr. Jacolyn

## 2024-03-10 NOTE — H&P (Signed)
 " History and Physical    Kaitlyn Oliver FMW:[id number] DOB: October 02, 1933 DOA: 03/10/2024  Referring MD/NP/PA:   PCP: Salli Amato, MD   Patient coming from:  The patient is coming from home.     Chief Complaint: chest pain  HPI: Kaitlyn Oliver is a 89 y.o. 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   Patient states that she has intermittent chest pain for more than 10 days which has worsened today.  The chest pain is located substernal area, constant, initially 10 out of 10 in severity, currently subsided, radiating to the neck.  No cough and SOB.  No fever or chills.  No nausea, vomiting, diarrhea or abdominal pain.  No symptoms UTI.  Patient denies any rectal bleeding or dark stool.  No recent fall or head injury.  Patient was found to have elevated blood pressure 214/114 which improved to 169/93 after giving labetalol  5 mg twice in ED.  Her chest pain has also subsided. EKG showed ST elevation in V2. Code STEMI was consulted.  Dr. Florencio evaluated the patient, who does not think pt meets criteria for STEMI.  Troponin 57.  IV heparin  started in ED.  Data reviewed independently and ED Course: pt was found to have trop  57, WBC 5.8, GFR> 60, temperature normal, heart rate 58, RR 20, oxygen  saturation 91-94% on room air.  Chest x-ray negative.  Patient is placed in PCU for observation.  CTA of chest/abdomen/pelvis:  1. Ascending thoracic aortic aneurysm measuring 4.5 x 4.4 cm, increased in size compared to the prior study, without thoracic aortic dissection. Recommend semi-annual imaging follow-up by CTA or MRA and referral to cardiothoracic surgery if not already obtained. This recommendation follows  2010 accf/aha/aats/ACR/asa/sca/scai/sir/sts/svm guidelines (circulation 2010;121:E266-e369). Aortic aneurysm nos ([id number]). 2. New scattered pulmonary nodules within the bilateral lower lobes and upper lobes measuring up to 4 mm, without focal lung consolidation, pleural effusion, or pneumothorax. 3. Diffuse nodular liver contour compatible with cirrhosis. 4. Mild stenosis at the origin of the celiac axis. 5. Mild stranding surrounding the proximal aspect of the inferior mesenteric artery. Findings may be inflammatory. No evidence for occlusion or dissection.     EKG: I have personally reviewed.  Sinus rhythm, QTc 454, borderline left axis deviation, ST elevation only V2.  The repeated EKG showed no ST elevation, but showed PAC.   Review of Systems:   General: no fevers, chills, no body weight gain, has fatigue HEENT: no blurry vision, hearing changes or sore throat Respiratory: no dyspnea, coughing, wheezing CV: has chest pain, no palpitations GI: no nausea, vomiting, abdominal pain, diarrhea, constipation GU: no dysuria, burning on urination, increased urinary frequency, hematuria  Ext: no leg edema Neuro: no unilateral weakness, numbness, or tingling, no vision change or hearing loss Skin: no rash, no skin tear. MSK: No muscle spasm, no deformity, no limitation of range of movement in spin Heme: No easy bruising.  Travel history: No recent long distant travel.   Allergy: Allergies[1]  Past Medical History:  Diagnosis Date   Anxiety    Arthritis    thumbs, hands   Atrial fibrillation (HCC)    Cancer (HCC) 1991   bilateral breast   Depression    GERD (gastroesophageal reflux disease)    Headache    occasional -  pinched nerve in neck   Hypertension    Hypothyroidism    Thyroid  disease    Wears dentures    full upper, partial lower    Past Surgical History:  Procedure Laterality Date   BOWEL RESECTION  01/17/2018   Procedure: SMALL BOWEL RESECTION;  Surgeon:  Jordis Laneta FALCON, MD;  Location: [facility];  Service: General;;   BREAST SURGERY Bilateral 1991   mastectomy   CATARACT EXTRACTION Bilateral    CHOLECYSTECTOMY     COLONOSCOPY  2013   Dr Gaylyn   CORONARY STENT INTERVENTION N/A 05/06/2021   Procedure: CORONARY STENT INTERVENTION;  Surgeon: Ammon Blunt, MD;  Location: [facility];  Service: Cardiovascular;  Laterality: N/A;   HALLUX VALGUS AUSTIN Right 06/03/2015   Procedure: HALLUX VALGUS AUSTIN;  Surgeon: Eva Gay, DPM;  Location: [facility];  Service: Podiatry;  Laterality: Right;  WITH POPLITEAL   KNEE ARTHROSCOPY WITH MEDIAL MENISECTOMY Right 08/30/2016   Procedure: KNEE ARTHROSCOPY WITH PARTIAL MEDIAL MENISECTOMY and Partial Lateral Menisectomy;  Surgeon: Kathlynn Sharper, MD;  Location: [facility];  Service: Orthopedics;  Laterality: Right;   KNEE ARTHROSCOPY WITH SUBCHONDROPLASTY Right 08/30/2016   Procedure: KNEE ARTHROSCOPY WITH SUBCHONDROPLASTY;  Surgeon: Kathlynn Sharper, MD;  Location: [facility];  Service: Orthopedics;  Laterality: Right;   LAPAROTOMY N/A 01/17/2018   Procedure: EXPLORATORY LAPAROTOMY;  Surgeon: Jordis Laneta FALCON, MD;  Location: [facility];  Service: General;  Laterality: N/A;   LEFT HEART CATH AND CORONARY ANGIOGRAPHY Left 05/06/2021   Procedure: LEFT HEART CATH AND CORONARY ANGIOGRAPHY;  Surgeon: Ammon Blunt, MD;  Location: [facility];  Service: Cardiovascular;  Laterality: Left;   MASTECTOMY Bilateral    ROTATOR CUFF REPAIR Bilateral    TONSILLECTOMY     TOTAL KNEE ARTHROPLASTY Left    TOTAL KNEE ARTHROPLASTY Right 05/30/2017   Procedure: TOTAL KNEE ARTHROPLASTY;  Surgeon: Kathlynn Sharper, MD;  Location: [facility];  Service: Orthopedics;  Laterality: Right;   VAGINAL HYSTERECTOMY      Social History:  reports that she quit smoking about 35 years ago. Her smoking use included cigarettes. She started smoking about 45 years ago. She has a 2.5 pack-year smoking history. She has been  exposed to tobacco smoke. She has never used smokeless tobacco. She reports that she does not drink alcohol and does not use drugs.  Family History:  Family History  Problem Relation Age of Onset   Pulmonary embolism Mother    Healthy Father      Prior to Admission medications  Medication Sig Start Date End Date Taking? Authorizing Provider  amLODipine  (NORVASC ) 5 MG tablet Take 5 mg by mouth daily. 02/13/23  Yes [provider]  cyanocobalamin  (VITAMIN B12) 1000 MCG tablet Take 1,000 mcg by mouth daily.   Yes [provider]  EUTHYROX  50 MCG tablet Take 50 mcg by mouth daily before breakfast. Dr Cherilyn 10/29/19  Yes [provider]  hydrALAZINE  (APRESOLINE ) 25 MG tablet Take 25 mg by mouth 3 (three) times daily. 01/09/24  Yes [provider]  isosorbide  mononitrate (IMDUR ) 60 MG 24 hr tablet Take 1 tablet (60 mg total) by mouth daily. 08/30/22  Yes Joshua Cathryne BROCKS, MD  omeprazole  (PRILOSEC) 20 MG capsule Take 1 capsule by mouth once daily 03/22/23  Yes Jones, Deanna C, MD  potassium chloride  (KLOR-CON ) 10 MEQ tablet Take 20 mEq by mouth at bedtime. 02/19/24 08/17/24 Yes [provider]  tobramycin-dexamethasone  ANTHONEY) ophthalmic solution Apply 1 drop to eye every 6 (  six) hours as needed. 09/26/23  Yes [provider]  Vitamin D, Ergocalciferol, (DRISDOL) 1.25 MG (50000 UNIT) CAPS capsule Take 50,000 Units by mouth every 7 (seven) days. Every wedsnesday   Yes [provider]  ALPRAZolam  (XANAX ) 0.25 MG tablet Take 1 tablet (0.25 mg total) by mouth 2 (two) times daily as needed for anxiety. Patient not taking: Reported on 04/27/2023 11/16/21   Joshua Cathryne BROCKS, MD  benzonatate  (TESSALON  PERLES) 100 MG capsule Take 1 capsule (100 mg total) by mouth 3 (three) times daily as needed for cough. Patient not taking: Reported on 04/27/2023 08/15/22   Joshua Cathryne BROCKS, MD  clopidogrel  (PLAVIX ) 75 MG tablet Take 1 tablet (75 mg total) by mouth daily  with breakfast. Patient not taking: Reported on 04/27/2023 08/30/22   Joshua Cathryne BROCKS, MD  metoprolol  succinate (TOPROL -XL) 25 MG 24 hr tablet Take 1 tablet (25 mg total) by mouth daily. Patient not taking: Reported on 04/27/2023 08/30/22   Joshua Cathryne BROCKS, MD  sertraline  (ZOLOFT ) 25 MG tablet Take 1 tablet (25 mg total) by mouth daily. Patient not taking: Reported on 04/27/2023 08/30/22   Joshua Cathryne BROCKS, MD    Physical Exam: Vitals:   03/10/24 1700 03/10/24 1730 03/10/24 1800 03/10/24 1830  BP: (!) 196/106 (!) 190/100 (!) 187/99 (!) 187/92  Pulse: 78 63 61 (!) 58  Resp: 20 20 18 16   Temp:      TempSrc:      SpO2: 96% 97% 91% 94%  Weight:      Height:       General: Not in acute distress HEENT:       Eyes: PERRL, EOMI, no jaundice       ENT: No discharge from the ears and nose, no pharynx injection, no tonsillar enlargement.        Neck: No JVD, no bruit, no mass felt. Heme: No neck lymph node enlargement. Cardiac: S1/S2, RRR, 2/6 systolic murmurs, No gallops or rubs. Respiratory: No rales, wheezing, rhonchi or rubs. GI: Soft, nondistended, nontender, no rebound pain, no organomegaly, BS present. GU: No hematuria Ext: No pitting leg edema bilaterally. 1+DP/PT pulse bilaterally. Musculoskeletal: No joint deformities, No joint redness or warmth, no limitation of ROM in spin. Skin: No rashes.  Neuro: Alert, oriented X3, cranial nerves II-XII grossly intact, moves all extremities normally.  Psych: Patient is not psychotic, no suicidal or hemocidal ideation.  Labs on Admission: I have personally reviewed following labs and imaging studies  CBC: Recent Labs  Lab 03/10/24 1636  WBC 5.8  HGB 12.9  HCT 39.0  MCV 90.5  PLT 136*   Basic Metabolic Panel: Recent Labs  Lab 03/10/24 1636  NA 137  K 4.0  CL 102  CO2 21*  GLUCOSE 120*  BUN 13  CREATININE 0.86  CALCIUM  9.9   GFR: Estimated Creatinine Clearance: 47.7 mL/min (by C-G formula based on SCr of 0.86 mg/dL). Liver  Function Tests: No results for input(s): AST, ALT, ALKPHOS, BILITOT, PROT, ALBUMIN in the last 168 hours. No results for input(s): LIPASE, AMYLASE in the last 168 hours. No results for input(s): AMMONIA in the last 168 hours. Coagulation Profile: No results for input(s): INR, PROTIME in the last 168 hours. Cardiac Enzymes: No results for input(s): CKTOTAL, CKMB, CKMBINDEX, TROPONINI in the last 168 hours. BNP (last 3 results) No results for input(s): PROBNP in the last 8760 hours. HbA1C: No results for input(s): HGBA1C in the last 72 hours. CBG: No results for input(s): GLUCAP in the  last 168 hours. Lipid Profile: No results for input(s): CHOL, HDL, LDLCALC, TRIG, CHOLHDL, LDLDIRECT in the last 72 hours. Thyroid  Function Tests: No results for input(s): TSH, T4TOTAL, FREET4, T3FREE, THYROIDAB in the last 72 hours. Anemia Panel: No results for input(s): VITAMINB12, FOLATE, FERRITIN, TIBC, IRON, RETICCTPCT in the last 72 hours. Urine analysis:    Component Value Date/Time   COLORURINE COLORLESS (A) 01/01/2020 0622   APPEARANCEUR CLEAR (A) 01/01/2020 0622   LABSPEC 1.006 01/01/2020 0622   PHURINE 8.0 01/01/2020 0622   GLUCOSEU NEGATIVE 01/01/2020 0622   HGBUR NEGATIVE 01/01/2020 0622   BILIRUBINUR NEGATIVE 01/01/2020 0622   KETONESUR 5 (A) 01/01/2020 0622   PROTEINUR 100 (A) 01/01/2020 0622   NITRITE NEGATIVE 01/01/2020 0622   LEUKOCYTESUR NEGATIVE 01/01/2020 0622   Sepsis Labs: @LABRCNTIP (procalcitonin:4,lacticidven:4) )No results found for this or any previous visit (from the past 240 hours).   Radiological Exams on Admission:   Assessment/Plan Principal Problem:   Hypertensive urgency Active Problems:   Chest pain   CAD (coronary artery disease)   Hypothyroidism   Thoracoabdominal aortic aneurysm (TAAA)   PAF (paroxysmal atrial fibrillation) (HCC)   Thrombocytopenia   Lung nodule   Anxiety and  depression   Overweight (BMI 25.0-29.9)   Assessment and Plan:   Principal Problem:   Hypertensive urgency Active Problems:   Chest pain   CAD (coronary artery disease)   Hypothyroidism   Thoracoabdominal aortic aneurysm (TAAA)   PAF (paroxysmal atrial fibrillation) (HCC)   Thrombocytopenia   Lung nodule   Anxiety and depression   Overweight (BMI 25.0-29.9)      DVT ppx: on IV Heparin     Code Status: Full code per pt  Family Communication:    Yes, patient's daughter   at bed side.      Disposition Plan:  Anticipate discharge back to previous environment  Consults called: Dr. Florencio of cardiology  Admission status and Level of care: Progressive:    for obs   Dispo: The patient is from: Home              Anticipated d/c is to: Home              Anticipated d/c date is: 1 day              Patient currently is not medically stable to d/c.    Severity of Illness:  The appropriate patient status for this patient is OBSERVATION. Observation status is judged to be reasonable and necessary in order to provide the required intensity of service to ensure the patient's safety. The patient's presenting symptoms, physical exam findings, and initial radiographic and laboratory data in the context of their medical condition is felt to place them at decreased risk for further clinical deterioration. Furthermore, it is anticipated that the patient will be medically stable for discharge from the hospital within 2 midnights of admission.        Date of Service 03/10/2024    Caleb Exon [facility]   If 7PM-7AM, please contact night-coverage [email or web address] 03/10/2024, 8:36 PM     [1]  Allergies Allergen Reactions   Nitroglycerin  Other (See Comments)    Bottoms out BP to 60's    Nitroglycerin  In D5w Other (See Comments)    Bottoms out BP    Valsartan  Other (See Comments)    Dizziness   Adhesive [Tape] Dermatitis    Can not use plastic tape. Paper tape ok     Amlodipine  Swelling    Throat swelling  Asa [Aspirin ] Itching   Hydralazine  Other (See Comments)    Oliguria (low urine output)   Hydrochlorothiazide  Other (See Comments)    Hypokalemia leading to AFib   Penicillin G Itching    Has patient had a PCN reaction causing immediate rash, facial/tongue/throat swelling, SOB or lightheadedness with hypotension: No Has patient had a PCN reaction causing severe rash involving mucus membranes or skin necrosis: No Has patient had a PCN reaction that required hospitalization: No Has patient had a PCN reaction occurring within the last 10 years: No If all of the above answers are NO, then may proceed with Cephalosporin use.    Hydrocodone -Acetaminophen  Itching and Rash   "

## 2024-03-10 NOTE — ED Notes (Signed)
 Activated [facility] with [facility]  [phone]

## 2024-03-11 ENCOUNTER — Observation Stay: Admit: 2024-03-11 | Discharge: 2024-03-11 | Disposition: A | Attending: Internal Medicine | Admitting: Internal Medicine

## 2024-03-11 DIAGNOSIS — D696 Thrombocytopenia, unspecified: Secondary | ICD-10-CM | POA: Diagnosis present

## 2024-03-11 DIAGNOSIS — I716 Thoracoabdominal aortic aneurysm, without rupture, unspecified: Secondary | ICD-10-CM | POA: Diagnosis present

## 2024-03-11 DIAGNOSIS — E039 Hypothyroidism, unspecified: Secondary | ICD-10-CM | POA: Diagnosis present

## 2024-03-11 DIAGNOSIS — I160 Hypertensive urgency: Secondary | ICD-10-CM | POA: Diagnosis not present

## 2024-03-11 DIAGNOSIS — I469 Cardiac arrest, cause unspecified: Secondary | ICD-10-CM | POA: Diagnosis not present

## 2024-03-11 DIAGNOSIS — E785 Hyperlipidemia, unspecified: Secondary | ICD-10-CM | POA: Diagnosis present

## 2024-03-11 DIAGNOSIS — I2111 ST elevation (STEMI) myocardial infarction involving right coronary artery: Secondary | ICD-10-CM | POA: Diagnosis present

## 2024-03-11 DIAGNOSIS — I213 ST elevation (STEMI) myocardial infarction of unspecified site: Secondary | ICD-10-CM | POA: Diagnosis not present

## 2024-03-11 DIAGNOSIS — F32A Depression, unspecified: Secondary | ICD-10-CM | POA: Diagnosis present

## 2024-03-11 DIAGNOSIS — K7581 Nonalcoholic steatohepatitis (NASH): Secondary | ICD-10-CM | POA: Diagnosis present

## 2024-03-11 DIAGNOSIS — E876 Hypokalemia: Secondary | ICD-10-CM | POA: Diagnosis present

## 2024-03-11 DIAGNOSIS — Z79899 Other long term (current) drug therapy: Secondary | ICD-10-CM | POA: Diagnosis not present

## 2024-03-11 DIAGNOSIS — E872 Acidosis, unspecified: Secondary | ICD-10-CM | POA: Diagnosis present

## 2024-03-11 DIAGNOSIS — I480 Paroxysmal atrial fibrillation: Secondary | ICD-10-CM | POA: Diagnosis present

## 2024-03-11 DIAGNOSIS — R570 Cardiogenic shock: Secondary | ICD-10-CM | POA: Diagnosis present

## 2024-03-11 DIAGNOSIS — D649 Anemia, unspecified: Secondary | ICD-10-CM | POA: Diagnosis present

## 2024-03-11 DIAGNOSIS — I10 Essential (primary) hypertension: Secondary | ICD-10-CM | POA: Diagnosis present

## 2024-03-11 DIAGNOSIS — K219 Gastro-esophageal reflux disease without esophagitis: Secondary | ICD-10-CM | POA: Diagnosis present

## 2024-03-11 DIAGNOSIS — I462 Cardiac arrest due to underlying cardiac condition: Secondary | ICD-10-CM | POA: Diagnosis present

## 2024-03-11 DIAGNOSIS — K279 Peptic ulcer, site unspecified, unspecified as acute or chronic, without hemorrhage or perforation: Secondary | ICD-10-CM | POA: Diagnosis present

## 2024-03-11 DIAGNOSIS — I2510 Atherosclerotic heart disease of native coronary artery without angina pectoris: Secondary | ICD-10-CM | POA: Diagnosis present

## 2024-03-11 DIAGNOSIS — J9690 Respiratory failure, unspecified, unspecified whether with hypoxia or hypercapnia: Secondary | ICD-10-CM | POA: Diagnosis not present

## 2024-03-11 DIAGNOSIS — I4901 Ventricular fibrillation: Secondary | ICD-10-CM | POA: Diagnosis present

## 2024-03-11 DIAGNOSIS — K7460 Unspecified cirrhosis of liver: Secondary | ICD-10-CM | POA: Diagnosis present

## 2024-03-11 DIAGNOSIS — I169 Hypertensive crisis, unspecified: Secondary | ICD-10-CM | POA: Diagnosis present

## 2024-03-11 DIAGNOSIS — J9602 Acute respiratory failure with hypercapnia: Secondary | ICD-10-CM | POA: Diagnosis present

## 2024-03-11 DIAGNOSIS — J9601 Acute respiratory failure with hypoxia: Secondary | ICD-10-CM | POA: Diagnosis present

## 2024-03-11 DIAGNOSIS — I161 Hypertensive emergency: Secondary | ICD-10-CM | POA: Diagnosis present

## 2024-03-11 DIAGNOSIS — R739 Hyperglycemia, unspecified: Secondary | ICD-10-CM | POA: Diagnosis not present

## 2024-03-11 DIAGNOSIS — Z7902 Long term (current) use of antithrombotics/antiplatelets: Secondary | ICD-10-CM | POA: Diagnosis not present

## 2024-03-11 LAB — BASIC METABOLIC PANEL WITH GFR
Anion gap: 12 (ref 5–15)
BUN: 11 mg/dL (ref 8–23)
CO2: 22 mmol/L (ref 22–32)
Calcium: 9.6 mg/dL (ref 8.9–10.3)
Chloride: 105 mmol/L (ref 98–111)
Creatinine, Ser: 0.75 mg/dL (ref 0.44–1.00)
GFR, Estimated: >60 mL/min
Glucose, Bld: 95 mg/dL (ref 70–99)
Potassium: 3.7 mmol/L (ref 3.5–5.1)
Sodium: 139 mmol/L (ref 135–145)

## 2024-03-11 LAB — LIPID PANEL
Cholesterol: 188 mg/dL (ref 0–200)
HDL: 27 mg/dL — ABNORMAL LOW
LDL Cholesterol: 90 mg/dL (ref 0–99)
Total CHOL/HDL Ratio: 7 ratio
Triglycerides: 357 mg/dL — ABNORMAL HIGH
VLDL: 71 mg/dL — ABNORMAL HIGH (ref 0–40)

## 2024-03-11 LAB — HEPARIN LEVEL (UNFRACTIONATED)
Heparin Unfractionated: <0.1 [IU]/mL — ABNORMAL LOW (ref 0.30–0.70)
Heparin Unfractionated: 0.29 [IU]/mL — ABNORMAL LOW (ref 0.30–0.70)

## 2024-03-11 LAB — ECHOCARDIOGRAM COMPLETE
AR max vel: 2.01 cm2
AV Area VTI: 2.15 cm2
AV Area mean vel: 2.04 cm2
AV Mean grad: 11 mmHg
AV Peak grad: 19.5 mmHg
Ao pk vel: 2.21 m/s
Area-P 1/2: 2.34 cm2
Calc EF: 58.4 %
Height: 65 in
MV VTI: 2.66 cm2
P 1/2 time: 463 ms
S' Lateral: 2.15 cm
Single Plane A2C EF: 38.9 %
Single Plane A4C EF: 73.2 %
Weight: 2880 [oz_av]

## 2024-03-11 LAB — CBC
HCT: 33.7 % — ABNORMAL LOW (ref 36.0–46.0)
Hemoglobin: 10.9 g/dL — ABNORMAL LOW (ref 12.0–15.0)
MCH: 29.7 pg (ref 26.0–34.0)
MCHC: 32.3 g/dL (ref 30.0–36.0)
MCV: 91.8 fL (ref 80.0–100.0)
Platelets: 111 K/uL — ABNORMAL LOW (ref 150–400)
RBC: 3.67 MIL/uL — ABNORMAL LOW (ref 3.87–5.11)
RDW: 15.3 % (ref 11.5–15.5)
WBC: 5 K/uL (ref 4.0–10.5)
nRBC: 0 % (ref 0.0–0.2)

## 2024-03-11 LAB — HEMOGLOBIN A1C
Hgb A1c MFr Bld: 5.5 % (ref 4.8–5.6)
Mean Plasma Glucose: 111.15 mg/dL

## 2024-03-11 LAB — TROPONIN T, HIGH SENSITIVITY
Troponin T High Sensitivity: 141 ng/L (ref 0–19)
Troponin T High Sensitivity: 115 ng/L (ref 0–19)

## 2024-03-11 MED ORDER — POTASSIUM CHLORIDE CRYS ER 20 MEQ PO TBCR
40.0000 meq | EXTENDED_RELEASE_TABLET | Freq: Once | ORAL | Status: AC
  Administered 2024-03-11: 40 meq via ORAL
  Filled 2024-03-11: qty 2

## 2024-03-11 MED ORDER — RANOLAZINE ER 500 MG PO TB12
500.0000 mg | ORAL_TABLET | Freq: Two times a day (BID) | ORAL | Status: DC
  Administered 2024-03-11 – 2024-03-13 (×5): 500 mg via ORAL
  Filled 2024-03-11 (×7): qty 1

## 2024-03-11 MED ORDER — HEPARIN BOLUS VIA INFUSION
1100.0000 [IU] | Freq: Once | INTRAVENOUS | Status: AC
  Administered 2024-03-11: 1100 [IU] via INTRAVENOUS
  Filled 2024-03-11: qty 1100

## 2024-03-11 MED ORDER — ACETAMINOPHEN 325 MG PO TABS: 650.0000 mg | ORAL_TABLET | Freq: Four times a day (QID) | ORAL | Status: DC | PRN

## 2024-03-11 MED ORDER — HEPARIN BOLUS VIA INFUSION
2200.0000 [IU] | Freq: Once | INTRAVENOUS | Status: AC
  Administered 2024-03-11: 2200 [IU] via INTRAVENOUS
  Filled 2024-03-11: qty 2200

## 2024-03-11 MED ORDER — ISOSORBIDE MONONITRATE ER 30 MG PO TB24
120.0000 mg | ORAL_TABLET | Freq: Every day | ORAL | Status: DC
  Administered 2024-03-11 – 2024-03-13 (×3): 120 mg via ORAL
  Filled 2024-03-11 (×3): qty 2

## 2024-03-11 NOTE — ED Notes (Signed)
 Up to bathroom to void.  Ambulates well without assist.

## 2024-03-11 NOTE — Consult Note (Signed)
 PHARMACY - ANTICOAGULATION CONSULT NOTE  Pharmacy Consult for Heparin  Indication: chest pain/ACS  Allergies[1]  Patient Measurements: Height: 5' 5 (165.1 cm) Weight: 81.6 kg (180 lb) IBW/kg (Calculated) : 57 HEPARIN  DW (KG): 74.4  Vital Signs: Temp: 97.7 F (36.5 C) (12/29 1659) Temp Source: Oral (12/29 1449) BP: 112/75 (12/29 1659) Pulse Rate: 77 (12/29 1659)  Labs: Recent Labs    03/10/24 1636 03/10/24 2007 03/11/24 0514 03/11/24 1627  HGB 12.9  --  10.9*  --   HCT 39.0  --  33.7*  --   PLT 136*  --  111*  --   APTT  --  30  --   --   LABPROT  --  14.6  --   --   INR  --  1.1  --   --   HEPARINUNFRC  --   --  <0.10* 0.29*  CREATININE 0.86  --  0.75  --     Estimated Creatinine Clearance: 51.3 mL/min (by C-G formula based on SCr of 0.75 mg/dL).   Medical History: Past Medical History:  Diagnosis Date   Anxiety    Arthritis    thumbs, hands   Atrial fibrillation (HCC)    Cancer (HCC) 1991   bilateral breast   Depression    GERD (gastroesophageal reflux disease)    Headache    occasional - pinched nerve in neck   Hypertension    Hypothyroidism    Thyroid  disease    Wears dentures    full upper, partial lower    Medications:  No history of chronic anticoagulant use PTA  Assessment: 89 y.o. female with a history of hypertension, atrial fibrillation not on anticoagulation and thoracic aortic aneurysm who is with chest pain, acute onset around 2 PM, severe intensity, and radiating to her neck and to her back. Troponin level of 57. Pharmacy has been consulted to initiate and dose continuous heparin  infusion. Baseline labs have been ordered and are pending.  Goal of Therapy:  Heparin  level 0.3-0.7 units/ml Monitor platelets by anticoagulation protocol: Yes  12/29 0514 HL < 0.1, subtherapeutic 12/29 1627 HL 0.29, subtherapeutic    Plan:  Give 1100 units bolus x 1 increase heparin  infusion to 1250 units/hr Check anti-Xa level in 8 hours after rate  change Continue to monitor H&H and platelets  Lum Mania, PharmD, BCPS 03/11/2024 5:01 PM        [1]  Allergies Allergen Reactions   Nitroglycerin  Other (See Comments)    Bottoms out BP to 60's    Nitroglycerin  In D5w Other (See Comments)    Bottoms out BP    Valsartan  Other (See Comments)    Dizziness   Adhesive [Tape] Dermatitis    Can not use plastic tape. Paper tape ok    Amlodipine  Swelling    Throat swelling   Asa [Aspirin ] Itching   Hydralazine  Other (See Comments)    Oliguria (low urine output)   Hydrochlorothiazide  Other (See Comments)    Hypokalemia leading to AFib   Penicillin G Itching    Has patient had a PCN reaction causing immediate rash, facial/tongue/throat swelling, SOB or lightheadedness with hypotension: No Has patient had a PCN reaction causing severe rash involving mucus membranes or skin necrosis: No Has patient had a PCN reaction that required hospitalization: No Has patient had a PCN reaction occurring within the last 10 years: No If all of the above answers are NO, then may proceed with Cephalosporin use.    Hydrocodone -Acetaminophen  Itching and Rash

## 2024-03-11 NOTE — Progress Notes (Signed)
 [facility] CLINIC CARDIOLOGY PROGRESS NOTE   Patient ID: Kaitlyn Oliver MRN: [id number] DOB/AGE: [birth year] 88 y.o.  Admit date: 03/10/2024 Referring Physician Dr. Waylon Cassis Primary Physician Salli Amato, MD  Primary Cardiologist Dr. Ammon Reason for Consultation atypical chest pain  HPI: Kaitlyn Oliver is a 89 y.o. female with a past medical history of coronary artery disease, paroxysmal atrial fibrillation, hypertension, hyperlipidemia who presented to the ED on 03/10/2024 for jaw pain. Cardiology was consulted for further evaluation.   Interval History:  -Patient seen and examined this AM, resting in bed with daughter at bedside.  -States she is feeling ok today, no recurrence of her pain as she has not had anything to eat.  -BP elevated this AM. No events noted on tele.   Review of systems complete and found to be negative unless listed above   Vitals:   03/11/24 0930 03/11/24 0945 03/11/24 1000 03/11/24 1015  BP: (!) 180/92  (!) 181/82   Pulse: (!) 54 (!) 56 (!) 57 (!) 56  Resp: 16 14 17 14   Temp:      TempSrc:      SpO2: 94% 96% 97% 97%  Weight:      Height:        No intake or output data in the 24 hours ending 03/11/24 1052   PHYSICAL EXAM General: Chronically ill appearing elderly female, well nourished, in no acute distress. HEENT: Normocephalic and atraumatic. Neck: No JVD.  Lungs: Normal respiratory effort on room air. Clear bilaterally to auscultation. No wheezes, crackles, rhonchi.  Heart: HRRR. Normal S1 and S2 without gallops or murmurs. Radial & DP pulses 2+ bilaterally. Abdomen: Non-distended appearing.  Msk: Normal strength and tone for age. Extremities: No clubbing, cyanosis or edema.   Neuro: Alert and oriented X 3. Psych: Mood appropriate, affect congruent.    LABS: Basic Metabolic Panel: Recent Labs    03/10/24 1636 03/11/24 0514  NA 137 139  K 4.0 3.7  CL 102 105  CO2 21* 22  GLUCOSE 120* 95  BUN 13 11   CREATININE 0.86 0.75  CALCIUM  9.9 9.6   Liver Function Tests: No results for input(s): AST, ALT, ALKPHOS, BILITOT, PROT, ALBUMIN in the last 72 hours. No results for input(s): LIPASE, AMYLASE in the last 72 hours. CBC: Recent Labs    03/10/24 1636 03/11/24 0514  WBC 5.8 5.0  HGB 12.9 10.9*  HCT 39.0 33.7*  MCV 90.5 91.8  PLT 136* 111*   Cardiac Enzymes: No results for input(s): CKTOTAL, CKMB, CKMBINDEX, TROPONINIHS in the last 72 hours. BNP: No results for input(s): BNP in the last 72 hours. D-Dimer: No results for input(s): DDIMER in the last 72 hours. Hemoglobin A1C: Recent Labs    03/10/24 1636  HGBA1C 5.5   Fasting Lipid Panel: Recent Labs    03/11/24 0319  CHOL 188  HDL 27*  LDLCALC 90  TRIG 642*  CHOLHDL 7.0   Thyroid  Function Tests: No results for input(s): TSH, T4TOTAL, T3FREE, THYROIDAB in the last 72 hours.  Invalid input(s): FREET3 Anemia Panel: No results for input(s): VITAMINB12, FOLATE, FERRITIN, TIBC, IRON, RETICCTPCT in the last 72 hours.  CT Angio Chest/Abd/Pel for Dissection W and/or Wo Contrast Result Date: 03/10/2024 EXAM: CTA CHEST, ABDOMEN AND PELVIS WITHOUT AND WITH CONTRAST 03/10/2024 05:43:55 PM TECHNIQUE: CTA of the chest was performed without and with the administration of intravenous contrast. CTA of the abdomen and pelvis was performed without and with the administration of intravenous contrast. 100 mL of  iohexol  (OMNIPAQUE ) 350 MG/ML injection was administered. Multiplanar reformatted images are provided for review. MIP images are provided for review. Automated exposure control, iterative reconstruction, and/or weight based adjustment of the mA/kV was utilized to reduce the radiation dose to as low as reasonably achievable. COMPARISON: CT chest abdomen and pelvis 11/19/2021, CT abdomen and pelvis 07/27/2020. CLINICAL HISTORY: Acute aortic syndrome (AAS) suspected. FINDINGS: VASCULATURE:  AORTA: There is an ascending aortic aneurysm measuring 4.5 x 4.4 cm which has increased in size compared to the prior study. There are mild calcified atherosclerotic plaques throughout the aorta. No abdominal aortic aneurysm. No evidence for dissection. Calcified atherosclerotic disease present. PULMONARY ARTERIES: There is no central pulmonary embolism. GREAT VESSELS OF AORTIC ARCH: No acute finding. No dissection. No arterial occlusion or significant stenosis. CELIAC TRUNK: There is mild stenosis at the origin of the celiac axis. SUPERIOR MESENTERIC ARTERY: No acute finding. No occlusion or significant stenosis. INFERIOR MESENTERIC ARTERY: There is mild stranding surrounding the proximal aspect of the inferior mesenteric artery. Inferior mesenteric artery otherwise appears within normal limits. RENAL ARTERIES: No acute finding. No occlusion or significant stenosis. ILIAC ARTERIES: No acute finding. No occlusion or significant stenosis. CHEST: MEDIASTINUM: The heart is mildly enlarged. There are calcified atherosclerotic plaques within the coronary arteries as well. No mediastinal lymphadenopathy. The pericardium demonstrates no acute abnormality. LUNGS AND PLEURA: New scattered pulmonary nodules within the bilateral lower lobes and upper lobes measuring up to 4 mm. There is no new focal lung consolidation, pleural effusion or pneumothorax. THORACIC BONES AND SOFT TISSUES: There are postsurgical changes in both shoulders. ABDOMEN AND PELVIS: LIVER: There is diffuse nodular liver contour compatible with cirrhosis. There are calcified granulomas in the liver. GALLBLADDER AND BILE DUCTS: The gallbladder is surgically absent. No biliary ductal dilatation. SPLEEN: The spleen is unremarkable. PANCREAS: The pancreas is unremarkable. ADRENAL GLANDS: Bilateral adrenal glands demonstrate no acute abnormality. KIDNEYS, URETERS AND BLADDER: There is scarring in the right kidney. Mildly hyperdense area in the posterior left  kidney measures 18 mm. There are punctate calculi in both kidneys. No hydronephrosis. No perinephric or periureteral stranding. Urinary bladder is unremarkable. GI AND BOWEL: The appendix is not seen. There is diffuse colonic diverticulosis. Proximal jejunal diverticulum appears unchanged. Stomach and duodenal sweep demonstrate no acute abnormality. There is no bowel obstruction. No abnormal bowel wall thickening or distension. REPRODUCTIVE: The uterus is surgically absent. PERITONEUM AND RETROPERITONEUM: No ascites or free air. LYMPH NODES: No lymphadenopathy. ABDOMINAL BONES AND SOFT TISSUES: No acute abnormality of the bones. No acute soft tissue abnormality. IMPRESSION: 1. Ascending thoracic aortic aneurysm measuring 4.5 x 4.4 cm, increased in size compared to the prior study, without thoracic aortic dissection. Recommend semi-annual imaging follow-up by CTA or MRA and referral to cardiothoracic surgery if not already obtained. This recommendation follows 2010 accf/aha/aats/ACR/asa/sca/scai/sir/sts/svm guidelines (circulation 2010;121:E266-e369). Aortic aneurysm nos ([id number]). 2. New scattered pulmonary nodules within the bilateral lower lobes and upper lobes measuring up to 4 mm, without focal lung consolidation, pleural effusion, or pneumothorax. 3. Diffuse nodular liver contour compatible with cirrhosis. 4. Mild stenosis at the origin of the celiac axis. 5. Mild stranding surrounding the proximal aspect of the inferior mesenteric artery. Findings may be inflammatory. No evidence for occlusion or dissection. Electronically signed by: Greig Pique MD 03/10/2024 07:01 PM EST RP Workstation: [id number]   DG Chest Port 1 View Result Date: 03/10/2024 CLINICAL DATA:  Chest pain. EXAM: PORTABLE CHEST 1 VIEW COMPARISON:  04/27/2023 FINDINGS: The cardiomediastinal contours are stable allowing for differences  in positioning. Aortic atherosclerosis. The lungs are clear. Pulmonary vasculature is normal. No  consolidation, pleural effusion, or pneumothorax. No acute osseous abnormalities are seen. Overlying monitoring leads. IMPRESSION: No active disease. Electronically Signed   By: Andrea Gasman M.D.   On: 03/10/2024 17:32     ECHO pending  TELEMETRY (personally reviewed): NSR rate 50s  EKG (personally reviewed): NSR rate 78 bpm, PACs  DATA reviewed by me 03/11/2024: last 24h vitals tele labs imaging I/O, hospitalist progress note  Principal Problem:   Hypertensive urgency Active Problems:   Hypothyroidism   Thoracoabdominal aortic aneurysm (TAAA)   NSTEMI (non-ST elevated myocardial infarction) (HCC)   CAD (coronary artery disease)   PAF (paroxysmal atrial fibrillation) (HCC)   Overweight (BMI 25.0-29.9)   Thrombocytopenia   Lung nodule    ASSESSMENT AND PLAN: Kaitlyn Oliver is a 89 y.o. female with a past medical history of coronary artery disease, paroxysmal atrial fibrillation, hypertension, hyperlipidemia who presented to the ED on 03/10/2024 for jaw pain. Cardiology was consulted for further evaluation.   # Atypical chest pain # Jaw pain # Coronary artery disease # Elevated troponin # Paroxysmal atrial fibrillation Patient presented for evaluation of jaw pain that radiates down her neck into her chest and back. Symptoms began ~2 weeks ago, worse after eating. Initial EKG with concern for STE but improved on repeats. Troponins 57 > 171 > 141 > 115.  -Echo ordered, further recommendations pending these results.  -Will continue IV heparin  for today. -Increase imdur  120 mg daily. Continue amlodipine  10 mg daily.  -Continue atorvastatin  40 mg daily. -Patient is not on anticoagulation for PAF, has historically refused this.  This patient's case was discussed and created with Dr. Wilburn and he is in agreement.  Signed:  Danita Bloch, PA-C  03/11/2024, 10:52 AM [facility] Cardiology

## 2024-03-11 NOTE — ED Notes (Signed)
 Back to stretcher, returned to monitor, heparin  infusing. NSL flushed.

## 2024-03-11 NOTE — ED Notes (Addendum)
 Admitting MD finished at [facility]. Echo in to see. Pt up to b/r, steady gait. VSS. Alert, NAD, calm, interactive, resps e/u, speaking in clear complete sentences, skin W&D. Endorses return of the L neck and chest discomfort. Rates 3/10. Noted return before getting up to go the b/r. Denies sob, nausea, or dizziness.

## 2024-03-11 NOTE — Consult Note (Addendum)
 PHARMACY - ANTICOAGULATION CONSULT NOTE  Pharmacy Consult for Heparin  Indication: chest pain/ACS  Allergies[1]  Patient Measurements: Height: 5' 5 (165.1 cm) Weight: 81.6 kg (180 lb) IBW/kg (Calculated) : 57 HEPARIN  DW (KG): 74.4  Vital Signs: Temp: 97.8 F (36.6 C) (12/29 0322) Temp Source: Oral (12/29 0322) BP: 164/75 (12/29 0600) Pulse Rate: 55 (12/29 0600)  Labs: Recent Labs    03/10/24 1636 03/10/24 2007 03/11/24 0514  HGB 12.9  --  10.9*  HCT 39.0  --  33.7*  PLT 136*  --  111*  APTT  --  30  --   LABPROT  --  14.6  --   INR  --  1.1  --   HEPARINUNFRC  --   --  <0.10*  CREATININE 0.86  --  0.75    Estimated Creatinine Clearance: 51.3 mL/min (by C-G formula based on SCr of 0.75 mg/dL).   Medical History: Past Medical History:  Diagnosis Date   Anxiety    Arthritis    thumbs, hands   Atrial fibrillation (HCC)    Cancer (HCC) 1991   bilateral breast   Depression    GERD (gastroesophageal reflux disease)    Headache    occasional - pinched nerve in neck   Hypertension    Hypothyroidism    Thyroid  disease    Wears dentures    full upper, partial lower    Medications:  No history of chronic anticoagulant use PTA  Assessment: 89 y.o. female with a history of hypertension, atrial fibrillation not on anticoagulation and thoracic aortic aneurysm who is with chest pain, acute onset around 2 PM, severe intensity, and radiating to her neck and to her back. Troponin level of 57. Pharmacy has been consulted to initiate and dose continuous heparin  infusion. Baseline labs have been ordered and are pending.  Goal of Therapy:  Heparin  level 0.3-0.7 units/ml Monitor platelets by anticoagulation protocol: Yes  12/29 0514 HL < 0.1, subtherapeutic   Plan:  Give 2200 units bolus x 1 Start heparin  infusion at 1100 units/hr Check anti-Xa level in 8 hours after rate change Continue to monitor H&H and platelets  Rankin CANDIE Dills, PharmD, [facility] 03/11/2024 6:37  AM  12/29@0832  RN re[ort heparin  infusion rate was not correct. New rate confirmed and set. Will change next HL time to 8hrs from now.  Raeleen Winstanley Rodriguez-Guzman PharmD, BCPS 03/11/2024 8:33 AM       [1]  Allergies Allergen Reactions   Nitroglycerin  Other (See Comments)    Bottoms out BP to 60's    Nitroglycerin  In D5w Other (See Comments)    Bottoms out BP    Valsartan  Other (See Comments)    Dizziness   Adhesive [Tape] Dermatitis    Can not use plastic tape. Paper tape ok    Amlodipine  Swelling    Throat swelling   Dorethia Grieves ] Itching   Hydralazine  Other (See Comments)    Oliguria (low urine output)   Hydrochlorothiazide  Other (See Comments)    Hypokalemia leading to AFib   Penicillin G Itching    Has patient had a PCN reaction causing immediate rash, facial/tongue/throat swelling, SOB or lightheadedness with hypotension: No Has patient had a PCN reaction causing severe rash involving mucus membranes or skin necrosis: No Has patient had a PCN reaction that required hospitalization: No Has patient had a PCN reaction occurring within the last 10 years: No If all of the above answers are NO, then may proceed with Cephalosporin use.    Hydrocodone -Acetaminophen  Itching and  Rash

## 2024-03-11 NOTE — Progress Notes (Signed)
" °  PROGRESS NOTE    Kaitlyn Oliver  FMW:[id number] DOB: February 09, 1934 DOA: 03/10/2024 PCP: Salli Amato, MD  240A/240A-BB  LOS: 0 days   Brief hospital course:   Assessment & Plan: Kaitlyn Oliver is a 89 y.o. 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    Patient states that she has intermittent chest pain for more than 10 days which has worsened   Hypertensive urgency:   Blood pressure 214/114, responding to treatment, improved to SBP 140-150. Pt received labetalol  5 mg x 2.  Started as needed enalaprilate and increased the dose of hydralazine  and amlodipine . --cont amlodipine  at increased 10 mg daily --cont hydralazine  at increased 50 mg q8h --increase Imdur  from 60 to 120 mg daily   NSTEMI (non-ST elevated myocardial infarction) (HCC) and hx of CAD (coronary artery disease):  trop 57 --> 171.  Cardio consulted. --cont heparin  gtt --Echo  Hypothyroidism --cont Synthroid    Thoracoabdominal aortic aneurysm (TAAA):  CTA showed ascending thoracic aortic aneurysm measuring 4.5 x 4.4 cm, increased in size compared to the prior study, without thoracic aortic dissection.  -will need semi-annual imaging follow-up by CTA or MRA and referral to cardiothoracic surgery   PAF (paroxysmal atrial fibrillation) (HCC):  pt is not taking AC.  Heart rate in 50s   Thrombocytopenia:  This is chronic issue, platelets are 136 -Follow-up with CBC   Pulm nodules --incidental findings by CTA. - Need to follow-up with PCP as outpatient   Overweight (BMI 25.0-29.9): Body weight 85.6 kg, BMI 29.95. - Encourage losing weight - Exercise healthy diet   Cirrhosis --Diffuse nodular liver contour compatible with  cirrhosis.    DVT prophylaxis: On:heparin  gtt Code Status: Full code  Family Communication:  Level of care: Progressive Dispo:   The patient is from: home Anticipated d/c is to: home Anticipated d/c date is: 1-2 days   Subjective and Interval History:  Pt reported no chest pain, but complained of neck and back pain.  No pain in her recent tooth extraction sites.   Objective: Vitals:   03/11/24 1420 03/11/24 1425 03/11/24 1449 03/11/24 1659  BP: 136/87  129/76 112/75  Pulse:   67 77  Resp: 15 16 18    Temp:   97.7 F (36.5 C) 97.7 F (36.5 C)  TempSrc:   Oral   SpO2:  94% 97% 97%  Weight:      Height:        Intake/Output Summary (Last 24 hours) at 03/11/2024 1851 Last data filed at 03/11/2024 1850 Gross per 24 hour  Intake 237.56 ml  Output --  Net 237.56 ml   Filed Weights   03/10/24 1629  Weight: 81.6 kg    Examination:   Constitutional: NAD, AAOx3 HEENT: conjunctivae and lids normal, EOMI CV: No cyanosis.   RESP: normal respiratory effort Neuro: II - XII grossly intact.   Psych: anxious mood and affect.  Appropriate judgement and reason   Data Reviewed: I have personally reviewed labs and imaging studies  Time spent: 50 minutes  Ellouise Haber, MD [facility] If 7PM-7AM, please contact night-coverage 03/11/2024, 6:51 PM   "

## 2024-03-11 NOTE — ED Notes (Signed)
 Reports pain and sob decreasing. Feel better. Family at [facility]. Pt initially refused shared room. Reluctantly agreeable to shared room, when given the option of the hallway. Up with RN via stretcher. Heparin  infusing. No changes. VSS.

## 2024-03-11 NOTE — Plan of Care (Signed)

## 2024-03-11 NOTE — ED Notes (Signed)
 C/o gradual progressive onset of sob. Reports pain improved. Denies pain at this time. LS CTA. SPO2 97% RA. Alert, NAD, calm, interactive, speaking in clear complete sentences. Skin W&D. Will monitor. NSR on monitor. HR 62.

## 2024-03-11 NOTE — ED Notes (Addendum)
 Heparin  rate changed to 1100 units an hour at this time, confirmed with and notified pharmacy. Echo at [facility].

## 2024-03-12 ENCOUNTER — Inpatient Hospital Stay

## 2024-03-12 DIAGNOSIS — I160 Hypertensive urgency: Secondary | ICD-10-CM | POA: Diagnosis not present

## 2024-03-12 LAB — CBC
HCT: 33.5 % — ABNORMAL LOW (ref 36.0–46.0)
Hemoglobin: 11.3 g/dL — ABNORMAL LOW (ref 12.0–15.0)
MCH: 30.1 pg (ref 26.0–34.0)
MCHC: 33.7 g/dL (ref 30.0–36.0)
MCV: 89.3 fL (ref 80.0–100.0)
Platelets: 135 K/uL — ABNORMAL LOW (ref 150–400)
RBC: 3.75 MIL/uL — ABNORMAL LOW (ref 3.87–5.11)
RDW: 15.5 % (ref 11.5–15.5)
WBC: 5.4 K/uL (ref 4.0–10.5)
nRBC: 0 % (ref 0.0–0.2)

## 2024-03-12 LAB — BASIC METABOLIC PANEL WITH GFR
Anion gap: 12 (ref 5–15)
BUN: 12 mg/dL (ref 8–23)
CO2: 21 mmol/L — ABNORMAL LOW (ref 22–32)
Calcium: 9.5 mg/dL (ref 8.9–10.3)
Chloride: 103 mmol/L (ref 98–111)
Creatinine, Ser: 0.86 mg/dL (ref 0.44–1.00)
GFR, Estimated: >60 mL/min
Glucose, Bld: 96 mg/dL (ref 70–99)
Potassium: 4.2 mmol/L (ref 3.5–5.1)
Sodium: 137 mmol/L (ref 135–145)

## 2024-03-12 LAB — HEPARIN LEVEL (UNFRACTIONATED)
Heparin Unfractionated: 0.43 [IU]/mL (ref 0.30–0.70)
Heparin Unfractionated: 0.37 [IU]/mL (ref 0.30–0.70)

## 2024-03-12 MED ORDER — PANTOPRAZOLE SODIUM 40 MG IV SOLR
40.0000 mg | Freq: Two times a day (BID) | INTRAVENOUS | Status: DC
  Administered 2024-03-12 – 2024-03-13 (×2): 40 mg via INTRAVENOUS
  Filled 2024-03-12 (×3): qty 10

## 2024-03-12 MED ORDER — IOHEXOL 350 MG/ML SOLN: 75.0000 mL | Freq: Once | INTRAVENOUS | Status: DC | PRN

## 2024-03-12 MED ORDER — IOHEXOL 300 MG/ML  SOLN
75.0000 mL | Freq: Once | INTRAMUSCULAR | Status: AC | PRN
  Administered 2024-03-12: 75 mL via INTRAVENOUS

## 2024-03-12 MED ORDER — DIPHENHYDRAMINE HCL 25 MG PO CAPS
25.0000 mg | ORAL_CAPSULE | Freq: Once | ORAL | Status: AC
  Administered 2024-03-12: 25 mg via ORAL
  Filled 2024-03-12: qty 1

## 2024-03-12 MED ORDER — LIDOCAINE VISCOUS HCL 2 % MT SOLN
15.0000 mL | OROMUCOSAL | Status: DC | PRN
  Administered 2024-03-13: 15 mL via ORAL
  Filled 2024-03-12 (×2): qty 15

## 2024-03-12 NOTE — Consult Note (Signed)
 PHARMACY - ANTICOAGULATION CONSULT NOTE  Pharmacy Consult for Heparin  Indication: chest pain/ACS  Allergies[1]  Patient Measurements: Height: 5' 5 (165.1 cm) Weight: 81.6 kg (180 lb) IBW/kg (Calculated) : 57 HEPARIN  DW (KG): 74.4  Vital Signs: Temp: 98.1 F (36.7 C) (12/30 0807) Temp Source: Oral (12/30 0807) BP: 157/73 (12/30 0807) Pulse Rate: 55 (12/30 0807)  Labs: Recent Labs    03/10/24 1636 03/10/24 1636 03/10/24 2007 03/11/24 0514 03/11/24 1627 03/12/24 0159 03/12/24 0946  HGB 12.9  --   --  10.9*  --  11.3*  --   HCT 39.0  --   --  33.7*  --  33.5*  --   PLT 136*  --   --  111*  --  135*  --   APTT  --   --  30  --   --   --   --   LABPROT  --   --  14.6  --   --   --   --   INR  --   --  1.1  --   --   --   --   HEPARINUNFRC  --    < >  --  <0.10* 0.29* 0.43 0.37  CREATININE 0.86  --   --  0.75  --  0.86  --    < > = values in this interval not displayed.    Estimated Creatinine Clearance: 47.7 mL/min (by C-G formula based on SCr of 0.86 mg/dL).   Medical History: Past Medical History:  Diagnosis Date   Anxiety    Arthritis    thumbs, hands   Atrial fibrillation (HCC)    Cancer (HCC) 1991   bilateral breast   Depression    GERD (gastroesophageal reflux disease)    Headache    occasional - pinched nerve in neck   Hypertension    Hypothyroidism    Thyroid  disease    Wears dentures    full upper, partial lower    Medications:  No history of chronic anticoagulant use PTA  Assessment: 89 y.o. female with a history of hypertension, atrial fibrillation not on anticoagulation and thoracic aortic aneurysm who is with chest pain, acute onset around 2 PM, severe intensity, and radiating to her neck and to her back. Troponin level of 57. Pharmacy has been consulted to initiate and dose continuous heparin  infusion. Baseline labs have been ordered and are pending.  Goal of Therapy:  Heparin  level 0.3-0.7 units/ml Monitor platelets by anticoagulation  protocol: Yes  12/29 0514 HL < 0.1, subtherapeutic 12/29 1627 HL 0.29, subtherapeutic  12/30 0159 HL 0.43, therapeutic X 1  12/30 0946 HL 0.37    Plan:  Heparin  level is therapeutic. Will continue heparin  infusion at 1250 units/hr. Recheck heparin  level and CBC with AM labs.   Cathaleen GORMAN Blanch, PharmD, BCPS 03/12/2024 11:01 AM          [1]  Allergies Allergen Reactions   Nitroglycerin  Other (See Comments)    Bottoms out BP to 60's    Nitroglycerin  In D5w Other (See Comments)    Bottoms out BP    Valsartan  Other (See Comments)    Dizziness   Adhesive [Tape] Dermatitis    Can not use plastic tape. Paper tape ok    Amlodipine  Swelling    Throat swelling   Dorethia Sans ] Itching   Hydralazine  Other (See Comments)    Oliguria (low urine output)   Hydrochlorothiazide  Other (See Comments)    Hypokalemia leading to  AFib   Penicillin G Itching    Has patient had a PCN reaction causing immediate rash, facial/tongue/throat swelling, SOB or lightheadedness with hypotension: No Has patient had a PCN reaction causing severe rash involving mucus membranes or skin necrosis: No Has patient had a PCN reaction that required hospitalization: No Has patient had a PCN reaction occurring within the last 10 years: No If all of the above answers are NO, then may proceed with Cephalosporin use.    Hydrocodone -Acetaminophen  Itching and Rash

## 2024-03-12 NOTE — Consult Note (Signed)
 "   Inpatient Consultation   Patient ID: Kaitlyn Oliver is a 89 y.o. female.  Requesting Provider: Keller Paterson, MD  Date of Admission: 03/10/2024  Date of Consult: 03/12/2024   Reason for Consultation: jaw pain   Patient's Chief Complaint:   Chief Complaint  Patient presents with   Chest Pain    89 y/o CF c CAD (LAD lesion), h/o Bilateral breast ca, GERD, Thoracic AAA, chronic diarrhea, NASH Cirrhosis, EPI, PUD, Small bowel neuroendocrine tumor s/p resection, Afib not on OAC (previously declined) who presented to the hospital with chest pain.  GI is consulted for jaw/neck/suprasternal chest discomfort.  Patient's daughter and granddaughter at bedside who help provide history.  Currently being treated for NSTEMI and on heparin  drip in the hospital.  Patient has known LAD lesion that was medically managed in the past due to her age.  Pain is described in her jaw that radiates down into her neck and to her suprasternal notch.  She denies any dysphagia or odynophagia.  She has GERD that is well-controlled with once daily PPI.  Pain is associated with food.  It seems to be worse with mastication and does not occur if not eating.  The tougher the food, the more pain and difficulty the patient has.  She reports that the pain starts within a few bites.  Interestingly, she had a dental procedure including tooth extraction and new denture fitting on 12/18 which was about when her symptoms started closely thereafter.  She denies dyspnea on exertion and chest pain on exertion.  No melena or hematochezia  Denies NSAIDs, Anti-plt agents, and anticoagulants Denies family history of gastrointestinal disease and malignancy Previous Endoscopies: Remote Colonoscopy Colonoscopy 02/24/12- Dr Gaylyn- hepatic flexure adenoma EGD 02/24/12- Dr Gaylyn. There was no H pylori, Barretts, dysplasia/malignancy Colonoscopy 2003-adenomatous polyps  EGD 2009, and was equivocal for BE. EGD 2008  questionable BE      Past Medical History:  Diagnosis Date   Anxiety    Arthritis    thumbs, hands   Atrial fibrillation (HCC)    Cancer (HCC) 1991   bilateral breast   Depression    GERD (gastroesophageal reflux disease)    Headache    occasional - pinched nerve in neck   Hypertension    Hypothyroidism    Thyroid  disease    Wears dentures    full upper, partial lower    Past Surgical History:  Procedure Laterality Date   BOWEL RESECTION  01/17/2018   Procedure: SMALL BOWEL RESECTION;  Surgeon: Jordis Laneta FALCON, MD;  Location: [facility];  Service: General;;   BREAST SURGERY Bilateral 1991   mastectomy   CATARACT EXTRACTION Bilateral    CHOLECYSTECTOMY     COLONOSCOPY  2013   Dr Gaylyn   CORONARY STENT INTERVENTION N/A 05/06/2021   Procedure: CORONARY STENT INTERVENTION;  Surgeon: Ammon Blunt, MD;  Location: [facility];  Service: Cardiovascular;  Laterality: N/A;   HALLUX VALGUS AUSTIN Right 06/03/2015   Procedure: HALLUX VALGUS AUSTIN;  Surgeon: Eva Gay, DPM;  Location[facility];  Service: Podiatry;  Laterality: Right;  WITH POPLITEAL   KNEE ARTHROSCOPY WITH MEDIAL MENISECTOMY Right 08/30/2016   Procedure: KNEE ARTHROSCOPY WITH PARTIAL MEDIAL MENISECTOMY and Partial Lateral Menisectomy;  Surgeon: Kathlynn Sharper, MD;  Location: [facility];  Service: Orthopedics;  Laterality: Right;   KNEE ARTHROSCOPY WITH SUBCHONDROPLASTY Right 08/30/2016   Procedure: KNEE ARTHROSCOPY WITH SUBCHONDROPLASTY;  Surgeon: Kathlynn Sharper, MD;  Location: [facility];  Service: Orthopedics;  Laterality: Right;   LAPAROTOMY N/A 01/17/2018   Procedure: EXPLORATORY LAPAROTOMY;  Surgeon: Jordis Laneta FALCON, MD;  Location: [facility];  Service: General;  Laterality: N/A;   LEFT HEART CATH AND CORONARY ANGIOGRAPHY Left 05/06/2021   Procedure: LEFT HEART CATH AND CORONARY ANGIOGRAPHY;  Surgeon: Ammon Blunt, MD;  Location: [facility];  Service: Cardiovascular;   Laterality: Left;   MASTECTOMY Bilateral    ROTATOR CUFF REPAIR Bilateral    TONSILLECTOMY     TOTAL KNEE ARTHROPLASTY Left    TOTAL KNEE ARTHROPLASTY Right 05/30/2017   Procedure: TOTAL KNEE ARTHROPLASTY;  Surgeon: Kathlynn Sharper, MD;  Location: [facility];  Service: Orthopedics;  Laterality: Right;   VAGINAL HYSTERECTOMY      Allergies[1]  Family History  Problem Relation Age of Onset   Pulmonary embolism Mother    Healthy Father     Social History[2]   Pertinent GI related history and allergies were reviewed with the patient  Review of Systems  Constitutional:  Negative for activity change, appetite change, chills, diaphoresis, fatigue, fever and unexpected weight change.  HENT:  Negative for trouble swallowing and voice change.        Pain with chewing  Respiratory:  Negative for shortness of breath and wheezing.   Cardiovascular:  Positive for chest pain. Negative for palpitations and leg swelling.  Gastrointestinal:  Positive for diarrhea (chronic). Negative for abdominal distention, abdominal pain, anal bleeding, blood in stool, constipation, nausea, rectal pain and vomiting.  Musculoskeletal:  Positive for neck pain. Negative for arthralgias and myalgias.  Skin:  Negative for color change and pallor.  Neurological:  Negative for dizziness, syncope and weakness.  Psychiatric/Behavioral:  Negative for confusion.   All other systems reviewed and are negative.    Medications Home Medications Medications Ordered Prior to Encounter[3] Pertinent GI related medications were reviewed with the patient  Inpatient Medications Current Medications[4]  heparin  1,250 Units/hr (03/12/24 1147)    acetaminophen , enalaprilat , lidocaine , ondansetron  (ZOFRAN ) IV, oxyCODONE    Objective   Vitals:   03/12/24 0138 03/12/24 0420 03/12/24 0807 03/12/24 1145  BP: (!) 140/86 (!) 162/90 (!) 157/73 119/69  Pulse:  (!) 55 (!) 55 60  Resp: 18 18 18 18   Temp: 98.6 F (37 C) 98 F (36.7 C)  98.1 F (36.7 C) 97.7 F (36.5 C)  TempSrc: Oral  Oral Oral  SpO2: 96% 99% 97% 96%  Weight:      Height:        Physical Exam Vitals and nursing note reviewed.  Constitutional:      General: She is not in acute distress.    Appearance: Normal appearance. She is not ill-appearing, toxic-appearing or diaphoretic.  HENT:     Head: Normocephalic and atraumatic.     Nose: Nose normal.     Mouth/Throat:     Mouth: Mucous membranes are moist.     Pharynx: Oropharynx is clear.  Eyes:     General: No scleral icterus.    Extraocular Movements: Extraocular movements intact.  Cardiovascular:     Rate and Rhythm: Normal rate and regular rhythm.  Pulmonary:     Effort: Pulmonary effort is normal. No respiratory distress.     Breath sounds: Normal breath sounds. No wheezing, rhonchi or rales.  Abdominal:     General: Bowel sounds are normal. There is no distension.     Palpations: Abdomen is soft.     Tenderness: There is no abdominal tenderness. There is no guarding or rebound.  Musculoskeletal:  Cervical back: Neck supple.     Right lower leg: No edema.     Left lower leg: No edema.  Skin:    General: Skin is warm and dry.     Coloration: Skin is not jaundiced or pale.  Neurological:     General: No focal deficit present.     Mental Status: She is alert and oriented to person, place, and time. Mental status is at baseline.  Psychiatric:        Mood and Affect: Mood normal.        Behavior: Behavior normal.        Thought Content: Thought content normal.        Judgment: Judgment normal.     Laboratory Data Recent Labs  Lab 03/10/24 1636 03/11/24 0514 03/12/24 0159  WBC 5.8 5.0 5.4  HGB 12.9 10.9* 11.3*  HCT 39.0 33.7* 33.5*  PLT 136* 111* 135*   Recent Labs  Lab 03/10/24 1636 03/11/24 0514 03/12/24 0159  NA 137 139 137  K 4.0 3.7 4.2  CL 102 105 103  CO2 21* 22 21*  BUN 13 11 12   CALCIUM  9.9 9.6 9.5  GLUCOSE 120* 95 96   Recent Labs  Lab 03/10/24 2007   INR 1.1    No results for input(s): LIPASE in the last 72 hours.      Imaging Studies: DG ESOPHAGUS W SINGLE CM (SOL OR THIN BA) Result Date: 03/12/2024 CLINICAL DATA:  89 year old female. Endorsing jaw and chest pain with eating. Team is requesting esophagram for further evaluation. EXAM: ESOPHAGUS/BARIUM SWALLOW/TABLET STUDY TECHNIQUE: Single contrast examination was performed using thin liquid barium. This exam was performed by Delon Beagle NP, and was supervised and interpreted by Dr. Wilkie Lent. FLUOROSCOPY: Radiation Exposure Index (as provided by the fluoroscopic device): 5.90 mGy Kerma COMPARISON:  None Available. FINDINGS: Swallowing: Appears normal. No vestibular penetration or aspiration seen. 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 Esophageal motility: Nonspecific esophageal dysmotility. Hiatal Hernia: Small hiatal hernia Gastroesophageal reflux: None visualized. Ingested 13mm barium tablet: Not given.  Patient declined Other: None. 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 Nonspecific esophageal dysmotility. Small hiatal hernia. No masses or mucosal lesions. Electronically Signed   By: Wilkie Lent M.D.   On: 03/12/2024 16:26   ECHOCARDIOGRAM COMPLETE Result Date: 03/11/2024    ECHOCARDIOGRAM REPORT   Patient Name:   Melayah ANNIE [facility] Date of Exam: 03/11/2024 Medical Rec #:  [id number]           Height:       65.0 in Accession #:    [id number]          Weight:       180.0 lb Date of Birth:  10/24/1933            BSA:          1.892 m Patient Age:    89 years            BP:           170/89 mmHg Patient Gender: F                   HR:           60 bpm. Exam Location:  [facility] Procedure: 2D Echo, Cardiac Doppler and Color Doppler (Both Spectral and Color            Flow Doppler were utilized during procedure). Indications:  Chest Pain R07.9  History:          Patient has prior history of Echocardiogram examinations, most                  recent 01/01/2020. Signs/Symptoms:Chest Pain.  Sonographer:     Ashley McNeely-Sloane Referring Phys:  [id number] XILIN NIU Diagnosing Phys: Keller Paterson IMPRESSIONS  1. Left ventricular ejection fraction, by estimation, is 45 to 50%. The left ventricle has mildly decreased function. The left ventricle demonstrates regional wall motion abnormalities (see scoring diagram/findings for description). There is mild left ventricular hypertrophy. Left ventricular diastolic parameters are consistent with Grade I diastolic dysfunction (impaired relaxation).  2. Right ventricular systolic function is normal. The right ventricular size is normal.  3. Left atrial size was moderately dilated.  4. The mitral valve is normal in structure. Trivial mitral valve regurgitation. Moderate mitral annular calcification.  5. Tricuspid valve regurgitation is mild to moderate.  6. The aortic valve is tricuspid. Aortic valve regurgitation is mild. Aortic valve sclerosis/calcification is present, without any evidence of aortic stenosis.  7. There is moderate dilatation of the ascending aorta, measuring 46 mm.  8. The inferior vena cava is normal in size with greater than 50% respiratory variability, suggesting right atrial pressure of 3 mmHg. FINDINGS  Left Ventricle: Left ventricular ejection fraction, by estimation, is 45 to 50%. The left ventricle has mildly decreased function. The left ventricle demonstrates regional wall motion abnormalities. The left ventricular internal cavity size was normal in size. There is mild left ventricular hypertrophy. Left ventricular diastolic parameters are consistent with Grade I diastolic dysfunction (impaired relaxation).  LV Wall Scoring: The mid and distal anterior septum, apical lateral segment, mid inferoseptal segment, and apex are hypokinetic. The entire anterior wall, antero-lateral wall, entire inferior wall, posterior  wall, basal anteroseptal segment, and basal inferoseptal segment are normal. Right Ventricle: The right ventricular size is normal. No increase in right ventricular wall thickness. Right ventricular systolic function is normal. Left Atrium: Left atrial size was moderately dilated. Right Atrium: Right atrial size was normal in size. Pericardium: There is no evidence of pericardial effusion. Mitral Valve: The mitral valve is normal in structure. Moderate mitral annular calcification. Trivial mitral valve regurgitation. MV peak gradient, 6.2 mmHg. The mean mitral valve gradient is 3.0 mmHg. Tricuspid Valve: The tricuspid valve is normal in structure. Tricuspid valve regurgitation is mild to moderate. Aortic Valve: The aortic valve is tricuspid. Aortic valve regurgitation is mild. Aortic regurgitation PHT measures 463 msec. Aortic valve sclerosis/calcification is present, without any evidence of aortic stenosis. Aortic valve mean gradient measures 11.0 mmHg. Aortic valve peak gradient measures 19.5 mmHg. Aortic valve area, by VTI measures 2.15 cm. Pulmonic Valve: The pulmonic valve was not well visualized. Pulmonic valve regurgitation is trivial. Aorta: The aortic root is normal in size and structure. There is moderate dilatation of the ascending aorta, measuring 46 mm. Venous: The inferior vena cava is normal in size with greater than 50% respiratory variability, suggesting right atrial pressure of 3 mmHg. IAS/Shunts: The atrial septum is grossly normal.  LEFT VENTRICLE PLAX 2D LVIDd:         5.05 cm     Diastology LVIDs:         2.15 cm     LV e' medial:    5.51 cm/s LV PW:         1.05 cm     LV E/e' medial:  15.8 LV IVS:  1.05 cm     LV e' lateral:   5.76 cm/s LVOT diam:     2.20 cm     LV E/e' lateral: 15.1 LV SV:         105 LV SV Index:   55 LVOT Area:     3.80 cm LV IVRT:       187 msec  LV Volumes (MOD) LV vol d, MOD A2C: 68.1 ml LV vol d, MOD A4C: 81.1 ml LV vol s, MOD A2C: 41.6 ml LV vol s, MOD A4C:  21.7 ml LV SV MOD A2C:     26.5 ml LV SV MOD A4C:     81.1 ml LV SV MOD BP:      43.6 ml RIGHT VENTRICLE             IVC RV Basal diam:  4.80 cm     IVC diam: 1.90 cm RV Mid diam:    3.40 cm RV S prime:     18.60 cm/s  PULMONARY VEINS TAPSE (M-mode): 3.1 cm      A Reversal Duration: 173.00 msec                             A Reversal Velocity: 28.60 cm/s                             Diastolic Velocity:  30.30 cm/s                             S/D Velocity:        2.10                             Systolic Velocity:   62.90 cm/s LEFT ATRIUM             Index        RIGHT ATRIUM           Index LA diam:        4.30 cm 2.27 cm/m   RA Area:     19.30 cm LA Vol (A2C):   95.7 ml 50.59 ml/m  RA Volume:   55.20 ml  29.18 ml/m LA Vol (A4C):   72.2 ml 38.17 ml/m LA Biplane Vol: 83.9 ml 44.36 ml/m  AORTIC VALVE                     PULMONIC VALVE AV Area (Vmax):    2.01 cm      PV Vmax:          0.92 m/s AV Area (Vmean):   2.04 cm      PV Vmean:         60.500 cm/s AV Area (VTI):     2.15 cm      PV VTI:           0.212 m AV Vmax:           221.00 cm/s   PV Peak grad:     3.4 mmHg AV Vmean:          151.000 cm/s  PV Mean grad:     2.0 mmHg AV VTI:            0.487 m       PR End Diast Vel: 3.03 msec  AV Peak Grad:      19.5 mmHg     RVOT Peak grad:   2 mmHg AV Mean Grad:      11.0 mmHg LVOT Vmax:         117.00 cm/s LVOT Vmean:        80.900 cm/s LVOT VTI:          0.275 m LVOT/AV VTI ratio: 0.56 AI PHT:            463 msec  AORTA Ao Root diam: 3.55 cm Ao Asc diam:  4.60 cm MITRAL VALVE                TRICUSPID VALVE MV Area (PHT): 2.34 cm     TR Peak grad:   23.2 mmHg MV Area VTI:   2.66 cm     TR Mean grad:   16.0 mmHg MV Peak grad:  6.2 mmHg     TR Vmax:        241.00 cm/s MV Mean grad:  3.0 mmHg     TR Vmean:       193.0 cm/s MV Vmax:       1.24 m/s MV Vmean:      76.2 cm/s    SHUNTS MV Decel Time: 324 msec     Systemic VTI:  0.28 m MV E velocity: 87.00 cm/s   Systemic Diam: 2.20 cm MV A velocity: 117.00 cm/s   Pulmonic VTI:  0.155 m MV E/A ratio:  0.74 Keller Alluri Electronically signed by Keller Paterson Signature Date/Time: 03/11/2024/2:05:01 PM    Final    CT Angio Chest/Abd/Pel for Dissection W and/or Wo Contrast Result Date: 03/10/2024 EXAM: CTA CHEST, ABDOMEN AND PELVIS WITHOUT AND WITH CONTRAST 03/10/2024 05:43:55 PM TECHNIQUE: CTA of the chest was performed without and with the administration of intravenous contrast. CTA of the abdomen and pelvis was performed without and with the administration of intravenous contrast. 100 mL of iohexol  (OMNIPAQUE ) 350 MG/ML injection was administered. Multiplanar reformatted images are provided for review. MIP images are provided for review. Automated exposure control, iterative reconstruction, and/or weight based adjustment of the mA/kV was utilized to reduce the radiation dose to as low as reasonably achievable. COMPARISON: CT chest abdomen and pelvis 11/19/2021, CT abdomen and pelvis 07/27/2020. CLINICAL HISTORY: Acute aortic syndrome (AAS) suspected. FINDINGS: VASCULATURE: AORTA: There is an ascending aortic aneurysm measuring 4.5 x 4.4 cm which has increased in size compared to the prior study. There are mild calcified atherosclerotic plaques throughout the aorta. No abdominal aortic aneurysm. No evidence for dissection. Calcified atherosclerotic disease present. PULMONARY ARTERIES: There is no central pulmonary embolism. GREAT VESSELS OF AORTIC ARCH: No acute finding. No dissection. No arterial occlusion or significant stenosis. CELIAC TRUNK: There is mild stenosis at the origin of the celiac axis. SUPERIOR MESENTERIC ARTERY: No acute finding. No occlusion or significant stenosis. INFERIOR MESENTERIC ARTERY: There is mild stranding surrounding the proximal aspect of the inferior mesenteric artery. Inferior mesenteric artery otherwise appears within normal limits. RENAL ARTERIES: No acute finding. No occlusion or significant stenosis. ILIAC ARTERIES: No acute finding.  No occlusion or significant stenosis. CHEST: MEDIASTINUM: The heart is mildly enlarged. There are calcified atherosclerotic plaques within the coronary arteries as well. No mediastinal lymphadenopathy. The pericardium demonstrates no acute abnormality. LUNGS AND PLEURA: New scattered pulmonary nodules within the bilateral lower lobes and upper lobes measuring up to 4 mm. There is no new focal lung consolidation, pleural effusion or pneumothorax. THORACIC BONES AND SOFT TISSUES:  There are postsurgical changes in both shoulders. ABDOMEN AND PELVIS: LIVER: There is diffuse nodular liver contour compatible with cirrhosis. There are calcified granulomas in the liver. GALLBLADDER AND BILE DUCTS: The gallbladder is surgically absent. No biliary ductal dilatation. SPLEEN: The spleen is unremarkable. PANCREAS: The pancreas is unremarkable. ADRENAL GLANDS: Bilateral adrenal glands demonstrate no acute abnormality. KIDNEYS, URETERS AND BLADDER: There is scarring in the right kidney. Mildly hyperdense area in the posterior left kidney measures 18 mm. There are punctate calculi in both kidneys. No hydronephrosis. No perinephric or periureteral stranding. Urinary bladder is unremarkable. GI AND BOWEL: The appendix is not seen. There is diffuse colonic diverticulosis. Proximal jejunal diverticulum appears unchanged. Stomach and duodenal sweep demonstrate no acute abnormality. There is no bowel obstruction. No abnormal bowel wall thickening or distension. REPRODUCTIVE: The uterus is surgically absent. PERITONEUM AND RETROPERITONEUM: No ascites or free air. LYMPH NODES: No lymphadenopathy. ABDOMINAL BONES AND SOFT TISSUES: No acute abnormality of the bones. No acute soft tissue abnormality. IMPRESSION: 1. Ascending thoracic aortic aneurysm measuring 4.5 x 4.4 cm, increased in size compared to the prior study, without thoracic aortic dissection. Recommend semi-annual imaging follow-up by CTA or MRA and referral to cardiothoracic  surgery if not already obtained. This recommendation follows 2010 accf/aha/aats/ACR/asa/sca/scai/sir/sts/svm guidelines (circulation 2010;121:E266-e369). Aortic aneurysm nos ([id number]). 2. New scattered pulmonary nodules within the bilateral lower lobes and upper lobes measuring up to 4 mm, without focal lung consolidation, pleural effusion, or pneumothorax. 3. Diffuse nodular liver contour compatible with cirrhosis. 4. Mild stenosis at the origin of the celiac axis. 5. Mild stranding surrounding the proximal aspect of the inferior mesenteric artery. Findings may be inflammatory. No evidence for occlusion or dissection. Electronically signed by: Greig Pique MD 03/10/2024 07:01 PM EST RP Workstation: [id number]   DG Chest Port 1 View Result Date: 03/10/2024 CLINICAL DATA:  Chest pain. EXAM: PORTABLE CHEST 1 VIEW COMPARISON:  04/27/2023 FINDINGS: The cardiomediastinal contours are stable allowing for differences in positioning. Aortic atherosclerosis. The lungs are clear. Pulmonary vasculature is normal. No consolidation, pleural effusion, or pneumothorax. No acute osseous abnormalities are seen. Overlying monitoring leads. IMPRESSION: No active disease. Electronically Signed   By: Andrea Gasman M.D.   On: 03/10/2024 17:32    Assessment:   # Painful mastication with pain radiating down into jaw and neck  # NASH Cirrhosis  # cricopharyngeal bar  # CAD with LAD Lesion  # Afib not on eliquis  # EPI  # PUD  # h/o sbo 2/2 neuroendocrine tumor s/p resection  Plan:   Esophagram ordered by me today- Cricopharyngeal bar present- however, patient denies any dysphagia or odynophagia. This is less likely the etiology of her pain. Eating upon my evaluation without issue (spaghetti/sauce)  CT soft tissue neck pending - recent dental procedure (teeth pulled) and new dentures- which is when this pain started shortly thereafter  Pain is with mastication and eating especially tougher foods. Pain  radiates down from jaw to neck and suprasternal area  Denies Reflux sx on ppi  Maximize ppi therapy to see if this assists pain  Viscous lidocaine  if pain recurs  Being treated and followed for active NSTEMI by cardiology (on heparin  gtt). Given age and active NSTEMI- concern for sedation/endoscopy impact. Patient and family are also concerned and prefer conservative management at this juncture, but ok if EGD is indicated in safer setting. Will continue to monitor and treat patient conservatively as this is not clear GI etiology in her discomfort  F/u outpatient for diarrhea  Dr. Therisa will take over the GI service tmrw and follow the patient's results.  Management of other medical comorbidities as per primary team  I personally performed the service.  Thank you for allowing us  to participate in this patient's care. Please don't hesitate to call if any questions or concerns arise.   Elspeth Ozell Jungling, DO [facility]  Portions of the record may have been created with voice recognition software. Occasional wrong-word or 'sound-a-like' substitutions may have occurred due to the inherent limitations of voice recognition software.  Read the chart carefully and recognize, using context, where substitutions may have occurred.     [1]  Allergies Allergen Reactions   Nitroglycerin  Other (See Comments)    Bottoms out BP to 60's    Nitroglycerin  In D5w Other (See Comments)    Bottoms out BP    Valsartan  Other (See Comments)    Dizziness   Adhesive [Tape] Dermatitis    Can not use plastic tape. Paper tape ok    Amlodipine  Swelling    Throat swelling   Asa [Aspirin ] Itching   Hydralazine  Other (See Comments)    Oliguria (low urine output)   Hydrochlorothiazide  Other (See Comments)    Hypokalemia leading to AFib   Penicillin G Itching    Has patient had a PCN reaction causing immediate rash, facial/tongue/throat swelling, SOB or lightheadedness with hypotension:  No Has patient had a PCN reaction causing severe rash involving mucus membranes or skin necrosis: No Has patient had a PCN reaction that required hospitalization: No Has patient had a PCN reaction occurring within the last 10 years: No If all of the above answers are NO, then may proceed with Cephalosporin use.    Hydrocodone -Acetaminophen  Itching and Rash  [2]  Social History Tobacco Use   Smoking status: Former    Current packs/day: 0.00    Average packs/day: 0.3 packs/day for 10.0 years (2.5 ttl pk-yrs)    Types: Cigarettes    Start date: 51    Quit date: 77    Years since quitting: 35.0    Passive exposure: Past   Smokeless tobacco: Never   Tobacco comments:    smoking cessation materials not required  Vaping Use   Vaping status: Never Used  Substance Use Topics   Alcohol use: No    Alcohol/week: 0.0 standard drinks of alcohol   Drug use: No  [3]  No current facility-administered medications on file prior to encounter.   Current Outpatient Medications on File Prior to Encounter  Medication Sig Dispense Refill   amLODipine  (NORVASC ) 5 MG tablet Take 5 mg by mouth daily.     cyanocobalamin  (VITAMIN B12) 1000 MCG tablet Take 1,000 mcg by mouth daily.     EUTHYROX  50 MCG tablet Take 50 mcg by mouth daily before breakfast. Dr Cherilyn     hydrALAZINE  (APRESOLINE ) 25 MG tablet Take 25 mg by mouth 3 (three) times daily.     isosorbide  mononitrate (IMDUR ) 60 MG 24 hr tablet Take 1 tablet (60 mg total) by mouth daily. 90 tablet 0   omeprazole  (PRILOSEC) 20 MG capsule Take 1 capsule by mouth once daily 90 capsule 0   potassium chloride  (KLOR-CON ) 10 MEQ tablet Take 20 mEq by mouth at bedtime.     tobramycin-dexamethasone  (TOBRADEX) ophthalmic solution Apply 1 drop to eye every 6 (six) hours as needed.     Vitamin D, Ergocalciferol, (DRISDOL) 1.25 MG (50000 UNIT) CAPS capsule Take 50,000 Units by mouth every 7 (seven) days. Every wedsnesday  ALPRAZolam  (XANAX ) 0.25 MG  tablet Take 1 tablet (0.25 mg total) by mouth 2 (two) times daily as needed for anxiety. (Patient not taking: Reported on 04/27/2023) 12 tablet 0   benzonatate  (TESSALON  PERLES) 100 MG capsule Take 1 capsule (100 mg total) by mouth 3 (three) times daily as needed for cough. (Patient not taking: Reported on 04/27/2023) 30 capsule 0   clopidogrel  (PLAVIX ) 75 MG tablet Take 1 tablet (75 mg total) by mouth daily with breakfast. (Patient not taking: Reported on 04/27/2023) 90 tablet 0   metoprolol  succinate (TOPROL -XL) 25 MG 24 hr tablet Take 1 tablet (25 mg total) by mouth daily. (Patient not taking: Reported on 04/27/2023) 90 tablet 0   sertraline  (ZOLOFT ) 25 MG tablet Take 1 tablet (25 mg total) by mouth daily. (Patient not taking: Reported on 04/27/2023) 90 tablet 1  [4]  Current Facility-Administered Medications:    acetaminophen  (TYLENOL ) tablet 650 mg, 650 mg, Oral, Q6H PRN, Niu, Xilin, MD   amLODipine  (NORVASC ) tablet 10 mg, 10 mg, Oral, Daily, Niu, Xilin, MD, 10 mg at 03/12/24 9095   atorvastatin  (LIPITOR) tablet 40 mg, 40 mg, Oral, Daily, Niu, Xilin, MD, 40 mg at 03/12/24 9093   cyanocobalamin  (VITAMIN B12) tablet 1,000 mcg, 1,000 mcg, Oral, Daily, Niu, Xilin, MD, 1,000 mcg at 03/12/24 9092   enalaprilat  (VASOTEC) injection 0.625 mg, 0.625 mg, Intravenous, Q2H PRN, Niu, Xilin, MD   heparin  ADULT infusion 100 units/mL (25000 units/250mL), 1,250 Units/hr, Intravenous, Continuous, Hunt, Madison H, RPH, Last Rate: 12.5 mL/hr at 03/12/24 1147, 1,250 Units/hr at 03/12/24 1147   hydrALAZINE  (APRESOLINE ) tablet 50 mg, 50 mg, Oral, Q8H, Niu, Xilin, MD, 50 mg at 03/12/24 1446   isosorbide  mononitrate (IMDUR ) 24 hr tablet 120 mg, 120 mg, Oral, Daily, Hudson, Caralyn, PA-C, 120 mg at 03/12/24 0907   levothyroxine  (SYNTHROID ) tablet 50 mcg, 50 mcg, Oral, QAC breakfast, Niu, Xilin, MD, 50 mcg at 03/12/24 0606   lidocaine  (XYLOCAINE ) 2 % viscous mouth solution 15 mL, 15 mL, Oral, Q4H PRN, Onita Elspeth Sharper,  DO   ondansetron  (ZOFRAN ) injection 4 mg, 4 mg, Intravenous, Q8H PRN, Niu, Xilin, MD, 4 mg at 03/12/24 0157   oxyCODONE  (Oxy IR/ROXICODONE ) immediate release tablet 5 mg, 5 mg, Oral, Q6H PRN, Niu, Xilin, MD, 5 mg at 03/12/24 9095   pantoprazole  (PROTONIX ) injection 40 mg, 40 mg, Intravenous, Q12H, Rahsaan Weakland Michael, DO   ranolazine  (RANEXA ) 12 hr tablet 500 mg, 500 mg, Oral, BID, Hudson, Caralyn, PA-C, 500 mg at 03/12/24 9095  "

## 2024-03-12 NOTE — Progress Notes (Signed)
 Heart Failure Navigator Progress Note  Assessed for Heart & Vascular TOC clinic readiness.  Patient does not meet criteria due to current [facility] Cardiology patient.   Navigator will sign off at this time.  Roxy Horseman, RN, BSN [facility] Heart Failure Navigator Secure Chat Only

## 2024-03-12 NOTE — Consult Note (Signed)
 PHARMACY - ANTICOAGULATION CONSULT NOTE  Pharmacy Consult for Heparin  Indication: chest pain/ACS  Allergies[1]  Patient Measurements: Height: 5' 5 (165.1 cm) Weight: 81.6 kg (180 lb) IBW/kg (Calculated) : 57 HEPARIN  DW (KG): 74.4  Vital Signs: Temp: 98.6 F (37 C) (12/30 0138) Temp Source: Oral (12/30 0138) BP: 140/86 (12/30 0138) Pulse Rate: 63 (12/29 2342)  Labs: Recent Labs    03/10/24 1636 03/10/24 2007 03/11/24 0514 03/11/24 1627 03/12/24 0159  HGB 12.9  --  10.9*  --  11.3*  HCT 39.0  --  33.7*  --  33.5*  PLT 136*  --  111*  --  135*  APTT  --  30  --   --   --   LABPROT  --  14.6  --   --   --   INR  --  1.1  --   --   --   HEPARINUNFRC  --   --  <0.10* 0.29* 0.43  CREATININE 0.86  --  0.75  --  0.86    Estimated Creatinine Clearance: 47.7 mL/min (by C-G formula based on SCr of 0.86 mg/dL).   Medical History: Past Medical History:  Diagnosis Date   Anxiety    Arthritis    thumbs, hands   Atrial fibrillation (HCC)    Cancer (HCC) 1991   bilateral breast   Depression    GERD (gastroesophageal reflux disease)    Headache    occasional - pinched nerve in neck   Hypertension    Hypothyroidism    Thyroid  disease    Wears dentures    full upper, partial lower    Medications:  No history of chronic anticoagulant use PTA  Assessment: 89 y.o. female with a history of hypertension, atrial fibrillation not on anticoagulation and thoracic aortic aneurysm who is with chest pain, acute onset around 2 PM, severe intensity, and radiating to her neck and to her back. Troponin level of 57. Pharmacy has been consulted to initiate and dose continuous heparin  infusion. Baseline labs have been ordered and are pending.  Goal of Therapy:  Heparin  level 0.3-0.7 units/ml Monitor platelets by anticoagulation protocol: Yes  12/29 0514 HL < 0.1, subtherapeutic 12/29 1627 HL 0.29, subtherapeutic  12/30 0159 HL 0.43, therapeutic X 1    Plan:  12/30:  HL @ 0159 =  0.43, therapeutic X 1 - Will continue pt on current rate and recheck HL in 8 hrs - Continue to monitor H&H and platelets  Wende Longstreth D 03/12/2024 2:53 AM         [1]  Allergies Allergen Reactions   Nitroglycerin  Other (See Comments)    Bottoms out BP to 60's    Nitroglycerin  In D5w Other (See Comments)    Bottoms out BP    Valsartan  Other (See Comments)    Dizziness   Adhesive [Tape] Dermatitis    Can not use plastic tape. Paper tape ok    Amlodipine  Swelling    Throat swelling   Dorethia Compton ] Itching   Hydralazine  Other (See Comments)    Oliguria (low urine output)   Hydrochlorothiazide  Other (See Comments)    Hypokalemia leading to AFib   Penicillin G Itching    Has patient had a PCN reaction causing immediate rash, facial/tongue/throat swelling, SOB or lightheadedness with hypotension: No Has patient had a PCN reaction causing severe rash involving mucus membranes or skin necrosis: No Has patient had a PCN reaction that required hospitalization: No Has patient had a PCN reaction occurring within the  last 10 years: No If all of the above answers are NO, then may proceed with Cephalosporin use.    Hydrocodone -Acetaminophen  Itching and Rash

## 2024-03-12 NOTE — Progress Notes (Signed)
 " PROGRESS NOTE    Kaitlyn Oliver  FMW:[id number] DOB: 14-Oct-1933 DOA: 03/10/2024 PCP: Salli Amato, MD  [id number]/[id number]-BB  LOS: 1 day   Brief hospital course:   Assessment & Plan: Kaitlyn Oliver is a 89 y.o. 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    Patient states that she has intermittent chest pain for more than 10 days which has worsened   Hypertensive urgency:   Blood pressure 214/114, responding to treatment, improved to SBP 140-150. Pt received labetalol  5 mg x 2.  Started as needed enalaprilate and increased the dose of hydralazine  and amlodipine . --cont amlodipine  at increased 10 mg daily --cont hydralazine  at increased 50 mg q8h --cont Imdur  at increased 120 mg daily   NSTEMI (non-ST elevated myocardial infarction) (HCC) and hx of CAD (coronary artery disease):  trop 57 --> 171.  Cardio consulted. --cont heparin  gtt --Echo  Hypothyroidism --cont Synthroid    Thoracoabdominal aortic aneurysm (TAAA):  CTA showed ascending thoracic aortic aneurysm measuring 4.5 x 4.4 cm, increased in size compared to the prior study, without thoracic aortic dissection.  -will need semi-annual imaging follow-up by CTA or MRA and referral to cardiothoracic surgery.  Pt and family aware.   PAF (paroxysmal atrial fibrillation) (HCC):  pt is not taking AC.  Heart rate in 50s   Thrombocytopenia:  This is chronic issue, platelets are 136 -Follow-up with CBC   Pulm nodules --incidental findings by CTA. - Need to follow-up with PCP as outpatient.  Pt and family made aware.   Overweight (BMI 25.0-29.9): Body weight 85.6 kg, BMI 29.95. - Encourage losing weight - Exercise healthy diet    Cirrhosis --Diffuse nodular liver contour compatible with cirrhosis.  Pt and family aware.  Bilateral neck pain --pt reported bilateral neck pain AFTER swallowing solid food, not during. --GI consulted by cardio --CT neck    DVT prophylaxis: On:heparin  gtt Code Status: Full code  Family Communication: daughter updated at bedside today Level of care: Progressive Dispo:   The patient is from: home Anticipated d/c is to: home Anticipated d/c date is: 1-2 days   Subjective and Interval History:  Pt reported bilateral neck pain AFTER she swallowed her solid food.     Objective: Vitals:   03/12/24 0807 03/12/24 1145 03/12/24 1639 03/12/24 1948  BP: (!) 157/73 119/69 123/65 138/78  Pulse: (!) 55 60 60 (!) 58  Resp: 18 18 18 17   Temp: 98.1 F (36.7 C) 97.7 F (36.5 C) 97.6 F (36.4 C) 97.6 F (36.4 C)  TempSrc: Oral Oral Oral   SpO2: 97% 96% 97% 97%  Weight:      Height:        Intake/Output Summary (Last 24 hours) at 03/12/2024 2048 Last data filed at 03/12/2024 0900 Gross per 24 hour  Intake 400 ml  Output --  Net 400 ml   Filed Weights   03/10/24 1629  Weight: 81.6 kg    Examination:   Constitutional: NAD, AAOx3 HEENT: conjunctivae and lids normal, EOMI CV: No cyanosis.   RESP: normal respiratory effort Neuro: II - XII grossly intact.   Psych: Normal mood and affect.  Appropriate judgement and reason   Data Reviewed: I have personally reviewed labs and imaging studies  Time spent: 50 minutes  Ellouise Haber, MD [facility] If 7PM-7AM, please contact night-coverage 03/12/2024, 8:48 PM   "

## 2024-03-12 NOTE — Plan of Care (Signed)
  Problem: Education: Goal: Knowledge of General Education information will improve Description: Including pain rating scale, medication(s)/side effects and non-pharmacologic comfort measures Outcome: Progressing   Problem: Clinical Measurements: Goal: Respiratory complications will improve Outcome: Progressing   Problem: Clinical Measurements: Goal: Cardiovascular complication will be avoided Outcome: Progressing   Problem: Elimination: Goal: Will not experience complications related to bowel motility Outcome: Progressing   Problem: Elimination: Goal: Will not experience complications related to urinary retention Outcome: Progressing   Problem: Pain Managment: Goal: General experience of comfort will improve and/or be controlled Outcome: Progressing   Problem: Safety: Goal: Ability to remain free from injury will improve Outcome: Progressing

## 2024-03-12 NOTE — Progress Notes (Addendum)
 Pt is complaining of itching. NP Donati-Garmon made aware.  Update 0146. See new orders.

## 2024-03-12 NOTE — Progress Notes (Signed)
 [facility] CLINIC CARDIOLOGY PROGRESS NOTE   Patient ID: Kaitlyn Oliver MRN: [id number] DOB/AGE: [birth year] 88 y.o.  Admit date: 03/10/2024 Referring Physician Dr. Waylon Cassis Primary Physician Salli Amato, MD  Primary Cardiologist Dr. Ammon Reason for Consultation atypical chest pain  HPI: Kaitlyn Oliver is a 89 y.o. female with a past medical history of coronary artery disease, paroxysmal atrial fibrillation, hypertension, hyperlipidemia who presented to the ED on 03/10/2024 for jaw pain. Cardiology was consulted for further evaluation.   Interval History:  -Patient seen and examined this AM, resting in bed with daughter at bedside.  - Patient reports that this morning when eating breakfast she again had jaw pain that radiated down into her throat and into her chest. -BP remains elevated but overall improved from initial presentation.  No events noted on telemetry.  Review of systems complete and found to be negative unless listed above   Vitals:   03/12/24 0138 03/12/24 0420 03/12/24 0807 03/12/24 1145  BP: (!) 140/86 (!) 162/90 (!) 157/73 119/69  Pulse:  (!) 55 (!) 55 60  Resp: 18 18 18 18   Temp: 98.6 F (37 C) 98 F (36.7 C) 98.1 F (36.7 C) 97.7 F (36.5 C)  TempSrc: Oral  Oral Oral  SpO2: 96% 99% 97% 96%  Weight:      Height:         Intake/Output Summary (Last 24 hours) at 03/12/2024 1220 Last data filed at 03/12/2024 0900 Gross per 24 hour  Intake 650.06 ml  Output --  Net 650.06 ml     PHYSICAL EXAM General: Chronically ill appearing elderly female, well nourished, in no acute distress. HEENT: Normocephalic and atraumatic. Neck: No JVD.  Lungs: Normal respiratory effort on room air. Clear bilaterally to auscultation. No wheezes, crackles, rhonchi.  Heart: HRRR. Normal S1 and S2 without gallops or murmurs. Radial & DP pulses 2+ bilaterally. Abdomen: Non-distended appearing.  Msk: Normal strength and tone for age. Extremities: No  clubbing, cyanosis or edema.   Neuro: Alert and oriented X 3. Psych: Mood appropriate, affect congruent.    LABS: Basic Metabolic Panel: Recent Labs    03/11/24 0514 03/12/24 0159  NA 139 137  K 3.7 4.2  CL 105 103  CO2 22 21*  GLUCOSE 95 96  BUN 11 12  CREATININE 0.75 0.86  CALCIUM  9.6 9.5   Liver Function Tests: No results for input(s): AST, ALT, ALKPHOS, BILITOT, PROT, ALBUMIN in the last 72 hours. No results for input(s): LIPASE, AMYLASE in the last 72 hours. CBC: Recent Labs    03/11/24 0514 03/12/24 0159  WBC 5.0 5.4  HGB 10.9* 11.3*  HCT 33.7* 33.5*  MCV 91.8 89.3  PLT 111* 135*   Cardiac Enzymes: No results for input(s): CKTOTAL, CKMB, CKMBINDEX, TROPONINIHS in the last 72 hours. BNP: No results for input(s): BNP in the last 72 hours. D-Dimer: No results for input(s): DDIMER in the last 72 hours. Hemoglobin A1C: Recent Labs    03/10/24 1636  HGBA1C 5.5   Fasting Lipid Panel: Recent Labs    03/11/24 0319  CHOL 188  HDL 27*  LDLCALC 90  TRIG 642*  CHOLHDL 7.0   Thyroid  Function Tests: No results for input(s): TSH, T4TOTAL, T3FREE, THYROIDAB in the last 72 hours.  Invalid input(s): FREET3 Anemia Panel: No results for input(s): VITAMINB12, FOLATE, FERRITIN, TIBC, IRON, RETICCTPCT in the last 72 hours.  ECHOCARDIOGRAM COMPLETE Result Date: 03/11/2024    ECHOCARDIOGRAM REPORT   Patient Name:   Kaitlyn Oliver  Date of Exam: 03/11/2024 Medical Rec #:  [id number]           Height:       65.0 in Accession #:    [id number]          Weight:       180.0 lb Date of Birth:  [birth year]            BSA:          1.892 m Patient Age:    89 years            BP:           170/89 mmHg Patient Gender: F                   HR:           60 bpm. Exam Location:  [facility] Procedure: 2D Echo, Cardiac Doppler and Color Doppler (Both Spectral and Color            Flow Doppler were utilized during procedure). Indications:      Chest Pain R07.9  History:         Patient has prior history of Echocardiogram examinations, most                  recent 01/01/2020. Signs/Symptoms:Chest Pain.  Sonographer:     Ashley McNeely-Sloane Referring Phys:  [id number] XILIN NIU Diagnosing Phys: Keller Paterson IMPRESSIONS  1. Left ventricular ejection fraction, by estimation, is 45 to 50%. The left ventricle has mildly decreased function. The left ventricle demonstrates regional wall motion abnormalities (see scoring diagram/findings for description). There is mild left ventricular hypertrophy. Left ventricular diastolic parameters are consistent with Grade I diastolic dysfunction (impaired relaxation).  2. Right ventricular systolic function is normal. The right ventricular size is normal.  3. Left atrial size was moderately dilated.  4. The mitral valve is normal in structure. Trivial mitral valve regurgitation. Moderate mitral annular calcification.  5. Tricuspid valve regurgitation is mild to moderate.  6. The aortic valve is tricuspid. Aortic valve regurgitation is mild. Aortic valve sclerosis/calcification is present, without any evidence of aortic stenosis.  7. There is moderate dilatation of the ascending aorta, measuring 46 mm.  8. The inferior vena cava is normal in size with greater than 50% respiratory variability, suggesting right atrial pressure of 3 mmHg. FINDINGS  Left Ventricle: Left ventricular ejection fraction, by estimation, is 45 to 50%. The left ventricle has mildly decreased function. The left ventricle demonstrates regional wall motion abnormalities. The left ventricular internal cavity size was normal in size. There is mild left ventricular hypertrophy. Left ventricular diastolic parameters are consistent with Grade I diastolic dysfunction (impaired relaxation).  LV Wall Scoring: The mid and distal anterior septum, apical lateral segment, mid inferoseptal segment, and apex are hypokinetic. The entire anterior wall, antero-lateral wall,  entire inferior wall, posterior wall, basal anteroseptal segment, and basal inferoseptal segment are normal. Right Ventricle: The right ventricular size is normal. No increase in right ventricular wall thickness. Right ventricular systolic function is normal. Left Atrium: Left atrial size was moderately dilated. Right Atrium: Right atrial size was normal in size. Pericardium: There is no evidence of pericardial effusion. Mitral Valve: The mitral valve is normal in structure. Moderate mitral annular calcification. Trivial mitral valve regurgitation. MV peak gradient, 6.2 mmHg. The mean mitral valve gradient is 3.0 mmHg. Tricuspid Valve: The tricuspid valve is normal in structure. Tricuspid valve regurgitation is mild to moderate. Aortic Valve: The aortic valve is  tricuspid. Aortic valve regurgitation is mild. Aortic regurgitation PHT measures 463 msec. Aortic valve sclerosis/calcification is present, without any evidence of aortic stenosis. Aortic valve mean gradient measures 11.0 mmHg. Aortic valve peak gradient measures 19.5 mmHg. Aortic valve area, by VTI measures 2.15 cm. Pulmonic Valve: The pulmonic valve was not well visualized. Pulmonic valve regurgitation is trivial. Aorta: The aortic root is normal in size and structure. There is moderate dilatation of the ascending aorta, measuring 46 mm. Venous: The inferior vena cava is normal in size with greater than 50% respiratory variability, suggesting right atrial pressure of 3 mmHg. IAS/Shunts: The atrial septum is grossly normal.  LEFT VENTRICLE PLAX 2D LVIDd:         5.05 cm     Diastology LVIDs:         2.15 cm     LV e' medial:    5.51 cm/s LV PW:         1.05 cm     LV E/e' medial:  15.8 LV IVS:        1.05 cm     LV e' lateral:   5.76 cm/s LVOT diam:     2.20 cm     LV E/e' lateral: 15.1 LV SV:         105 LV SV Index:   55 LVOT Area:     3.80 cm LV IVRT:       187 msec  LV Volumes (MOD) LV vol d, MOD A2C: 68.1 ml LV vol d, MOD A4C: 81.1 ml LV vol s, MOD  A2C: 41.6 ml LV vol s, MOD A4C: 21.7 ml LV SV MOD A2C:     26.5 ml LV SV MOD A4C:     81.1 ml LV SV MOD BP:      43.6 ml RIGHT VENTRICLE             IVC RV Basal diam:  4.80 cm     IVC diam: 1.90 cm RV Mid diam:    3.40 cm RV S prime:     18.60 cm/s  PULMONARY VEINS TAPSE (M-mode): 3.1 cm      A Reversal Duration: 173.00 msec                             A Reversal Velocity: 28.60 cm/s                             Diastolic Velocity:  30.30 cm/s                             S/D Velocity:        2.10                             Systolic Velocity:   62.90 cm/s LEFT ATRIUM             Index        RIGHT ATRIUM           Index LA diam:        4.30 cm 2.27 cm/m   RA Area:     19.30 cm LA Vol (A2C):   95.7 ml 50.59 ml/m  RA Volume:   55.20 ml  29.18 ml/m LA Vol (A4C):   72.2 ml 38.17 ml/m LA Biplane  Vol: 83.9 ml 44.36 ml/m  AORTIC VALVE                     PULMONIC VALVE AV Area (Vmax):    2.01 cm      PV Vmax:          0.92 m/s AV Area (Vmean):   2.04 cm      PV Vmean:         60.500 cm/s AV Area (VTI):     2.15 cm      PV VTI:           0.212 m AV Vmax:           221.00 cm/s   PV Peak grad:     3.4 mmHg AV Vmean:          151.000 cm/s  PV Mean grad:     2.0 mmHg AV VTI:            0.487 m       PR End Diast Vel: 3.03 msec AV Peak Grad:      19.5 mmHg     RVOT Peak grad:   2 mmHg AV Mean Grad:      11.0 mmHg LVOT Vmax:         117.00 cm/s LVOT Vmean:        80.900 cm/s LVOT VTI:          0.275 m LVOT/AV VTI ratio: 0.56 AI PHT:            463 msec  AORTA Ao Root diam: 3.55 cm Ao Asc diam:  4.60 cm MITRAL VALVE                TRICUSPID VALVE MV Area (PHT): 2.34 cm     TR Peak grad:   23.2 mmHg MV Area VTI:   2.66 cm     TR Mean grad:   16.0 mmHg MV Peak grad:  6.2 mmHg     TR Vmax:        241.00 cm/s MV Mean grad:  3.0 mmHg     TR Vmean:       193.0 cm/s MV Vmax:       1.24 m/s MV Vmean:      76.2 cm/s    SHUNTS MV Decel Time: 324 msec     Systemic VTI:  0.28 m MV E velocity: 87.00 cm/s   Systemic Diam: 2.20 cm MV  A velocity: 117.00 cm/s  Pulmonic VTI:  0.155 m MV E/A ratio:  0.74 Keller Alluri Electronically signed by Keller Paterson Signature Date/Time: 03/11/2024/2:05:01 PM    Final    CT Angio Chest/Abd/Pel for Dissection W and/or Wo Contrast Result Date: 03/10/2024 EXAM: CTA CHEST, ABDOMEN AND PELVIS WITHOUT AND WITH CONTRAST 03/10/2024 05:43:55 PM TECHNIQUE: CTA of the chest was performed without and with the administration of intravenous contrast. CTA of the abdomen and pelvis was performed without and with the administration of intravenous contrast. 100 mL of iohexol  (OMNIPAQUE ) 350 MG/ML injection was administered. Multiplanar reformatted images are provided for review. MIP images are provided for review. Automated exposure control, iterative reconstruction, and/or weight based adjustment of the mA/kV was utilized to reduce the radiation dose to as low as reasonably achievable. COMPARISON: CT chest abdomen and pelvis 11/19/2021, CT abdomen and pelvis 07/27/2020. CLINICAL HISTORY: Acute aortic syndrome (AAS) suspected. FINDINGS: VASCULATURE: AORTA: There is an ascending aortic aneurysm measuring 4.5 x 4.4 cm which has increased in  size compared to the prior study. There are mild calcified atherosclerotic plaques throughout the aorta. No abdominal aortic aneurysm. No evidence for dissection. Calcified atherosclerotic disease present. PULMONARY ARTERIES: There is no central pulmonary embolism. GREAT VESSELS OF AORTIC ARCH: No acute finding. No dissection. No arterial occlusion or significant stenosis. CELIAC TRUNK: There is mild stenosis at the origin of the celiac axis. SUPERIOR MESENTERIC ARTERY: No acute finding. No occlusion or significant stenosis. INFERIOR MESENTERIC ARTERY: There is mild stranding surrounding the proximal aspect of the inferior mesenteric artery. Inferior mesenteric artery otherwise appears within normal limits. RENAL ARTERIES: No acute finding. No occlusion or significant stenosis. ILIAC  ARTERIES: No acute finding. No occlusion or significant stenosis. CHEST: MEDIASTINUM: The heart is mildly enlarged. There are calcified atherosclerotic plaques within the coronary arteries as well. No mediastinal lymphadenopathy. The pericardium demonstrates no acute abnormality. LUNGS AND PLEURA: New scattered pulmonary nodules within the bilateral lower lobes and upper lobes measuring up to 4 mm. There is no new focal lung consolidation, pleural effusion or pneumothorax. THORACIC BONES AND SOFT TISSUES: There are postsurgical changes in both shoulders. ABDOMEN AND PELVIS: LIVER: There is diffuse nodular liver contour compatible with cirrhosis. There are calcified granulomas in the liver. GALLBLADDER AND BILE DUCTS: The gallbladder is surgically absent. No biliary ductal dilatation. SPLEEN: The spleen is unremarkable. PANCREAS: The pancreas is unremarkable. ADRENAL GLANDS: Bilateral adrenal glands demonstrate no acute abnormality. KIDNEYS, URETERS AND BLADDER: There is scarring in the right kidney. Mildly hyperdense area in the posterior left kidney measures 18 mm. There are punctate calculi in both kidneys. No hydronephrosis. No perinephric or periureteral stranding. Urinary bladder is unremarkable. GI AND BOWEL: The appendix is not seen. There is diffuse colonic diverticulosis. Proximal jejunal diverticulum appears unchanged. Stomach and duodenal sweep demonstrate no acute abnormality. There is no bowel obstruction. No abnormal bowel wall thickening or distension. REPRODUCTIVE: The uterus is surgically absent. PERITONEUM AND RETROPERITONEUM: No ascites or free air. LYMPH NODES: No lymphadenopathy. ABDOMINAL BONES AND SOFT TISSUES: No acute abnormality of the bones. No acute soft tissue abnormality. IMPRESSION: 1. Ascending thoracic aortic aneurysm measuring 4.5 x 4.4 cm, increased in size compared to the prior study, without thoracic aortic dissection. Recommend semi-annual imaging follow-up by CTA or MRA and  referral to cardiothoracic surgery if not already obtained. This recommendation follows 2010 accf/aha/aats/ACR/asa/sca/scai/sir/sts/svm guidelines (circulation 2010;121:E266-e369). Aortic aneurysm nos ([id number]). 2. New scattered pulmonary nodules within the bilateral lower lobes and upper lobes measuring up to 4 mm, without focal lung consolidation, pleural effusion, or pneumothorax. 3. Diffuse nodular liver contour compatible with cirrhosis. 4. Mild stenosis at the origin of the celiac axis. 5. Mild stranding surrounding the proximal aspect of the inferior mesenteric artery. Findings may be inflammatory. No evidence for occlusion or dissection. Electronically signed by: Greig Pique MD 03/10/2024 07:01 PM EST RP Workstation: [id number]   DG Chest Port 1 View Result Date: 03/10/2024 CLINICAL DATA:  Chest pain. EXAM: PORTABLE CHEST 1 VIEW COMPARISON:  04/27/2023 FINDINGS: The cardiomediastinal contours are stable allowing for differences in positioning. Aortic atherosclerosis. The lungs are clear. Pulmonary vasculature is normal. No consolidation, pleural effusion, or pneumothorax. No acute osseous abnormalities are seen. Overlying monitoring leads. IMPRESSION: No active disease. Electronically Signed   By: Andrea Gasman M.D.   On: 03/10/2024 17:32     ECHO as above  TELEMETRY (personally reviewed): NSR rate 50s  EKG (personally reviewed): NSR rate 78 bpm, PACs  DATA reviewed by me 03/12/2024: last 24h vitals tele labs imaging I/O, hospitalist  progress note  Principal Problem:   Hypertensive urgency Active Problems:   Hypothyroidism   Thoracoabdominal aortic aneurysm (TAAA)   NSTEMI (non-ST elevated myocardial infarction) (HCC)   CAD (coronary artery disease)   PAF (paroxysmal atrial fibrillation) (HCC)   Overweight (BMI 25.0-29.9)   Thrombocytopenia   Lung nodule    ASSESSMENT AND PLAN: Kaitlyn Oliver is a 89 y.o. female with a past medical history of coronary artery  disease, paroxysmal atrial fibrillation, hypertension, hyperlipidemia who presented to the ED on 03/10/2024 for jaw pain. Cardiology was consulted for further evaluation.   # Atypical chest pain # Jaw pain # Coronary artery disease # Elevated troponin # Paroxysmal atrial fibrillation Patient presented for evaluation of jaw pain that radiates down her neck into her chest and back. Symptoms began ~2 weeks ago, worse after eating. Initial EKG with concern for STE but improved on repeats. Troponins 57 > 171 > 141 > 115.  Echo this admission with EF 45-50%, mid and distal anterior septum, apical lateral, mid inferoseptal wall, and apex hypokinetic mild LVH, grade 1 diastolic dysfunction, moderate MAC, mild AR. - We have asked GI to see the patient today for additional evaluation of atypical symptoms of jaw pain with radiation into neck and chest after eating.  Discussed in detail with Dr. Onita, he will plan for barium swallow, GI cocktail and evaluate if this helps her discomfort at all. - Plan to reassess tomorrow. - Continue IV heparin  at this time.  Discussed with GI that it is okay to hold this if they plan to proceed with EGD. - Continue imdur  120 mg daily, Ranexa  500 mg twice daily. Continue amlodipine  10 mg daily.  -Continue atorvastatin  40 mg daily. -Patient is not on anticoagulation for PAF, has historically refused this.  This patient's case was discussed and created with Dr. Wilburn and he is in agreement.  Signed:  Danita Bloch, PA-C  03/12/2024, 12:20 PM [facility] Card[facility]

## 2024-03-13 ENCOUNTER — Inpatient Hospital Stay

## 2024-03-13 ENCOUNTER — Encounter: Payer: Self-pay | Admitting: Internal Medicine

## 2024-03-13 ENCOUNTER — Other Ambulatory Visit: Payer: Self-pay

## 2024-03-13 ENCOUNTER — Encounter
Admission: EM | Disposition: A | Discharge status: Other Acute Inpatient Hospital | Payer: Self-pay | Source: Home / Self Care | Attending: Hospitalist

## 2024-03-13 ENCOUNTER — Other Ambulatory Visit (HOSPITAL_COMMUNITY): Payer: Self-pay

## 2024-03-13 ENCOUNTER — Telehealth (HOSPITAL_COMMUNITY): Payer: Self-pay

## 2024-03-13 DIAGNOSIS — I469 Cardiac arrest, cause unspecified: Secondary | ICD-10-CM

## 2024-03-13 DIAGNOSIS — J9601 Acute respiratory failure with hypoxia: Secondary | ICD-10-CM

## 2024-03-13 DIAGNOSIS — E876 Hypokalemia: Secondary | ICD-10-CM

## 2024-03-13 DIAGNOSIS — J9690 Respiratory failure, unspecified, unspecified whether with hypoxia or hypercapnia: Secondary | ICD-10-CM

## 2024-03-13 DIAGNOSIS — R739 Hyperglycemia, unspecified: Secondary | ICD-10-CM

## 2024-03-13 DIAGNOSIS — J9602 Acute respiratory failure with hypercapnia: Secondary | ICD-10-CM

## 2024-03-13 DIAGNOSIS — I213 ST elevation (STEMI) myocardial infarction of unspecified site: Secondary | ICD-10-CM

## 2024-03-13 HISTORY — PX: LEFT HEART CATH AND CORONARY ANGIOGRAPHY: CATH118249

## 2024-03-13 HISTORY — PX: CORONARY/GRAFT ACUTE MI REVASCULARIZATION: CATH118305

## 2024-03-13 LAB — BASIC METABOLIC PANEL WITH GFR
Anion gap: 11 (ref 5–15)
Anion gap: 16 — ABNORMAL HIGH (ref 5–15)
Anion gap: 19 — ABNORMAL HIGH (ref 5–15)
BUN: 13 mg/dL (ref 8–23)
BUN: 12 mg/dL (ref 8–23)
BUN: 12 mg/dL (ref 8–23)
CO2: 22 mmol/L (ref 22–32)
CO2: 19 mmol/L — ABNORMAL LOW (ref 22–32)
CO2: 18 mmol/L — ABNORMAL LOW (ref 22–32)
Calcium: 9.7 mg/dL (ref 8.9–10.3)
Calcium: 9.9 mg/dL (ref 8.9–10.3)
Calcium: 9.5 mg/dL (ref 8.9–10.3)
Chloride: 103 mmol/L (ref 98–111)
Chloride: 101 mmol/L (ref 98–111)
Chloride: 97 mmol/L — ABNORMAL LOW (ref 98–111)
Creatinine, Ser: 0.97 mg/dL (ref 0.44–1.00)
Creatinine, Ser: 1.1 mg/dL — ABNORMAL HIGH (ref 0.44–1.00)
Creatinine, Ser: 1.07 mg/dL — ABNORMAL HIGH (ref 0.44–1.00)
GFR, Estimated: 56 mL/min — ABNORMAL LOW
GFR, Estimated: 48 mL/min — ABNORMAL LOW
GFR, Estimated: 50 mL/min — ABNORMAL LOW
Glucose, Bld: 94 mg/dL (ref 70–99)
Glucose, Bld: 174 mg/dL — ABNORMAL HIGH (ref 70–99)
Glucose, Bld: 287 mg/dL — ABNORMAL HIGH (ref 70–99)
Potassium: 3.7 mmol/L (ref 3.5–5.1)
Potassium: 2.7 mmol/L — CL (ref 3.5–5.1)
Potassium: 3.4 mmol/L — ABNORMAL LOW (ref 3.5–5.1)
Sodium: 136 mmol/L (ref 135–145)
Sodium: 136 mmol/L (ref 135–145)
Sodium: 133 mmol/L — ABNORMAL LOW (ref 135–145)

## 2024-03-13 LAB — BLOOD GAS, ARTERIAL
Acid-base deficit: 11 mmol/L — ABNORMAL HIGH (ref 0.0–2.0)
Bicarbonate: 18.6 mmol/L — ABNORMAL LOW (ref 20.0–28.0)
FIO2: 60 %
MECHVT: 400 mL
Mechanical Rate: 15
O2 Saturation: 98.5 %
PEEP: 8 cmH2O
Patient temperature: 37
pCO2 arterial: 56 mmHg — ABNORMAL HIGH (ref 32–48)
pH, Arterial: 7.13 — CL (ref 7.35–7.45)
pO2, Arterial: 99 mmHg (ref 83–108)

## 2024-03-13 LAB — CBC
HCT: 33.4 % — ABNORMAL LOW (ref 36.0–46.0)
HCT: 38 % (ref 36.0–46.0)
Hemoglobin: 11.1 g/dL — ABNORMAL LOW (ref 12.0–15.0)
Hemoglobin: 12.3 g/dL (ref 12.0–15.0)
MCH: 30.2 pg (ref 26.0–34.0)
MCH: 30.2 pg (ref 26.0–34.0)
MCHC: 33.2 g/dL (ref 30.0–36.0)
MCHC: 32.4 g/dL (ref 30.0–36.0)
MCV: 91 fL (ref 80.0–100.0)
MCV: 93.4 fL (ref 80.0–100.0)
Platelets: 119 K/uL — ABNORMAL LOW (ref 150–400)
Platelets: 206 K/uL (ref 150–400)
RBC: 3.67 MIL/uL — ABNORMAL LOW (ref 3.87–5.11)
RBC: 4.07 MIL/uL (ref 3.87–5.11)
RDW: 15.6 % — ABNORMAL HIGH (ref 11.5–15.5)
RDW: 15.6 % — ABNORMAL HIGH (ref 11.5–15.5)
WBC: 6.4 K/uL (ref 4.0–10.5)
WBC: 16.2 K/uL — ABNORMAL HIGH (ref 4.0–10.5)
nRBC: 0 % (ref 0.0–0.2)
nRBC: 0 % (ref 0.0–0.2)

## 2024-03-13 LAB — POCT ACTIVATED CLOTTING TIME
Activated Clotting Time: 250 s
Activated Clotting Time: 271 s

## 2024-03-13 LAB — PROTIME-INR
INR: 1.1 (ref 0.8–1.2)
Prothrombin Time: 14.9 s (ref 11.4–15.2)

## 2024-03-13 LAB — COOXEMETRY PANEL
Carboxyhemoglobin: 1.7 % — ABNORMAL HIGH (ref 0.5–1.5)
Methemoglobin: 0.8 % (ref 0.0–1.5)
O2 Saturation: 95 %
Total hemoglobin: 12.7 g/dL (ref 12.0–16.0)
Total oxygen content: 92.5 %

## 2024-03-13 LAB — TROPONIN T, HIGH SENSITIVITY: Troponin T High Sensitivity: 78 ng/L — ABNORMAL HIGH (ref 0–19)

## 2024-03-13 LAB — LACTIC ACID, PLASMA
Lactic Acid, Venous: 4.2 mmol/L (ref 0.5–1.9)
Lactic Acid, Venous: 4.2 mmol/L (ref 0.5–1.9)

## 2024-03-13 LAB — GLUCOSE, CAPILLARY: Glucose-Capillary: 136 mg/dL — ABNORMAL HIGH (ref 70–99)

## 2024-03-13 LAB — CARDIAC CATHETERIZATION: Cath EF Quantitative: 30 %

## 2024-03-13 LAB — MAGNESIUM: Magnesium: 2.2 mg/dL (ref 1.7–2.4)

## 2024-03-13 LAB — HEPARIN LEVEL (UNFRACTIONATED): Heparin Unfractionated: 0.58 [IU]/mL (ref 0.30–0.70)

## 2024-03-13 SURGERY — CORONARY/GRAFT ACUTE MI REVASCULARIZATION
Anesthesia: Moderate Sedation

## 2024-03-13 MED ORDER — NOREPINEPHRINE 16 MG/250ML-% IV SOLN
0.0000 ug/min | INTRAVENOUS | Status: DC
  Administered 2024-03-13: 15 ug/min via INTRAVENOUS
  Filled 2024-03-13: qty 250

## 2024-03-13 MED ORDER — RANOLAZINE ER 500 MG PO TB12: 500.0000 mg | ORAL_TABLET | Freq: Two times a day (BID) | ORAL | Status: AC

## 2024-03-13 MED ORDER — POTASSIUM CHLORIDE 10 MEQ/100ML IV SOLN
10.0000 meq | INTRAVENOUS | Status: DC
  Administered 2024-03-13 (×2): 10 meq via INTRAVENOUS
  Filled 2024-03-13 (×6): qty 100

## 2024-03-13 MED ORDER — SODIUM CHLORIDE 0.9 % IV SOLN: 250.0000 mL | INTRAVENOUS | Status: AC

## 2024-03-13 MED ORDER — ENALAPRILAT 1.25 MG/ML IV SOLN: 0.6250 mg | INTRAVENOUS | Status: AC | PRN

## 2024-03-13 MED ORDER — AMIODARONE HCL IN DEXTROSE 360-4.14 MG/200ML-% IV SOLN
60.0000 mg/h | INTRAVENOUS | Status: DC
  Administered 2024-03-13: 60 mg/h via INTRAVENOUS
  Filled 2024-03-13: qty 200

## 2024-03-13 MED ORDER — FENTANYL CITRATE (PF) 50 MCG/ML IJ SOSY
50.0000 ug | PREFILLED_SYRINGE | INTRAMUSCULAR | Status: DC | PRN
  Administered 2024-03-13 (×2): 50 ug via INTRAVENOUS
  Administered 2024-03-13: 200 ug via INTRAVENOUS

## 2024-03-13 MED ORDER — TICAGRELOR 90 MG PO TABS: 90.0000 mg | ORAL_TABLET | Freq: Two times a day (BID) | ORAL | Status: AC

## 2024-03-13 MED ORDER — PANTOPRAZOLE SODIUM 40 MG IV SOLR: 40.0000 mg | Freq: Two times a day (BID) | INTRAVENOUS | Status: AC

## 2024-03-13 MED ORDER — MIDAZOLAM-SODIUM CHLORIDE 100-0.9 MG/100ML-% IV SOLN: 0.5000 mg/h | INTRAVENOUS | Status: AC

## 2024-03-13 MED ORDER — MIDAZOLAM-SODIUM CHLORIDE 100-0.9 MG/100ML-% IV SOLN
0.5000 mg/h | INTRAVENOUS | Status: DC
  Administered 2024-03-13: 0.5 mg/h via INTRAVENOUS
  Filled 2024-03-13: qty 100

## 2024-03-13 MED ORDER — AMIODARONE HCL IN DEXTROSE 360-4.14 MG/200ML-% IV SOLN: 60.0000 mg/h | INTRAVENOUS | Status: AC

## 2024-03-13 MED ORDER — FENTANYL CITRATE (PF) 50 MCG/ML IJ SOSY: 50.0000 ug | PREFILLED_SYRINGE | INTRAMUSCULAR | Status: AC | PRN

## 2024-03-13 MED ORDER — FENTANYL CITRATE (PF) 50 MCG/ML IJ SOSY: 50.0000 ug | PREFILLED_SYRINGE | INTRAMUSCULAR | Status: DC | PRN

## 2024-03-13 MED ORDER — ACETAMINOPHEN 325 MG PO TABS: 650.0000 mg | ORAL_TABLET | Freq: Four times a day (QID) | ORAL | Status: AC | PRN

## 2024-03-13 MED ORDER — LIDOCAINE HCL 1 % IJ SOLN
INTRAMUSCULAR | Status: AC
  Filled 2024-03-13: qty 20

## 2024-03-13 MED ORDER — CANGRELOR BOLUS VIA INFUSION
INTRAVENOUS | Status: DC | PRN
  Administered 2024-03-13: 2448 ug via INTRAVENOUS

## 2024-03-13 MED ORDER — POLYETHYLENE GLYCOL 3350 17 G PO PACK: 17.0000 g | PACK | Freq: Every day | ORAL | Status: DC

## 2024-03-13 MED ORDER — HEPARIN SODIUM (PORCINE) 1000 UNIT/ML IJ SOLN
INTRAMUSCULAR | Status: DC | PRN
  Administered 2024-03-13: 4000 [IU] via INTRAVENOUS
  Administered 2024-03-13: 3000 [IU] via INTRAVENOUS
  Administered 2024-03-13: 8000 [IU] via INTRAVENOUS

## 2024-03-13 MED ORDER — NOREPINEPHRINE 16 MG/250ML-% IV SOLN: 0.0000 ug/min | INTRAVENOUS | Status: AC

## 2024-03-13 MED ORDER — METHYLPREDNISOLONE SODIUM SUCC 40 MG IJ SOLR
40.0000 mg | Freq: Once | INTRAMUSCULAR | Status: AC
  Administered 2024-03-13: 40 mg via INTRAVENOUS
  Filled 2024-03-13: qty 1

## 2024-03-13 MED ORDER — DOCUSATE SODIUM 50 MG/5ML PO LIQD: 100.0000 mg | Freq: Two times a day (BID) | ORAL | Status: DC

## 2024-03-13 MED ORDER — SODIUM CHLORIDE 0.9 % IV SOLN
INTRAVENOUS | Status: AC | PRN
  Administered 2024-03-13: 4 ug/kg/min via INTRAVENOUS

## 2024-03-13 MED ORDER — ROCURONIUM BROMIDE 10 MG/ML (PF) SYRINGE
PREFILLED_SYRINGE | INTRAVENOUS | Status: AC
  Administered 2024-03-13: 50 mg
  Filled 2024-03-13: qty 10

## 2024-03-13 MED ORDER — NOREPINEPHRINE 4 MG/250ML-% IV SOLN: 0.0000 ug/min | INTRAVENOUS | Status: AC

## 2024-03-13 MED ORDER — ETOMIDATE 2 MG/ML IV SOLN
INTRAVENOUS | Status: AC
  Administered 2024-03-13: 20 mg
  Filled 2024-03-13: qty 10

## 2024-03-13 MED ORDER — IOHEXOL 300 MG/ML  SOLN
INTRAMUSCULAR | Status: DC | PRN
  Administered 2024-03-13: 135 mL

## 2024-03-13 MED ORDER — TICAGRELOR 90 MG PO TABS: 90.0000 mg | ORAL_TABLET | Freq: Two times a day (BID) | ORAL | Status: DC

## 2024-03-13 MED ORDER — AMIODARONE HCL IN DEXTROSE 360-4.14 MG/200ML-% IV SOLN
INTRAVENOUS | Status: AC
  Filled 2024-03-13: qty 200

## 2024-03-13 MED ORDER — VERAPAMIL HCL 2.5 MG/ML IV SOLN
INTRAVENOUS | Status: DC | PRN
  Administered 2024-03-13: 2.5 mg via INTRA_ARTERIAL

## 2024-03-13 MED ORDER — TIROFIBAN HCL IV 12.5 MG/250 ML
INTRAVENOUS | Status: AC
  Filled 2024-03-13: qty 250

## 2024-03-13 MED ORDER — SODIUM CHLORIDE 0.9% FLUSH: 10.0000 mL | INTRAVENOUS | Status: DC | PRN

## 2024-03-13 MED ORDER — PROPOFOL 1000 MG/100ML IV EMUL: 0.0000 ug/kg/min | INTRAVENOUS | Status: AC

## 2024-03-13 MED ORDER — PROPOFOL 1000 MG/100ML IV EMUL
0.0000 ug/kg/min | INTRAVENOUS | Status: DC
  Administered 2024-03-13 (×2): 10 ug/kg/min via INTRAVENOUS

## 2024-03-13 MED ORDER — HEPARIN SODIUM (PORCINE) 1000 UNIT/ML IJ SOLN
INTRAMUSCULAR | Status: AC
  Filled 2024-03-13: qty 10

## 2024-03-13 MED ORDER — SODIUM CHLORIDE 0.9 % IV SOLN: 250.0000 mL | INTRAVENOUS | Status: DC

## 2024-03-13 MED ORDER — POTASSIUM CHLORIDE 10 MEQ/100ML IV SOLN: INTRAVENOUS | Status: AC | PRN

## 2024-03-13 MED ORDER — FENTANYL BOLUS VIA INFUSION: 25.0000 ug | INTRAVENOUS | Status: AC | PRN

## 2024-03-13 MED ORDER — HEPARIN (PORCINE) IN NACL 1000-0.9 UT/500ML-% IV SOLN
INTRAVENOUS | Status: AC
  Filled 2024-03-13: qty 1000

## 2024-03-13 MED ORDER — FENTANYL CITRATE (PF) 100 MCG/2ML IJ SOLN
INTRAMUSCULAR | Status: AC
  Filled 2024-03-13: qty 2

## 2024-03-13 MED ORDER — FUROSEMIDE 10 MG/ML IJ SOLN
INTRAMUSCULAR | Status: AC
  Filled 2024-03-13: qty 4

## 2024-03-13 MED ORDER — ONDANSETRON HCL 4 MG/2ML IJ SOLN: 4.0000 mg | Freq: Three times a day (TID) | INTRAMUSCULAR | Status: AC | PRN

## 2024-03-13 MED ORDER — SODIUM CHLORIDE 0.9% FLUSH: 10.0000 mL | Freq: Two times a day (BID) | INTRAVENOUS | Status: AC

## 2024-03-13 MED ORDER — ATORVASTATIN CALCIUM 40 MG PO TABS: 40.0000 mg | ORAL_TABLET | Freq: Every day | ORAL | Status: AC

## 2024-03-13 MED ORDER — DOCUSATE SODIUM 50 MG/5ML PO LIQD: 100.0000 mg | Freq: Two times a day (BID) | ORAL | Status: AC

## 2024-03-13 MED ORDER — MIDAZOLAM HCL 2 MG/2ML IJ SOLN
INTRAMUSCULAR | Status: AC
  Filled 2024-03-13: qty 2

## 2024-03-13 MED ORDER — LEVOTHYROXINE SODIUM 50 MCG PO TABS: 50.0000 ug | ORAL_TABLET | Freq: Every day | ORAL | Status: AC

## 2024-03-13 MED ORDER — KCL IN DEXTROSE-NACL 10-5-0.45 MEQ/L-%-% IV SOLN
INTRAVENOUS | Status: DC
  Filled 2024-03-13: qty 1000

## 2024-03-13 MED ORDER — NOREPINEPHRINE 4 MG/250ML-% IV SOLN
0.0000 ug/min | INTRAVENOUS | Status: DC
  Administered 2024-03-13: 5 ug/min via INTRAVENOUS

## 2024-03-13 MED ORDER — HEPARIN (PORCINE) 25000 UT/250ML-% IV SOLN
1250.0000 [IU]/h | INTRAVENOUS | Status: DC
  Administered 2024-03-13: 1250 [IU]/h via INTRAVENOUS

## 2024-03-13 MED ORDER — VASOPRESSIN 20 UNITS/100 ML INFUSION FOR SHOCK: 0.0000 [IU]/min | INTRAVENOUS | Status: AC

## 2024-03-13 MED ORDER — AMLODIPINE BESYLATE 10 MG PO TABS: 10.0000 mg | ORAL_TABLET | Freq: Every day | ORAL | Status: AC

## 2024-03-13 MED ORDER — SODIUM CHLORIDE 0.9% FLUSH: 10.0000 mL | INTRAVENOUS | Status: AC | PRN

## 2024-03-13 MED ORDER — FENTANYL 2500MCG IN NS 250ML (10MCG/ML) PREMIX INFUSION
0.0000 ug/h | INTRAVENOUS | Status: DC
  Administered 2024-03-13: 50 ug/h via INTRAVENOUS
  Filled 2024-03-13: qty 250

## 2024-03-13 MED ORDER — SODIUM CHLORIDE 0.9% FLUSH: 10.0000 mL | Freq: Two times a day (BID) | INTRAVENOUS | Status: DC

## 2024-03-13 MED ORDER — SODIUM CHLORIDE 0.9 % IV SOLN
0.7500 ug/kg/min | INTRAVENOUS | Status: DC
  Administered 2024-03-13: 0.75 ug/kg/min via INTRAVENOUS
  Filled 2024-03-13: qty 50

## 2024-03-13 MED ORDER — FENTANYL CITRATE (PF) 100 MCG/2ML IJ SOLN
INTRAMUSCULAR | Status: AC
  Administered 2024-03-13: 100 ug
  Filled 2024-03-13: qty 2

## 2024-03-13 MED ORDER — LIDOCAINE HCL (PF) 1 % IJ SOLN: INTRAMUSCULAR | Status: DC | PRN

## 2024-03-13 MED ORDER — FENTANYL BOLUS VIA INFUSION: 25.0000 ug | INTRAVENOUS | Status: DC | PRN

## 2024-03-13 MED ORDER — POLYETHYLENE GLYCOL 3350 17 G PO PACK: 17.0000 g | PACK | Freq: Every day | ORAL | Status: AC

## 2024-03-13 MED ORDER — MIDAZOLAM HCL (PF) 2 MG/2ML IJ SOLN
2.0000 mg | Freq: Once | INTRAMUSCULAR | Status: AC
  Administered 2024-03-13: 2 mg via INTRAVENOUS
  Filled 2024-03-13: qty 2

## 2024-03-13 MED ORDER — CANGRELOR TETRASODIUM 50 MG IV SOLR
INTRAVENOUS | Status: AC
  Filled 2024-03-13: qty 50

## 2024-03-13 MED ORDER — FUROSEMIDE 10 MG/ML IJ SOLN
INTRAMUSCULAR | Status: DC | PRN
  Administered 2024-03-13: 40 mg via INTRAVENOUS

## 2024-03-13 MED ORDER — FENTANYL 2500MCG IN NS 250ML (10MCG/ML) PREMIX INFUSION: 0.0000 ug/h | INTRAVENOUS | Status: AC

## 2024-03-13 MED ORDER — VASOPRESSIN 20 UNITS/100 ML INFUSION FOR SHOCK
0.0000 [IU]/min | INTRAVENOUS | Status: DC
  Administered 2024-03-13: 0.03 [IU]/min via INTRAVENOUS
  Filled 2024-03-13: qty 100

## 2024-03-13 MED ORDER — HEPARIN (PORCINE) IN NACL 1000-0.9 UT/500ML-% IV SOLN
INTRAVENOUS | Status: DC | PRN
  Administered 2024-03-13: 1000 mL

## 2024-03-13 MED ORDER — VERAPAMIL HCL 2.5 MG/ML IV SOLN
INTRAVENOUS | Status: AC
  Filled 2024-03-13: qty 2

## 2024-03-13 SURGICAL SUPPLY — 19 items
BALLOON EUPHORA RX 2.0X12 (BALLOONS) IMPLANT
BALLOON MINITREK RX 1.5X15 (BALLOONS) IMPLANT
BALLOON MINITREK RX 2.0X12 (BALLOONS) IMPLANT
BALLOON TAKERU 1.5X12 (BALLOONS) IMPLANT
CATH INFINITI JR4 5F (CATHETERS) IMPLANT
CATH VISTA GUIDE 6FR XB3.5 EPK (CATHETERS) IMPLANT
DEVICE RAD TR BAND REGULAR (VASCULAR PRODUCTS) IMPLANT
DRAPE BRACHIAL (DRAPES) IMPLANT
GLIDESHEATH SLEND SS 6F .021 (SHEATH) IMPLANT
GUIDEWIRE INQWIRE 1.5J.035X260 (WIRE) IMPLANT
KIT ENCORE 26 ADVANTAGE (KITS) IMPLANT
NEEDLE PERC 18GX7CM (NEEDLE) IMPLANT
PACK CARDIAC CATH (CUSTOM PROCEDURE TRAY) ×1 IMPLANT
SET ATX-X65L (MISCELLANEOUS) IMPLANT
SHEATH AVANTI 6FR X 11CM (SHEATH) IMPLANT
STATION PROTECTION PRESSURIZED (MISCELLANEOUS) IMPLANT
TUBING CIL FLEX 10 FLL-RA (TUBING) IMPLANT
WIRE G HI TQ BMW 190 (WIRE) IMPLANT
WIRE GUIDERIGHT .035X150 (WIRE) IMPLANT

## 2024-03-13 NOTE — Progress Notes (Signed)
" °   03/13/24 1600  Spiritual Encounters  Type of Visit Initial  Care provided to: Family  Referral source Chaplain team  OnCall Visit No  Interventions  Spiritual Care Interventions Made Compassionate presence  Intervention Outcomes  Outcomes Connection to spiritual care   Chaplain provided compassionate presence to the family. Family was in waiting room of the ICU. They said they were waiting for patient to be placed in room. The family said medical staff was trying to get patient stable. Once stable they can see patient. "

## 2024-03-13 NOTE — Progress Notes (Signed)
" °   03/13/24 1335  Hand-off documentation  Hand-off Given Given to Transfer Unit/facility  Report given to (Full Name) Curlee RN  Report received from (Full Name) Naomie Aly RN  Check in  Check in process completed (see row info) Yes   Patient transferred to ICU "

## 2024-03-13 NOTE — Progress Notes (Signed)
 [facility] CARDIOLOGY PROGRESS NOTE   Patient ID: Kaitlyn Oliver MRN: [id number] DOB/AGE: [birth year] 89 y.o.  Admit date: 03/10/2024 Referring Physician Dr. Waylon Cassis Primary Physician Salli Amato, MD  Primary Cardiologist Dr. Ammon Reason for Consultation atypical chest pain  HPI: Kaitlyn Oliver is a 89 y.o. female with a past medical history of coronary artery disease, paroxysmal atrial fibrillation, hypertension, hyperlipidemia who presented to the ED on 03/10/2024 for jaw pain. Cardiology was consulted for further evaluation.   Interval History:  -Patient seen and examined this AM, resting in bed with daughter at bedside.  - Stated that she was able to tolerate her dinner last night without any issues, currently awaiting breakfast.  Denied any chest pain at the time of my evaluation this morning. - Rapid response was called around lunchtime after what was initially thought to be an allergic reaction to viscous lidocaine .  Patient was evaluated by myself and Dr. Katheryn after this, she did report some chest pain at that time so EKG was ordered and was noted to have ST elevation.  Shortly after EKG was obtained and Cath Lab was contacted regarding STEMI, patient became unresponsive and pulses were lost.  She received short round of ACLS with compressions started immediately, 1 dose of epi given and shocked x 1.  Regained pulses with what appeared to be ventricular tachycardia, was loaded with 300 mg of IV amiodarone.  Transferred to the ICU and was intubated.  Then was taken to the Cath Lab for emergent cardiac catheterization.  Review of systems complete and found to be negative unless listed above   Vitals:   03/13/24 0736 03/13/24 1232 03/13/24 1247 03/13/24 1326  BP: 130/74 110/72 (!) 146/86 131/83  Pulse: 61 (!) 59 62   Resp:      Temp: 98.1 F (36.7 C)     TempSrc: Oral     SpO2: 96% 94% 96%   Weight:      Height:         Intake/Output Summary  (Last 24 hours) at 03/13/2024 1422 Last data filed at 03/13/2024 0736 Gross per 24 hour  Intake 240 ml  Output --  Net 240 ml     PHYSICAL EXAM General: Ill appearing elderly female, well nourished, in no acute distress. HEENT: Normocephalic and atraumatic. Neck: No JVD.  Lungs: Normal respiratory effort on room air. Clear bilaterally to auscultation. No wheezes, crackles, rhonchi.  Heart: HRRR. Normal S1 and S2 without gallops or murmurs. Radial & DP pulses 2+ bilaterally. Abdomen: Non-distended appearing.  Msk: Normal strength and tone for age. Extremities: No clubbing, cyanosis or edema.    LABS: Basic Metabolic Panel: Recent Labs    03/13/24 0405 03/13/24 1324  NA 136 136  K 3.7 2.7*  CL 103 101  CO2 22 19*  GLUCOSE 94 174*  BUN 13 12  CREATININE 0.97 1.10*  CALCIUM  9.7 9.9  MG  --  2.2   Liver Function Tests: No results for input(s): AST, ALT, ALKPHOS, BILITOT, PROT, ALBUMIN in the last 72 hours. No results for input(s): LIPASE, AMYLASE in the last 72 hours. CBC: Recent Labs    03/12/24 0159 03/13/24 0405  WBC 5.4 6.4  HGB 11.3* 11.1*  HCT 33.5* 33.4*  MCV 89.3 91.0  PLT 135* 119*   Cardiac Enzymes: No results for input(s): CKTOTAL, CKMB, CKMBINDEX, TROPONINIHS in the last 72 hours. BNP: No results for input(s): BNP in the last 72 hours. D-Dimer: No results for input(s): DDIMER in the  last 72 hours. Hemoglobin A1C: Recent Labs    03/10/24 1636  HGBA1C 5.5   Fasting Lipid Panel: Recent Labs    03/11/24 0319  CHOL 188  HDL 27*  LDLCALC 90  TRIG 642*  CHOLHDL 7.0   Thyroid  Function Tests: No results for input(s): TSH, T4TOTAL, T3FREE, THYROIDAB in the last 72 hours.  Invalid input(s): FREET3 Anemia Panel: No results for input(s): VITAMINB12, FOLATE, FERRITIN, TIBC, IRON, RETICCTPCT in the last 72 hours.  CT SOFT TISSUE NECK W CONTRAST Result Date: 03/13/2024 EXAM: CT NECK WITH CONTRAST  03/12/2024 03:47:40 PM TECHNIQUE: CT of the neck was performed with the administration of intravenous contrast. Multiplanar reformatted images are provided for review. Automated exposure control, iterative reconstruction, and/or weight based adjustment of the mA/kV was utilized to reduce the radiation dose to as low as reasonably achievable. CONTRAST: 75 mL of Omnipaque  300. COMPARISON: None available. CLINICAL HISTORY: Bilateral neck pain after recent tooth extractions. FINDINGS: AERODIGESTIVE TRACT: No discrete mass. No edema. SALIVARY GLANDS: The parotid and submandibular glands are unremarkable. THYROID : Unremarkable. LYMPH NODES: No suspicious cervical lymphadenopathy. SOFT TISSUES: There is an ovoid soft tissue nodule in the right supraclavicular region posterior to the Sternocleidomastoid muscle, measuring approximately 2.9 x 1.7 x 1.9 cm, with Hounsfield units of 21. BONES: The patient is edentulous. There are lucencies present anteriorly within the mandible on the right, compatible with recent extractions of the Lateral Incisor and Canine. There are no adjacent inflammatory changes present. There are degenerative changes present throughout the Cervical Spine. OTHER: Visualized sinuses and mastoid air cells are well aerated. Mild emphysematous changes are noted within the lung apices. The patient is status post bilateral lens replacement. IMPRESSION: 1. Ovoid low-attenuation soft tissue nodule in the right supraclavicular region posterior to the sternocleidomastoid muscle, measuring approximately 2.9 x 1.7 x 1.9 cm (HU ~21). Follow up ultrasound of the neck is suggested. 2. Recent extractions of the right lateral incisor and canine without adjacent inflammatory change. 3. Mild emphysematous change in the lung apices. Electronically signed by: Evalene Coho MD 03/13/2024 04:28 AM EST RP Workstation: [id number]   DG ESOPHAGUS W SINGLE CM (SOL OR THIN BA) Result Date: 03/12/2024 CLINICAL DATA:   89 year old female. Endorsing jaw and chest pain with eating. Team is requesting esophagram for further evaluation. EXAM: ESOPHAGUS/BARIUM SWALLOW/TABLET STUDY TECHNIQUE: Single contrast examination was performed using thin liquid barium. This exam was performed by Delon Beagle NP, and was supervised and interpreted by Dr. Wilkie Lent. FLUOROSCOPY: Radiation Exposure Index (as provided by the fluoroscopic device): 5.90 mGy Kerma COMPARISON:  None Available. FINDINGS: Swallowing: Appears normal. No vestibular penetration or aspiration seen. 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 Esophageal motility: Nonspecific esophageal dysmotility. Hiatal Hernia: Small hiatal hernia Gastroesophageal reflux: None visualized. Ingested 13mm barium tablet: Not given.  Patient declined Other: None. 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 Nonspecific esophageal dysmotility. Small hiatal hernia. No masses or mucosal lesions. Electronically Signed   By: Wilkie Lent M.D.   On: 03/12/2024 16:26     ECHO as above  TELEMETRY (personally reviewed): NSR rate 60s  EKG (personally reviewed): NSR rate 78 bpm, PACs  DATA reviewed by me 03/13/2024: last 24h vitals tele labs imaging I/O, hospitalist progress note  Principal Problem:   Hypertensive urgency Active Problems:   Hypothyroidism   Thoracoabdominal aortic aneurysm (TAAA)   NSTEMI (non-ST elevated myocardial infarction) (HCC)   CAD (coronary artery disease)   PAF (paroxysmal atrial  fibrillation) (HCC)   Overweight (BMI 25.0-29.9)   Thrombocytopenia   Lung nodule    ASSESSMENT AND PLAN: Kaitlyn Oliver is a 89 y.o. female with a past medical history of coronary artery disease, paroxysmal atrial fibrillation, hypertension, hyperlipidemia who presented to the ED on 03/10/2024 for jaw pain. Cardiology was consulted for further  evaluation.   # STEMI # Jaw pain # Coronary artery disease # Elevated troponin # Paroxysmal atrial fibrillation Patient presented for evaluation of jaw pain that radiates down her neck into her chest and back. Symptoms began ~2 weeks ago, worse after eating. Initial EKG with concern for STE but improved on repeats. Troponins 57 > 171 > 141 > 115.  Echo this admission with EF 45-50%, mid and distal anterior septum, apical lateral, mid inferoseptal wall, and apex hypokinetic mild LVH, grade 1 diastolic dysfunction, moderate MAC, mild AR. - Patient currently undergoing emergent cardiac catheterization for STEMI. - Continue IV amiodarone. - Continue IV heparin . - Continue imdur  120 mg daily, Ranexa  500 mg twice daily. Continue amlodipine  10 mg daily.  -Continue atorvastatin  40 mg daily. -Patient is not on anticoagulation for PAF, has historically refused this.  This patient's case was discussed and created with Dr. Wilburn and he is in agreement.  Signed:  Danita Bloch, PA-C  03/13/2024, 2:22 PM [facility]

## 2024-03-13 NOTE — Telephone Encounter (Signed)
 Pharmacy Patient Advocate Encounter  Insurance verification completed.    The patient is insured through [employer]. Patient has Medicare and is not eligible for a copay card, but may be able to apply for patient assistance or Medicare RX Payment Plan (Patient Must reach out to their plan, if eligible for payment plan), if available.    Ran test claim for Generic Brilinta 90mg  tablet and the current 30 day co-pay is $95.05.   This test claim was processed through [facility]- copay amounts may vary at other pharmacies due to [employer], or as the patient moves through the different stages of their insurance plan.

## 2024-03-13 NOTE — Consult Note (Signed)
 "  NAME:  Kaitlyn Oliver, MRN:  [id number], DOB:  May 29, 1933, LOS: 2 ADMISSION DATE:  03/10/2024, CONSULTATION DATE:  03/13/2024 REFERRING MD:  Dr Awanda, CHIEF COMPLAINT:  V.Fib Cardiac Arrest   Brief Pt Description / Synopsis:  89 y.o. female admitted with Hypertensive Urgency and NSTEMI.  Course complicated by anterior STEMI and subsequent V.Fib Cardiac Arrest (approximately 2-3 minutes of ACLS).  Emergent Cath showed 100% stenosed mid LAD lesion but do to heavy calcification was only able to be intervened with balloon angioplasty.  Pending transfer to [facility].   History of Present Illness:  Kaitlyn Oliver is a 89 y.o. 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 (pt refused it) who presented to [facility] ED on 03/10/24 with complaints of chest pain.   Patient is currently intubated, therefore history is take from H&P and chart review.  Patient reported that she has intermittent chest pain for more than 10 days which has worsened today.  The chest pain is located substernal area, constant, initially 10 out of 10 in severity, currently subsided, radiating to the neck.  No cough and SOB.  No fever or chills.  No nausea, vomiting, diarrhea or abdominal pain.  No symptoms UTI.  Patient denies any rectal bleeding or dark stool.  No recent fall or head injury.   ED Course: Initial Vital Signs: elevated blood pressure 214/114 which improved to 169/93 after giving labetalol  5 mg twice in ED. temperature normal, heart rate 58, RR 20, oxygen  saturation 91-94% on room air.   Her chest pain has also subsided. EKG showed ST elevation in V2. Code STEMI was consulted.  Dr. Florencio evaluated the patient, who does not think pt meets criteria for STEMI.   Significant Labs: Troponin 57 -->  171,   WBC 5.8, GFR> 60  Imaging Chest X-ray>>negative  CTA Chest/Abdomen/Pelvis>>1. Ascending thoracic aortic aneurysm measuring 4.5 x 4.4 cm, increased in size compared to the prior study, without thoracic aortic dissection. Recommend semi-annual imaging follow-up by CTA or MRA and referral to cardiothoracic surgery if not already obtained. This recommendation follows 2010 accf/aha/aats/ACR/asa/sca/scai/sir/sts/svm guidelines (circulation 2010;121:E266-e369). Aortic aneurysm nos ([id number]). 2. New scattered pulmonary nodules within the bilateral lower lobes and upper lobes measuring up to 4 mm, without focal lung consolidation, pleural effusion, or pneumothorax. 3. Diffuse nodular liver contour compatible with cirrhosis. 4. Mild stenosis at the origin of the celiac axis. 5. Mild stranding surrounding the proximal aspect of the inferior mesenteric artery. Findings may be inflammatory. No evidence for occlusion or dissection  Her chest pain has also subsided. EKG showed ST elevation in V2. Code STEMI was consulted.  Dr. Florencio evaluated the patient, who does not think pt meets criteria for STEMI.    Hospitalist asked to admit for further workup and treatment.  Please see Significant Hospital Events section below for full detailed hospital course.   Pertinent  Medical History   Past Medical History:  Diagnosis Date   Anxiety    Arthritis    thumbs, hands   Atrial fibrillation (HCC)    Cancer (HCC) 1991   bilateral breast   Depression    GERD (gastroesophageal reflux disease)    Headache    occasional - pinched nerve in neck   Hypertension    Hypothyroidism    Thyroid  disease    Wears dentures  full upper, partial lower    Micro Data:  N/A  Antimicrobials:   Anti-infectives (From admission, onward)    None       Significant Hospital Events: Including procedures, antibiotic start and stop dates in addition to other pertinent events   12/28: Admitted by  Hospitalist for hypertensive urgency and NSTEMI. 12/29: Cardiology consulted. 12/30:  Symptoms felt to be very atypical and associated with solid food intake, GI consulted to rule out esophageal issues contributing to symptoms. 12/31: Developed chest pain and diaphoresis, EKG with STEMI, suffered brief V. Fib cardiac arrest requiring about 2-3 minutes of ACLS.  Transferred to ICU, intubated. PCCM consulted.  Taken for emergent Cath, only able to balloon lesion.  Now with shock, critically ill. Pending transfer to [facility].  Interim History / Subjective:  As outlined above under Significant Hospital Events section  Objective   Blood pressure (!) 146/86, pulse 62, temperature 98.1 F (36.7 C), temperature source Oral, resp. rate 20, height 5' 5 (1.651 m), weight 81.6 kg, SpO2 96%.        Intake/Output Summary (Last 24 hours) at 03/13/2024 1326 Last data filed at 03/13/2024 9263 Gross per 24 hour  Intake 240 ml  Output --  Net 240 ml   Filed Weights   03/10/24 1629  Weight: 81.6 kg    Examination: General: Critically ill appearing female, laying in bed, awake, with moderate respiratory distress post cardiac arrest HENT: Atraumatic, normocephalic, neck supple, no JVD Lungs: Coarse breath sounds throughout, even, increased WOB and accessory muscle use Cardiovascular:  Tachycardia with ectopy, s1s2, no M/R/G Abdomen: Obese, soft, nontender, nondistended, no guarding or rebound tenderness, BS+ x4 Extremities: Generalized weakness, 1+ edema BLE Neuro: Awake but confused, able to answer simple questions GU: Deferred  Resolved Hospital Problem list     Assessment & Plan:   #In-Hospital V. Fib Cardiac Arrest #Anterior STEMI #Shock: suspect Cardiogenic  #Hypertensive Emergency ~ RESOLVED #Paroxsymal Atrial Fibrillation  #AAA PMHx: CAD -Continuous cardiac monitoring -Maintain MAP >65 -Cautious IV fluids -Vasopressors as needed to maintain MAP goal -Trend lactic acid until  normalized -Trend HS Troponin until peaked -Check Coox  -Echocardiogram pending -Diuresis as BP and renal function permits ~ holding for now due to shock  -Heparin  gtt & Amiodarone gtt -Cardiology following, appreciate input ~ taken for emergent cath 12/31: LAD 100% stenosed, balloon angioplasty was performed with post intervention there is 90% residual stenosis.  Estimated LVEF 25-35% -Continue Cangrelor ~ once able to take PO will need dual antiplatelet therapy with Aspirin  81 mg and Ticagrelor 90 mg BID for a minimum of 12 months   #Acute Hypoxic & Hypercapnic Respiratory Failure  #Intubated for airway protection and due to hemodynamic instability with cardiac arrest -Full vent support, implement lung protective strategies -Plateau pressures less than 30 cm H20 -Wean FiO2 & PEEP as tolerated to maintain O2 sats >92% -Follow intermittent Chest X-ray & ABG as needed -Spontaneous Breathing Trials when respiratory parameters met and mental status permits -Implement VAP Bundle -Prn Bronchodilators -Diuresis as BP and renal function permits   #Hypokalemia #Anion Gap Metabolic Acidosis due to Lactic Acidosis -Monitor I&O's / urinary output -Follow BMP -Ensure adequate renal perfusion -Avoid nephrotoxic agents as able -Replace electrolytes as indicated ~ Pharmacy following for assistance with electrolyte replacement -Trend lactic   #Anemia #Thrombocytopenia -Monitor for S/Sx of bleeding -Trend CBC -Heparin  gtt for VTE Prophylaxis/anticoagulation  -Transfuse for Hgb <7 -Transfuse Platelets for Platelet count < 10K; < 50K with active bleeding; < 100K with  Neurosurgical procedures/processes   #Hyperglycemia  -CBG's q4h; Target range of 140 to 180 -SSI -Follow ICU Hypo/Hyperglycemia protocol  #Sedation needs in setting of mechanical ventilation -Maintain a RASS goal of 0 to -1 -Fentanyl  and Propofol  to maintain RASS goal -Avoid sedating medications as able -Daily wake up  assessment      Best Practice (right click and Reselect all SmartList Selections daily)   Diet/type: NPO DVT prophylaxis: systemic heparin  GI prophylaxis: PPI Lines: N/A Foley:  N/A Code Status:  full code Last date of multidisciplinary goals of care discussion [N/A]  12/31: Pt and family updated at bedside by Cardiology   Labs   CBC: Recent Labs  Lab 03/10/24 1636 03/11/24 0514 03/12/24 0159 03/13/24 0405  WBC 5.8 5.0 5.4 6.4  HGB 12.9 10.9* 11.3* 11.1*  HCT 39.0 33.7* 33.5* 33.4*  MCV 90.5 91.8 89.3 91.0  PLT 136* 111* 135* 119*    Basic Metabolic Panel: Recent Labs  Lab 03/10/24 1636 03/11/24 0514 03/12/24 0159 03/13/24 0405  NA 137 139 137 136  K 4.0 3.7 4.2 3.7  CL 102 105 103 103  CO2 21* 22 21* 22  GLUCOSE 120* 95 96 94  BUN 13 11 12 13   CREATININE 0.86 0.75 0.86 0.97  CALCIUM  9.9 9.6 9.5 9.7   GFR: Estimated Creatinine Clearance: 42.3 mL/min (by C-G formula based on SCr of 0.97 mg/dL). Recent Labs  Lab 03/10/24 1636 03/11/24 0514 03/12/24 0159 03/13/24 0405  WBC 5.8 5.0 5.4 6.4    Liver Function Tests: No results for input(s): AST, ALT, ALKPHOS, BILITOT, PROT, ALBUMIN in the last 168 hours. No results for input(s): LIPASE, AMYLASE in the last 168 hours. No results for input(s): AMMONIA in the last 168 hours.  ABG No results found for: PHART, PCO2ART, PO2ART, HCO3, TCO2, ACIDBASEDEF, O2SAT   Coagulation Profile: Recent Labs  Lab 03/10/24 2007  INR 1.1    Cardiac Enzymes: No results for input(s): CKTOTAL, CKMB, CKMBINDEX, TROPONINI in the last 168 hours.  HbA1C: Hgb A1c MFr Bld  Date/Time Value Ref Range Status  03/10/2024 04:36 PM 5.5 4.8 - 5.6 % Final    Comment:    (NOTE) Diagnosis of Diabetes The following HbA1c ranges recommended by the American Diabetes Association (ADA) may be used as an aid in the diagnosis of diabetes mellitus.  Hemoglobin             Suggested A1C NGSP%               Diagnosis  <5.7                   Non Diabetic  5.7-6.4                Pre-Diabetic  >6.4                   Diabetic  <7.0                   Glycemic control for                       adults with diabetes.    01/01/2020 02:31 PM 5.6 4.8 - 5.6 % Final    Comment:    (NOTE)         Prediabetes: 5.7 - 6.4         Diabetes: >6.4         Glycemic control for adults with diabetes: <7.0  CBG: Recent Labs  Lab 03/13/24 1232  GLUCAP 136*    Review of Systems:   Unable to assess due to AMS and critical illness   Past Medical History:  She,  has a past medical history of Anxiety, Arthritis, Atrial fibrillation (HCC), Cancer (HCC) (1991), Depression, GERD (gastroesophageal reflux disease), Headache, Hypertension, Hypothyroidism, Thyroid  disease, and Wears dentures.   Surgical History:   Past Surgical History:  Procedure Laterality Date   BOWEL RESECTION  01/17/2018   Procedure: SMALL BOWEL RESECTION;  Surgeon: Jordis Laneta FALCON, MD;  Location: [facility] ORS;  Service: General;;   BREAST SURGERY Bilateral 1991   mastectomy   CATARACT EXTRACTION Bilateral    CHOLECYSTECTOMY     COLONOSCOPY  2013   Dr Gaylyn   CORONARY STENT INTERVENTION N/A 05/06/2021   Procedure: CORONARY STENT INTERVENTION;  Surgeon: Ammon Blunt, MD;  Location: [facility] INVASIVE [facility];  Service: Cardiovascular;  Laterality: N/A;   HALLUX VALGUS AUSTIN Right 06/03/2015   Procedure: HALLUX VALGUS AUSTIN;  Surgeon: Eva Gay, DPM;  Location: [facility];  Service: Podiatry;  Laterality: Right;  WITH POPLITEAL   KNEE ARTHROSCOPY WITH MEDIAL MENISECTOMY Right 08/30/2016   Procedure: KNEE ARTHROSCOPY WITH PARTIAL MEDIAL MENISECTOMY and Partial Lateral Menisectomy;  Surgeon: Kathlynn Sharper, MD;  Location: [facility] ORS;  Service: Orthopedics;  Laterality: Right;   KNEE ARTHROSCOPY WITH SUBCHONDROPLASTY Right 08/30/2016   Procedure: KNEE ARTHROSCOPY WITH SUBCHONDROPLASTY;  Surgeon: Kathlynn Sharper, MD;   Location: [facility] ORS;  Service: Orthopedics;  Laterality: Right;   LAPAROTOMY N/A 01/17/2018   Procedure: EXPLORATORY LAPAROTOMY;  Surgeon: Jordis Laneta FALCON, MD;  Location: [facility] OR[facility];  Service: General;  Laterality: N/A;   LEFT HEART CATH AND CORONARY ANGIOGRAPHY Left 05/06/2021   Procedure: LEFT HEART CATH AND CORONARY ANGIOGRAPHY;  Surgeon: Ammon Blunt, MD;  Location: [facility];  Service: Cardiovascular;  Laterality: Left;   MASTECTOMY Bilateral    ROTATOR CUFF REPAIR Bilateral    TONSILLECTOMY     TOTAL KNEE ARTHROPLASTY Left    TOTAL KNEE ARTHROPLASTY Right 05/30/2017   Procedure: TOTAL KNEE ARTHROPLASTY;  Surgeon: Kathlynn Sharper, MD;  Location: [facility] ORS;  Service: Orthopedics;  Laterality: Right;   VAGINAL HYSTERECTOMY       Social History:   reports that she quit smoking about 35 years ago. Her smoking use included cigarettes. She started smoking about 45 years ago. She has a 2.5 pack-year smoking history. She has been exposed to tobacco smoke. She has never used smokeless tobacco. She reports that she does not drink alcohol and does not use drugs.   Family History:  Her family history includes Healthy in her father; Pulmonary embolism in her mother.   Allergies Allergies[1]   Home Medications  Prior to Admission medications  Medication Sig Start Date End Date Taking? Authorizing Provider  amLODipine  (NORVASC ) 5 MG tablet Take 5 mg by mouth daily. 02/13/23  Yes [provider]  cyanocobalamin  (VITAMIN B12) 1000 MCG tablet Take 1,000 mcg by mouth daily.   Yes [provider]  EUTHYROX  50 MCG tablet Take 50 mcg by mouth daily before breakfast. Dr Cherilyn 10/29/19  Yes [provider]  hydrALAZINE  (APRESOLINE ) 25 MG tablet Take 25 mg by mouth 3 (three) times daily. 01/09/24  Yes [provider]  isosorbide  mononitrate (IMDUR ) 60 MG 24 hr tablet Take 1 tablet (60 mg total) by mouth daily. 08/30/22  Yes Joshua Cathryne BROCKS, MD  omeprazole   (PRILOSEC) 20 MG capsule Take 1 capsule by mouth once daily  03/22/23  Yes Joshua Cathryne BROCKS, MD  potassium chloride  (KLOR-CON ) 10 MEQ tablet Take 20 mEq by mouth at bedtime. 02/19/24 08/17/24 Yes [provider]  tobramycin-dexamethasone  (TOBRADEX) ophthalmic solution Apply 1 drop to eye every 6 (six) hours as needed. 09/26/23  Yes [provider]  Vitamin D, Ergocalciferol, (DRISDOL) 1.25 MG (50000 UNIT) CAPS capsule Take 50,000 Units by mouth every 7 (seven) days. Every wedsnesday   Yes [provider]  ALPRAZolam  (XANAX ) 0.25 MG tablet Take 1 tablet (0.25 mg total) by mouth 2 (two) times daily as needed for anxiety. Patient not taking: Reported on 04/27/2023 11/16/21   Joshua Cathryne BROCKS, MD  benzonatate  (TESSALON  PERLES) 100 MG capsule Take 1 capsule (100 mg total) by mouth 3 (three) times daily as needed for cough. Patient not taking: Reported on 04/27/2023 08/15/22   Joshua Cathryne BROCKS, MD  clopidogrel  (PLAVIX ) 75 MG tablet Take 1 tablet (75 mg total) by mouth daily with breakfast. Patient not taking: Reported on 04/27/2023 08/30/22   Joshua Cathryne BROCKS, MD  metoprolol  succinate (TOPROL -XL) 25 MG 24 hr tablet Take 1 tablet (25 mg total) by mouth daily. Patient not taking: Reported on 04/27/2023 08/30/22   Joshua Cathryne BROCKS, MD  sertraline  (ZOLOFT ) 25 MG tablet Take 1 tablet (25 mg total) by mouth daily. Patient not taking: Reported on 04/27/2023 08/30/22   Joshua Cathryne BROCKS, MD     Critical care time: 55 minutes     Inge Lecher, AGACNP-BC [facility] Prefer epic messenger for cross cover needs If after hours, please call E-link       [1]  Allergies Allergen Reactions   Nitroglycerin  Other (See Comments)    Bottoms out BP to 60's    Nitroglycerin  In D5w Other (See Comments)    Bottoms out BP    Valsartan  Other (See Comments)    Dizziness   Adhesive [Tape] Dermatitis    Can not use plastic tape. Paper tape ok    Amlodipine  Swelling    Throat swelling    Asa [Aspirin ] Itching   Hydralazine  Other (See Comments)    Oliguria (low urine output)   Hydrochlorothiazide  Other (See Comments)    Hypokalemia leading to AFib   Penicillin G Itching    Has patient had a PCN reaction causing immediate rash, facial/tongue/throat swelling, SOB or lightheadedness with hypotension: No Has patient had a PCN reaction causing severe rash involving mucus membranes or skin necrosis: No Has patient had a PCN reaction that required hospitalization: No Has patient had a PCN reaction occurring within the last 10 years: No If all of the above answers are NO, then may proceed with Cephalosporin use.    Hydrocodone -Acetaminophen  Itching and Rash   "

## 2024-03-13 NOTE — Consult Note (Signed)
 PHARMACY - ANTICOAGULATION CONSULT NOTE  Pharmacy Consult for Heparin  Indication: chest pain/ACS  Allergies[1]  Patient Measurements: Height: 5' 5 (165.1 cm) Weight: 81.6 kg (180 lb) IBW/kg (Calculated) : 57 HEPARIN  DW (KG): 74.4  Vital Signs: Temp: 98.1 F (36.7 C) (12/31 0524) Temp Source: Oral (12/31 0524) BP: 147/64 (12/31 0524) Pulse Rate: 58 (12/31 0524)  Labs: Recent Labs    03/10/24 2007 03/11/24 0514 03/11/24 1627 03/12/24 0159 03/12/24 0946 03/13/24 0405  HGB  --  10.9*  --  11.3*  --  11.1*  HCT  --  33.7*  --  33.5*  --  33.4*  PLT  --  111*  --  135*  --  119*  APTT 30  --   --   --   --   --   LABPROT 14.6  --   --   --   --   --   INR 1.1  --   --   --   --   --   HEPARINUNFRC  --  <0.10*   < > 0.43 0.37 0.58  CREATININE  --  0.75  --  0.86  --  0.97   < > = values in this interval not displayed.    Estimated Creatinine Clearance: 42.3 mL/min (by C-G formula based on SCr of 0.97 mg/dL).   Medical History: Past Medical History:  Diagnosis Date   Anxiety    Arthritis    thumbs, hands   Atrial fibrillation (HCC)    Cancer (HCC) 1991   bilateral breast   Depression    GERD (gastroesophageal reflux disease)    Headache    occasional - pinched nerve in neck   Hypertension    Hypothyroidism    Thyroid  disease    Wears dentures    full upper, partial lower    Medications:  No history of chronic anticoagulant use PTA  Assessment: 89 y.o. female with a history of hypertension, atrial fibrillation not on anticoagulation and thoracic aortic aneurysm who is with chest pain, acute onset around 2 PM, severe intensity, and radiating to her neck and to her back. Troponin level of 57. Pharmacy has been consulted to initiate and dose continuous heparin  infusion. Baseline labs have been ordered and are pending.  Goal of Therapy:  Heparin  level 0.3-0.7 units/ml Monitor platelets by anticoagulation protocol: Yes  12/29 0514 HL < 0.1,  subtherapeutic 12/29 1627 HL 0.29, subtherapeutic  12/30 0159 HL 0.43, therapeutic X 1  12/30 0946 HL 0.37  12/31 0405 HL 0.58, therapeutic X 3    Plan:  Heparin  level is therapeutic.  Will continue heparin  infusion at 1250 units/hr.  Recheck heparin  level and CBC on 01/01 with AM labs.    Quintarius Ferns D, PharmD 03/13/2024 6:05 AM           [1]  Allergies Allergen Reactions   Nitroglycerin  Other (See Comments)    Bottoms out BP to 60's    Nitroglycerin  In D5w Other (See Comments)    Bottoms out BP    Valsartan  Other (See Comments)    Dizziness   Adhesive [Tape] Dermatitis    Can not use plastic tape. Paper tape ok    Amlodipine  Swelling    Throat swelling   Asa [Aspirin ] Itching   Hydralazine  Other (See Comments)    Oliguria (low urine output)   Hydrochlorothiazide  Other (See Comments)    Hypokalemia leading to AFib   Penicillin G Itching    Has patient had a PCN  reaction causing immediate rash, facial/tongue/throat swelling, SOB or lightheadedness with hypotension: No Has patient had a PCN reaction causing severe rash involving mucus membranes or skin necrosis: No Has patient had a PCN reaction that required hospitalization: No Has patient had a PCN reaction occurring within the last 10 years: No If all of the above answers are NO, then may proceed with Cephalosporin use.    Hydrocodone -Acetaminophen  Itching and Rash

## 2024-03-13 NOTE — Progress Notes (Signed)
 Pt brought up from 2A to ICU after a code blue was called in the floor. Also code STEMI was called by Dr. Huey. Pt was intubated at bedside by Inge Lecher NP. Levo and propofol  were started. Pt taken to the cath lab for cardiac cath.

## 2024-03-13 NOTE — Progress Notes (Signed)
 EKG done. MD by the bedside updating daughter. Patient jerked and fell back, became unresponsive. Code called.Compresions started immediately, Epi x1 dose given Shocked x1, Amio given.  ACLS protocols followed with ROSC. Patient transferred to ICU.

## 2024-03-13 NOTE — Progress Notes (Signed)
 Pt being tranfers to [employer] unit 7E room 13, RN gave report Donley Bevely PEAK.

## 2024-03-13 NOTE — Discharge Summary (Signed)
 Physician Discharge Summary  Patient ID: Kaitlyn Oliver MRN: [id number] DOB/AGE: 88/09/1933 88 y.o.  Admit date: 03/10/2024 Discharge date: 03/13/2024   Brief Pt Description / Synopsis:  89 y.o. female admitted with Hypertensive Urgency and NSTEMI. Course complicated by anterior STEMI and subsequent V.Fib Cardiac Arrest (approximately 2-3 minutes of ACLS). Emergent Cath showed 100% stenosed mid LAD lesion but do to heavy calcification was only able to be intervened with balloon angioplasty. Pending transfer to [facility] for possible Arthrectomy of the mid to proximal LAD.  Discharge Diagnoses:   In-Hospital V. Fib Cardiac Arrest Anterior STEMI Shock: suspect Cardiogenic Hypertensive Emergency Paroxsymal Atrial Fibrillation AAA Acute Hypoxic & Hypercapnic Respiratory Failure Intubated for airway protection and for hemodynamic instability with cardiac arrest Hypokalemia Anion Gap Metabolic Acidosis Lactic Acidosis Anemia Thrombocytopenia Hyperglycemia                                                            Discharge Summary:  Kaitlyn Oliver is a 89 y.o. 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   Patient is currently intubated, therefore history is take from H&P and chart review.   Patient reported that she has intermittent chest pain for more than 10 days which has worsened today.  The chest pain is located substernal area, constant, initially 10 out of 10 in severity, currently subsided, radiating to the neck.  No cough and SOB.  No fever or chills.  No nausea, vomiting, diarrhea or abdominal pain.  No symptoms UTI.  Patient denies any rectal bleeding or dark  stool.  No recent fall or head injury.    ED Course: Initial Vital Signs: elevated blood pressure 214/114 which improved to 169/93 after giving labetalol  5 mg twice in ED. temperature normal, heart rate 58, RR 20, oxygen  saturation 91-94% on room air.   Her chest pain has also subsided. EKG showed ST elevation in V2. Code STEMI was consulted.  Dr. Florencio evaluated the patient, who does not think pt meets criteria for STEMI.   Significant Labs: Troponin 57 --> 171,   WBC 5.8, GFR> 60  Imaging Chest X-ray>>negative  CTA Chest/Abdomen/Pelvis>>1. Ascending thoracic aortic aneurysm measuring 4.5 x 4.4 cm, increased in size compared to the prior study, without thoracic aortic dissection. Recommend semi-annual imaging follow-up by CTA or MRA and referral to cardiothoracic surgery if not already obtained. This recommendation follows 2010 accf/aha/aats/ACR/asa/sca/scai/sir/sts/svm guidelines (circulation 2010;121:E266-e369). Aortic aneurysm nos (icd10i71.9). 2. New scattered pulmonary nodules within the bilateral lower lobes and upper lobes measuring up to 4 mm, without focal lung consolidation, pleural effusion, or pneumothorax. 3. Diffuse nodular liver contour compatible with cirrhosis. 4. Mild stenosis at the origin of the celiac axis. 5. Mild stranding surrounding the proximal aspect of the inferior mesenteric artery. Findings may be inflammatory. No evidence for occlusion or dissection   Her chest pain has also subsided. EKG showed ST elevation in V2. Code STEMI was consulted.  Dr. Florencio evaluated the patient, who does not think pt meets criteria for STEMI.     Hospitalist asked to  admit for further workup and treatment.   Please see Significant Hospital Events section below for full detailed hospital course.  Significant Events:  12/28: Admitted by Hospitalist for hypertensive urgency and NSTEMI. 12/29: Cardiology consulted. 12/30:  Symptoms felt to be very atypical and associated  with solid food intake, GI consulted to rule out esophageal issues contributing to symptoms. 12/31: Developed chest pain and diaphoresis, EKG with STEMI, suffered brief V. Fib cardiac arrest requiring about 2-3 minutes of ACLS.  Transferred to ICU, intubated. PCCM consulted.  Taken for emergent Cath, only able to balloon lesion.  Now with shock, critically ill. Pending transfer to [facility] for possible Arthrectomy.   Discharge Plan by Diagnosis:   #In-Hospital V. Fib Cardiac Arrest #Anterior STEMI #Shock: suspect Cardiogenic  #Hypertensive Emergency ~ RESOLVED #Paroxsymal Atrial Fibrillation  #AAA PMHx: CAD -Continuous cardiac monitoring -Maintain MAP >65 -Cautious IV fluids -Vasopressors as needed to maintain MAP goal -Trend lactic acid until normalized -Trend HS Troponin until peaked -Check Coox  -Echocardiogram pending -Diuresis as BP and renal function permits ~ holding for now due to shock  -Heparin  gtt & Amiodarone gtt -Cardiology following, appreciate input ~ taken for emergent cath 12/31: LAD 100% stenosed, balloon angioplasty was performed with post intervention there is 90% residual stenosis.  Estimated LVEF 25-35% -Continue Cangrelor ~ once able to take PO will need dual antiplatelet therapy with Aspirin  81 mg and Ticagrelor 90 mg BID for a minimum of 12 months  -Pending transfer to [facility] for possible Arthrectomy    #Acute Hypoxic & Hypercapnic Respiratory Failure  #Intubated for airway protection and due to hemodynamic instability with cardiac arrest -Full vent support, implement lung protective strategies -Plateau pressures less than 30 cm H20 -Wean FiO2 & PEEP as tolerated to maintain O2 sats >92% -Follow intermittent Chest X-ray & ABG as needed -Spontaneous Breathing Trials when respiratory parameters met and mental status permits -Implement VAP Bundle -Prn Bronchodilators -Diuresis as BP and renal function permits    #Hypokalemia #Anion Gap Metabolic Acidosis due to  Lactic Acidosis -Monitor I&O's / urinary output -Follow BMP -Ensure adequate renal perfusion -Avoid nephrotoxic agents as able -Replace electrolytes as indicated ~ Pharmacy following for assistance with electrolyte replacement -Trend lactic    #Anemia #Thrombocytopenia -Monitor for S/Sx of bleeding -Trend CBC -Heparin  gtt for VTE Prophylaxis/anticoagulation  -Transfuse for Hgb <7 -Transfuse Platelets for Platelet count < 10K; < 50K with active bleeding; < 100K with Neurosurgical procedures/processes    #Hyperglycemia  -CBG's q4h; Target range of 140 to 180 -SSI -Follow ICU Hypo/Hyperglycemia protocol   #Sedation needs in setting of mechanical ventilation -Maintain a RASS goal of 0 to -1 -Fentanyl  and Propofol  to maintain RASS goal -Avoid sedating medications as able -Daily wake up assessment  Significant Diagnostic Studies:  12/28: CT angio Chest/Abdomen/Pelvis>>IMPRESSION: 1. Ascending thoracic aortic aneurysm measuring 4.5 x 4.4 cm, increased in size compared to the prior study, without thoracic aortic dissection. Recommend semi-annual imaging follow-up by CTA or MRA and referral to cardiothoracic surgery if not already obtained. This recommendation follows 2010 accf/aha/aats/ACR/asa/sca/scai/sir/sts/svm guidelines (circulation 2010;121:E266-e369). Aortic aneurysm nos ([id number]). 2. New scattered pulmonary nodules within the bilateral lower lobes and upper lobes measuring up to 4 mm, without focal lung consolidation, pleural effusion, or pneumothorax. 3. Diffuse nodular liver contour compatible with cirrhosis. 4. Mild stenosis at the origin of the celiac axis. 5. Mild stranding surrounding the proximal aspect of the inferior mesenteric artery. Findings may be inflammatory. No evidence for occlusion or dissection. 12/29: Echocardiogram>>IMPRESSIONS  1. Left  ventricular ejection fraction, by estimation, is 45 to 50%. The  left ventricle has mildly decreased function.  The left ventricle  demonstrates regional wall motion abnormalities (see scoring  diagram/findings for description). There is mild left  ventricular hypertrophy. Left ventricular diastolic parameters are  consistent with Grade I diastolic dysfunction (impaired relaxation).   2. Right ventricular systolic function is normal. The right ventricular  size is normal.   3. Left atrial size was moderately dilated.   4. The mitral valve is normal in structure. Trivial mitral valve  regurgitation. Moderate mitral annular calcification.   5. Tricuspid valve regurgitation is mild to moderate.   6. The aortic valve is tricuspid. Aortic valve regurgitation is mild.  Aortic valve sclerosis/calcification is present, without any evidence of  aortic stenosis.   7. There is moderate dilatation of the ascending aorta, measuring 46 mm.   8. The inferior vena cava is normal in size with greater than 50%  respiratory variability, suggesting right atrial pressure of 3 mmHg.  12/30: CT Soft Tissue Neck>>IMPRESSION: 1. Ovoid low-attenuation soft tissue nodule in the right supraclavicular region posterior to the sternocleidomastoid muscle, measuring approximately 2.9 x 1.7 x 1.9 cm (HU ~21). Follow up ultrasound of the neck is suggested. 2. Recent extractions of the right lateral incisor and canine without adjacent inflammatory change. 3. Mild emphysematous change in the lung apices. 12/31: Cardiac Cath>>  Mid LAD lesion is 100% stenosed.   Mid LAD to Dist LAD lesion is 70% stenosed.   Mid Cx lesion is 70% stenosed.   Prox RCA-2 lesion is 60% stenosed.   Mid RCA to Dist RCA lesion is 50% stenosed.   Mid LM to Dist LM lesion is 25% stenosed.   Mid Cx to Dist Cx lesion is 90% stenosed.   Prox RCA-1 lesion is 80% stenosed.   Mid RCA lesion is 70% stenosed.   Balloon angioplasty was performed using a BALLOON TAKERU 1.5X12.   Post intervention, there is a 90% residual stenosis.   There is moderate left  ventricular systolic dysfunction.   LV end diastolic pressure is moderately elevated.   The left ventricular ejection fraction is 25-35% by visual estimate.            Micro Data:  N/A  Antimicrobials:   Anti-infectives (From admission, onward)    None        Consults:  Cardiology PCCM  Discharge Exam:  General: Critically ill appearing female, laying in bed, awake, with moderate respiratory distress post cardiac arrest HENT: Atraumatic, normocephalic, neck supple, no JVD Lungs: Coarse breath sounds throughout, even, increased WOB and accessory muscle use Cardiovascular:  Tachycardia with ectopy, s1s2, no M/R/G Abdomen: Obese, soft, nontender, nondistended, no guarding or rebound tenderness, BS+ x4 Extremities: Generalized weakness, 1+ edema BLE Neuro: Awake but confused, able to answer simple questions GU: Deferred  Vitals:   03/13/24 1700 03/13/24 1715 03/13/24 1730 03/13/24 1745  BP: (!) 84/62 (!) 72/51 (!) 70/49 (!) 80/54  Pulse: 63 (!) 56 (!) 53 (!) 51  Resp: 14 16 10 15   Temp:      TempSrc:      SpO2: 98% 100% 100% 100%  Weight:      Height:         Discharge Labs:   BMET Recent Labs  Lab 03/11/24 0514 03/12/24 0159 03/13/24 0405 03/13/24 1324 03/13/24 1644  NA 139 137 136 136 133*  K 3.7 4.2 3.7 2.7* 3.4*  CL 105 103 103 101 97*  CO2  22 21* 22 19* 18*  GLUCOSE 95 96 94 174* 287*  BUN 11 12 13 12 12   CREATININE 0.75 0.86 0.97 1.10* 1.07*  CALCIUM  9.6 9.5 9.7 9.9 9.5  MG  --   --   --  2.2  --     CBC Recent Labs  Lab 03/12/24 0159 03/13/24 0405 03/13/24 1644  HGB 11.3* 11.1* 12.3  HCT 33.5* 33.4* 38.0  WBC 5.4 6.4 16.2*  PLT 135* 119* 206    Anti-Coagulation Recent Labs  Lab 03/10/24 2007 03/13/24 1324  INR 1.1 1.1    Discharge Instructions     AMB Referral to Cardiac Rehabilitation - Phase II   Complete by: As directed    Diagnosis: STEMI   After initial evaluation and assessments completed: Virtual Based Care may be  provided alone or in conjunction with Phase 2 Cardiac Rehab based on patient barriers.: Yes         Follow-up Information     [name], Alexander, MD. Go in 1 week(s).   Specialty: Cardiology Contact information: [address] [facility] [place] [address] [phone]                  Allergies as of 03/13/2024       Reactions   Nitroglycerin  Other (See Comments)   Bottoms out BP to 60's    Nitroglycerin  In D5w Other (See Comments)   Bottoms out BP    Valsartan  Other (See Comments)   Dizziness   Adhesive [tape] Dermatitis   Can not use plastic tape. Paper tape ok    Amlodipine  Swelling   Throat swelling   Asa [aspirin ] Itching   Hydralazine  Other (See Comments)   Oliguria (low urine output)   Hydrochlorothiazide  Other (See Comments)   Hypokalemia leading to AFib   Penicillin G Itching   Has patient had a PCN reaction causing immediate rash, facial/tongue/throat swelling, SOB or lightheadedness with hypotension: No Has patient had a PCN reaction causing severe rash involving mucus membranes or skin necrosis: No Has patient had a PCN reaction that required hospitalization: No Has patient had a PCN reaction occurring within the last 10 years: No If all of the above answers are NO, then may proceed with Cephalosporin use.   Hydrocodone -acetaminophen  Itching, Rash        Medication List     STOP taking these medications    ALPRAZolam  0.25 MG tablet Commonly known as: XANAX    benzonatate  100 MG capsule Commonly known as: Tessalon  Perles   clopidogrel  75 MG tablet Commonly known as: PLAVIX    cyanocobalamin  1000 MCG tablet Commonly known as: VITAMIN B12   hydrALAZINE  25 MG tablet Commonly known as: APRESOLINE    isosorbide  mononitrate 60 MG 24 hr tablet Commonly known as: IMDUR    metoprolol  succinate 25 MG 24 hr tablet Commonly known as: TOPROL -XL   omeprazole  20 MG capsule Commonly known as: PRILOSEC    potassium chloride  10 MEQ tablet Commonly known as: KLOR-CON    sertraline  25 MG tablet Commonly known as: ZOLOFT    tobramycin-dexamethasone  ophthalmic solution Commonly known as: TOBRADEX   Vitamin D (Ergocalciferol) 1.25 MG (50000 UNIT) Caps capsule Commonly known as: DRISDOL       TAKE these medications    acetaminophen  325 MG tablet Commonly known as: TYLENOL  Take 2 tablets (650 mg total) by mouth every 6 (six) hours as needed for mild pain (pain score 1-3) or fever.   amiodarone 360-4.14 MG/200ML-% Soln Commonly known as: NEXTERONE PREMIX Inject 60  mg/hr into the vein continuous.   amLODipine  10 MG tablet Commonly known as: NORVASC  Take 1 tablet (10 mg total) by mouth daily. Start taking on: March 14, 2024 What changed:  medication strength how much to take   atorvastatin  40 MG tablet Commonly known as: LIPITOR Take 1 tablet (40 mg total) by mouth daily. Start taking on: March 14, 2024   docusate 50 MG/5ML liquid Commonly known as: COLACE Place 10 mLs (100 mg total) into feeding tube 2 (two) times daily.   enalaprilat  1.25 MG/ML injection Commonly known as: VASOTEC Inject 0.5 mLs (0.625 mg total) into the vein every 2 (two) hours as needed (for BP> 165).   fentaNYL  10 mcg/ml Soln infusion Inject 0-400 mcg/hr into the vein continuous.   fentaNYL  50 MCG/ML Sosy injection Commonly known as: SUBLIMAZE  Inject 1 mL (50 mcg total) into the vein every 15 (fifteen) minutes as needed (to achieve RASS & CPOT goal.).   fentaNYL  50 MCG/ML Sosy injection Commonly known as: SUBLIMAZE  Inject 1-4 mLs (50-200 mcg total) into the vein every 30 (thirty) minutes as needed (to maintain RASS & CPOT goal.).   fentaNYL  Soln Commonly known as: SUBLIMAZE  Inject 25-100 mcg into the vein every 15 (fifteen) minutes as needed (to maintain RASS & CPOT goal.).   levothyroxine  50 MCG tablet Commonly known as: Euthyrox  Take 1 tablet (50 mcg total) by mouth daily before  breakfast. Start taking on: March 14, 2024 What changed: additional instructions   midazolam -sodium chloride  100-0.9 MG/100ML-% Soln Inject 0.5-10 mg/hr into the vein continuous.   norepinephrine 4-5 MG/250ML-% Soln Commonly known as: LEVOPHED Inject 0-10 mcg/min into the vein continuous.   norepinephrine 16-5 MG/250ML-% Soln Commonly known as: LEVOPHED Inject 0-40 mcg/min into the vein continuous.   ondansetron  4 MG/2ML Soln injection Commonly known as: ZOFRAN  Inject 2 mLs (4 mg total) into the vein every 8 (eight) hours as needed for nausea or vomiting.   pantoprazole  40 MG injection Commonly known as: PROTONIX  Inject 40 mg into the vein every 12 (twelve) hours.   polyethylene glycol 17 g packet Commonly known as: MIRALAX / GLYCOLAX Place 17 g into feeding tube daily. Start taking on: March 14, 2024   propofol  1000 MG/100ML Emul injection Commonly known as: DIPRIVAN  Inject 0-6,528 mcg/min into the vein continuous.   ranolazine  500 MG 12 hr tablet Commonly known as: RANEXA  Take 1 tablet (500 mg total) by mouth 2 (two) times daily.   sodium chloride  0.9 % infusion Inject 250 mLs into the vein continuous.   sodium chloride  flush 0.9 % Soln Commonly known as: NS 10-40 mLs by Intracatheter route every 12 (twelve) hours.   sodium chloride  flush 0.9 % Soln Commonly known as: NS 10-40 mLs by Intracatheter route as needed (flush).   ticagrelor 90 MG Tabs tablet Commonly known as: BRILINTA Take 1 tablet (90 mg total) by mouth 2 (two) times daily. Start taking on: March 14, 2024   vasopressin 20 units/100 mL Soln Inject 0-0.03 Units/min into the vein continuous.            Disposition: Cardiac ICU  Discharged Condition: Jake Zyah Gomm has met maximum benefit of inpatient care and requires transfer to [facility] for higher level of cardiac care.      Time spent on disposition:  50 Minutes.     Signed: Inge Lecher, AGACNP-BC [facility] Prefer epic messenger for cross cover needs If after hours, please call E-link

## 2024-03-13 NOTE — Significant Event (Signed)
 Rapid Response Event Note   Reason for Call :  Patient having altered mental status  Initial Focused Assessment:   Bedside RN stated that patient having altered mental status- patient responding as people are talking to her- vitals stable.  Patient has left facial droop- which Dr. Awanda stated was not new.  Patient stated that she felt like she is having an allergic reaction to the lidocaine  she received- 45 min earlier for sore throat pain.     Interventions:  Dr. Awanda at bedside.  She ordered for solumedrol.  Plan of Care:     Event Summary:   MD Notified:  Call Time: Arrival Time: End Time:12:45  Allena JINNY Gunther, RN

## 2024-03-13 NOTE — Progress Notes (Signed)
 Nursing handoff received from [place], [place]. At the time of change of shift, [facility] is at the patient bedside preparing patient for transport/discharge. The patient left the unit with [facility] at 1955 hours.

## 2024-03-13 NOTE — Care Management Important Message (Signed)
 Important Message  Patient Details  Name: Kaitlyn Oliver MRN: [id number] Date of Birth: Apr 18, 1933   Important Message Given:  Yes - Medicare IM     Rojelio SHAUNNA Rattler 03/13/2024, 2:42 PM

## 2024-03-13 NOTE — Progress Notes (Signed)
 Cardiac Catheterization Pre-Procedure  I have reviewed the medical and surgical history, family history, review of systems, date of last menses (if applicable), current medications, allergies and sensitivities, physical examination, laboratory and diagnostic data reviewed and recorded on the H&P form.    Additional pertinent or relevant data: Kaitlyn Oliver is a 89 y.o. female with PMH of CAD (history of POBA to heavily calcified mid LAD 2023), ascending aortic aneurysm, PAF (not on anticoagulation), HTN, HLD, peptic ulcer disease, admitted to [facility] with NSTEMI c/b VF arrest s/p balloon angioplasty to LAD. IABP requested for cardiogenic shock.  Plan for procedure: cardiac catheterization Alternative options: no catheterization The procedures are indicated to evaluate and/or treat (diagnosis): cardiogenic shock  Plan for moderate sedation: Moderate sedation indicated to provide moderate sedation and analgesia during the invasive procedure. Adverse experiences with sedation / analgesia reviewed. The patient is an appropriate candidate for moderate sedation. Planned sedation may include: Diphenhydramine  (Benadryl ), Diazepam (Valium), Midazolam  (Versed ), Hydromorphone  (Dilaudid ), and Fentanyl  (Sublimaze )  ASA Classification: IV - Severe Symptomatic disease  Mallampati Classification: II (hard and soft palate, upper portion of tonsils and uvula visible)  Plan for blood product transfusion: transfusion not anticipated  The patient was interviewed and examined, and the available chart reviewed.  The indications, procedure, risk, potential complications (as listed on the respective consent forms), and alternatives for both the procedure and for moderate sedation were explained and discussed.  The patient understands and has signed the informed consent for the procedure and the informed consent for moderate sedation.  Plan for post-procedure care needs: Intensive care unit  Additional diagnostic  data needed: n/a  Additional physical, mental, neurologic status considerations and needs: n/a  Other notes: n/a

## 2024-03-13 NOTE — Progress Notes (Signed)
 " PROGRESS NOTE    Kaitlyn Oliver  FMW:[id number] DOB: 07/21/1933 DOA: 03/10/2024 PCP: Kaitlyn Amato, MD  [id number]/[id number]-[id number]  LOS: 2 days   Brief hospital course:   Assessment & Plan: Kaitlyn Oliver is a 89 y.o. 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    Patient states that she has intermittent chest pain for more than 10 days which has worsened   Cardiac Arrest 2/2 STEMI --regained ROSC after brief CPR.   --transfer to ICU for intubation --emergent cath   Hypertensive urgency:   Blood pressure 214/114, responding to treatment, improved to SBP 140-150. Pt received labetalol  5 mg x 2.  Started as needed enalaprilate and increased the dose of hydralazine  and amlodipine .  NSTEMI (non-ST elevated myocardial infarction) (HCC) and hx of CAD (coronary artery disease):  trop 57 --> 171.  Cardio consulted. --pt has known obstruction that was unable to be stented during last heart cath. --cont heparin  gtt  Hypothyroidism --cont Synthroid    Thoracoabdominal aortic aneurysm (TAAA):  CTA showed ascending thoracic aortic aneurysm measuring 4.5 x 4.4 cm, increased in size compared to the prior study, without thoracic aortic dissection.  -will need semi-annual imaging follow-up by CTA or MRA and referral to cardiothoracic surgery.  Pt and family aware.   PAF (paroxysmal atrial fibrillation) (HCC):  pt is not taking AC.  Heart rate in 50s   Thrombocytopenia:  This is chronic issue, platelets are 136 -Follow-up with CBC   Pulm nodules --incidental findings by CTA. - Need to follow-up with PCP as outpatient.  Pt and family made aware.   Overweight (BMI 25.0-29.9): Body weight 85.6 kg, BMI 29.95. -  Encourage losing weight - Exercise healthy diet   Cirrhosis --Diffuse nodular liver contour compatible with cirrhosis.  Pt and family aware.  Bilateral neck pain --pt reported bilateral neck pain AFTER swallowing solid food, not during. --GI consulted by cardio --esophagram and CT neck did not show etiology of pt's neck pain. --given current STEMI, likely a symptom of ACS.   DVT prophylaxis: On:heparin  gtt Code Status: Full code  Family Communication:  Level of care: ICU Dispo:   The patient is from: home Anticipated d/c is to: to be determined Anticipated d/c date is: to be determined   Subjective and Interval History:  Rapid response called due to pt not feeling well and diaphoretic.  Vitals stable, tele remarkable.  Pt denied chest pain, but said earlier she received viscous lidocaine  orally and believed she had a reaction to the lidocaine .  Pt was ordered IV solumedrol and troponin.  Cardiology notified who came to evaluate pt after the rapid response, and as EKG was being obtained, it showed STEMI.  Pt quickly went unresponsive and pulseless, so code blue initiated.  After brief CPR, pt regained ROSC.  Pt was transferred to ICU and intubated.   Objective: Vitals:   03/13/24 1815 03/13/24 1830 03/13/24 1845 03/13/24 1900  BP: (!) 159/88 (!) 143/75 (!) 141/79 139/76  Pulse: 68 (!) 59 (!) 58 (!) 53  Resp: 18 15 (!) 21 20  Temp:    (!) 93 F (33.9 C)  TempSrc:    Axillary  SpO2: 99% 100% 100% 96%  Weight:      Height:       No intake or  output data in the 24 hours ending 03/25/24 0010  Filed Weights   03/10/24 1629  Weight: 81.6 kg    Examination: before code blue  Constitutional: in mild distress, alert, oriented HEENT: conjunctivae and lids normal, EOMI CV: No cyanosis.   RESP: normal respiratory effort Psych: subdued mood and affect.     Data Reviewed: I have personally reviewed labs and imaging studies  Time spent: 50 minutes, critical care  time  Kaitlyn Haber, MD [facility] If 7PM-7AM, please contact night-coverage 03/25/2024, 12:10 AM   "

## 2024-03-13 NOTE — Consult Note (Signed)
 PHARMACY - ANTICOAGULATION CONSULT NOTE  Pharmacy Consult for Heparin  Indication: chest pain/ACS  Allergies[1]  Patient Measurements: Height: 5' 5 (165.1 cm) Weight: 81.6 kg (180 lb) IBW/kg (Calculated) : 57 HEPARIN  DW (KG): 74.4  Vital Signs: Temp: 98.1 F (36.7 C) 19-Mar-2025 0736) Temp Source: Oral March 19, 2025 0736) BP: 146/86 03/19/25 1247) Pulse Rate: 62 03/19/2025 1247)  Labs: Recent Labs    03/10/24 2007 03/11/24 0514 03/11/24 1627 03/12/24 0159 03/12/24 0946 2024/03/19 0405  HGB  --  10.9*  --  11.3*  --  11.1*  HCT  --  33.7*  --  33.5*  --  33.4*  PLT  --  111*  --  135*  --  119*  APTT 30  --   --   --   --   --   LABPROT 14.6  --   --   --   --   --   INR 1.1  --   --   --   --   --   HEPARINUNFRC  --  <0.10*   < > 0.43 0.37 0.58  CREATININE  --  0.75  --  0.86  --  0.97   < > = values in this interval not displayed.    Estimated Creatinine Clearance: 42.3 mL/min (by C-G formula based on SCr of 0.97 mg/dL).   Medical History: Past Medical History:  Diagnosis Date   Anxiety    Arthritis    thumbs, hands   Atrial fibrillation (HCC)    Cancer (HCC) 1991   bilateral breast   Depression    GERD (gastroesophageal reflux disease)    Headache    occasional - pinched nerve in neck   Hypertension    Hypothyroidism    Thyroid  disease    Wears dentures    full upper, partial lower    Medications:  No history of chronic anticoagulant use PTA  Assessment: 89 y.o. female with a history of hypertension, atrial fibrillation not on anticoagulation and thoracic aortic aneurysm who is with chest pain. Troponin level of 57. Cardiology reviewed pharmacy and no chest and plan for medical management. Patient had a rapid March 19, 2025 and coded, EKG showed STEMI. Per cards restarted heparin  infusion. Pharmacy has been consulted to re-initiate  heparin  infusion. Plt trending down, hgb stable.    Goal of Therapy:  Heparin  level 0.3-0.7 units/ml Monitor platelets by anticoagulation  protocol: Yes  12/29 0514 HL < 0.1, subtherapeutic 12/29 1627 HL 0.29, subtherapeutic  12/30 0159 HL 0.43, therapeutic X 1  12/30 0946 HL 0.37  03/19/25 0405 HL 0.58, therapeutic X 3    Plan:  NO initial bolus. Pt will get heparin  boluses in the cath lab if she goes. Restart heparin  infusion at 1250 units/hr. Check heparin  level in 8 hours. CBC daily while on heparin . Trop pending.    Cathaleen GORMAN Blanch, PharmD 2024-03-19 1:24 PM            [1]  Allergies Allergen Reactions   Nitroglycerin  Other (See Comments)    Bottoms out BP to 60's    Nitroglycerin  In D5w Other (See Comments)    Bottoms out BP    Valsartan  Other (See Comments)    Dizziness   Adhesive [Tape] Dermatitis    Can not use plastic tape. Paper tape ok    Amlodipine  Swelling    Throat swelling   Asa [Aspirin ] Itching   Hydralazine  Other (See Comments)    Oliguria (low urine output)   Hydrochlorothiazide  Other (See Comments)  Hypokalemia leading to AFib   Penicillin G Itching    Has patient had a PCN reaction causing immediate rash, facial/tongue/throat swelling, SOB or lightheadedness with hypotension: No Has patient had a PCN reaction causing severe rash involving mucus membranes or skin necrosis: No Has patient had a PCN reaction that required hospitalization: No Has patient had a PCN reaction occurring within the last 10 years: No If all of the above answers are NO, then may proceed with Cephalosporin use.    Hydrocodone -Acetaminophen  Itching and Rash

## 2024-03-13 NOTE — Procedures (Signed)
 Intubation Procedure Note  Kaitlyn Oliver  [id number]  December 15, 1933  Date:03/13/2024  Time:2:01 PM   Provider Performing:Leydy Worthey D Shellia    Procedure: Intubation (31500)  Indication(s) Respiratory Failure  Consent Unable to obtain consent due to emergent nature of procedure.   Anesthesia Etomidate, Fentanyl , and Rocuronium    Time Out Verified patient identification, verified procedure, site/side was marked, verified correct patient position, special equipment/implants available, medications/allergies/relevant history reviewed, required imaging and test results available.   Sterile Technique Usual hand hygeine, masks, and gloves were used   Procedure Description Patient positioned in bed supine.  Sedation given as noted above.  Patient was intubated with endotracheal tube using Glidescope.  View was Grade 1 full glottis .  Number of attempts was 1.  Colorimetric CO2 detector was consistent with tracheal placement.   Complications/Tolerance None; patient tolerated the procedure well. Chest X-ray is ordered to verify placement.   EBL Minimal   Specimen(s) None   Size 7.5 ETT Tube secured at 23 cm at lip    Inge Shellia, AGACNP-BC [facility] Prefer epic messenger for cross cover needs If after hours, please call E-link

## 2024-03-13 NOTE — Progress Notes (Addendum)
" °   03/13/24 1300  Spiritual Encounters  Type of Visit Initial  Conversation partners present during encounter Nurse;Physician  Referral source Code page  Reason for visit Code  OnCall Visit Yes   Chaplain responded to a Code Blue page to room 240B.  Patient's daughter was sitting on the hallway bench crying.  Chaplain offered a compassionate presence as additional family arrived.  Patient listened while staff explained things related to patient's care and assisted moving the patient's belonging to ICU.  Chaplain set up family in the waiting room of ICU and provided beverages and support.  Chaplain remains available for support.    Rev. Rana M. Nicholaus, M.Div. Chaplain Resident [facility] "

## 2024-03-13 NOTE — Progress Notes (Signed)
" °   03/13/24 1240  Spiritual Encounters  Type of Visit Initial  Care provided to: Pt and family  Conversation partners present during encounter Nurse;Physician  Referral source Code page  Reason for visit Code  OnCall Visit Yes  Interventions  Spiritual Care Interventions Made Established relationship of care and support;Compassionate presence;Prayer  Intervention Outcomes  Outcomes Connection to spiritual care;Awareness around self/spiritual resourses;Awareness of support  Spiritual Care Plan  Spiritual Care Issues Still Outstanding No further spiritual care needs at this time (see row info)   Chaplain responded to rapid response to find pt's daughter, Margo, in the hallway. Chaplain comforted daughter. Chaplain then asked if there was anything she could do for pt and the pt said pray. Chaplain prayed and let pt and Delilah know that chaplain services is available 24/7 "

## 2024-03-13 NOTE — Progress Notes (Signed)
 RN notice a small hematoma under TR band. RN notified Dr Florencio and Dr. Malka. Per MD RN to inflate TR band back to 6cc.

## 2024-03-13 NOTE — Progress Notes (Signed)
 Ambulated with NT to the bath room, Had a BM and repositioned to reclining chair. Granddaughter by the bedside.

## 2024-03-13 NOTE — Progress Notes (Addendum)
 Patient was transported to and from cath lab on ventilator with no complications.  RT stayed to monitor ventilator during procedure.  Vital signs and O2 saturation remained stable throughout.

## 2024-03-13 NOTE — Procedures (Signed)
 Central Venous Catheter Insertion Procedure Note  Adreonna Annie Bradshaw  [id number]  05/31/1933  Date:03/13/2024  Time:4:31 PM   Provider Performing:Jean-Pierre Yomaris Palecek   Procedure: Insertion of Non-tunneled Central Venous Catheter(36556) with US  guidance (23062)   Indication(s) Medication administration  Consent Unable to obtain consent due to emergent nature of procedure.  Anesthesia Topical only with 1% lidocaine    Timeout Verified patient identification, verified procedure, site/side was marked, verified correct patient position, special equipment/implants available, medications/allergies/relevant history reviewed, required imaging and test results available.  Sterile Technique Maximal sterile technique including full sterile barrier drape, hand hygiene, sterile gown, sterile gloves, mask, hair covering, sterile ultrasound probe cover (if used).  Procedure Description Area of catheter insertion was cleaned with chlorhexidine  and draped in sterile fashion.  With real-time ultrasound guidance a central venous catheter was placed into the left internal jugular vein. Nonpulsatile blood flow and easy flushing noted in all ports.  The catheter was sutured in place and sterile dressing applied.  Complications/Tolerance None; patient tolerated the procedure well. Chest X-ray is ordered to verify placement for internal jugular or subclavian cannulation.   Chest x-ray is not ordered for femoral cannulation.  EBL Minimal  Specimen(s) None   Darrin Barn, MD [facility] 03/13/2024 4:31 PM

## 2024-03-13 NOTE — Progress Notes (Addendum)
 RN give report to Belvie Gamer from [facility].  Pt being transported to [facility]. Family was notified by Dr. Malka.

## 2024-03-14 NOTE — Progress Notes (Signed)
 Cardiac Catheterization Pre-Procedure  I have reviewed the medical and surgical history, family history, review of systems, date of last menses (if applicable), current medications, allergies and sensitivities, physical examination, laboratory and diagnostic data reviewed and recorded on the H&P form.    Additional pertinent or relevant data: Kaitlyn Oliver is a 89 y.o. female with PMH of CAD (history of POBA to heavily calcified mid LAD 2023), ascending aortic aneurysm, PAF (not on anticoagulation), HTN, HLD, peptic ulcer disease, admitted to [facility] with NSTEMI c/b VF arrest s/p balloon angioplasty to LAD. She underwent IABP assisted PCI of LAD with atherectomy on 12/31.  Developed worsening of ST elevation in anterior leads with chest pain following extubation.  Repeat coronary angiogram requested.  Plan for procedure: cardiac catheterization Alternative options: no catheterization The procedures are indicated to evaluate and/or treat (diagnosis): CAD  Plan for moderate sedation: Moderate sedation indicated to provide moderate sedation and analgesia during the invasive procedure. Adverse experiences with sedation / analgesia reviewed. The patient is an appropriate candidate for moderate sedation. Planned sedation may include: Diphenhydramine  (Benadryl ), Diazepam (Valium), Midazolam  (Versed ), Hydromorphone  (Dilaudid ), and Fentanyl  (Sublimaze )  ASA Classification: IV - Severe Symptomatic disease  Mallampati Classification: II (hard and soft palate, upper portion of tonsils and uvula visible)  Plan for blood product transfusion: transfusion not anticipated  The patient was interviewed and examined, and the available chart reviewed.  The indications, procedure, risk, potential complications (as listed on the respective consent forms), and alternatives for both the procedure and for moderate sedation were explained and discussed.  The patient understands and has signed the informed consent for the  procedure and the informed consent for moderate sedation.  Plan for post-procedure care needs: Intensive care unit  Additional diagnostic data needed: n/a  Additional physical, mental, neurologic status considerations and needs: n/a  Other notes: n/a

## 2024-03-14 NOTE — Procedures (Signed)
 CICU - Pulmonary Artery Catheter Insertion Note Date of service: 03/14/2024 01:00  Consent obtained? yes Time-out? (including correct patient, side, site, procedure, position, equipment): yes Status of procedure:  elective Indication: hemodynamic instability The benefits, risks and possible complications of the procedure were considered and communicated to patient/patient's family, consent was implied based on universal ICU or operative consent.   Prep:  Insertion site prepped with chlorhexidine . Operator donned cap & mask, cleaned hands, then donned sterile gown and sterile gloves. Full body sterile patient drape applied.. The Swan-Ganz catheter was tested for balloon integrity and then inserted into the previously inserted right internal jugular introducer.  At approximately 20cm the balloon was inflated and slowly advanced. Appropriate wave forms were visualized for right atrium.  The patient had a PA pressure of 30/21 mmHg, pulmonary wedge pressure of 18 mmHg, central venous pressure of 14 mmHg.  The balloon was deflated after successful deployment and ensuring position in the pulmonary artery.  A sterile dressing was applied. Location: right internal jugular vein Line depth:  50 cm.       Fluoroscopy used? no Immediate complications: none Dressing: occlusive Lumens connected to pressure monitor? yes Follow Up CXR : Yes LESLIE BEER, NP

## 2024-03-14 NOTE — Procedures (Signed)
 CICU - Central Venous Catheter Insertion Note Date of service: 03/14/2024 01:00  Line type: cordis Consent obtained? yes Time-out? (including correct patient, side, site, procedure, position, equipment): yes Status of procedure:  urgent Indication: hemodynamic instability/monitoring Prep:  Insertion site prepped with chlorhexidine . Operator donned cap & mask, cleaned hands, then donned sterile gown and sterile gloves. Full body sterile patient drape applied.. Anesthesia/Analgesia: Lidocaine  1% 10 ml Location: right internal jugular vein Technique: new puncture Line depth:  10 cm.      Catheter size:  9 Fr.  Ultrasound used? yes Immediate complications: none Dressing: occlusive Needleless connector to each lumen? yes Follow Up CXR : Yes LESLIE BEER, NP 03/14/2024 3:21 AM

## 2024-03-19 NOTE — Progress Notes (Signed)
 Received transfer in from CICU. Pt is AxO x4, VSS, NSR/SB on tele. No c/p of pain. Call bell within reach, no falls this shift. No other needs expressed at this time.

## 2024-03-22 NOTE — Care Plan (Signed)
" °  Problem: Safety Goal: Free from accidental physical injury Outcome: Progressing Goal: Free from abuse Outcome: Progressing   Problem: Daily Care Goal: Daily care needs are met Description: Assess and monitor ability to perform self care and identify potential discharge needs. Outcome: Progressing   Problem: Pain Goal: Patient's pain/discomfort is manageable Description: Assess and monitor patient's pain using appropriate pain scale. Collaborate with interdisciplinary team and initiate plan and interventions as ordered. Re-assess patient's pain level 30 - 60 minutes after pain management intervention.  Outcome: Progressing   Problem: Compromised Skin Integrity Goal: Skin integrity is maintained or improved Description: Assess and monitor skin integrity. Identify patients at risk for skin breakdown on admission and per policy. Collaborate with interdisciplinary team and initiate plans and interventions as needed. Outcome: Progressing Goal: Fluid and electrolyte balance are achieved/maintained Description: Assess and monitor vital signs (orthostatic vitals if applicable), fluid intake and output, urine color, labs, skin turgor, mucous membranes, jugular venous distention, edema, circumference of edematous extremities and abdominal girth, respiratory status, and mental status.  Monitor for signs and symptoms of hypovolemia (tachycardia, rapid breathing, decreased urine output, postural hypotension, confusion, syncope).  Monitor for signs and symptoms of hypervolemia (strong rapid pulse, shortness of breath, difficulty breathing lying down, crackles heard in lung fields, edema). Collaborate with interdisciplinary team and initiate plan and interventions as ordered. Outcome: Progressing Goal: Nutritional status is improving Description: Monitor and assess patient for malnutrition (ex- brittle hair, bruises, dry skin, pale skin and conjunctiva, muscle wasting, smooth red tongue, and  disorientation). Collaborate with interdisciplinary team and initiate plan and interventions as ordered.  Monitor patient's weight and dietary intake as ordered or per policy. Utilize nutrition screening tool and intervene per policy. Determine patient's food preferences and provide high-protein, high-caloric foods as appropriate.  Outcome: Progressing   Problem: Knowledge Deficit Goal: Patient/family/caregiver demonstrates understanding of disease process, treatment plan, medications, and discharge instructions Description: Complete learning assessment and assess knowledge base. Outcome: Progressing   Problem: Discharge Barriers Goal: Patient's discharge needs are met Description: Collaborate with interdisciplinary team and initiate plans and interventions as needed.  Outcome: Progressing   Problem: Alteration in Hemodynamic Stability: Goal: Ability to  maintain  vital signs within normal range will improve Outcome: Progressing Goal: Ability to maintain absence of bleeding will  improve. Outcome: Progressing Goal: Ability to  maintain  a body temperature in the normal range will improve Outcome: Progressing   Problem: Inadequate Cardiac Output: Goal: Ability to maintain an adequate cardiac output will stabilize. Outcome: Progressing Goal: Patient's appetite will improve. Outcome: Progressing Goal: Ability to achieve and maintain adequate urine output. Outcome: Progressing Goal: Hemodynamic stability will improve on medication and/or mechanical therapies. Outcome: Progressing Goal: Ability to maintain adequate ventilation will improve. Outcome: Progressing Goal: Changes in cognition. Outcome: Progressing Goal: Risk for activity intolerance while performing activities of daily living will improve throughout hospitalization. Outcome: Progressing Goal: Patient goals and wishes will be incorporated in the plan of care throughout the continuum of illness. Outcome: Progressing   "
